# Patient Record
Sex: Male | Born: 1960 | Race: White | Hispanic: No | Smoking: Never smoker
Health system: Southern US, Community
[De-identification: ages and names within clinical notes are randomized; demographics above are authoritative.]

## PROBLEM LIST (undated history)

## (undated) DIAGNOSIS — J189 Pneumonia, unspecified organism: Secondary | ICD-10-CM

## (undated) DIAGNOSIS — Z930 Tracheostomy status: Secondary | ICD-10-CM

## (undated) DIAGNOSIS — E349 Endocrine disorder, unspecified: Secondary | ICD-10-CM

## (undated) DIAGNOSIS — N200 Calculus of kidney: Secondary | ICD-10-CM

## (undated) DIAGNOSIS — I480 Paroxysmal atrial fibrillation: Secondary | ICD-10-CM

## (undated) DIAGNOSIS — N529 Male erectile dysfunction, unspecified: Secondary | ICD-10-CM

## (undated) DIAGNOSIS — D649 Anemia, unspecified: Secondary | ICD-10-CM

## (undated) DIAGNOSIS — M109 Gout, unspecified: Secondary | ICD-10-CM

## (undated) DIAGNOSIS — R591 Generalized enlarged lymph nodes: Secondary | ICD-10-CM

## (undated) DIAGNOSIS — G4733 Obstructive sleep apnea (adult) (pediatric): Secondary | ICD-10-CM

## (undated) HISTORY — DX: Morbid (severe) obesity due to excess calories: E66.01

## (undated) HISTORY — DX: Generalized enlarged lymph nodes: R59.1

## (undated) HISTORY — PX: TONSILLECTOMY: SHX5217

## (undated) HISTORY — DX: Paroxysmal atrial fibrillation: I48.0

## (undated) HISTORY — DX: Pneumonia, unspecified organism: J18.9

## (undated) HISTORY — DX: Tracheostomy status: Z93.0

## (undated) HISTORY — DX: Male erectile dysfunction, unspecified: N52.9

## (undated) HISTORY — DX: Anemia, unspecified: D64.9

## (undated) HISTORY — DX: Obstructive sleep apnea (adult) (pediatric): G47.33

## (undated) HISTORY — DX: Gout, unspecified: M10.9

## (undated) HISTORY — PX: FINGER SURGERY: SHX640

## (undated) HISTORY — DX: Endocrine disorder, unspecified: E34.9

## (undated) HISTORY — PX: CYSTOSCOPY: SUR368

## (undated) HISTORY — PX: CHOLECYSTECTOMY: SHX55

## (undated) HISTORY — DX: Calculus of kidney: N20.0

---
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
## 1999-07-03 ENCOUNTER — Encounter: Admission: RE | Admit: 1999-07-03 | Discharge: 1999-10-01 | Payer: Self-pay | Admitting: *Deleted

## 1999-11-04 ENCOUNTER — Encounter: Payer: Self-pay | Admitting: Emergency Medicine

## 1999-11-04 ENCOUNTER — Emergency Department (HOSPITAL_COMMUNITY): Admission: EM | Admit: 1999-11-04 | Discharge: 1999-11-04 | Payer: Self-pay | Admitting: Emergency Medicine

## 2001-07-17 ENCOUNTER — Inpatient Hospital Stay (HOSPITAL_COMMUNITY): Admission: EM | Admit: 2001-07-17 | Discharge: 2001-07-27 | Payer: Self-pay | Admitting: Urology

## 2001-07-17 ENCOUNTER — Encounter: Payer: Self-pay | Admitting: Emergency Medicine

## 2001-07-17 ENCOUNTER — Encounter: Payer: Self-pay | Admitting: Urology

## 2001-07-18 ENCOUNTER — Encounter: Payer: Self-pay | Admitting: Urology

## 2001-07-21 ENCOUNTER — Encounter: Payer: Self-pay | Admitting: Urology

## 2001-07-22 ENCOUNTER — Encounter: Payer: Self-pay | Admitting: Urology

## 2001-07-23 ENCOUNTER — Encounter: Payer: Self-pay | Admitting: Urology

## 2001-07-24 ENCOUNTER — Encounter: Payer: Self-pay | Admitting: Pulmonary Disease

## 2001-07-25 ENCOUNTER — Encounter: Payer: Self-pay | Admitting: Pulmonary Disease

## 2002-05-31 ENCOUNTER — Emergency Department (HOSPITAL_COMMUNITY): Admission: EM | Admit: 2002-05-31 | Discharge: 2002-05-31 | Payer: Self-pay | Admitting: Emergency Medicine

## 2002-06-14 ENCOUNTER — Ambulatory Visit (HOSPITAL_BASED_OUTPATIENT_CLINIC_OR_DEPARTMENT_OTHER): Admission: RE | Admit: 2002-06-14 | Discharge: 2002-06-14 | Payer: Self-pay | Admitting: *Deleted

## 2003-04-09 HISTORY — PX: GASTRIC BYPASS: SHX52

## 2007-05-29 ENCOUNTER — Ambulatory Visit: Payer: Self-pay | Admitting: Family Medicine

## 2007-05-29 DIAGNOSIS — Z9884 Bariatric surgery status: Secondary | ICD-10-CM | POA: Insufficient documentation

## 2007-05-29 DIAGNOSIS — G4730 Sleep apnea, unspecified: Secondary | ICD-10-CM | POA: Insufficient documentation

## 2007-11-08 ENCOUNTER — Encounter: Payer: Self-pay | Admitting: Family Medicine

## 2007-11-19 ENCOUNTER — Telehealth: Payer: Self-pay | Admitting: Family Medicine

## 2007-11-25 ENCOUNTER — Encounter: Payer: Self-pay | Admitting: Family Medicine

## 2007-11-25 LAB — CONVERTED CEMR LAB
ALT: 16 units/L (ref 0–53)
AST: 15 units/L (ref 0–37)
Albumin: 4.2 g/dL (ref 3.5–5.2)
Alkaline Phosphatase: 90 units/L (ref 39–117)
BUN: 18 mg/dL (ref 6–23)
Basophils Absolute: 0 10*3/uL (ref 0.0–0.1)
Basophils Relative: 0 % (ref 0–1)
CO2: 23 meq/L (ref 19–32)
Calcium: 8.8 mg/dL (ref 8.4–10.5)
Chloride: 106 meq/L (ref 96–112)
Cholesterol: 147 mg/dL (ref 0–200)
Creatinine, Ser: 0.96 mg/dL (ref 0.40–1.50)
Eosinophils Absolute: 0.1 10*3/uL (ref 0.0–0.7)
Eosinophils Relative: 1 % (ref 0–5)
Folate: >20 ng/mL
Glucose, Bld: 121 mg/dL — ABNORMAL HIGH (ref 70–99)
HCT: 38.5 % — ABNORMAL LOW (ref 39.0–52.0)
HDL: 41 mg/dL (ref >39)
Hemoglobin: 11 g/dL — ABNORMAL LOW (ref 13.0–17.0)
LDL Cholesterol: 77 mg/dL (ref 0–99)
Lymphocytes Relative: 21 % (ref 12–46)
Lymphs Abs: 1.7 10*3/uL (ref 0.7–4.0)
MCHC: 28.6 g/dL — ABNORMAL LOW (ref 30.0–36.0)
MCV: 79.2 fL (ref 78.0–100.0)
Monocytes Absolute: 0.6 10*3/uL (ref 0.1–1.0)
Monocytes Relative: 8 % (ref 3–12)
Neutro Abs: 5.5 10*3/uL (ref 1.7–7.7)
Neutrophils Relative %: 70 % (ref 43–77)
Platelets: 214 10*3/uL (ref 150–400)
Potassium: 4.5 meq/L (ref 3.5–5.3)
RBC: 4.86 M/uL (ref 4.22–5.81)
RDW: 17.1 % — ABNORMAL HIGH (ref 11.5–15.5)
Sodium: 142 meq/L (ref 135–145)
TSH: 2.608 microintl units/mL (ref 0.350–4.50)
Total Bilirubin: 0.6 mg/dL (ref 0.3–1.2)
Total CHOL/HDL Ratio: 3.6
Total Protein: 6.8 g/dL (ref 6.0–8.3)
Triglycerides: 147 mg/dL (ref <150)
VLDL: 29 mg/dL (ref 0–40)
Vit D, 1,25-Dihydroxy: 10 — ABNORMAL LOW (ref 30–89)
Vitamin B-12: 317 pg/mL (ref 211–911)
WBC: 7.9 10*3/uL (ref 4.0–10.5)

## 2007-11-27 ENCOUNTER — Ambulatory Visit: Payer: Self-pay | Admitting: Family Medicine

## 2007-11-27 ENCOUNTER — Telehealth (INDEPENDENT_AMBULATORY_CARE_PROVIDER_SITE_OTHER): Payer: Self-pay | Admitting: *Deleted

## 2007-11-27 DIAGNOSIS — F528 Other sexual dysfunction not due to a substance or known physiological condition: Secondary | ICD-10-CM | POA: Insufficient documentation

## 2007-11-27 DIAGNOSIS — D649 Anemia, unspecified: Secondary | ICD-10-CM | POA: Insufficient documentation

## 2007-11-27 DIAGNOSIS — E559 Vitamin D deficiency, unspecified: Secondary | ICD-10-CM | POA: Insufficient documentation

## 2007-11-30 LAB — CONVERTED CEMR LAB
Iron: 43 ug/dL (ref 42–165)
Saturation Ratios: 10 % — ABNORMAL LOW (ref 20–55)
TIBC: 432 ug/dL (ref 215–435)
UIBC: 389 ug/dL

## 2007-12-02 ENCOUNTER — Encounter: Payer: Self-pay | Admitting: Family Medicine

## 2007-12-02 ENCOUNTER — Ambulatory Visit: Payer: Self-pay | Admitting: Cardiology

## 2007-12-04 LAB — CONVERTED CEMR LAB
Chlamydia, Swab/Urine, PCR: NEGATIVE
GC Probe Amp, Urine: NEGATIVE
Sex Hormone Binding: 27 nmol/L (ref 13–71)
Testosterone Free: 41.1 pg/mL — ABNORMAL LOW (ref 47.0–244.0)
Testosterone-% Free: 2.1 % (ref 1.6–2.9)
Testosterone: 193.44 ng/dL — ABNORMAL LOW (ref 350–890)

## 2007-12-09 ENCOUNTER — Encounter: Payer: Self-pay | Admitting: Family Medicine

## 2007-12-10 LAB — CONVERTED CEMR LAB: PSA: 0.76 ng/mL (ref 0.10–4.00)

## 2008-01-05 ENCOUNTER — Ambulatory Visit: Payer: Self-pay | Admitting: Family Medicine

## 2008-01-05 DIAGNOSIS — E291 Testicular hypofunction: Secondary | ICD-10-CM | POA: Insufficient documentation

## 2008-01-18 ENCOUNTER — Ambulatory Visit: Payer: Self-pay | Admitting: Family Medicine

## 2008-02-01 ENCOUNTER — Ambulatory Visit: Payer: Self-pay | Admitting: Family Medicine

## 2008-02-16 ENCOUNTER — Ambulatory Visit: Payer: Self-pay | Admitting: Family Medicine

## 2008-02-29 ENCOUNTER — Ambulatory Visit: Payer: Self-pay | Admitting: Family Medicine

## 2008-02-29 DIAGNOSIS — E118 Type 2 diabetes mellitus with unspecified complications: Secondary | ICD-10-CM | POA: Insufficient documentation

## 2008-03-01 ENCOUNTER — Ambulatory Visit: Payer: Self-pay | Admitting: Family Medicine

## 2008-03-04 LAB — CONVERTED CEMR LAB
Glucose, Bld: 149 mg/dL — ABNORMAL HIGH (ref 70–99)
Sex Hormone Binding: 32 nmol/L (ref 13–71)
Testosterone Free: 31.3 pg/mL — ABNORMAL LOW (ref 47.0–244.0)
Testosterone-% Free: 1.9 % (ref 1.6–2.9)
Testosterone: 163.77 ng/dL — ABNORMAL LOW (ref 350–890)

## 2008-03-06 ENCOUNTER — Encounter: Payer: Self-pay | Admitting: Family Medicine

## 2008-03-14 ENCOUNTER — Ambulatory Visit: Payer: Self-pay | Admitting: Family Medicine

## 2008-03-14 LAB — CONVERTED CEMR LAB
Blood Glucose, Fasting: 148 mg/dL
Hgb A1c MFr Bld: 6.4 %

## 2008-03-25 ENCOUNTER — Ambulatory Visit: Payer: Self-pay | Admitting: Family Medicine

## 2008-04-18 ENCOUNTER — Ambulatory Visit: Payer: Self-pay | Admitting: Family Medicine

## 2008-04-28 ENCOUNTER — Encounter: Payer: Self-pay | Admitting: Family Medicine

## 2008-05-02 ENCOUNTER — Ambulatory Visit: Payer: Self-pay | Admitting: Family Medicine

## 2008-05-03 ENCOUNTER — Telehealth (INDEPENDENT_AMBULATORY_CARE_PROVIDER_SITE_OTHER): Payer: Self-pay | Admitting: *Deleted

## 2008-05-27 ENCOUNTER — Ambulatory Visit: Payer: Self-pay | Admitting: Family Medicine

## 2008-05-27 ENCOUNTER — Telehealth: Payer: Self-pay | Admitting: Family Medicine

## 2008-05-27 DIAGNOSIS — M549 Dorsalgia, unspecified: Secondary | ICD-10-CM | POA: Insufficient documentation

## 2008-06-17 ENCOUNTER — Ambulatory Visit: Payer: Self-pay | Admitting: Family Medicine

## 2008-06-17 ENCOUNTER — Ambulatory Visit: Payer: Self-pay | Admitting: Physical Medicine & Rehabilitation

## 2008-06-20 ENCOUNTER — Ambulatory Visit: Payer: Self-pay | Admitting: Family Medicine

## 2008-06-20 LAB — CONVERTED CEMR LAB
Sex Hormone Binding: 27 nmol/L (ref 13–71)
Testosterone Free: 31.3 pg/mL — ABNORMAL LOW (ref 47.0–244.0)
Testosterone-% Free: 2.1 % (ref 1.6–2.9)
Testosterone: 149.59 ng/dL — ABNORMAL LOW (ref 350–890)

## 2008-07-07 ENCOUNTER — Telehealth: Payer: Self-pay | Admitting: Family Medicine

## 2008-07-21 ENCOUNTER — Telehealth: Payer: Self-pay | Admitting: Family Medicine

## 2008-07-21 ENCOUNTER — Ambulatory Visit: Payer: Self-pay | Admitting: Family Medicine

## 2008-08-30 ENCOUNTER — Ambulatory Visit: Payer: Self-pay | Admitting: Family Medicine

## 2008-09-08 ENCOUNTER — Encounter: Payer: Self-pay | Admitting: Family Medicine

## 2008-09-08 LAB — CONVERTED CEMR LAB
BUN: 17 mg/dL (ref 6–23)
CO2: 22 meq/L (ref 19–32)
Calcium: 9.3 mg/dL (ref 8.4–10.5)
Chloride: 107 meq/L (ref 96–112)
Creatinine, Ser: 1.25 mg/dL (ref 0.40–1.50)
Ferritin: 7 ng/mL — ABNORMAL LOW (ref 22–322)
Glucose, Bld: 106 mg/dL — ABNORMAL HIGH (ref 70–99)
HCT: 36.9 % — ABNORMAL LOW (ref 39.0–52.0)
Hemoglobin: 11.6 g/dL — ABNORMAL LOW (ref 13.0–17.0)
Iron: 41 ug/dL — ABNORMAL LOW (ref 42–165)
MCHC: 31.4 g/dL (ref 30.0–36.0)
MCV: 77.5 fL — ABNORMAL LOW (ref 78.0–100.0)
Magnesium: 1.7 mg/dL (ref 1.5–2.5)
Platelets: 232 10*3/uL (ref 150–400)
Potassium: 4.5 meq/L (ref 3.5–5.3)
RBC: 4.76 M/uL (ref 4.22–5.81)
RDW: 16.9 % — ABNORMAL HIGH (ref 11.5–15.5)
Saturation Ratios: 9 % — ABNORMAL LOW (ref 20–55)
Sodium: 142 meq/L (ref 135–145)
TIBC: 445 ug/dL — ABNORMAL HIGH (ref 215–435)
TSH: 1.842 microintl units/mL (ref 0.350–4.500)
UIBC: 404 ug/dL
Vitamin B-12: 359 pg/mL (ref 211–911)
WBC: 7.7 10*3/uL (ref 4.0–10.5)

## 2008-09-19 DIAGNOSIS — E669 Obesity, unspecified: Secondary | ICD-10-CM | POA: Insufficient documentation

## 2008-09-22 ENCOUNTER — Encounter (INDEPENDENT_AMBULATORY_CARE_PROVIDER_SITE_OTHER): Payer: Self-pay | Admitting: *Deleted

## 2008-10-24 ENCOUNTER — Encounter (INDEPENDENT_AMBULATORY_CARE_PROVIDER_SITE_OTHER): Payer: Self-pay | Admitting: *Deleted

## 2008-10-31 ENCOUNTER — Telehealth: Payer: Self-pay | Admitting: Family Medicine

## 2008-11-21 ENCOUNTER — Ambulatory Visit: Payer: Self-pay | Admitting: Family Medicine

## 2008-11-22 ENCOUNTER — Encounter: Payer: Self-pay | Admitting: Family Medicine

## 2008-11-30 ENCOUNTER — Encounter (INDEPENDENT_AMBULATORY_CARE_PROVIDER_SITE_OTHER): Payer: Self-pay | Admitting: *Deleted

## 2008-12-05 ENCOUNTER — Telehealth: Payer: Self-pay | Admitting: Family Medicine

## 2008-12-08 ENCOUNTER — Telehealth: Payer: Self-pay | Admitting: Family Medicine

## 2009-03-04 ENCOUNTER — Encounter: Admission: RE | Admit: 2009-03-04 | Discharge: 2009-03-04 | Payer: Self-pay | Admitting: Orthopedic Surgery

## 2009-04-03 ENCOUNTER — Telehealth: Payer: Self-pay | Admitting: Family Medicine

## 2009-04-04 ENCOUNTER — Ambulatory Visit: Payer: Self-pay | Admitting: Family Medicine

## 2009-05-31 ENCOUNTER — Ambulatory Visit: Payer: Self-pay | Admitting: Family Medicine

## 2009-06-01 LAB — CONVERTED CEMR LAB
ALT: 35 units/L (ref 0–53)
AST: 29 units/L (ref 0–37)
Albumin: 4.4 g/dL (ref 3.5–5.2)
Alkaline Phosphatase: 88 units/L (ref 39–117)
BUN: 23 mg/dL (ref 6–23)
CO2: 22 meq/L (ref 19–32)
Calcium: 9.1 mg/dL (ref 8.4–10.5)
Chloride: 106 meq/L (ref 96–112)
Cholesterol: 130 mg/dL (ref 0–200)
Creatinine, Ser: 1.25 mg/dL (ref 0.40–1.50)
Glucose, Bld: 181 mg/dL — ABNORMAL HIGH (ref 70–99)
HDL: 35 mg/dL — ABNORMAL LOW (ref >39)
LDL Cholesterol: 63 mg/dL (ref 0–99)
PSA: 0.9 ng/mL (ref 0.10–4.00)
Potassium: 4.3 meq/L (ref 3.5–5.3)
Sodium: 141 meq/L (ref 135–145)
Total Bilirubin: 0.7 mg/dL (ref 0.3–1.2)
Total CHOL/HDL Ratio: 3.7
Total Protein: 7.3 g/dL (ref 6.0–8.3)
Triglycerides: 161 mg/dL — ABNORMAL HIGH (ref <150)
VLDL: 32 mg/dL (ref 0–40)

## 2009-06-08 ENCOUNTER — Encounter: Payer: Self-pay | Admitting: Family Medicine

## 2009-06-21 ENCOUNTER — Ambulatory Visit: Payer: Self-pay | Admitting: Family Medicine

## 2009-06-21 LAB — CONVERTED CEMR LAB
Bilirubin Urine: NEGATIVE
Blood in Urine, dipstick: NEGATIVE
Glucose, Urine, Semiquant: NEGATIVE
Ketones, urine, test strip: NEGATIVE
Nitrite: NEGATIVE
Protein, U semiquant: NEGATIVE
Specific Gravity, Urine: 1.015
Urobilinogen, UA: 0.2
pH: 5.5

## 2010-02-01 ENCOUNTER — Ambulatory Visit: Payer: Self-pay | Admitting: Family Medicine

## 2010-02-01 LAB — CONVERTED CEMR LAB: Hgb A1c MFr Bld: 7.1 %

## 2010-05-02 ENCOUNTER — Telehealth: Payer: Self-pay | Admitting: Family Medicine

## 2010-05-02 ENCOUNTER — Encounter: Payer: Self-pay | Admitting: Family Medicine

## 2010-05-08 ENCOUNTER — Ambulatory Visit
Admission: RE | Admit: 2010-05-08 | Discharge: 2010-05-08 | Payer: Self-pay | Source: Home / Self Care | Attending: Family Medicine | Admitting: Family Medicine

## 2010-05-08 DIAGNOSIS — I4891 Unspecified atrial fibrillation: Secondary | ICD-10-CM | POA: Insufficient documentation

## 2010-05-08 DIAGNOSIS — R599 Enlarged lymph nodes, unspecified: Secondary | ICD-10-CM | POA: Insufficient documentation

## 2010-05-08 LAB — CONVERTED CEMR LAB: INR: 1.1

## 2010-05-08 NOTE — Progress Notes (Signed)
Summary: NO SHOWED FOR PAIN MANAGEMENT APPT  Phone Note From Other Clinic Call back at [phone]   Caller: MISTY Call For: DOCTOR Summary of Call: PATIENT NO SHOWED NEITHER CALLED TO CANCEL APPT TODAY AT [facility]. Initial call taken by: Harlene Salts,  July 07, 2008 3:23 PM  Follow-up for Phone Call        Lets call pt and remind him to reschedule his appt at the pain center.  Follow-up by: Nani Gasser MD,  July 07, 2008 3:34 PM  Additional Follow-up for Phone Call Additional follow up Details #1::        Reminded pt to followup with pain center Additional Follow-up by: Kathlene November,  July 11, 2008 8:52 AM

## 2010-05-08 NOTE — Assessment & Plan Note (Signed)
Summary: Pain Meds, Testosterone   Vital Signs:  Patient profile:   50 year old male Height:      69 inches Weight:      426 pounds BMI:     63.14 Pulse rate:   84 / minute BP sitting:   144 / 87  (left arm) Cuff size:   large  Vitals Entered By: Kathlene November (June 20, 2008 1:33 PM)  History of Present Illness: Discuss pain meds for his low back pain.  Feels the vicodin that has been on for one year with Dr. Rochele Pages is not working well any more.  Went to see Dr. Doroteo Bradford and felt he wasn't helpful.  He wanted to do injections.  Has tried  lidoderm patches in the patch and didnt seem to help.  Denies any euphoria with her pain medication. Would like a months supply.  Say has tried percocet when he broke his foot and from a friend and felt it worked much better to control his pain.  Uses [facility].    Current Medications (verified): 1)  Lexapro 20 Mg Tabs (Escitalopram Oxalate) .... Take 5 By Mouth Daily 2)  Vitamin D 60109 Unit Caps (Ergocalciferol) .... One By Mouth Once A Week Fo R6 Months 3)  Testosterone Cypionate 200 Mg/ml Oil (Testosterone Cypionate) .Marland Kitchen.. 1ml Im 4)  Ambien 10 Mg Tabs (Zolpidem Tartrate) .... 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 Then when ready with insurance then let me know and will change to a compounded pharmacy.  He will let me know when ready.    Problem # 2:  BACK PAIN (ICD-724.5) Called Dr, Sharion Settler office and they didn't prescribe any med for him. Also let him know I will give him some vicodin and refer to new pain managment. I will not take care of his chronic pain. Also let him know I wouldn't prescribe ocycodone.  His updated medication list for this problem includes:    Vicodin 5-500 Mg Tabs (Hydrocodone-acetaminophen) .Marland Kitchen... 1- 2 tabs every 4-6  hours as needed  Orders: Pain Clinic Referral (Pain)  Complete Medication List: 1)  Lexapro 20 Mg Tabs (Escitalopram oxalate) .... Take 5 by mouth daily 2)  Vitamin D 32355 Unit Caps (Ergocalciferol) .... One by mouth once a week fo r6 months 3)  Testosterone Cypionate 200 Mg/ml Oil (Testosterone cypionate) .Marland Kitchen.. 1ml im 4)  Ambien 10 Mg Tabs (Zolpidem tartrate) .... Take one tablet by mouth once a day at bedtime 5)  Voltaren 1 % Gel (Diclofenac sodium) .... Apply to low back area up to three times a day as needed for back pain. 6)  Vicodin 5-500 Mg Tabs (Hydrocodone-acetaminophen) .Marland Kitchen.. 1- 2 tabs every 4-6 hours as needed Prescriptions: VICODIN 5-500 MG TABS (HYDROCODONE-ACETAMINOPHEN) 1- 2 tabs every 4-6 hours as needed  #60 x 0   Entered and Authorized by:   Nani Gasser MD   Signed by:   Nani Gasser MD on 06/20/2008   Method used:   Printed then faxed to ...       [facility]* (retail)       [address] [place], [place]  [address]       Ph: [phone]       Fax: [phone]  RxID:   [id number] VOLTAREN 1 % GEL (DICLOFENAC SODIUM) Apply to low back area up to three times a day as needed for back pain.  #1 tube x 1   Entered and Authorized by:   Nani Gasser MD   Signed by:   Nani Gasser MD on 06/20/2008   Method used:   Electronically to        [facility]* (retail)       [address] [place], [place]  [address]       Ph: [phone]       Fax: [phone]   RxID:   [id number]

## 2010-05-08 NOTE — Letter (Signed)
Summary: Out of Work  [facility]  [address], [address]   [place], [place] [address]   Phone: [phone]  Fax: [phone]    May 02, 2008   Student:  VIVIANO BIR [facility] [employer]    To Whom It May Concern:   For Medical reasons, please allow the student above to park closer to his classes since he has a foot fracture. He is following up with the orthopedist later this week.   If you need additional information, please feel free to contact our office.         Sincerely,    Nani Gasser MD

## 2010-05-08 NOTE — Assessment & Plan Note (Signed)
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)    Route: IM    Site: L deltoid    Exp Date: 08/06/2008    Lot #: [id number]    Mfr: Claudette Laws    Patient tolerated injection without complications    Given by: Kathlene November (January 05, 2008 4:55 PM)  Orders Added: 1)  Testosterone Cypionat 200mg  ing [J1080] 2)  Admin of Therapeutic Inj  intramuscular or subcutaneous Lepidus.Putnam    ]  Medication Administration  Injection # 1:    Medication: Testosterone Cypionat 200mg  ing    Diagnosis: TESTOSTERONE 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

## 2010-05-08 NOTE — Assessment & Plan Note (Signed)
Summary: Test inj  Nurse Visit   Vitals Entered By: Harlene Salts (May 02, 2008 4:30 PM)                 Prior Medications: LEXAPRO 10 MG  TABS (ESCITALOPRAM OXALATE) Take one tablet by mouth once a day VITAMIN D 36644 UNIT CAPS (ERGOCALCIFEROL) one by mouth once a week fo r6 months GLUCOMETER () Also strips and lacents, #100. New Diagnosis. 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

## 2010-05-08 NOTE — Progress Notes (Signed)
Summary: Antibiotics  Phone Note Call from Patient Call back at Home Phone [phone]   Caller: Patient Call For: Nani Gasser MD Summary of Call: Pt was taking the antibiotics and had taken 7 days worth then dropped the rest in the toilet so wasn't able to finish the whole round of antibiotics. Says the place is doing ok but doesn't want it to come back cause he wasn't able to finish the course of treatment. Send to [facility] [place]- only needs 7 more days worth cause was on a 14 day course Initial call taken by: Kathlene November,  December 08, 2008 4:31 PM  Follow-up for Phone Call        OK, rx sent.  Follow-up by: Nani Gasser MD,  December 08, 2008 4:46 PM    New/Updated Medications: SULFAMETHOXAZOLE-TRIMETHOPRIM 800-160 MG/20ML SUSP (SULFAMETHOXAZOLE-TRIMETHOPRIM) Take 1 tablet by mouth two times a day for 7 day Prescriptions: SULFAMETHOXAZOLE-TRIMETHOPRIM 800-160 MG/20ML SUSP (SULFAMETHOXAZOLE-TRIMETHOPRIM) Take 1 tablet by mouth two times a day for 7 day  #14 x 0   Entered and Authorized by:   Nani Gasser MD   Signed by:   Nani Gasser MD on 12/08/2008   Method used:   Electronically to        [facility]* (retail)       [address] [place], [place]  [address]       Ph: [phone]       Fax: [phone]   RxID:   [id number]

## 2010-05-08 NOTE — Assessment & Plan Note (Signed)
Summary: Skin Abscess   Vital Signs:  Patient profile:   50 year old male Height:      69 inches Weight:      422 pounds Pulse rate:   89 / minute BP sitting:   141 / 90  (left arm) Cuff size:   large  Vitals Entered By: Kathlene November (November 21, 2008 4:18 PM) CC: few days ago raised area came up on right arm near anticubital space now red, endurated, swollen, painful, draining   Primary Care Provider:  Linford Arnold, c  CC:  few days ago raised area came up on right arm near anticubital space now red, endurated, swollen, painful, and draining.  History of Present Illness: Having HA adn temple pressure. Uses an antibacterial handwash.  Has acut on the inside of his nose. It gets irritated from the CPAP. Constantly rubbing his nose. Has been using triple antibiotic on it with no relief. Had + mrsa abscess last December. Now feels tired, urn down and his sinuses and templer are hurting. .   Current Medications (verified): 1)  Lexapro 20 Mg Tabs (Escitalopram Oxalate) .... Take 5 By Mouth Daily 2)  Testosterone 10% .Marland Kitchen.. 1ml Apply To The Skin Daily. Recheck Labs in 12 Weeks. 3)  Ambien 10 Mg Tabs (Zolpidem Tartrate) .... Take One Tablet By Mouth Once A Day At Bedtime 4)  Voltaren 1 % Gel (Diclofenac Sodium) .... Apply To Low Back Area Up To Three Times A Day As Needed For Back Pain. 5)  Vicodin 5-500 Mg Tabs (Hydrocodone-Acetaminophen) .Marland Kitchen.. 1- 2 Tabs Every 4-6 Hours As Needed  Allergies (verified): No Known Drug Allergies  Comments:  Nurse/Medical Assistant: The patient's medications and allergies were reviewed with the patient and were updated in the Medication and Allergy Lists. Kathlene November (November 21, 2008 4:20 PM)  Physical Exam  General:  Well-developed,well-nourished,in no acute distress; alert,appropriate and cooperative throughout examination Skin:  20 x 16 cm of erythema.    Impression & Recommendations:  Problem # 1:  ABSCESS, SKIN (ICD-682.9) Discussed treatment. Since  it is actively draining wont lance but keep an eye on it.  If area of redness increasesthen let me know if if starts having a fever.  Will send culture to confirm for MRSA.  His updated medication list for this problem includes:    Sulfamethoxazole-trimethoprim 800-160 Mg/55ml Susp (Sulfamethoxazole-trimethoprim) .Marland Kitchen... Take 1 tablet by mouth two times a day for 14 day  Orders: T-Culture, Wound ([id number])  Complete Medication List: 1)  Lexapro 20 Mg Tabs (Escitalopram oxalate) .... Take 5 by mouth daily 2)  Testosterone 10%  .Marland Kitchen.. 1ml apply to the skin daily. recheck labs in 12 weeks. 3)  Ambien 10 Mg Tabs (Zolpidem tartrate) .... Take one tablet by mouth once a day at bedtime 4)  Voltaren 1 % Gel (Diclofenac sodium) .... Apply to low back area up to three times a day as needed for back pain. 5)  Vicodin 5-500 Mg Tabs (Hydrocodone-acetaminophen) .Marland Kitchen.. 1- 2 tabs every 4-6 hours as needed 6)  Sulfamethoxazole-trimethoprim 800-160 Mg/54ml Susp (Sulfamethoxazole-trimethoprim) .... Take 1 tablet by mouth two times a day for 14 day Prescriptions: SULFAMETHOXAZOLE-TRIMETHOPRIM 800-160 MG/20ML SUSP (SULFAMETHOXAZOLE-TRIMETHOPRIM) Take 1 tablet by mouth two times a day for 14 day  #28 x 0   Entered and Authorized by:   Nani Gasser MD   Signed by:   Nani Gasser MD on 11/21/2008   Method used:   Electronically to        [facility]* (  retail)       [address] [place], [place]  [address]       Ph: [phone]       Fax: [phone]   RxID:   [id number]

## 2010-05-08 NOTE — Assessment & Plan Note (Signed)
Summary: CPE   Vital Signs:  Patient profile:   50 year old male Height:      69 inches Weight:      427 pounds Pulse rate:   81 / minute BP sitting:   139 / 82  (left arm) Cuff size:   large  Vitals Entered By: Kathlene November (May 31, 2009 10:07 AM) CC: CPE   Primary Care Provider:  Linford Arnold, c  CC:  CPE.  History of Present Illness: Started a high protein diet and low carb.  Has lost 5 pounds.  Doing well overall. He is stil interested in surgery to remove his panus becuase of recurrent infections, abscess, tinea and pressure on her bladder and penis that makes urinating difficult.   has appt with cardiology next week so will hold off on EKG as Dr. Jens Som will likely do this.   Current Medications (verified): 1)  Lexapro 20 Mg Tabs (Escitalopram Oxalate) .... Take 5 By Mouth Daily 2)  Testosterone 10% .Marland Kitchen.. 1ml Apply To The Skin Daily. Recheck Labs in 12 Weeks. 3)  Ambien 10 Mg Tabs (Zolpidem Tartrate) .... Take One Tablet By Mouth Once A Day At Bedtime 4)  Voltaren 1 % Gel (Diclofenac Sodium) .... Apply To Low Back Area Up To Three Times A Day As Needed For Back Pain. 5)  Vicodin 5-500 Mg Tabs (Hydrocodone-Acetaminophen) .Marland Kitchen.. 1- 2 Tabs Every 4-6 Hours As Needed 6)  Mupirocin 2 % Oint (Mupirocin) .... Apply To Nares Two Times A Day For 1 Week Then Twice A Week For Maintenance.  Allergies (verified): No Known Drug Allergies  Comments:  Nurse/Medical Assistant: The patient's medications and allergies were reviewed with the patient and were updated in the Medication and Allergy Lists. Kathlene November (May 31, 2009 10:07 AM)  Past History:  Past Medical History:   OSA - CPAP- 17.5 cm water pressure.  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   WEars glasses.   Past Surgical History: Gastric Bypass Sep 15, 2003  Family History: Mother died Lung Ca- Heavy smoker Father died melanoma, cardiac aneurysm Mother, Father, Sister,  Brother with Hi chol, HTN Aunts with stroke.   Social History: ON full disability.  Separated from his wife.   Never Smoked Alcohol use-no Drug use-no Regular exercise-no, walking some. Drinks a pot of coffee a day.    Review of Systems       The patient complains of weight loss.  The patient denies anorexia, fever, weight gain, vision loss, decreased hearing, hoarseness, chest pain, syncope, dyspnea on exertion, peripheral edema, prolonged cough, headaches, hemoptysis, abdominal pain, melena, hematochezia, severe indigestion/heartburn, hematuria, incontinence, genital sores, muscle weakness, suspicious skin lesions, transient blindness, difficulty walking, depression, unusual weight change, abnormal bleeding, and enlarged lymph nodes.    Physical Exam  General:  Well-developed,well-nourished,in no acute distress; alert,appropriate and cooperative throughout examination Head:  Normocephalic and atraumatic without obvious abnormalities. No apparent alopecia or balding. Eyes:  No corneal or conjunctival inflammation noted. EOMI. Perrla.  Ears:  External ear exam shows no significant lesions or deformities.  Otoscopic examination reveals clear canals, tympanic membranes are intact bilaterally without bulging, retraction, inflammation or discharge. Hearing is grossly normal bilaterally. Nose:  External nasal examination shows no deformity or inflammation.  Mouth:  Oral mucosa and oropharynx without lesions or exudates.  Teeth in good repair. Neck:  No deformities, masses, or tenderness noted. Chest Wall:  No deformities, masses, tenderness or gynecomastia noted. Lungs:  Normal respiratory effort, chest  expands symmetrically. Lungs are clear to auscultation, no crackles or wheezes. Heart:  Normal rate and regular rhythm. S1 and S2 normal without gallop, murmur, click, rub or other extra sounds. Abdomen:  Bowel sounds positive,abdomen soft and non-tender without masses, organomegaly or hernias  noted. Rectal:  no external abnormalities.   Prostate:  Unable to feel the entire gland.   Msk:  No deformity or scoliosis noted of thoracic or lumbar spine.   Pulses:  R and L carotid,radial,dorsalis pedis and posterior tibial pulses are full and equal bilaterally Extremities:  No clubbing, cyanosis, edema, or deformity noted with normal full range of motion of all joints.   Neurologic:  No cranial nerve deficits noted. Station and gait are normal.DTRs are symmetrical throughout. Sensory, motor and coordinative functions appear intact. Skin:  Intact without suspicious lesions or rashes Cervical Nodes:  No lymphadenopathy noted Psych:  Cognition and judgment appear intact. Alert and cooperative with normal attention span and concentration. No apparent delusions, illusions, hallucinations   Impression & Recommendations:  Problem # 1:  PREVENTIVE HEALTH CARE (ICD-V70.0) Due for screening labs.  Wil add PSA to the labs since unable to perform a good DRE.  Still difficulty with urination.  Encouraged regular exercise and decrease in caffeine intake which is excessive Discussed scheduling for screening colonoscopy. He does have OSA.  Decline flu vaccines. Otherws are up to dae.  Orders: T-Comprehensive Metabolic Panel [id number]) T-Lipid Profile [id number]) T-PSA ([id number]) Hemoccult Guaiac-1 spec.(in office) ([id number])  Complete Medication List: 1)  Lexapro 20 Mg Tabs (Escitalopram oxalate) .... Take 5 by mouth daily 2)  Testosterone 10%  .Marland Kitchen.. 1ml apply to the skin daily. recheck labs in 12 weeks. 3)  Ambien 10 Mg Tabs (Zolpidem tartrate) .... Take one tablet by mouth once a day at bedtime 4)  Voltaren 1 % Gel (Diclofenac sodium) .... Apply to low back area up to three times a day as needed for back pain. 5)  Vicodin 5-500 Mg Tabs (Hydrocodone-acetaminophen) .Marland Kitchen.. 1- 2 tabs every 4-6 hours as needed 6)  Mupirocin 2 % Oint (Mupirocin) .... Apply to nares two times a day for 1 week then  twice a week for maintenance.  Other Orders: ENT Referral (ENT) Gastroenterology Referral (GI)  Contraindications/Deferment of Procedures/Staging:    Test/Procedure: FLU VAX    Reason for deferment: patient declined   Patient Instructions: 1)  It is important that you exercise reguarly at least 20 minutes 5 times a week. If you develop chest pain, have severe difficulty breathing, or feel very tired, stop exercising immediately and seek medical attention.  2)  Schedule a colonoscopy/ sigmoidoscopy to help detect colon cancer.  3)  Follow up end of March for diabetic follow-up

## 2010-05-08 NOTE — Letter (Signed)
Summary: Appointment - Missed  [facility], Main Office  [address]   [place], [place] [address]   Phone: [phone]  Fax: [phone]     September 22, 2008 MRN: [id number]   Rodney Bright [address] [place], [place]  [address]   Dear Rodney Bright,  Our records indicate you missed your appointment on  June 16,2010  with Dr. Jens Som It is very important that we reach you to reschedule this appointment. We look forward to participating in your health care needs. Please contact us at the number listed above at your earliest convenience to reschedule this appointment.     Sincerely,   Lorne Skeens  [facility] Scheduling Team

## 2010-05-08 NOTE — Letter (Signed)
Summary: Appointment - Reminder 2  [facility], Main Office  [address]   [place], [place] [address]   Phone: [phone]  Fax: [phone]     November 30, 2008 MRN: [id number]   GEROD CALIGIURI [address] [place], [place]  [address]   Dear Mr. BLUNCK,  Our records indicate that it is time to schedule a follow-up appointment.  Dr.Crenshaw recommended that you follow up with [place] in  August 2010. It is very important that we reach you to schedule this appointment. We look forward to participating in your health care needs. Please contact us at the number listed above at your earliest convenience to schedule your appointment.  If you are unable to make an appointment at this time, give [place] a call so we can update our records.     Sincerely,    Lorne Skeens  [facility] Scheduling Team

## 2010-05-08 NOTE — Assessment & Plan Note (Signed)
Summary: UTI, DM   Vital Signs:  Patient profile:   50 year old male Height:      69 inches Weight:      426 pounds Pulse rate:   73 / minute BP sitting:   129 / 77  (left arm) Cuff size:   large  Vitals Entered By: Kathlene November (June 21, 2009 11:06 AM) CC: urinary frequency, burning with urination, left flank pain for 1 week now   Primary Care Provider:  Linford Arnold, c  CC:  urinary frequency, burning with urination, and left flank pain for 1 week now.  History of Present Illness: urinary frequency, burning with urination, left flank pain for 1 week now.  No fever.   N/V/D.    Also wants to review his labs. He was veyr unhappy that we called and recommend restarting the metofromin. He says he didn't pick it up and instead has changed his diet to a high protien diet to try to lose wieght thoughhas only lost one pound.   Current Medications (verified): 1)  Lexapro 20 Mg Tabs (Escitalopram Oxalate) .... Take 5 By Mouth Daily 2)  Testosterone 10% .Marland Kitchen.. 1ml Apply To The Skin Daily. Recheck Labs in 12 Weeks. 3)  Ambien 10 Mg Tabs (Zolpidem Tartrate) .... Take One Tablet By Mouth Once A Day At Bedtime 4)  Voltaren 1 % Gel (Diclofenac Sodium) .... Apply To Low Back Area Up To Three Times A Day As Needed For Back Pain. 5)  Mupirocin 2 % Oint (Mupirocin) .... Apply To Nares Two Times A Day For 1 Week Then Twice A Week For Maintenance. 6)  Metformin Hcl 500 Mg Tabs (Metformin Hcl) .... Take 1 Tablet By Mouth Two Times A Day 7)  Accu-Chek Aviva  Kit (Blood Glucose Monitoring Suppl) .... Use Once Daily To Test Glucose Levels  Dx 250.00 8)  Accu-Chek Aviva  Strp (Glucose Blood) .... Use Once Daily To Test Glucose Levels 9)  Bd Ultra-Fine Lancets  Misc (Lancets) .... Use Once Daily  Allergies (verified): No Known Drug Allergies  Comments:  Nurse/Medical Assistant: The patient's medications and allergies were reviewed with the patient and were updated in the Medication and Allergy Lists. Kathlene November (June 21, 2009 11:07 AM)  Physical Exam  General:  Well-developed,well-nourished,in no acute distress; alert,appropriate and cooperative throughout examination Head:  Normocephalic and atraumatic without obvious abnormalities. No apparent alopecia or balding. Skin:  no rashes.   Psych:  Cognition and judgment appear intact. Alert and cooperative with normal attention span and concentration. No apparent delusions, illusions, hallucinations   Impression & Recommendations:  Problem # 1:  UTI (ICD-599.0)  His updated medication list for this problem includes:    Ciprofloxacin Hcl 500 Mg Tabs (Ciprofloxacin hcl) .Marland Kitchen... Take 1 tablet by mouth two times a day for 10 days  Encouraged to push clear liquids, get enough rest, and take acetaminophen as needed. To be seen in 10 days if no improvement, sooner if worse. If not better by the end of the week then please let me know and will check for STDs, GC and chlam. No blood on urine to indicate a stone, etc and clinical hx not fittin.   Orders: UA Dipstick w/o Micro (automated)  (81003)  Problem # 2:  DIABETES MELLITUS, CONTROLLED (ICD-250.00)  Will check A!C.  He really wants to work on his diet instead of taking the metformin so I will give him this opportunity and see him back in 1 months with his glucometer to  make sure sugars are well controlled.  Set goals for him.   The following medications were removed from the medication list:    Metformin Hcl 500 Mg Tabs (Metformin hcl) .Marland Kitchen... Take 1 tablet by mouth two times a day  Orders: Fingerstick (36416) Hgb A1C (32355DD)  Complete Medication List: 1)  Lexapro 20 Mg Tabs (Escitalopram oxalate) .... Take 5 by mouth daily 2)  Testosterone 10%  .Marland Kitchen.. 1ml apply to the skin daily. recheck labs in 12 weeks. 3)  Ambien 10 Mg Tabs (Zolpidem tartrate) .... Take one tablet by mouth once a day at bedtime 4)  Voltaren 1 % Gel (Diclofenac sodium) .... Apply to low back area up to three times a day  as needed for back pain. 5)  Mupirocin 2 % Oint (Mupirocin) .... Apply to nares two times a day for 1 week then twice a week for maintenance. 6)  Accu-chek Aviva Kit (Blood glucose monitoring suppl) .... Use once daily to test glucose levels  dx 250.00 7)  Accu-chek Aviva Strp (Glucose blood) .... Use once daily to test glucose levels 8)  Bd Ultra-fine Lancets Misc (Lancets) .... Use once daily 9)  Ciprofloxacin Hcl 500 Mg Tabs (Ciprofloxacin hcl) .... Take 1 tablet by mouth two times a day for 10 days  Patient Instructions: 1)  Goals:Fasting sugars less than 130, 2 hour post meal sugars less than 180.  2)  Please schedule a follow-up appointment in 1 month to recheck sugars. please bring in log book or meter for me to review.  Prescriptions: CIPROFLOXACIN HCL 500 MG TABS (CIPROFLOXACIN HCL) Take 1 tablet by mouth two times a day for 10 days  #20 x 0   Entered and Authorized by:   Nani Gasser MD   Signed by:   Nani Gasser MD on 06/21/2009   Method used:   Electronically to        [facility]* (retail)       [address] [place], [place]  [address]       Ph: [phone]       Fax: [phone]   RxID:   [id number]   Laboratory Results   Urine Tests  Date/Time Received: 06/21/2009 Date/Time Reported: 06/21/2009  Routine Urinalysis   Color: yellow Appearance: Clear Glucose: negative   (Normal Range: Negative) Bilirubin: negative   (Normal Range: Negative) Ketone: negative   (Normal Range: Negative) Spec. Gravity: 1.015   (Normal Range: 1.003-1.035) Blood: negative   (Normal Range: Negative) pH: 5.5   (Normal Range: 5.0-8.0) Protein: negative   (Normal Range: Negative) Urobilinogen: 0.2   (Normal Range: 0-1) Nitrite: negative   (Normal Range: Negative) Leukocyte Esterace: moderate   (Normal Range: Negative)

## 2010-05-08 NOTE — Assessment & Plan Note (Signed)
Summary: INJ TEST  Nurse Visit   Vitals Entered By: Kathlene November (February 16, 2008 3:54 PM)                 Prior Medications: LEXAPRO 10 MG  TABS (ESCITALOPRAM OXALATE) Take one tablet by mouth once a day VITAMIN D 04540 UNIT CAPS (ERGOCALCIFEROL) one by mouth once a week fo r6 months Current Allergies: No known allergies     Medication Administration  Injection # 1:    Medication: Testosterone Cypionat 200mg  ing    Diagnosis: TESTOSTERONE DEFICIENCY (ICD-257.2)    Route: IM    Site: R deltoid    Exp Date: 07/08/2010    Lot #: [id number]    Mfr: Claudette Laws    Comments: Testosterone 200mg  IM given    Patient tolerated injection without complications    Given by: Kathlene November (February 16, 2008 3:55 PM)  Orders Added: 1)  Admin of Therapeutic Inj  intramuscular or subcutaneous [96372] 2)  Testosterone Cypionat 200mg  ing Melodee.Spike    ]  Medication Administration  Injection # 1:    Medication: Testosterone Cypionat 200mg  ing    Diagnosis: TESTOSTERONE DEFICIENCY (ICD-257.2)    Route: IM    Site: R deltoid    Exp Date: 07/08/2010    Lot #: [id number]    Mfr: Claudette Laws    Comments: Testosterone 200mg  IM given    Patient tolerated injection without complications    Given by: Kathlene November (February 16, 2008 3:55 PM)  Orders Added: 1)  Admin of Therapeutic Inj  intramuscular or subcutaneous [96372] 2)  Testosterone Cypionat 200mg  ing [J1080]

## 2010-05-08 NOTE — Assessment & Plan Note (Signed)
Summary: INJ  Nurse Visit   Vitals Entered By: Kathlene November (March 01, 2008 9:53 AM)                 Prior Medications: LEXAPRO 10 MG  TABS (ESCITALOPRAM OXALATE) Take one tablet by mouth once a day VITAMIN D 04540 UNIT CAPS (ERGOCALCIFEROL) one by mouth once a week fo r6 months Current Allergies: No known allergies     Medication Administration  Injection # 1:    Medication: Testosterone Cypionat 200mg  ing    Diagnosis: TESTOSTERONE DEFICIENCY (ICD-257.2)    Route: IM    Site: R deltoid    Exp Date: 07/08/2010    Lot #: [id number]    Mfr: Claudette Laws    Patient tolerated injection without complications    Given by: Kathlene November (March 01, 2008 9:55 AM)  Orders Added: 1)  Testosterone Cypionat 200mg  ing [J1080] 2)  Admin of Therapeutic Inj  intramuscular or subcutaneous Lepidus.Putnam    ]  Medication Administration  Injection # 1:    Medication: Testosterone Cypionat 200mg  ing    Diagnosis: TESTOSTERONE DEFICIENCY (ICD-257.2)    Route: IM    Site: R deltoid    Exp Date: 07/08/2010    Lot #: [id number]    Mfr: Claudette Laws    Patient tolerated injection without complications    Given by: Kathlene November (March 01, 2008 9:55 AM)  Orders Added: 1)  Testosterone Cypionat 200mg  ing [J1080] 2)  Admin of Therapeutic Inj  intramuscular or subcutaneous [13086]

## 2010-05-08 NOTE — Letter (Signed)
Summary: Office Notes Dated 11-08-07 thru 04-28-08/[facility]  Office Notes Dated 11-08-07 thru 04-28-08/[facility]   Imported By: Lanelle Bal 06/01/2008 14:35:02  _____________________________________________________________________  External Attachment:    Type:   Image     Comment:   External Document

## 2010-05-08 NOTE — Assessment & Plan Note (Signed)
Summary: TEST INJ  Nurse Visit   Vitals Entered By: Kathlene November (January 18, 2008 4:37 PM)                 Prior Medications: LEXAPRO 10 MG  TABS (ESCITALOPRAM OXALATE) Take one tablet by mouth once a day VITAMIN D 46962 UNIT CAPS (ERGOCALCIFEROL) one by mouth once a week fo r6 months Current Allergies: No known allergies     Medication Administration  Injection # 1:    Medication: Testosterone Cypionat 200mg  ing    Diagnosis: TESTOSTERONE DEFICIENCY (ICD-257.2)    Route: IM    Site: R deltoid    Exp Date: 08/06/2008    Lot #: [id number]    Mfr: Claudette Laws    Patient tolerated injection without complications    Given by: Kathlene November (January 18, 2008 4:38 PM)  Orders Added: 1)  Testosterone Cypionat 200mg  ing [J1080] 2)  Admin of Therapeutic Inj  intramuscular or subcutaneous Lepidus.Putnam    ]  Medication Administration  Injection # 1:    Medication: Testosterone Cypionat 200mg  ing    Diagnosis: TESTOSTERONE DEFICIENCY (ICD-257.2)    Route: IM    Site: R deltoid    Exp Date: 08/06/2008    Lot #: [id number]    Mfr: Claudette Laws    Patient tolerated injection without complications    Given by: Kathlene November (January 18, 2008 4:38 PM)  Orders Added: 1)  Testosterone Cypionat 200mg  ing [J1080] 2)  Admin of Therapeutic Inj  intramuscular or subcutaneous [72536]

## 2010-05-08 NOTE — Assessment & Plan Note (Signed)
Summary: TESTOSTERONE SHOT  Nurse Visit   Vitals Entered By: Kathlene November (March 25, 2008 11:16 AM)                 Prior Medications: LEXAPRO 10 MG  TABS (ESCITALOPRAM OXALATE) Take one tablet by mouth once a day VITAMIN D 16109 UNIT CAPS (ERGOCALCIFEROL) one by mouth once a week fo r6 months GLUCOMETER () Also strips and lacents, #100. New Diagnosis. DOXYCYCLINE HYCLATE 100 MG CAPS (DOXYCYCLINE HYCLATE) Take 1 tablet by mouth two times a day for 10 days Current Allergies: No known allergies     Medication Administration  Injection # 1:    Medication: Testosterone Cypionat 200mg  ing    Diagnosis: TESTOSTERONE DEFICIENCY (ICD-257.2)    Route: IM    Site: LUOQ gluteus    Exp Date: 07/08/2010    Lot #: [id number]    Mfr: watson    Patient tolerated injection without complications    Given by: Kathlene November (March 25, 2008 11:17 AM)  Orders Added: 1)  Testosterone Cypionat 200mg  ing [J1080] 2)  Admin of Therapeutic Inj  intramuscular or subcutaneous Lepidus.Putnam    ]  Medication Administration  Injection # 1:    Medication: Testosterone Cypionat 200mg  ing    Diagnosis: TESTOSTERONE DEFICIENCY (ICD-257.2)    Route: IM    Site: LUOQ gluteus    Exp Date: 07/08/2010    Lot #: [id number]    Mfr: watson    Patient tolerated injection without complications    Given by: Kathlene November (March 25, 2008 11:17 AM)  Orders Added: 1)  Testosterone Cypionat 200mg  ing [J1080] 2)  Admin of Therapeutic Inj  intramuscular or subcutaneous [82956]

## 2010-05-08 NOTE — Letter (Signed)
Summary: Patient No Show/[facility]  Patient No Show/[facility]   Imported By: Lanelle Bal 06/19/2009 13:46:29  _____________________________________________________________________  External Attachment:    Type:   Image     Comment:   External Document

## 2010-05-08 NOTE — Assessment & Plan Note (Signed)
Summary: CPE   Vital Signs:  Patient profile:   50 year old male Height:      69 inches Weight:      422 pounds Pulse rate:   71 / minute BP sitting:   136 / 77  (left arm) Cuff size:   large  Vitals Entered By: Kathlene November (Aug 30, 2008 2:31 PM) CC: CPE Is Patient Diabetic? No Pain Assessment Patient in pain? no        History of Present Illness: Here for CPE. No specific complaints. Did wonder about getting his skin tags removed. No additional PMH. He is now on disability.   Current Problems (verified): 1)  Back Pain  (ICD-724.5) 2)  Abscess, Skin  (ICD-682.9) 3)  Diabetes Mellitus, Controlled  (ICD-250.00) 4)  Testosterone Deficiency  (ICD-257.2) 5)  Anemia  (ICD-285.9) 6)  Vitamin D Deficiency  (ICD-268.9) 7)  Preventive Health Care  (ICD-V70.0) 8)  Penile Pain  (ICD-607.89) 9)  Erectile Dysfunction  (ICD-302.72) 10)  Morbid Obesity  (ICD-278.01) 11)  Elevated Bp Reading Without Dx Hypertension  (ICD-796.2) 12)  Sleep Apnea  (ICD-780.57) 13)  Bariatric Surgery Status  (ICD-V45.86) 14)  Chest Pain Unspecified  (ICD-786.50) 15)  Health Maintenance Exam  (ICD-V70.0)  Current Medications (verified): 1)  Lexapro 20 Mg Tabs (Escitalopram Oxalate) .... Take 5 By Mouth Daily 2)  Vitamin D 16109 Unit Caps (Ergocalciferol) .... One By Mouth Once A Week Fo R6 Months 3)  Testosterone Cypionate 200 Mg/ml Oil (Testosterone Cypionate) .Marland Kitchen.. 1ml Im 4)  Ambien 10 Mg Tabs (Zolpidem Tartrate) .... Take One Tablet By Mouth Once A Day At Bedtime 5)  Voltaren 1 % Gel (Diclofenac Sodium) .... Apply To Low Back Area Up To Three Times A Day As Needed For Back Pain. 6)  Vicodin 5-500 Mg Tabs (Hydrocodone-Acetaminophen) .Marland Kitchen.. 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  (05/29/2007)  Past Medical History:    Reviewed history from 03/14/2008 and no changes required:    CPAP- 17.5 cm pressure.     Gastri Bypass 2005, 600lbs before his bypass    Hx of kidney stones    WEars glasses.   Family History:    Reviewed history from 05/29/2007 and no changes required:       Mother died Lung Ca- Heavy smoker       Father died melanoma       Mother, Father, Sister, Brother with Hi chol, HTN       Aunts with stroke.   Social History:    ON full disability.  Separated from his wife.      Never Smoked    Alcohol use-no    Drug use-no    Regular exercise-no  Physical Exam  General:  Well-developed,well-nourished,in no acute distress; alert,appropriate and cooperative throughout examination Head:  Normocephalic and atraumatic without obvious abnormalities. No apparent alopecia or balding. Eyes:  No corneal or conjunctival inflammation noted. EOMI. Perrla.  Ears:  External ear exam shows no significant lesions or deformities.  Otoscopic examination reveals clear canals, tympanic membranes are intact bilaterally without bulging, retraction, inflammation or discharge. Hearing is grossly normal bilaterally. Nose:  External nasal examination shows no  deformity or inflammation.  Mouth:  Oral mucosa and oropharynx without lesions or exudates.  Teeth in good repair. Neck:  No deformities, masses, or tenderness noted. Chest Wall:  No deformities, masses, tenderness or gynecomastia noted. Breasts:  gynecomastia.   Lungs:  Normal respiratory effort, chest expands symmetrically. Lungs are clear to auscultation, no crackles or wheezes. Heart:  Normal rate and regular rhythm. S1 and S2 normal without gallop, murmur, click, rub or other extra sounds. Abdomen:  Bowel sounds positive,abdomen soft and non-tender without masses, organomegaly or hernias noted. Msk:  No deformity or scoliosis noted of thoracic or lumbar spine.    Pulses:  R and L carotid,radial,dorsalis pedis and posterior tibial pulses are full and equal bilaterally Extremities:  No clubbing, cyanosis, edema, or deformity noted with normal full range of motion of all joints.   Neurologic:  No cranial nerve deficits noted. Station and gait are normal. Plantar reflexes are down-going bilaterally. DTRs are symmetrical throughout. Sensory, motor and coordinative functions appear intact. Skin:  no rashes.   Cervical Nodes:  No lymphadenopathy noted Psych:  Cognition and judgment appear intact. Alert and cooperative with normal attention span and concentration. No apparent delusions, illusions, hallucinations   Impression & Recommendations:  Problem # 1:  PREVENTIVE HEALTH CARE (ICD-V70.0)  Tdap updated today. PSA is up to date. Need to recheck gluicose. Was elevated on last set of labs. Due for colonoscoyp will refer. Also needs additional testing since he is status post bypass. Encourage more regular activity.   Orders: T-CBC No Diff ([id number]) T-Basic Metabolic Panel (95093-26712) T-TSH ([id number]) Gastroenterology Referral (GI)  Problem # 2:  VITAMIN D DEFICIENCY (ICD-268.9) Change to OTC vitamin D 1000 International Units once a day.   Problem # 3:  TESTOSTERONE DEFICIENCY (ICD-257.2) Gve injection today. Will call for cost on compounding.   Orders: Testosterone Cypionat 200mg  ing (J1080) Admin of Therapeutic Inj  intramuscular or subcutaneous (25053)  Complete Medication List: 1)  Lexapro 20 Mg Tabs (Escitalopram oxalate) .... Take 5 by mouth daily 2)  Testosterone Cypionate 200 Mg/ml Oil (Testosterone cypionate) .Marland Kitchen.. 1ml im 3)  Ambien 10 Mg Tabs (Zolpidem tartrate) .... Take one tablet by mouth once a day at bedtime 4)  Voltaren 1 % Gel (Diclofenac sodium) .... Apply to low back area up to three times a day as needed for back pain. 5)  Vicodin 5-500 Mg Tabs (Hydrocodone-acetaminophen) .Marland Kitchen.. 1- 2 tabs every 4-6 hours as  needed  Other Orders: T-Vitamin B12 ([id number]) T-Magnesium (90240-97353) Tdap => 75yrs IM (29924) Admin 1st Vaccine (26834)  Patient Instructions: 1)   OTC vitamin D 1000 International Units once a day.       Immunizations Administered:  Tetanus Vaccine:    Vaccine Type: Tdap    Site: left deltoid    Mfr: GlaxoSmithKline    Dose: 0.5 ml    Route: IM    Given by: Kathlene November    Exp. Date: 06/01/2010    Lot #: [id number]    VIS given: 02/24/07 version given Aug 30, 2008.    Medication Administration  Injection # 1:    Medication: Testosterone Cypionat 200mg  ing    Diagnosis: TESTOSTERONE DEFICIENCY (ICD-257.2)    Route: IM    Site: LUOQ gluteus    Exp Date: 11/07/2010    Lot #: [id number]    Mfr: Pharmacia    Patient tolerated injection without complications    Given by: Kathlene November (Aug 30, 2008 3:05 PM)  Orders Added: 1)  T-CBC No  Diff [[id number]] 2)  T-Basic Metabolic Panel [id number] 3)  T-Vitamin B12 [82607-23330] 4)  T-TSH [09811-91478] 5)  T-Magnesium [29562-13086] 6)  Gastroenterology Referral [GI] 7)  Tdap => 8yrs IM [90715] 8)  Admin 1st Vaccine [90471] 9)  Testosterone Cypionat 200mg  ing [J1080] 10)  Admin of Therapeutic Inj  intramuscular or subcutaneous [96372] 11)  Est. Patient age 50-64 [62]   Appended Document: CPE     Review of Systems  The patient denies anorexia, fever, weight loss, weight gain, vision loss, decreased hearing, hoarseness, chest pain, syncope, dyspnea on exertion, peripheral edema, prolonged cough, headaches, hemoptysis, abdominal pain, melena, hematochezia, severe indigestion/heartburn, hematuria, incontinence, genital sores, muscle weakness, suspicious skin lesions, transient blindness, difficulty walking, depression, unusual weight change, enlarged lymph nodes, breast masses, and testicular masses.     Appended Document: CPE

## 2010-05-08 NOTE — Assessment & Plan Note (Signed)
Summary: Sinusitis, TEST, DM   Vital Signs:  Patient profile:   50 year old male Height:      69 inches Weight:      418 pounds Temp:     98.6 degrees F oral Pulse rate:   76 / minute BP sitting:   169 / 89  (right arm) Cuff size:   large  Vitals Entered By: Avon Gully CMA, Duncan Dull) (February 01, 2010 4:32 PM)  Serial Vital Signs/Assessments:  Time      Position  BP       Pulse  Resp  Temp     By 4:50 PM             141/94   77                    Andrea McCrimmon CMA, (AAMA)  CC: sinus pressure x 1 week   Primary Care Provider:  [place], c  CC:  sinus pressure x 1 week.  History of Present Illness: sinus pressure x 1 week.  Worse on the right.  + fatigued.  Chills/sweats.  Some blood. No nasal congestin. No cough or ST.  Some ear pain on that side.  No fever. Normal meds. Hx of sleep apnea. No cold meds.  Says has slowly felt more fatigued off of his testosteron. Has been out for a couple of months.     Current Medications (verified): 1)  Celexa 40 Mg Tabs (Citalopram Hydrobromide) .... Take One Tablet By Mouth Twice A Day 2)  Testosterone 10% .Marland Kitchen.. 1ml Apply To The Skin Daily. Recheck Labs in 12 Weeks. 3)  Ambien 10 Mg Tabs (Zolpidem Tartrate) .... Take One Tablet By Mouth Once A Day At Bedtime 4)  Voltaren 1 % Gel (Diclofenac Sodium) .... Apply To Low Back Area Up To Three Times A Day As Needed For Back Pain. 5)  Mupirocin 2 % Oint (Mupirocin) .... Apply To Nares Two Times A Day For 1 Week Then Twice A Week For Maintenance. 6)  Accu-Chek Aviva  Kit (Blood Glucose Monitoring Suppl) .... Use Once Daily To Test Glucose Levels  Dx 250.00 7)  Accu-Chek Aviva  Strp (Glucose Blood) .... Use Once Daily To Test Glucose Levels 8)  Bd Ultra-Fine Lancets  Misc (Lancets) .... Use Once Daily  Allergies (verified): No Known Drug Allergies  Comments:  Nurse/Medical Assistant: The patient's medications and allergies were reviewed with the patient and were updated in the Medication  and Allergy Lists. Avon Gully CMA, Duncan Dull) (February 01, 2010 4:34 PM)  Past History:  Past Surgical History: Last updated: 05/31/2009 Gastric Bypass 2005  Social History: Reviewed history from 05/31/2009 and no changes required. ON full disability.  Separated from his wife.   Never Smoked Alcohol use-no Drug use-no Regular exercise-no, walking some. Drinks a pot of coffee a day.    Physical Exam  General:  Well-developed,well-nourished,in no acute distress; alert,appropriate and cooperative throughout examination Head:  Normocephalic and atraumatic without obvious abnormalities. No apparent alopecia or balding. Tender over the right maillary sinus Eyes:  No corneal or conjunctival inflammation noted. EOMI. Perrla.  Ears:  External ear exam shows no significant lesions or deformities.  Otoscopic examination reveals clear canals, tympanic membranes are intact bilaterally without bulging, retraction, inflammation or discharge. Hearing is grossly normal bilaterally. Nose:  External nasal examination shows no deformity or inflammation. Nasal mucosa are pink and moist without lesions or exudates. Mouth:  Oral mucosa and oropharynx without lesions or exudates.  Teeth  in good repair. Neck:  No deformities, masses, or tenderness noted. Lungs:  Normal respiratory effort, chest expands symmetrically. Lungs are clear to auscultation, no crackles or wheezes. Heart:  Normal rate and regular rhythm. S1 and S2 normal without gallop, murmur, click, rub or other extra sounds. Skin:  no rashes.   Psych:  Cognition and judgment appear intact. Alert and cooperative with normal attention span and concentration. No apparent delusions, illusions, hallucinations   Impression & Recommendations:  Problem # 1:  SINUSITIS - ACUTE-NOS (ICD-461.9)  The following medications were removed from the medication list:    Ciprofloxacin Hcl 500 Mg Tabs (Ciprofloxacin hcl) .Marland Kitchen... Take 1 tablet by mouth two times a  day for 10 days His updated medication list for this problem includes:    Amoxicillin 875 Mg Tabs (Amoxicillin) .Marland Kitchen... Take 1 tablet by mouth two times a day for 10 days  Instructed on treatment. Call if symptoms persist or worsen.   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 Will refill and then in one month call and will send slipt to lab for test, PSA, and LFTs.   Problem # 3:  DIABETES MELLITUS, CONTROLLED (ICD-250.00)  He never started his metformin as he didn't want to be labeled a diabetic. Says this prior dx "followed him".   He has lost 8 lbs which is great.  A1C not at goal. He has agreed to start the metformin an df/u in one month.  Orders: Fingerstick (36416) Hemoglobin A1C (83036)  Complete Medication List: 1)  Celexa 40 Mg Tabs (Citalopram hydrobromide) .... Take one tablet by mouth twice a day 2)  Testosterone 10%  .Marland Kitchen.. 1ml apply to the skin daily. recheck labs in 12 weeks. 3)  Ambien 10 Mg Tabs (Zolpidem tartrate) .... Take one tablet by mouth once a day at bedtime 4)  Voltaren 1 % Gel (Diclofenac sodium) .... Apply to low back area up to three times a day as needed for back pain. 5)  Mupirocin 2 % Oint (Mupirocin) .... Apply to nares two times a day for 1 week then twice a week for maintenance. 6)  Accu-chek Aviva Kit (Blood glucose monitoring suppl) .... Use once daily to test glucose levels  dx 250.00 7)  Accu-chek Aviva Strp (Glucose blood) .... Use once daily to test glucose levels 8)  Bd Ultra-fine Lancets Misc (Lancets) .... Use once daily 9)  Amoxicillin 875 Mg Tabs (Amoxicillin) .... Take 1 tablet by mouth two times a day for 10 days Prescriptions: AMOXICILLIN 875 MG TABS (AMOXICILLIN) Take 1 tablet by mouth two times a day for 10 days  #20 x 0   Entered and Authorized by:   Nani Gasser MD   Signed by:   Nani Gasser MD on 02/01/2010   Method used:   Print then Give to Patient   RxID:   [id number] TESTOSTERONE 10%  1ml apply to the skin daily. Recheck labs in 12 weeks.  #30 day sup x 2   Entered and Authorized by:   Nani Gasser MD   Signed by:   Nani Gasser MD on 02/01/2010   Method used:   Printed then faxed to ...       [facility]* (retail)       [address] [place], [place]  [address]       Ph: [phone]       Fax: [phone]   RxID:   [id number]  Orders Added: 1)  Fingerstick [36416] 2)  Hemoglobin A1C [83036] 3)  Est. Patient Level IV [57322]    Laboratory Results   Blood Tests   Date/Time Received: 02/01/10 Date/Time Reported: 02/01/10  HGBA1C: 7.1%   (Normal Range: Non-Diabetic - 3-6%   Control Diabetic - 6-8%)

## 2010-05-08 NOTE — Progress Notes (Signed)
Summary: MRSA again  Phone Note Call from Patient Call back at Home Phone [phone]   Caller: Patient Summary of Call: has ? MRSA area where the last one was and was told to call has appt. tomorrow am. Uses [facility] in [place] Would like to go ahead and get started on antibiotics if possible Initial call taken by: Kathlene November,  April 03, 2009 12:46 PM  Follow-up for Phone Call        No, keep aptt for tomorrow.  Follow-up by: Nani Gasser MD,  April 03, 2009 12:57 PM  Additional Follow-up for Phone Call Additional follow up Details #1::        Pt notified to keep appointment Additional Follow-up by: Kathlene November,  April 03, 2009 12:59 PM

## 2010-05-08 NOTE — Progress Notes (Signed)
Summary: Pain med  Phone Note Call from Patient Call back at Home Phone [phone]   Caller: Patient Call For: Nani Gasser MD Summary of Call: Pt came in office for injections and asked what you wanted to do about his pain meds and if you wanted him to be referred to pain clinic and if you had received records from Dr. Jacinta Shoe office. Informed pt would be back on Monday and would address at that time Initial call taken by: Kathlene November,  May 27, 2008 2:41 PM  Follow-up for Phone Call        I don't have the records. will refer to pain clinic.  Follow-up by: Nani Gasser MD,  May 30, 2008 8:01 AM  Additional Follow-up for Phone Call Additional follow up Details #1::        Faxed referral to pain clinic. They will schedule appt. Additional Follow-up by: Kathlene November,  May 30, 2008 8:24 AM  New Problems: BACK PAIN (ICD-724.5)   New Problems: BACK PAIN (ICD-724.5)

## 2010-05-08 NOTE — Progress Notes (Signed)
Summary: Pain managment  ---- Converted from flag ---- ---- 07/21/2008 4:27 PM, Harlene Salts wrote: patient has insurance straightend out now and would like to proceed with compounded testosterone now. patient also said he would like to have you manage his pain meds per job since workers comp will not pay for him to see pain management. nurse informed him to get OV to discuss.LM ------------------------------    I don't do chronic pain managment. Can see Pain specialist as previously discussed (should have appt with Dr. Oneal Grout.  Also can change to compouned testosterone. Need to recheck levels in 2 weeks and then will change to compounded.  April 15, 20104:44 PM Metheney MD, Santina Evans    I started to inform Pt of the above and before I finished the first sentence Pt said, " I appreciate it" and then hung up on me. Arvilla Market CMA, Michelle July 22, 2008 1:28 PM

## 2010-05-08 NOTE — Progress Notes (Signed)
Summary: LAB ADD ON  ---- Converted from flag ---- ---- 11/27/2007 2:48 PM, Nani Gasser MD wrote: Call [facility]: Have then add an iron, tibc.  Dx anemia. ------------------------------  CALLED [facility] AND ADDED ON LABS.LM Phone Note Outgoing Call   Call placed by: Harlene Salts,  November 27, 2007 4:25 PM Call placed to: [facility]

## 2010-05-08 NOTE — Assessment & Plan Note (Signed)
Summary: INJ  Nurse Visit   Vitals Entered By: Kathlene November (February 02, 2008 11:23 AM)                 Prior Medications: LEXAPRO 10 MG  TABS (ESCITALOPRAM OXALATE) Take one tablet by mouth once a day VITAMIN D 81191 UNIT CAPS (ERGOCALCIFEROL) one by mouth once a week fo r6 months Current Allergies: No known allergies     Medication Administration  Injection # 1:    Medication: Testosterone Cypionat 200mg  ing    Diagnosis: TESTOSTERONE DEFICIENCY (ICD-257.2)    Route: IM    Site: R deltoid    Exp Date: 07/08/2010    Lot #: [id number]    Mfr: Claudette Laws    Patient tolerated injection without complications    Given by: Kathlene November (February 02, 2008 11:24 AM)  Orders Added: 1)  Testosterone Cypionat 200mg  ing [J1080] 2)  Admin of Therapeutic Inj  intramuscular or subcutaneous Lepidus.Putnam    ]  Medication Administration  Injection # 1:    Medication: Testosterone Cypionat 200mg  ing    Diagnosis: TESTOSTERONE DEFICIENCY (ICD-257.2)    Route: IM    Site: R deltoid    Exp Date: 07/08/2010    Lot #: [id number]    Mfr: Claudette Laws    Patient tolerated injection without complications    Given by: Kathlene November (February 02, 2008 11:24 AM)  Orders Added: 1)  Testosterone Cypionat 200mg  ing [J1080] 2)  Admin of Therapeutic Inj  intramuscular or subcutaneous [78469]

## 2010-05-08 NOTE — Assessment & Plan Note (Signed)
Summary: Fu MRSA Abscess, DM   Vital Signs:  Patient Profile:   50 Years Old Male Height:     69 inches Weight:      421.75 pounds Pulse rate:   81 / minute BP sitting:   160 / 99  (right arm) Cuff size:   large  Vitals Entered By: Kathlene November (March 14, 2008 1:43 PM)                  PCP:  Linford Arnold, c  Chief Complaint:  Went to [facility] 03/06/08 for a high feverfound out he had MRSA- wanted to go over some lab work .  History of Present Illness: Went to [facility] 03/06/08 for a high fever. Felt tired.  Also had a abscess,  found out he had MRSA. Had an I&D and give doxycycline.  Still havig a hard time urinating because of excessive skin hanginbg post weight loss surgery.  Constantly getting rashes between the folds.  Says the abscess if about half the size it was . Still very tender nad painful but better. No longer activity draining. Sounds like they did use some packing on the wound but he says he fell out the same day.   Wanted to go over some lab work for elevated glucose.      Current Allergies: No known allergies   Past Medical History:    CPAP- 17.5 cm pressure.     Gastri Bypass 2005, 600lbs before his bypass    Hx of kidney stones    WEars glasses.       Physical Exam  General:     Well-developed,well-nourished,in no acute distress; alert,appropriate and cooperative throughout examination Head:     Normocephalic and atraumatic without obvious abnormalities. No apparent alopecia or balding. Lungs:     Normal respiratory effort, chest expands symmetrically. Lungs are clear to auscultation, no crackles or wheezes. Heart:     Normal rate and regular rhythm. S1 and S2 normal without gallop, murmur, click, rub or other extra sounds. Skin:     Still has a 7-8 cm indurated lesion in the center of his pannus with some granulation tissue in the center. No active drainage.     Impression & Recommendations:  Problem # 1:  ABSCESS, SKIN  (ICD-682.9) Discussed if not continuing to resolve recommend return for I&D. Since still so large will continue doxy for 10 more days.  His updated medication list for this problem includes:    Doxycycline Hyclate 100 Mg Caps (Doxycycline hyclate) .Marland Kitchen... Take 1 tablet by mouth two times a day for 10 days   Problem # 2:  DIABETES MELLITUS, CONTROLLED (ICD-250.00) HAd DM before his bypasss surgery. Discussed that it has returned. CBG was still over 125 today. Rx for glucommeter given today. REcommend check fastings 3 days a week for 3 weeks and then fu. May need to be started back on Metformin. He has an active infection right now so didn't start on med today as it may be contributing to his elevated CBGs.  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 Labs results reviewed. Orders:  Testosterone Cypionat 200mg  ing (J1080) Admin of Therapeutic Inj  intramuscular or subcutaneous (82956)  Orders: Testosterone Cypionat 200mg  ing (J1080) Admin of Therapeutic Inj  intramuscular or subcutaneous (21308)   Complete Medication List: 1)  Lexapro 10 Mg Tabs (Escitalopram oxalate) .... Take one tablet by mouth  once a day 2)  Vitamin D 98119 Unit Caps (Ergocalciferol) .... One by mouth once a week fo r6 months 3)  Glucometer  .... Also strips and lacents, #100. new diagnosis. 4)  Doxycycline Hyclate 100 Mg Caps (Doxycycline hyclate) .... Take 1 tablet by mouth two times a day for 10 days  Other Orders: Capillary Blood Glucose (82948) Fingerstick (36416) Hemoglobin A1C (14782)    Prescriptions: DOXYCYCLINE HYCLATE 100 MG CAPS (DOXYCYCLINE HYCLATE) Take 1 tablet by mouth two times a day for 10 days  #20 x 0   Entered and Authorized by:   Nani Gasser MD   Signed by:   Nani Gasser MD on 03/14/2008   Method used:   Electronically to        [facility]* (retail)       [address]  [place], [place]  [address]       Ph: [phone]       Fax: [phone]   RxID:   [id number] GLUCOMETER Also strips and lacents, #100. New Diagnosis.  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 Patient Level III [30160]

## 2010-05-08 NOTE — Assessment & Plan Note (Signed)
Summary: CPE   Vital Signs:  Patient Profile:   50 Years Old Male Height:     69 inches Weight:      420 pounds BMI:     62.25 Pulse rate:   77 / minute BP sitting:   130 / 78  (left arm) Cuff size:   large  Vitals Entered By: Harlene Salts (November 27, 2007 11:07 AM)                  PCP:  Linford Arnold, c  Chief Complaint:  CPE.  History of Present Illness: Exam isnormal. No acute changes.  Does have some concern about sexual function. Has no difficulty with erection but having difficulty with the frequency of ejaculation. Has been able to ejaculate about 3 x a day until about 6 months ago. Says now maybe once a day. Says this is difficult because he has a new sexual partner. He says he was tested for GC/Chlam in the ED about 3 weeks ago because has also been having some burning in the shaft of the penis, that occurs not necessarily with or after urination.  Does have some urinary fequency but says also drinks alot and says this has not really changed recently.  No new medication. Does take Lexapro but has been on this for awhile.     Current Allergies: No known allergies   Past Medical History:    Reviewed history from 05/29/2007 and no changes required:       CPAP- 17.5 cm pressure.        Gastri Bypass 2005       Hx of kidney stones       WEars glasses.   Past Surgical History:    Reviewed history from 05/29/2007 and no changes required:       Gastric Bypass 2005   Family History:    Reviewed history from 05/29/2007 and no changes required:       Mother died Lung Ca- Heavy smoker       Father died melanoma       Mother, Father, Sister, Brother with Hi chol, HTN       Aunts with stroke.   Social History:    Reviewed history from 05/29/2007 and no changes required:       Production designer, theatre/television/film at [employer].  Separated from his wife.         Never Smoked       Alcohol use-no       Drug use-no       Regular exercise-no    Review of Systems  The patient denies  anorexia, fever, weight loss, weight gain, vision loss, decreased hearing, hoarseness, chest pain, syncope, dyspnea on exertion, peripheral edema, prolonged cough, headaches, hemoptysis, abdominal pain, melena, hematochezia, severe indigestion/heartburn, hematuria, incontinence, genital sores, muscle weakness, suspicious skin lesions, transient blindness, difficulty walking, depression, unusual weight change, abnormal bleeding, enlarged lymph nodes, and breast masses.     Physical Exam  General:     Well-developed,well-nourished,in no acute distress; alert,appropriate and cooperative throughout examination. Morbidlty obese.  Head:     Normocephalic and atraumatic without obvious abnormalities. No apparent alopecia or balding. Eyes:     No corneal or conjunctival inflammation noted. EOMI. Perrla. Ears:     External ear exam shows no significant lesions or deformities.  Otoscopic examination reveals clear canals, tympanic membranes are intact bilaterally without bulging, retraction, inflammation or discharge. Hearing is grossly normal bilaterally. Nose:     External nasal examination  shows no deformity or inflammation. Nasal mucosa are pink and moist without lesions or exudates. Mouth:     Oral mucosa and oropharynx without lesions or exudates.  Teeth in good repair. Neck:     No deformities, masses, or tenderness noted. Chest Wall:     No deformities, masses, tenderness or gynecomastia noted. Lungs:     Normal respiratory effort, chest expands symmetrically. Lungs are clear to auscultation, no crackles or wheezes. Heart:     Normal rate and regular rhythm. S1 and S2 normal without gallop, murmur, click, rub or other extra sounds. Abdomen:     Bowel sounds positive,abdomen soft and non-tender without masses, organomegaly or hernias noted. Msk:     No deformity or scoliosis noted of thoracic or lumbar spine.   Pulses:     R and L carotid,radial,dorsalis pedis and posterior tibial pulses  are full and equal bilaterally Extremities:     No clubbing, cyanosis, edema, or deformity noted with normal full range of motion of all joints.   Neurologic:     No cranial nerve deficits noted. Station and gait are normal. Sensory, motor and coordinative functions appear intact. Skin:     no rashes.   Cervical Nodes:     No lymphadenopathy noted Psych:     Cognition and judgment appear intact. Alert and cooperative with normal attention span and concentration. No apparent delusions, illusions, hallucinations    Impression & Recommendations:  Problem # 1:  PREVENTIVE HEALTH CARE (ICD-V70.0) Exam is normal except for obesity. Pt would like to start an exercise program and has some risk for MI so will set up for stress testing.  Reviewed his lab work. Does have elevated CBG. Recheck in 2-3 months fasting.  Also has vitamin D deficiency. Will start replacment therapy and recheck in 4-6 months.   Problem # 2:  PENILE PAIN (ICD-607.89) Rule out STD. Consider urethritis as the cause. No change in urinary sxs to suggest prostate problems.  Orders: T-Testosterone, Free and Total [phone]) T-Chlamydia & GC Probe, Urine ([phone])   Problem # 3:  ERECTILE DYSFUNCTION (ICD-302.72) Will test testosterone.  IF abnormla can consider testosterone therapy. Will need to check prostate before start any of those medication.  Orders: T-Testosterone, Free and Total [phone])   Problem # 4:  VITAMIN D DEFICIENCY (ICD-268.9) Start vitamin D weekly for 6 months. Then recheck in 4-6 months.   Problem # 5:  ANEMIA (ICD-285.9) No B12 or folat deficiency so will add iron to see if cause. Will need screening colonoscopy next spring at age 71.   Complete Medication List: 1)  Lexapro 10 Mg Tabs (Escitalopram oxalate) .... Take one tablet by mouth once a day 2)  Vitamin D 86578 Unit Caps (Ergocalciferol) .... One by mouth once a week fo r6 months  Other Orders: Cardiology  Referral (Cardiology)    Prescriptions: VITAMIN D 46962 UNIT CAPS (ERGOCALCIFEROL) one by mouth once a week fo r6 months  #4 x 5   Entered and Authorized by:   Nani Gasser MD   Signed by:   Nani Gasser MD on 11/27/2007   Method used:   Electronically to        [facility]* (retail)       [address] [place], [place]  [address]       Ph: [phone]       Fax: [phone]   RxID:   [id number]  ]

## 2010-05-08 NOTE — Assessment & Plan Note (Signed)
Summary: TEST INJ  Nurse Visit   Vitals Entered By: Harlene Salts (July 21, 2008 4:23 PM)   History of Present Illness: testosterone injection.     Complete Medication List: 1)  Lexapro 20 Mg Tabs (Escitalopram oxalate) .... Take 5 by mouth daily 2)  Vitamin D 16109 Unit Caps (Ergocalciferol) .... One by mouth once a week fo r6 months 3)  Testosterone Cypionate 200 Mg/ml Oil (Testosterone cypionate) .Marland Kitchen.. 1ml im 4)  Ambien 10 Mg Tabs (Zolpidem tartrate) .... Take one tablet by mouth once a day at bedtime 5)  Voltaren 1 % Gel (Diclofenac sodium) .... Apply to low back area up to three times a day as needed for back pain. 6)  Vicodin 5-500 Mg Tabs (Hydrocodone-acetaminophen) .Marland Kitchen.. 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.2)    Route: IM    Site: RUOQ gluteus    Exp Date: 10/07/2010    Lot #: [id number]    Mfr: Carroll Kinds    Comments: 1ml given    Patient tolerated injection without complications  Orders Added: 1)  Testosterone Cypionat 200mg  ing [J1080] 2)  Admin of Therapeutic Inj  intramuscular or subcutaneous Lepidus.Putnam    ]  Medication Administration  Injection # 1:    Medication: Testosterone Cypionat 200mg  ing    Diagnosis: TESTOSTERONE DEFICIENCY (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

## 2010-05-08 NOTE — Assessment & Plan Note (Signed)
Summary: INJ  Nurse Visit   Vitals Entered By: Harlene Salts (June 17, 2008 3:44 PM)   History of Present Illness: testosterone inj    Current Medications (verified): 1)  Lexapro 10 Mg  Tabs (Escitalopram Oxalate) .... Take One Tablet By Mouth Once A Day 2)  Vitamin D 16109 Unit Caps (Ergocalciferol) .... One By Mouth Once A Week Fo R6 Months 3)  Glucometer .... Also Strips and Lacents, #100. New Diagnosis. 4)  Doxycycline Hyclate 100 Mg Caps (Doxycycline Hyclate) .... Take 1 Tablet By Mouth Two Times A Day For 10 Days  Allergies: No Known Drug Allergies     Medication Administration  Injection # 1:    Medication: Testosterone Cypionat 200mg  ing    Diagnosis: TESTOSTERONE DEFICIENCY (ICD-257.2)    Route: IM    Site: LUOQ gluteus    Exp Date: 10/07/2010    Lot #: [id number]    Mfr: WATSON    Comments: GIVEN    Patient tolerated injection without complications  Orders Added: 1)  Testosterone Cypionat 200mg  ing [J1080] 2)  Admin of Therapeutic Inj  intramuscular or subcutaneous Lepidus.Putnam    ]

## 2010-05-08 NOTE — Assessment & Plan Note (Signed)
Summary: TESTO INJ  Nurse Visit   Vitals Entered By: Kathlene November (May 27, 2008 2:38 PM)                 Prior Medications: LEXAPRO 10 MG  TABS (ESCITALOPRAM OXALATE) Take one tablet by mouth once a day VITAMIN D 16109 UNIT CAPS (ERGOCALCIFEROL) one by mouth once a week fo r6 months GLUCOMETER () Also strips and lacents, #100. New Diagnosis. DOXYCYCLINE HYCLATE 100 MG CAPS (DOXYCYCLINE HYCLATE) Take 1 tablet by mouth two times a day for 10 days Current Allergies: No known allergies     Medication Administration  Injection # 1:    Medication: Testosterone Cypionat 200mg  ing    Diagnosis: TESTOSTERONE DEFICIENCY (ICD-257.2)    Route: IM    Site: RUOQ gluteus    Exp Date: 07/08/2010    Lot #: [id number]    Mfr: Claudette Laws    Patient tolerated injection without complications    Given by: Kathlene November (May 27, 2008 2:39 PM)  Orders Added: 1)  Testosterone Cypionat 200mg  ing [J1080] 2)  Admin of Therapeutic Inj  intramuscular or subcutaneous Lepidus.Putnam    ]  Medication Administration  Injection # 1:    Medication: Testosterone Cypionat 200mg  ing    Diagnosis: TESTOSTERONE DEFICIENCY (ICD-257.2)    Route: IM    Site: RUOQ gluteus    Exp Date: 07/08/2010    Lot #: [id number]    Mfr: Claudette Laws    Patient tolerated injection without complications    Given by: Kathlene November (May 27, 2008 2:39 PM)  Orders Added: 1)  Testosterone Cypionat 200mg  ing [J1080] 2)  Admin of Therapeutic Inj  intramuscular or subcutaneous [82956]

## 2010-05-08 NOTE — Progress Notes (Signed)
Summary: Needs Labs before OV  Phone Note Call from Patient Call back at Home Phone [phone]   Caller: Patient Call For: Nani Gasser MD Summary of Call: Pt would like to get lab work done before his appoinment for his PE on next Friday Initial call taken by: Kathlene November,  November 19, 2007 4:11 PM  Follow-up for Phone Call        Does he need post bariatric sx labs or is he still followed for this.  Follow-up by: Nani Gasser MD,  November 19, 2007 4:33 PM  Additional Follow-up for Phone Call Additional follow up Details #1::        Pt states has been 4 years since his surgery so what ever you think. KJ LPN Additional Follow-up by: Kathlene November,  November 19, 2007 4:40 PM        Appended Document: Needs Labs before OV

## 2010-05-08 NOTE — Assessment & Plan Note (Signed)
Summary: NOV; CPE   Vital Signs:  Patient Profile:   50 Years Old Male Height:     69 inches Weight:      428 pounds Pulse rate:   87 / minute BP sitting:   146 / 73  (left arm) Cuff size:   large  Vitals Entered By: Kathlene November (May 29, 2007 2:56 PM)                 PCP:  Linford Arnold, c  Chief Complaint:  NP. Sick earlier this week with N&V and diarrhea now just tired . Chest pain started early in the week. Wants to start an exercise program and last time he did they wanted to do an echo befogrehand due to his weight..  History of Present Illness: Chest pain intermittantly.  Last episdoe was yesterday.  Has been happening for months. Wants to start an exercise programbut has come in today to make sure it is OK to exercise.  Interested in taking a stress test.  Had one about 15 years ago that was normal.  Happens late at night.  Lasts 15-20 seconds. Rarely happens at work. On the left of side of chest . Describes as dull pain. Usually sits down and rests.  Occ heart flutters.  These are usually assoc with caffeine.   Had had gastric bypass was 4-5 years ago.  Has been on Lexapro for about a year and thinks this ay have contributed.  A high protein, low carb diet is what has worked for him in the past.  Happy with Lexapro as far as mood is concerned.  Sees a therapist at [facility] in [place].  Very happy with his care there. Sees Clifton Custard. Concerned because he has been slowly gaining his weight back.  He was over 600 lbs before his surgery.  Admits that he over eats.   Also has difficuly with urination sometimes because his pannus hangs over his penis. Has not had any infections but sometime urinates on self and sometimes has difficulty emptying.      Current Allergies: No known allergies   Past Medical History:    CPAP- 17.5 cm pressure.     Gastri Bypass 2005    Hx of kidney stones    WEars glasses.   Past Surgical History:    Gastric Bypass 2005   Family  History:    Mother died Lung Ca- Heavy smoker    Father died melanoma    Mother, Father, Sister, Brother with Hi chol, HTN    Aunts with stroke.   Social History:    Production designer, theatre/television/film at [employer].  Separated from his wife.      Never Smoked    Alcohol use-no    Drug use-no    Regular exercise-no   Risk Factors:  Tobacco use:  never Drug use:  no Caffeine use:  5+ drinks per day Alcohol use:  no Exercise:  no  Family History Risk Factors:    Family History of MI in females < 3 years old:  no    Family History of MI in males < 15 years old:  no   Review of Systems       No fever/sweats/weakness, unexplained weight loss/gain.  No vison changes.  No difficulty hearing/ringing in ears, hay fever/allergies.  + chest pain/discomfort, palpitations.  No Br lump/nipple discharge.  + cough/wheeze.  No blood in BM, nausea/vomiting/diarrhea.  No nighttime urination, leaking urine, unusual vaginal bleeding, discharge (penis or vagina).  +  muscle/joint pain. + rash, change in mole.  + HA.  No  memory loss.  No anxiety, sleep d/o, depression.  No easy bruising/bleeding, unexplained lump    Physical Exam  General:     Well-developed,obese,in no acute distress; alert,appropriate and cooperative throughout examination Head:     Normocephalic and atraumatic without obvious abnormalities. No apparent alopecia or balding. Eyes:     No corneal or conjunctival inflammation noted. EOMI. Perrla. . Ears:     External ear exam shows no significant lesions or deformities.  Otoscopic examination reveals clear canals, tympanic membranes are intact bilaterally without bulging, retraction, inflammation or discharge. Hearing is grossly normal bilaterally. Nose:     External nasal examination shows no deformity or inflammation. Nasal mucosa are pink and moist without lesions or exudates. Mouth:     Oral mucosa and oropharynx without lesions or exudates.  Teeth in good repair. Neck:     No deformities,  masses, or tenderness noted. Lungs:     Normal respiratory effort, chest expands symmetrically. Lungs are clear to auscultation, no crackles or wheezes. Heart:     Normal rate and regular rhythm. S1 and S2 normal without gallop, murmur, click, rub or other extra sounds.  No carotid bruits.  Abdomen:     soft and Obese.  No palpable organomegaly.  Old surgical scar well healed.  Msk:     No deformity or scoliosis noted of thoracic or lumbar spine.   Pulses:     R and L carotid,radial and posterior tibial pulses are full and equal bilaterally Extremities:     No clubbing, cyanosis, edema, or deformity noted with normal full range of motion of all joints.   Neurologic:     No cranial nerve deficits noted. Station and gait are normal. DTRs are symmetrical throughout. Sensory, motor and coordinative functions appear intact. Skin:     no rashes.  Several skin tags underneath his arms.  Cervical Nodes:     No lymphadenopathy noted Psych:     Cognition and judgment appear intact. Alert and cooperative with normal attention span and concentration. No apparent delusions, illusions, hallucinations    Impression & Recommendations:  Problem # 1:  HEALTH MAINTENANCE EXAM (ICD-V70.0) Exam is normal except for obesity.  Can start some light exercise like walking, If has any CP needs to stop immediately. Will set up stress test.     Problem # 2:  BARIATRIC SURGERY STATUS (ICD-V45.86) Never took his vitamin supplements like he was supposed to so will check these.  REcommend start with a MVI.   Orders: T-Folic Acid, Serum ([id number])   Problem # 3:  SLEEP APNEA (ICD-780.57) WEars CPAP- 17.5 cm pressure.   Problem # 4:  ELEVATED BP READING WITHOUT DX HYPERTENSION (ICD-796.2) REcheck in 2-3 weeks to confirm if elevated.   Problem # 5:  CHEST PAIN UNSPECIFIED (ICD-786.50) Atypical. EKG show NSR with rate 78 bpm and one PVC was captures.  Pt has had a fair amt of caffeine today. Will set up  fore stress test.  Fasting labs to risk stratify.  Orders: T-Comprehensive Metabolic Panel [phone]) T-TSH [phone]) T-CBC w/Diff [id number]) T-Lipid Profile [phone]) T-Vitamin B12 [phone]) T-Vitamin D (25-Hydroxy) [phone]) T- * Misc. Laboratory test [phone]) Cardiology Referral (Cardiology) EKG w/ Interpretation (93000)   Problem # 6:  MORBID OBESITY (ICD-278.01) If kidneys and lipids are OK then can restart a high protein diet which he says is the only diet that has worked for him in the past.  Complete Medication List: 1)  Lexapro 10 Mg Tabs (Escitalopram oxalate) .... Take one tablet by mouth once a day   Patient Instructions: 1)  Recheck BP in 2-3 weeks.     ]

## 2010-05-08 NOTE — Progress Notes (Signed)
Summary: question about a Rx  Phone Note Call from Patient   Caller: Patient Summary of Call: Dr.Tomio Kirk     Call Back  [phone]  Patient walked in the office today and he wants to talk to the nurse about a Rx that was gonna be called in at his last appt and he never heard anything. Initial call taken by: Vanessa Swaziland,  October 31, 2008 2:55 PM  Follow-up for Phone Call        Supposed to call in a certain med for the testosterone salve and hasn't heard anything from the man Follow-up by: Kathlene November,  October 31, 2008 3:25 PM  Additional Follow-up for Phone Call Additional follow up Details #1::        Call compounding pharm and see what is a good starting dose for someone on testorone IM inj 200mg  Q 3 weeks.  Additional Follow-up by: Nani Gasser MD,  October 31, 2008 3:28 PM    Additional Follow-up for Phone Call Additional follow up Details #2::    [facility]oke with [facility] and they said 10% testosterone at 1ml daily Follow-up by: Kathlene November,  October 31, 2008 3:45 PM  New/Updated Medications: * TESTOSTERONE 10% 1ml apply to the skin daily. Recheck labs in 12 weeks. Prescriptions: TESTOSTERONE 10% 1ml apply to the skin daily. Recheck labs in 12 weeks.  #30 day sup x 2   Entered and Authorized by:   Nani Gasser MD   Signed by:   Nani Gasser MD on 10/31/2008   Method used:   Printed then faxed to ...       [facility]* (retail)       [address] [place], [place]  [address]       Ph: [phone]       Fax: [phone]   RxID:   [id number]

## 2010-05-08 NOTE — Assessment & Plan Note (Signed)
Summary: INJ TEST  Nurse Visit   Vitals Entered By: Kathlene November (April 18, 2008 4:07 PM)                 Prior Medications: LEXAPRO 10 MG  TABS (ESCITALOPRAM OXALATE) Take one tablet by mouth once a day VITAMIN D 47829 UNIT CAPS (ERGOCALCIFEROL) one by mouth once a week fo r6 months GLUCOMETER () Also strips and lacents, #100. New Diagnosis. DOXYCYCLINE HYCLATE 100 MG CAPS (DOXYCYCLINE HYCLATE) Take 1 tablet by mouth two times a day for 10 days Current Allergies: No known allergies     Medication Administration  Injection # 1:    Medication: Testosterone Cypionat 200mg  ing    Diagnosis: TESTOSTERONE DEFICIENCY (ICD-257.2)    Route: IM    Site: RUOQ gluteus    Exp Date: 07/11/2010    Lot #: [id number]    Mfr: Claudette Laws    Patient tolerated injection without complications    Given by: Kathlene November (April 18, 2008 4:08 PM)  Orders Added: 1)  Testosterone Cypionat 200mg  ing [J1080] 2)  Admin of Therapeutic Inj  intramuscular or subcutaneous Lepidus.Putnam    ]  Medication Administration  Injection # 1:    Medication: Testosterone Cypionat 200mg  ing    Diagnosis: TESTOSTERONE DEFICIENCY (ICD-257.2)    Route: IM    Site: RUOQ gluteus    Exp Date: 07/11/2010    Lot #: [id number]    Mfr: Claudette Laws    Patient tolerated injection without complications    Given by: Kathlene November (April 18, 2008 4:08 PM)  Orders Added: 1)  Testosterone Cypionat 200mg  ing [J1080] 2)  Admin of Therapeutic Inj  intramuscular or subcutaneous [62952]

## 2010-05-08 NOTE — Assessment & Plan Note (Signed)
Summary: FU testosterone deficiency, insulin resistance.    Vital Signs:  Patient Profile:   50 Years Old Male Height:     69 inches Weight:      432 pounds Pulse rate:   84 / minute BP sitting:   145 / 90  (left arm) Cuff size:   large  Vitals Entered By: Kathlene November (February 29, 2008 4:12 PM)                 PCP:  Linford Arnold, c  Chief Complaint:  check up and needs labwork done.  History of Present Illness: Just drank a red bull.  Now BP is elevated.   Needs to have testosterone checked as still receiving injections.  Hopefully can transition him to the gel.  Says has noticed an increased the quantity of his ejaculate. Says he and his girlfriend are tring to get pregnant.     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  Hopefully can transition to the gel.  Keep eye on BP and avoid too much caffeine.  Orders: T-Testosterone, Free and Total ([phone])   Problem # 2:  VITAMIN D DEFICIENCY (ICD-268.9) REcheck in 3 months.   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 Will check in AM since not fasting this afternoon.  Orders: T-Glucose, Blood     ([id number])   Complete Medication List: 1)  Lexapro 10 Mg Tabs (Escitalopram oxalate) .... Take one tablet by mouth once a day 2)  Vitamin D 21308 Unit Caps (Ergocalciferol) .... One by mouth once a week fo r6 months    ]

## 2010-05-08 NOTE — Progress Notes (Signed)
Summary: ORTHOPEDIC APPT SCHEDULED  ---- Converted from flag ---- ---- 05/02/2008 4:46 PM, Ronie Spies wrote: Scheduled w/Dr. Margaretha Sheffield on 05/05/08 @ 2:15pm.  Patient is aware of appointment.  WB  ---- 05/02/2008 4:35 PM, Harlene Salts wrote: PLEASE CALL PATIENT TO SCHEDULE ORTHOPEDIC. SEEN AT [facility]. HE IS SIGNING A RELEASE SO WE CAN RECORDS.LM ------------------------------

## 2010-05-10 ENCOUNTER — Telehealth: Payer: Self-pay | Admitting: Family Medicine

## 2010-05-10 NOTE — Progress Notes (Signed)
Summary: Physician call  Phone Note From Other Clinic   Caller: Dr. Daleen Squibb   [phone] Call For: Dr. Linford Arnold Reason for Call: Medication Check Summary of Call: Per Dr. Vern Claude note, pt is taking Celexa 40 mg 5 tablet daily.  Advised him that per our chart he is only taking two tablets and it does not look like we are prescribing.  Please call Dr. Daleen Squibb to discuss this and a couple of other issues about this pt. Initial call taken by: Francee Piccolo CMA Duncan Dull),  May 02, 2010 4:53 PM  Follow-up for Phone Call        Has Afib and started on his coumadin.   Node seen on Chest CT 05-01-2010.  He will need to be followed here for his coumadin and willll need f/u of CT on chest. Not sure where the CT was done but will get the info from teh pt when he comes in .  Follow-up by: Nani Gasser MD,  May 03, 2010 7:41 AM

## 2010-05-15 ENCOUNTER — Encounter: Payer: Self-pay | Admitting: Family Medicine

## 2010-05-15 ENCOUNTER — Ambulatory Visit (INDEPENDENT_AMBULATORY_CARE_PROVIDER_SITE_OTHER): Payer: Self-pay

## 2010-05-15 DIAGNOSIS — Z7901 Long term (current) use of anticoagulants: Secondary | ICD-10-CM

## 2010-05-15 DIAGNOSIS — I4891 Unspecified atrial fibrillation: Secondary | ICD-10-CM

## 2010-05-15 LAB — CONVERTED CEMR LAB: INR: 1.3

## 2010-05-16 NOTE — Assessment & Plan Note (Signed)
Summary: New onset afib.    Vital Signs:  Patient profile:   50 year old male Height:      69 inches Weight:      410 pounds Pulse rate:   84 / minute BP sitting:   130 / 87  (right arm) Cuff size:   large  Vitals Entered By: Avon Gully CMA, Duncan Dull) (May 08, 2010 1:52 PM) CC: f/u hosp---afib seen in hosp last monday   Primary Care Provider:  Linford Arnold, c  CC:  f/u hosp---afib seen in hosp last monday.  History of Present Illness: Pt was hospitalized. 124 - 1/25 for new onset afib. Echo showed normal LVEF.  A1C was 7.8 there. Also noted with a enlarged mediastinal LN 2x2 cm on CT of chest. This willl need f/u in 3 months with repeat CT of chest. No SP or SOB since he has been home. Wants to know what vitamins he can take and would be safe with his warfarin. He says he is motivated to try to lose weight. .  Reviewed notes from hospital.   Anticoagulation Management History:      The patient is on coumadin and comes in today for a routine follow up visit.  Anticoagulation is being administered due to chronic atrial fibrillation.  Plans are to keep the patient on coumadin for life.  Today's INR is 1.1.    Diabetes Management History:      He says that he is not exercising regularly.     Current Medications (verified): 1)  Celexa 40 Mg Tabs (Citalopram Hydrobromide) .... Take One Tablet By Mouth Twice A Day 2)  Testosterone 10% .Marland Kitchen.. 1ml Apply To The Skin Daily. Recheck Labs in 12 Weeks. 3)  Ambien 10 Mg Tabs (Zolpidem Tartrate) .... Take One Tablet By Mouth Once A Day At Bedtime 4)  Voltaren 1 % Gel (Diclofenac Sodium) .... Apply To Low Back Area Up To Three Times A Day As Needed For Back Pain. 5)  Accu-Chek Aviva  Kit (Blood Glucose Monitoring Suppl) .... Use Once Daily To Test Glucose Levels  Dx 250.00 6)  Accu-Chek Aviva  Strp (Glucose Blood) .... Use Once Daily To Test Glucose Levels 7)  Bd Ultra-Fine Lancets  Misc (Lancets) .... Use Once Daily 8)  Cardizem 120 Mg Tabs  (Diltiazem Hcl) 9)  Warfarin Sodium 5 Mg Tabs (Warfarin Sodium)  Allergies (verified): No Known Drug Allergies  Comments:  Nurse/Medical Assistant: The patient's medications and allergies were reviewed with the patient and were updated in the Medication and Allergy Lists. Avon Gully CMA, Duncan Dull) (May 08, 2010 1:54 PM)  Past History:  Past Medical History: OSA - CPAP- 17.5 cm water pressure.  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   WEars glasses.  a-fib dx 04/2010  Physical Exam  General:  Well-developed,well-nourished,in no acute distress; alert,appropriate and cooperative throughout examination. Morbidly obese.  Head:  Normocephalic and atraumatic without obvious abnormalities. No apparent alopecia or balding. Lungs:  Normal respiratory effort, chest expands symmetrically. Lungs are clear to auscultation, no crackles or wheezes. Heart:  Normal rate and regular rhythm. S1 and S2 normal without gallop, murmur, click, rub or other extra sounds. No carotid bruits.  Pulses:  Radial 2+  Extremities:  No LE edema.  Psych:  Cognition and judgment appear intact. Alert and cooperative with normal attention span and concentration. No apparent delusions, illusions, hallucinations   Impression & Recommendations:  Problem # 1:  ATRIAL FIBRILLATION (ICD-427.31) Assessment New  Rate is controlled today. contiunue current regimen. We will monitor his coumadin. Adjusted as below Will get him a f/u appt with Dr. Jens Som.   His updated medication list for this problem includes:    Cartia Xt 120 Mg Xr24h-cap (Diltiazem hcl coated beads) .Marland Kitchen... Take 1 tablet by mouth once a day    Warfarin Sodium 5 Mg Tabs (Warfarin sodium) .Marland Kitchen... Take 1 tablet by mouth once a day    Coumadin 5 Mg Tabs (Warfarin sodium) ..... Sunday - 5 mg, monday - 5 mg, tuesday - 7.5 mg, wednesday - 5 mg, thursday - 7.5 mg, friday - 5 mg, saturday - 7.5  mg  Orders: Fingerstick ([address]109) Protime INR (60454) Cardiology Referral (Cardiology)  Problem # 2:  DIABETES MELLITUS, CONTROLLED (ICD-250.00) Assessment: Deteriorated Will start metformin and f/u in 1 months to check sugars.  Start checking sugar 3 x a week fasting in the AM.  His updated medication list for this problem includes:    Metformin Hcl 500 Mg Tabs (Metformin hcl) .Marland Kitchen... Take 1 tablet by mouth once a day in the am.  Problem # 3:  LYMPHADENOPATHY (ICD-785.6) Assessment: New Need to repeat chest CT in 3 months.  The following medications were removed from the medication list:    Amoxicillin 875 Mg Tabs (Amoxicillin) .Marland Kitchen... Take 1 tablet by mouth two times a day for 10 days  Complete Medication List: 1)  Celexa 40 Mg Tabs (Citalopram hydrobromide) .... Take one tablet by mouth twice a day 2)  Testosterone 10%  .Marland Kitchen.. 1ml apply to the skin daily. recheck labs in 12 weeks. 3)  Ambien 10 Mg Tabs (Zolpidem tartrate) .... Take one tablet by mouth once a day at bedtime 4)  Voltaren 1 % Gel (Diclofenac sodium) .... Apply to low back area up to three times a day as needed for back pain. 5)  Accu-chek Aviva Kit (Blood glucose monitoring suppl) .... Use once daily to test glucose levels  dx 250.00 6)  Accu-chek Aviva Strp (Glucose blood) .... Use once daily to test glucose levels 7)  Bd Ultra-fine Lancets Misc (Lancets) .... Use once daily 8)  Cartia Xt 120 Mg Xr24h-cap (Diltiazem hcl coated beads) .... Take 1 tablet by mouth once a day 9)  Warfarin Sodium 5 Mg Tabs (Warfarin sodium) .... Take 1 tablet by mouth once a day 10)  B Complex Vitamins Caps (B complex vitamins) .... Take 1 tablet by mouth once a day 11)  Metformin Hcl 500 Mg Tabs (Metformin hcl) .... Take 1 tablet by mouth once a day in the am. 12)  Coumadin 5 Mg Tabs (Warfarin sodium) .... Sunday - 5 mg, monday - 5 mg, tuesday - 7.5 mg, wednesday - 5 mg, thursday - 7.5 mg, friday - 5 mg, saturday - 7.5 mg  Anticoagulation  Management Assessment/Plan:      The target INR is 2.0-3.0.  He is to have a PT/INR in 1 week.  Anticoagulation instructions were given to patient.         Current Anticoagulation Instructions: The patient's dosage of coumadin will be increased.  The new dosage includes: Warfarin sodium 5 mg tabs: Take 1 tablet by mouth once a day Coumadin 5 mg tabs:  Sunday - 5 mg, Monday - 5 mg, Tuesday - 7.5 mg, Wednesday - 5 mg, Thursday - 7.5 mg, Friday - 5 mg, Saturday - 7.5 mg.  Repeat PT/INR in 1 week.    Patient Instructions: 1)  Start checking sugar 3 x a week  fasting in the AM.  2)  Start metformin once a day.   3)  Nurse visit to repeat INR in 1 week.  Prescriptions: METFORMIN HCL 500 MG TABS (METFORMIN HCL) Take 1 tablet by mouth once a day in the AM.  #30 x 1   Entered and Authorized by:   Nani Gasser MD   Signed by:   Nani Gasser MD on 05/08/2010   Method used:   Electronically to        [facility]* (retail)       [address] [place], [place]  [address]       Ph: [phone]       Fax: [phone]   RxID:   [id number]    Orders Added: 1)  Fingerstick [36416] 2)  Protime INR [85610] 3)  Cardiology Referral [Cardiology] 4)  Est. Patient Level IV [36644]         Laboratory Results   Blood Tests   Date/Time Received: 05/08/10 Date/Time Reported: 05/08/10   INR: 1.1   (Normal Range: 0.88-1.12   Therap INR: 2.0-3.5)

## 2010-05-16 NOTE — Progress Notes (Signed)
Summary: supplements  Phone Note Call from Patient   Caller: Patient Call For: Nani Gasser MD Summary of Call: Pt wants to know if he can take Ginko, and iron supplement Initial call taken by: Avon Gully CMA, Duncan Dull),  May 10, 2010 3:22 PM  Follow-up for Phone Call        [facility] for iron supplement. Gingko intereferes with teh coumadin so no on that one.  Follow-up by: Nani Gasser MD,  May 11, 2010 8:41 AM  Additional Follow-up for Phone Call Additional follow up Details #1::        left message on pt;s vm Additional Follow-up by: Avon Gully CMA, Duncan Dull),  May 11, 2010 8:49 AM

## 2010-05-24 NOTE — Letter (Signed)
Summary: [facility]   Imported By: Maryln Gottron 05/17/2010 12:15:45  _____________________________________________________________________  External Attachment:    Type:   Image     Comment:   External Document

## 2010-05-29 DIAGNOSIS — Z87442 Personal history of urinary calculi: Secondary | ICD-10-CM | POA: Insufficient documentation

## 2010-05-30 ENCOUNTER — Ambulatory Visit (INDEPENDENT_AMBULATORY_CARE_PROVIDER_SITE_OTHER): Payer: Self-pay | Admitting: Cardiology

## 2010-05-30 ENCOUNTER — Encounter: Payer: Self-pay | Admitting: Cardiology

## 2010-05-30 DIAGNOSIS — I10 Essential (primary) hypertension: Secondary | ICD-10-CM

## 2010-05-30 DIAGNOSIS — I4891 Unspecified atrial fibrillation: Secondary | ICD-10-CM

## 2010-05-30 NOTE — Letter (Signed)
Summary: [facility] Discharge Summary  [facility] Discharge Summary   Imported By: Kassie Mends 05/23/2010 09:13:08  _____________________________________________________________________  External Attachment:    Type:   Image     Comment:   External Document

## 2010-06-05 NOTE — Assessment & Plan Note (Signed)
Summary: new onset afib   Visit Type:  Initial Consult Primary Provider:  [place], c  CC:  A-fib.  History of Present Illness: 50 year old male who I saw in 2009 for dyspnea. Stress test was recommended but did not happen. Recently presented to hospital in [place] with dyspnea and found to be in atrial fibrillation with a rapid ventricular response. He converted to sinus rhythm. Based on outside records his LV function was normal on echocardiogram. Cardiac markers were negative. He has been treated with Cardizem and Coumadin and now presents for further management. The day the patient had atrial fibrillation his symptoms included dyspnea, palpitations and weakness. Since he was discharged he has some dyspnea on exertion which is a chronic issue but much improved. There is no orthopnea, PND, syncope, bleeding or chest pain. He occasionally feels a brief skip but no sustained palpitations.  Current Medications (verified): 1)  Celexa 40 Mg Tabs (Citalopram Hydrobromide) .... Take One Tablet By Mouth Twice A Day 2)  Testosterone 10% .Marland Kitchen.. 1ml Apply To The Skin Daily. Recheck Labs in 12 Weeks. 3)  Ambien 10 Mg Tabs (Zolpidem Tartrate) .... Take One Tablet By Mouth Once A Day At Bedtime 4)  Voltaren 1 % Gel (Diclofenac Sodium) .... Apply To Low Back Area Up To Three Times A Day As Needed For Back Pain. 5)  Accu-Chek Aviva  Kit (Blood Glucose Monitoring Suppl) .... Use Once Daily To Test Glucose Levels  Dx 250.00 6)  Accu-Chek Aviva  Strp (Glucose Blood) .... Use Once Daily To Test Glucose Levels 7)  Bd Ultra-Fine Lancets  Misc (Lancets) .... Use Once Daily 8)  Cartia Xt 120 Mg Xr24h-Cap (Diltiazem Hcl Coated Beads) .... Take 1 Tablet By Mouth Once A Day 9)  Warfarin Sodium 5 Mg Tabs (Warfarin Sodium) .... Take 1 Tablet By Mouth Once A Day 10)  B Complex Vitamins  Caps (B Complex Vitamins) .... Take 1 Tablet By Mouth Once A Day 11)  Metformin Hcl 500 Mg Tabs (Metformin Hcl) .... Take 1 Tablet By  Mouth Once A Day in The Am. 12)  Vicodin 5-500 Mg Tabs (Hydrocodone-Acetaminophen) .... As Needed 13)  Lorazepam 1 Mg Tabs (Lorazepam) .... As Needed  Allergies (verified): No Known Drug Allergies  Past History:  Past Medical History: ATRIAL FIBRILLATION LYMPHADENOPATHY  DIABETES MELLITUS, CONTROLLED TESTOSTERONE DEFICIENCY  ANEMIA  VITAMIN D DEFICIENCY  ERECTILE DYSFUNCTION  MORBID OBESITY  OSA - CPAP- 17.5 cm water pressure.  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   WEars glasses.   Past Surgical History: Gastric Bypass 2005 cholecystectomy tonsillectomy.  Repair  of ulnar digital nerve and artery right index finger. Cystoscopy, retrograde and double J catheter insertion.  Social History: Reviewed history from 05/31/2009 and no changes required. ON full disability.  Separated from his wife.   Never Smoked Alcohol use-no Drug use-no Regular exercise-no, walking some. Drinks a pot of coffee a day.    Review of Systems       no fevers or chills, productive cough, hemoptysis, dysphasia, odynophagia, melena, hematochezia, dysuria, hematuria, rash, seizure activity, orthopnea, PND, pedal edema, claudication. Remaining systems are negative.   Vital Signs:  Patient profile:   50 year old male Height:      69 inches Weight:      392 pounds BMI:     58.10 Pulse rate:   67 / minute Pulse rhythm:   regular Resp:     18 per minute BP sitting:  140 / 86  (left arm) Cuff size:   large  Vitals Entered By: Vikki Ports (May 30, 2010 2:14 PM)  Physical Exam  General:  Well-developed obese in no acute distress.  Skin is warm and dry.  HEENT is normal.  Neck is supple. No thyromegaly.  Chest is clear to auscultation with normal expansion.  Cardiovascular exam is regular rate and rhythm.  Abdominal exam difficult due to obesity, nontender or distended. No masses palpated. Extremities show trace edema. neuro grossly  intact    EKG  Procedure date:  05/30/2010  Findings:      Sinus rhythm at a rate of 92. Left axis deviation.  Impression & Recommendations:  Problem # 1:  ATRIAL FIBRILLATION (ICD-427.31) Patient has been diagnosed with paroxysmal atrial fibrillation. Echocardiogram showed normal LV function. He remains in sinus rhythm. Continue Cardizem for rate control if his atrial fibrillation recurs. Embolic risk factors of hypertension and diabetes. Continue Coumadin with goal INR 2-3. Check TSH. Schedule stress echocardiogram.  The following medications were removed from the medication list:    Coumadin 5 Mg Tabs (Warfarin sodium) ..... Sunday - 7.5 mg, monday - 5 mg, tuesday - 7.5 mg, wednesday - 5 mg, thursday - 7.5 mg, friday - 7.5 mg, saturday - 7.5 mg    Coumadin 7.5 Mg Tabs (Warfarin sodium) ..... Sunday - 7.5 mg, monday - 5 mg, tuesday - 7.5 mg, wednesday - 5 mg, thursday - 7.5 mg, friday - 7.5 mg, saturday - 7.5 mg His updated medication list for this problem includes:    Warfarin Sodium 5 Mg Tabs (Warfarin sodium) .Marland Kitchen... Take 1 tablet by mouth once a day  Orders: Stress Echo (Stress Echo)  Problem # 2:  COUMADIN THERAPY (ICD-V58.61) Goal INR 2-3. Followed by primary care.  Problem # 3:  DIABETES MELLITUS, CONTROLLED (ICD-250.00)  His updated medication list for this problem includes:    Metformin Hcl 500 Mg Tabs (Metformin hcl) .Marland Kitchen... Take 1 tablet by mouth once a day in the am.  Problem # 4:  MORBID OBESITY (ICD-278.01) Schedule stress echocardiogram and then begin exercise program.  Problem # 5:  SLEEP APNEA (ICD-780.57)  Problem # 6:  ESSENTIAL HYPERTENSION, BENIGN (ICD-401.1) Blood pressure controlled. Continue present medication. His updated medication list for this problem includes:    Cartia Xt 120 Mg Xr24h-cap (Diltiazem hcl coated beads) .Marland Kitchen... Take 1 tablet by mouth once a day  Patient Instructions: 1)  Your physician recommends that you return for lab work in:AT  TIME OF STRESS TEST 2)  Your physician wants you to follow-up in:6 MONTHS   You will receive a reminder letter in the mail two months in advance. If you don't receive a letter, please call our office to schedule the follow-up appointment. 3)  Your physician has requested that you have a stress echocardiogram. For further information please visit [email or web address]  Please follow instruction sheet as given.

## 2010-06-07 ENCOUNTER — Telehealth: Payer: Self-pay | Admitting: Family Medicine

## 2010-06-14 ENCOUNTER — Telehealth (INDEPENDENT_AMBULATORY_CARE_PROVIDER_SITE_OTHER): Payer: Self-pay | Admitting: *Deleted

## 2010-06-15 ENCOUNTER — Other Ambulatory Visit (INDEPENDENT_AMBULATORY_CARE_PROVIDER_SITE_OTHER): Payer: Self-pay

## 2010-06-15 ENCOUNTER — Other Ambulatory Visit: Payer: Self-pay | Admitting: Cardiology

## 2010-06-15 ENCOUNTER — Encounter: Payer: Self-pay | Admitting: Cardiology

## 2010-06-15 ENCOUNTER — Ambulatory Visit (HOSPITAL_COMMUNITY): Payer: Self-pay | Attending: Cardiology

## 2010-06-15 DIAGNOSIS — R0989 Other specified symptoms and signs involving the circulatory and respiratory systems: Secondary | ICD-10-CM

## 2010-06-15 DIAGNOSIS — I10 Essential (primary) hypertension: Secondary | ICD-10-CM | POA: Insufficient documentation

## 2010-06-15 DIAGNOSIS — E119 Type 2 diabetes mellitus without complications: Secondary | ICD-10-CM | POA: Insufficient documentation

## 2010-06-15 DIAGNOSIS — R0609 Other forms of dyspnea: Secondary | ICD-10-CM | POA: Insufficient documentation

## 2010-06-15 DIAGNOSIS — I4891 Unspecified atrial fibrillation: Secondary | ICD-10-CM

## 2010-06-15 LAB — TSH: TSH: 1.18 u[IU]/mL (ref 0.35–5.50)

## 2010-06-19 NOTE — Progress Notes (Signed)
Summary: stress echo appt  Phone Note Outgoing Call   Summary of Call: Patient is aware of stress echo appt, per Allen Kell.

## 2010-06-26 NOTE — Progress Notes (Signed)
Summary: [facility] Refill  Phone Note Refill Request Call back at Home Phone [phone] Message from:  Patient  Refills Requested: Medication #1:  WARFARIN SODIUM 5 MG TABS Take 1 tablet by mouth once a day message left for pt to return call with name of 2nd medication he needs refill on as I was unable to understand on his message.   Method Requested: Electronic Initial call taken by: Francee Piccolo CMA Duncan Dull),  June 07, 2010 4:03 PM  Follow-up for Phone Call        2nd message left of rpt to return call for 2nd med name. Refill sent for warfarin according to instructions in last ROV note.  Francee Piccolo CMA Duncan Dull)  June 08, 2010 2:40 PM   .3rd message left for pt to return call. Francee Piccolo CMA Duncan Dull)  June 14, 2010 9:32 AM   No return call.  Follow up ended. Follow-up by: Francee Piccolo CMA Duncan Dull),  June 18, 2010 11:45 AM    Prescriptions: WARFARIN SODIUM 5 MG TABS (WARFARIN SODIUM) Take 1 tablet by mouth once a day  #30 x 0   Entered by:   Francee Piccolo CMA (AAMA)   Authorized by:   Nani Gasser MD   Signed by:   Francee Piccolo CMA (AAMA) on 06/08/2010   Method used:   Electronically to        [facility]* (retail)       [address] [place], [place]  [address]       Ph: [phone]       Fax: [phone]   RxID:   [id number]

## 2010-07-03 ENCOUNTER — Other Ambulatory Visit: Payer: Self-pay | Admitting: *Deleted

## 2010-07-03 DIAGNOSIS — I10 Essential (primary) hypertension: Secondary | ICD-10-CM

## 2010-07-03 MED ORDER — DILTIAZEM HCL ER COATED BEADS 120 MG PO CP24: 120.0000 mg | ORAL_CAPSULE | Freq: Every day | ORAL | Status: DC

## 2010-07-12 ENCOUNTER — Other Ambulatory Visit: Payer: Self-pay | Admitting: Family Medicine

## 2010-07-19 ENCOUNTER — Telehealth: Payer: Self-pay | Admitting: Family Medicine

## 2010-07-19 NOTE — Telephone Encounter (Signed)
Patient called and left a message on the triage line stating that he needs a Rx. refill and wants a phone call back at [phone]. Thanks, Michaelle Copas

## 2010-07-19 NOTE — Telephone Encounter (Signed)
Pt needs to call his pharmacy. Also he hasn't been in for a coumadin check in a really long time. Is he doing this elsewhere or is he off of coumain.

## 2010-07-20 NOTE — Telephone Encounter (Signed)
Pt can send a pharm request for his vicodin to verify his dose, quantity, etc.

## 2010-07-20 NOTE — Telephone Encounter (Signed)
Pt has not had coumadin checked any where in a while and also wanted pain med refille. Pt states he is going to see pain management in 1 week and needed to be bridged until he goes to see them

## 2010-07-24 ENCOUNTER — Other Ambulatory Visit: Payer: Self-pay | Admitting: Family Medicine

## 2010-07-26 ENCOUNTER — Other Ambulatory Visit: Payer: Self-pay | Admitting: Family Medicine

## 2010-08-01 ENCOUNTER — Ambulatory Visit: Payer: Self-pay | Admitting: Family Medicine

## 2010-08-01 ENCOUNTER — Encounter: Payer: Self-pay | Admitting: Family Medicine

## 2010-08-01 ENCOUNTER — Ambulatory Visit (INDEPENDENT_AMBULATORY_CARE_PROVIDER_SITE_OTHER): Payer: Self-pay | Admitting: Family Medicine

## 2010-08-01 DIAGNOSIS — E119 Type 2 diabetes mellitus without complications: Secondary | ICD-10-CM

## 2010-08-01 DIAGNOSIS — M549 Dorsalgia, unspecified: Secondary | ICD-10-CM

## 2010-08-01 DIAGNOSIS — I4891 Unspecified atrial fibrillation: Secondary | ICD-10-CM

## 2010-08-01 LAB — POCT GLYCOSYLATED HEMOGLOBIN (HGB A1C): Hemoglobin A1C: 7.1

## 2010-08-01 LAB — POCT INR: INR: 1.2

## 2010-08-01 MED ORDER — WARFARIN SODIUM 1 MG PO TABS: 1.0000 mg | ORAL_TABLET | ORAL | Status: DC

## 2010-08-01 MED ORDER — WARFARIN SODIUM 5 MG PO TABS: 5.0000 mg | ORAL_TABLET | Freq: Every day | ORAL | Status: DC

## 2010-08-01 MED ORDER — METFORMIN HCL 500 MG PO TABS: 500.0000 mg | ORAL_TABLET | Freq: Two times a day (BID) | ORAL | Status: DC

## 2010-08-01 MED ORDER — HYDROCODONE-ACETAMINOPHEN 10-325 MG PO TABS: 1.0000 | ORAL_TABLET | Freq: Three times a day (TID) | ORAL | Status: AC | PRN

## 2010-08-01 NOTE — Progress Notes (Signed)
  Subjective:    Patient ID: Rodney Bright, male    DOB: 10/21/1960, 50 y.o.   MRN: [id number]  HPI Says can't work without his vicodin. Has been going to [facility] for his meds, but they want him to see pain management. Says he can't work through an 8 hour shift without it. Right now he can't afford to go to pain management because he is without insurance.   He is also here to get his coumadin checked.  He hasn't had an INR since Feb and never followed up. Then ran out of coumadin. He does feel he has been going in and out of a-fib.   He has also had cold symptoms for approximately 3 days. It had nasal congestion, runny nose and slight cough. He's also had some maxillary sinus pressure and headache. No fever. Some mild postnasal drip. He's also felt fatigued.  Review of Systems     Objective:   Physical Exam  Constitutional: He is oriented to person, place, and time. He appears well-developed and well-nourished.  HENT:  Head: Normocephalic and atraumatic.  Right Ear: External ear normal.  Left Ear: External ear normal.  Nose: Nose normal.  Mouth/Throat: Oropharynx is clear and moist.  Eyes: Conjunctivae are normal. Pupils are equal, round, and reactive to light.  Neck: Neck supple. No thyromegaly present.  Cardiovascular: Normal rate, regular rhythm and normal heart sounds.   Pulmonary/Chest: Effort normal and breath sounds normal.  Lymphadenopathy:    He has no cervical adenopathy.  Neurological: He is alert and oriented to person, place, and time.          Assessment & Plan:  URI-this is likely viral. If he is not significantly better in one week and to call the office and we can consider treatment with an antibiotic for sinusitis.

## 2010-08-01 NOTE — Assessment & Plan Note (Signed)
His diabetes is fairly well controlled. We still need to get his A1c under 7. He's not taking his metformin once a day and asked him to try to increase this to twice a day. If he has any hypoglycemic episode he can let me know.

## 2010-08-01 NOTE — Assessment & Plan Note (Signed)
Chronic back pain. Since he is out of insurance right now I will prescribe his hydrocodone 10/325 mg 3 tabs daily, 90 tabs per month. I let him know that if he is happy with his current regimen and I will fill this for him. I will likely put him on a pain contract at the next visit. If he feels that this is not adequate for his pain changes in any way and he will need to be referred for chronic pain management to a pain specialist. He understands this and is agreement with this arrangement.

## 2010-08-01 NOTE — Assessment & Plan Note (Signed)
Please see the anticoagulants for the change in his Coumadin today. He was subtherapeutic his dose was adjusted. I explained the importance of coming in getting this checked. He cannot just disappear for 2 months and quit taking his Coumadin. He is to follow up in one week for a repeat INR.

## 2010-08-08 ENCOUNTER — Telehealth: Payer: Self-pay | Admitting: Family Medicine

## 2010-08-08 NOTE — Telephone Encounter (Signed)
Call pt: Due for repeat Chest CT to f/u enlarged mediastinal LN seen earlier this year. Lt me know if OK to schedule. I don't think he has insurance right now.

## 2010-08-08 NOTE — Telephone Encounter (Signed)
Left message on pt.'s vm.

## 2010-08-13 ENCOUNTER — Telehealth: Payer: Self-pay | Admitting: Family Medicine

## 2010-08-13 NOTE — Telephone Encounter (Signed)
Pt wants to know if we have rec a letter from Dr. Heron Nay regarding taking him off Coumadin because Dx kidney stone and pt is to undergo  Surgery on 08-31-10.  Placed stint currentlyby emerg  til then.  Please advise. Plan:  Routed to Dr. Marlyne Beards, LPN [facility] Dimes

## 2010-08-13 NOTE — Telephone Encounter (Signed)
No i haven't received anything. Can you call his office and seee if they can fax over the notes. Thank you.

## 2010-08-13 NOTE — Telephone Encounter (Signed)
Pt notified and LMOM that we have not received a dictated office note from his urologist. Plan:  Instructed pt to call the urologist and have them fax the dictated note to our office.  Left our fax number also. Jarvis Newcomer, LPN Domingo Dimes

## 2010-08-14 ENCOUNTER — Ambulatory Visit (INDEPENDENT_AMBULATORY_CARE_PROVIDER_SITE_OTHER): Payer: Self-pay | Admitting: Family Medicine

## 2010-08-14 DIAGNOSIS — I2699 Other pulmonary embolism without acute cor pulmonale: Secondary | ICD-10-CM

## 2010-08-14 DIAGNOSIS — I4891 Unspecified atrial fibrillation: Secondary | ICD-10-CM

## 2010-08-14 LAB — POCT INR: INR: 1.1

## 2010-08-14 NOTE — Progress Notes (Signed)
  Subjective:    Patient ID: Rodney Bright, male    DOB: 1960-10-28, 50 y.o.   MRN: [id number]  HPI He is off coumadin per Urology as he is scheduled for a procedure for his kidneys. He will need to restart this the day after his procedure.    Review of Systems     Objective:   Physical Exam        Assessment & Plan:

## 2010-08-21 NOTE — Consult Note (Signed)
REFERRING PHYSICIAN:  Nani Gasser, MD.   HISTORY OF PRESENT ILLNESS:  A 50 year old male who is involved in  workers's comp injury July 17, 2007.  He states that he has not worked  since that time.  He has seen Dr. Nelda Severe who performed spine  surgery who evaluated him, sent him to Physical Therapy.  He was  diagnosed with spine arthritis and sent for physical therapy.  He was  discharged by Dr. Alveda Reasons after reaching a maximal medical improvement.  The patient thinks that his partial permanent disability rating is 10-  12% range.  He does have an attorney to represent him.   His average pain is in the 7-8/10 range.  He has pain in his low back,  some in his lateral thighs and lateral knee area as well as the neck  area.  He describes the pain is constant, sharp, burning, stabbing,  tingling, aching interfering with activity at 7/10 level.  Sleep is  fair.  Pain is worse with walking, bending, sitting, standing, and  improves with rest, pacing, activities, and medications.  Relief from  meds is good.  He walks without assistance.   PAST MEDICAL HISTORY:  Significant for sleep apnea.  He has morbid  obesity.  He has depression, anxiety, and has been on Lexapro for that.   His functional status is independent.  He needs some assist with certain  household duties.  He can walk 10-15 minutes at a time, climb steps and  drive.   He indicates that he does do some swimming in terms activity.   Alcohol use is 7-10 shots per day.  He states he uses this for pain  control.  He has also gotten prescriptions from [facility]  for oxycodone and hydrocodone.  He denies any illegal drug use.  No  DUIs.   PHYSICAL EXAMINATION:  VITAL SIGNS:  His blood pressure is 138/89, pulse  82,  and weight 400 pounds.  GENERAL:  Morbidly obese male in no acute distress.  EXTREMITIES:  Without edema.  He has 5/5 strength bilateral upper and  lower extremity.  He has good range of motion  upper and lower  extremities.  NEUROLOGIC:  Orientation x3.  Affect is alert and irritable.  Gait is  wide-based, but this due to body habitus.  His coordination is normal in  the upper and lower extremities.  Deep tendon reflexes are normal.  Sensation is normal.  NECK:  Range of motion is normal.  BACK:  Lumbar spine range of motion is about 50% forward flexion and  extension.  At least the flexion is inhibited by body habitus with  abdominal panniculus.   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  I discussed multimodal  treatment approach including further evaluation using medial branch  blocks as well as physical therapy lumbar stabilization program as well  as some non-narcotic medications including anti-inflammatories.   He states that he is not interested in this.  He wants some pain  medicine, preferably oxycodone and states that he can buy on the streets  if he had to.  He has contacts from being in [employer] business.  His alcohol use is excessive and would also interact with narcotic  analgesics.   He will follow up on an as-needed basis.  The patient did not want his  urine drug screen sent off, since he was not planning on following up.  Erick Colace, M.D.  Electronically Signed     AEK/MedQ  D:06/17/2008 11:13:45  T:06/17/2008 23:30:59  Job #:  [id number]   cc:   Nelda Severe, MD  Fax: [phone]   Nani Gasser, M.D.  [phone]  [place], [place] [address]

## 2010-08-21 NOTE — Assessment & Plan Note (Signed)
[facility]                            CARDIOLOGY OFFICE NOTE   NAME:Hibberd, TEJ MURDAUGH                     MRN:          [id number]  DATE:12/02/2007                            DOB:          1961-02-04    Mr. Rodney Bright is a 50 year old gentleman who presents for evaluation of  dyspnea.  The patient has no prior cardiac history.  He does have a long  history of obesity.  He underwent gastric bypass in 2005.  At that time,  he was 600 pounds by his report.  He now is in the 400 range.  He does  note some dyspnea on exertion, but there is no orthopnea, PND, or  increased pedal edema.  He has not had chest pain, palpitations or  syncope.  Because of his dyspnea, we are asked to further evaluate.  Note, he is also contemplating starting an exercise program.  Because of  the above, we were asked to further evaluate.   HIS MEDICATIONS AT PRESENT INCLUDE:  1. Lexapro 100 mg p.o. daily.  2. Ambien at bedtime.  3. Vitamin D.  4. Multivitamin.  5. Iron.  6. Calcium CR 12.5 mg p.o. daily.  He takes Vicodin as needed for arthritis and back pain.   He has no known drug allergies.   SOCIAL HISTORY:  He does not smoke or consume alcohol at present.  Note,  he does have a history of substance abuse as a young person, but had  none since age 63.   FAMILY HISTORY:  Negative for coronary artery disease.  He does state  that his father had an aortic aneurysms.   PAST MEDICAL HISTORY:  There is no diabetes mellitus or hyperlipidemia.  He has had recent elevated blood pressure readings, but has not been  diagnosed with hypertension.  He does have sleep apnea.  He had previous  gastric bypass surgery as described in the HPI.  He has also had  nephrolithiasis.  He has had prior cholecystectomy and tonsillectomy.  He also recently injured his back and is having back pain.  He also has  had surgery on his hand from falling at work.   REVIEW OF SYSTEMS:  He denies any  headaches, fevers, or chills.  There  is no productive cough or hemoptysis.  There is no dysphagia,  odynophagia, melena, or hematochezia.  There is no dysuria or hematuria.  No rashes or seizure activity.  There is orthopnea, PND, or pedal edema.  Remaining systems are negative.   PHYSICAL EXAMINATION:  VITAL SIGNS:  Today, shows a blood pressure of  128/89.  His pulse is 92.  He weighs 400 pounds.  GENERAL:  He is well-developed and morbidly obese.  He is no acute  distress at present.  SKIN:  Warm and dry.  He does not appear to be depressed.  There is no  peripheral clubbing.  BACK:  Normal.  HEENT:  Normal eyelids.  NECK:  Supple with normal upstroke bilaterally, no bruits noted.  There  is no jugular distention and I cannot appreciate thyromegaly.  CHEST:  Clear to auscultation.  There is no expansion.  CARDIOVASCULAR:  Regular rhythm.  Normal S1 and S2.  There are no  murmurs, rubs or gallops noted.  ABDOMEN:  Difficult due to his obesity.  He has had prior gastric bypass  surgery.  I cannot appreciate hepatosplenomegaly or masses, although  again they examined with difficult.  It is not tender.  He does have  positive bowel sounds.  His femoral pulses are difficult to palpate due  to his obesity.  EXTREMITIES:  Show varicosities and trace edema.  I cannot palpate  cords.  He has 2+ posterior tibial pulses bilaterally.  NEUROLOGIC:  Grossly intact.  His electrocardiogram shows a sinus rhythm  with left axis deviation.  There is poor R-wave progression, but there  are no significant ST changes noted.   DIAGNOSES:  1. Dyspnea - This is most likely secondary to his obesity and history      of lupus obstructive sleep apnea.  However, he does have risk      factors and is contemplating beginning exercise program.  We will      therefore schedule a stress echocardiogram prior to him initiating      his program and to evaluate his dyspnea, both his LV function to      exclude  ischemia.  If it is normal, we will not pursue further      cardiac evaluation.  2. Obesity - He will begin an exercise and diet program.  We discussed      the importance of weight loss.\  3. Elevated blood pressure - His diastolic blood pressure is mildly      elevated.  I have asked him to track this and if it runs greater      than 85, then we can consider initiating medication in the future.  4. Anemia - Management per Dr. Linford Arnold.  5. Risk factor modification - He does not smoke.  Notes, his      cholesterol was checked recently and his total was 147 with an HDL      of 41 and an LDL of 77.  6. History of nephrolithiasis.   We will see him back in 1 year or sooner, if necessary.     Madolyn Frieze Jens Som, MD, [facility]  Electronically Signed    BSC/MedQ  DD: 12/02/2007  DT: 12/03/2007  Job #: [id number]   cc:   Nani Gasser, M.D.

## 2010-08-24 NOTE — Discharge Summary (Signed)
[place]  Patient:    Rodney Bright, Rodney Bright Visit Number: [id number] MRN: [id number]          Service Type: SUR Location: 1W 0158 01 Attending Physician:  Londell Moh Dictated by:   Jamison Neighbor, M.D. Admit Date:  07/17/2001 Discharge Date: 07/27/2001                             Discharge Summary  DISCHARGE DIAGNOSES: 1. Ureteral calculus. 2. Hydronephrosis. 3. Pneumonia. 4. Respiratory failure. 5. Urinary tract infection. 6. Morbid obesity. 7. Diabetes mellitus. 8. Chronic obstructive pulmonary disease.  PRINCIPAL PROCEDURE:  Cystoscopy, retrograde urethrogram and ureteral catheterization performed on July 22, 1000.  HISTORY OF PRESENT ILLNESS:  This 50 year old male was initially seen in the [facility].  He was found to have a stone in the left kidney causing hydronephrosis. The patient was transferred to [facility] with plan to undergo definitive therapy including laser lithotripsy, which is not available at the [facility].  When the patient was weighed on the floor, it was clear that he weighed over 500 pounds and would not be able to undergo surgery at [facility] because there is not bed that will accept the patient with a weight of that size.  For that reason, the patient was transferred back to [facility] in order to undergo insertion of the double J stent.  PAST MEDICAL HISTORY:  This is remarkable for morbid obesity, scheduled to have bypass performed at [facility] in June.  He is known to have diabetes mellitus and sleep apnea.  He uses CPAP.  MEDICATIONS:  Avandia, glipizide, glucophage, Zoloft and Xanax.  SOCIAL HISTORY:  The patient does not use tobacco or alcohol.  In his own words, he is remarkably healthy for a man who weighs over 500 pounds.  REVIEW OF SYSTEMS:  This is positive for joint pain and sleep  apnea.  HOSPITAL COURSE:  The patient had low grade temperature and had decreasing saturations.  He also had a productive cough and there was concern that he might have a PE.  There was consideration given to starting heparin but chest x-ray showed an infiltrate thought to be most consistent with pneumonia.  The patient was too large to undergo imaging studies for pulmonary embolus.  The patient was started on Ventolin nebulizers and antibiotics and did have a decrease in his temperature as well as improved saturations.  The patient was monitored in the intensive care unit at [facility] because of his lung issues. The patient was transferred from [facility] to [facility] on July 20, 2001, and then was take to the operating room on July 22, 2001, for placement of the stent and a 6 French x 28 cm double J catheter was passed.  The KUB showed that the stent was in good position.  A KUB was needed because fluoroscopy was difficult. The patients creatinine had slight rise so his gentamicin was held.  The patient was maintained on broad spectrum antibiotics. Pulmonary consultation was obtained for help with the patients respiratory failure.  The patient was treated for pneumonia and by July 27, 2001, was felt to be stable. The patients pain was better.  He was sent home on Augmentin, Tylox, Pyridium Plus and Ambien.  The patient will need to undergo definitive surgery for  the stone.  The patient will need to lose some weight if he wants to undergo laser lithotripsy.  Alternatively, the patient could undergo surgery after his bypass.  One other option that was discussed with the patient was the possibility of having the stone removed at the time of the surgery by a urologist in the [place], area.  The patient will give some consideration to this and decide what he would like to do. Dictated by:   Jamison Neighbor, M.D. Attending  Physician:  Londell Moh DD:  08/21/01 TD:  08/24/01 Job: [id number]

## 2010-08-24 NOTE — Op Note (Signed)
[place]  Patient:    Rodney Bright, Rodney Bright Visit Number: [id number] MRN: [id number]          Service Type: MED Location: MICU 2104 01 Attending Physician:  Londell Moh Dictated by:   Jamison Neighbor, M.D. Proc. Date: 07/22/01 Admit Date:  07/20/2001                             Operative Report  PREOPERATIVE DIAGNOSIS:  Left ureteral calculus.  POSTOPERATIVE DIAGNOSIS:  Left ureteral calculus.  OPERATION PERFORMED:  Cystoscopy, left retrograde and left double-J catheter insertion.  SURGEON:  Jamison Neighbor, M.D.  ANESTHESIA:  General.  COMPLICATIONS:  None.  DRAINS:  6 French by 28 cm double-J catheter.  INDICATIONS FOR PROCEDURE:  The patient is a 50 year old male who first presented to the [place] with pain on the left hand side and what was felt to be a probable kidney stone.  The patient has morbid obesity with a weight of over 500 pounds and it was impossible to perform CT scan.  An ultrasound did show left hydronephrosis and what was thought to be a stone in the ureter.  Because of the large size of the stone and the fact that the laser equipment for in situ laser lithotripsy was only available at [facility], the patient was transferred to [facility] for definitive care and was scheduled for surgery on July 18, 2001.  Unfortunately, the patients weight was over 500 pounds even though he had stated that his actual weight was between 490 and 500 pounds and there was no table on the [employer] capable of taking a patient of that weight.  The patient had additional imaging study including an IVP, which showed what appeared to be a 2 cm stone at about L3; however, he became acutely short of breath and had to be transferred to the intensive care unit.  In the intensive care unit he was given antibiotic therapy and nebulizers and began to improve.  He was transferred to  [facility] with the plan being to place a stent temporarily until the patients overall medical condition has improved. He is scheduled for gastric bypass and it is felt that if his weight will decrease that the stone can be taken care of at a later date.  The patient is aware of the risks and benefits of this procedure.  He does know that if his weight does come down and his overall medical condition stabilizes, he can undergo in situ laser lithotripsy to try to clear the stone.  We will make a decision as to whether to do that before or after his proposed gastric bypass which is scheduled for some time in June.  The patient understands the risks and benefits of the procedure and gave full and informed consent.  DESCRIPTION OF PROCEDURE:  After successful induction of general anesthesia, the patient was placed in dorsal lithotomy position and prepped with Betadine and draped in the usual sterile fashion.  It should be noted that it was difficult to intubate.  In addition, tapes had to be used to pull the abdominal pannus off the top of the penis in order to allow exposure of the genitalia.  The cystoscope was inserted.  The urethra was visualized in its entirety and was found to be normal beyond the veru montanum.  The prostate was not particularly  enlarged.  The bladder was carefully inspected.  Imaging the bladder through the cystoscope was somewhat difficult due to the patients large mass.  However, there was no obvious stone seen with either 12 degree or 70 degree lens.  Both ureteral orifices were unremarkable in their appearance. The right ureteral orifice was cannulated.  A retrograde study was performed. Imaging was difficult because of the patients large size but the C-arm was pushed onto the top of the patients abdomen which was then compressed underneath the C0-arm and a limited image of the kidney could be seen. Hydronephrosis was noted but the stone was not  well visualized.  A guide wire was passed up to the kidney.  When the ureteral catheter was passed to the kidney, a hydronephrotic drip was obtained.  The guide wire was then reinserted and a double-J catheter was passed over the kidney and up to the pelvis.  It did appear to coil.  A 6 French x 28 cm device was utilized due to the patients large size.  The bladder was drained.  The patient tolerated the procedure well.  Because of anesthesia concerns as well as his pre-existing pulmonary condition, he will go to the intensive care unit where a critical care management and pulmonary care consultation will be obtained. Dictated by:   Jamison Neighbor, M.D. Attending Physician:  Londell Moh DD:  07/22/01 TD:  07/22/01 Job: [id number]

## 2010-08-24 NOTE — H&P (Signed)
[facility]  Patient:    Rodney Bright, Rodney Bright Visit Number: [id number] MRN: [id number]          Service Type: EMS Location: [place] Attending Physician:  Devoria Albe Dictated by:   Jamison Neighbor, M.D. Admit Date:  07/17/2001                           History and Physical  ADMISSION DIAGNOSES: 1. Left renal calculus with hydronephrosis. 2. Morbid obesity. 3. Sleep apnea. 4. Diabetes.  HISTORY OF PRESENT ILLNESS:  This 50 year old male presented to the [place] Emergency Room on July 17, 2001, with pain in the left flank.  Because of his large size, a CT scan could not be performed, but an ultrasound showed what appeared to be hydronephrosis secondary to probable left ureteral calculus.  The patient was transferred to [facility] by Barron Alvine, M.D., because it was thought that ureteroscopy and in situ laser lithotripsy might be required and that equipment is not available at [facility].  The patient was transferred and admitted.  He was started on IV pain medicine with a morphine pump, as well as Toradol.  Arrangements were made for him to undergo cystoscopy, stent placement, and ureteroscopy on July 18, 2001. Unfortunately, the patients official weight turned out to be 513 pounds and the bed limit on the table in the operating room is 500 pounds.  Discussion ensued between me and Caleb Popp, the R.N. in charge of the operating room, and it became clear that we could not bring in a larger bed in time for this patient to be operated upon.  The only bed in the [facility] system that would be available to him is at [facility], which could take a patient up to 650 pounds.  Unfortunately, that cannot be moved to the [facility] campus and at the [facility] campus the holmium laser is not available.  I had a discussion with the  patient this morning concerning the possibility of transferring him to [facility] and possibly moving the laser if necessary with placement of the stent and ureteroscopy as indicated. The patient indicates this morning that he actually has very little pain and would prefer to see if the stone has passed and hopefully allow the stone to pass on its own.  Obviously this would be ideal in this patient.  He does understand that if his weight can come down under 500 pounds that we could perform this here at [facility], which would be ideal.  He does note that in the past whenever he stops eating he loses weight very rapidly and indeed needs to drop his weight down to approximately 450 pounds by June because he is scheduled to undergo gastric bypass in [place], [place], and his doctor is unwilling to operate unless he drops his weight to 450 pounds.  PAST MEDICAL HISTORY:  Remarkable for diabetes.  He has only recently developed this procedure.  He has been on medication for less than two years.  MEDICATIONS:  He is currently on Avandia 8 mg daily, glipizide 5 mg b.i.d., and Glucophage 500 mg two tablets b.i.d.  He is also taking Vioxx for some problems with pain in the legs and Zoloft 100 mg daily.  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  He has had no other surgery until his proposed gastric bypass for June of this year.  REVIEW OF SYSTEMS:  Aside from the diabetes and sleep apnea, as well as some joint pain due to his weight, he is in surprising good health for a man of his size.  SOCIAL HISTORY:  Unremarkable.  The patient does not use tobacco or alcohol. He is married and works regularly.  REVIEW OF SYSTEMS:  The patient has developed some problems with diminished bladder outflow and he feels that he may have some degree of prostate problems.  In the past, he has had one episode of prostatitis, but this sounds to be  more consistent with symptoms BPH and bladder outlet obstruction.  He also notes that his wife has had a difficult time achieving a pregnancy and they are interested in fertility work.  PHYSICAL EXAMINATION:  He is a well-developed, well-nourished, but very overweight male with a formal weight that came in at 513 pounds here at [facility].  When he was weighed at [facility], it was thought to be 496 pounds.  HEENT:  Normocephalic and atraumatic.  The patient does wear corrective lenses.  As noted above, he does have to use a CPAP machine for sleep apnea.  NECK:  Supple.  No adenopathy or thyromegaly.  LUNGS:  Clear.  HEART:  Regular rate and rhythm.  No murmurs, thrills, gallops, rubs, or heaves.  ABDOMEN:  Soft.  He does have some pain on the left-hand side, although that has gotten much better since starting on the PCA pump.  He is now requiring very little pain medication.  EXTREMITIES:  No clubbing, cyanosis, or edema.  RECTAL:  Examination was not performed.  I will check an IVP today to determine if the patient is still obstructed.  If so, he will need to be transferred to [facility] for stent placement unless his weight drops down to under 500 cc, in which case it can be done here.  The patient will be started on Flomax today because of his bladder outlet symptoms that may require further evaluation.  He will require infertility evaluation on an outpatient basis. Dictated by:   Jamison Neighbor, M.D. Attending Physician:  Devoria Albe DD:  07/18/01 TD:  07/18/01 Job: [id number]

## 2010-08-24 NOTE — Op Note (Signed)
NAME:  Rodney Bright, PLESSINGER NO.:  [id number]   MEDICAL RECORD NO.:  [id number]                   PATIENT TYPE:  AMB   LOCATION:  DSC                                  FACILITY:  [facility]   PHYSICIAN:  Lowell Bouton, M.D.      DATE OF BIRTH:  January 17, 1961   DATE OF PROCEDURE:  DATE OF DISCHARGE:                                 OPERATIVE REPORT   PREOPERATIVE DIAGNOSIS:  Lacerated ulnar digital nerve, right index finger.   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   PROCEDURE:  Repair  of ulnar digital nerve and artery right index finger.   SURGEON:  Lowell Bouton, M.D.   ANESTHESIA:  General.   FINDINGS:  The patient had a transverse laceration over the proximal digital  crease of the right index finger. The ulnar digital nerve and artery were  completely transected.   DESCRIPTION OF PROCEDURE:  Under general anesthesia with the tourniquet on  the right arm, the right arm was prepped and draped in the usual sterile  fashion. After exsanguinating the limb, the tourniquet was inflated to 250  mmHg.   The previous laceration was spread open and extended in a zig-zag fashion  proximally and distally. Blunt dissection was carried down through the  subcutaneous tissues and bleeding points were coagulated. Blunt dissection  was carried down to the ulnar neurovascular bundle and the ends of the nerve  and artery were dissected free.   The microscope was brought in and the artery was trimmed back with a  straight scissors showing a good lumen on both sides. Dora Sims solution was used  to dilate the ends of the vessel. A dilator forceps was also used.   The artery was then repaired using a 9-0 nylon interrupted suture  circumferentially. The digital nerve was then identified and was trimmed  back to good fascicles. The nerve repair was then performed  using epineural  9-0 nylon suture. The wound was then copiously irrigated.   The tourniquet was released with good flow through the vessel. One small  leaking area was repaired with a 9-0 nylon suture. This allowed good control  of the bleeding. The wound was irrigated copiously with saline.   The microscope was taken off the field. The wound was closed with 4-0 nylon  sutures. Sterile dressings were applied followed by an Alumafoam splint to  keep the patient from extending his finger fully.   He tolerated the procedure well. He went to the recovery room awake, stable  and in good condition.                                               Lowell Bouton, M.D.    EMM/MEDQ  D:  06/14/2002  T:  06/14/2002  Job:  [id number]   cc:   Jonny Ruiz L. Rendall III, M.D.  [address] [place]  [place] [address]  Fax: [phone]

## 2010-08-24 NOTE — Op Note (Signed)
[place]  Patient:    Rodney Bright, Rodney Bright Visit Number: [id number] MRN: [id number]          Service Type: SUR Location: 1W 0158 01 Attending Physician:  Londell Moh Dictated by:   Jamison Neighbor, M.D. Proc. Date: 08/21/01 Admit Date:  07/17/2001 Discharge Date: 07/27/2001                             Operative Report  PREOPERATIVE DIAGNOSIS:  Hydronephrosis secondary to left ureteral calculous.  POSTOPERATIVE DIAGNOSIS:  Hydronephrosis secondary to left ureteral calculous.  PROCEDURE:  Cystoscopy, retrograde and double J catheter insertion.  SURGEON:  Jamison Neighbor, M.D.  ANESTHESIA:  General.  COMPLICATIONS:  None.  DRAINS:  6 French x 28 cm catheter.  INDICATIONS:  This 50 year old male had to be transferred from [place] to [facility] for insertion of a stent because his weight is over 500 pounds and no table for a patient that size is available at [employer].  The patient is to now undergo stent placement.  Definitive therapy for the stent will not be performed due to the patients overall health and due to the fact that the laser is not available at [facility].  Informed consent was obtained.  DESCRIPTION OF PROCEDURE:  After the successful induction of general anesthesia, the patient was placed in the dorsal lithotomy position and prepped with Betadine and draped in the usual sterile fashion.  Tape and straps were needed to pull the patients abdominal pannus off the top of his genitalia in order to prep that area and expose it for the cystoscopy. Cystoscopy was performed and the meatus was visualized and found to be normal beyond the verumontanum.  The patients prostate was not enlarged.  The bladder was carefully inspected.  It was free of any tumor or stones.  Both ureteral orifices were normal to configuration and location.  Retrograde performed on the left hand side showing a stone in the ureter.  This was  far too large to be extracted in any way.  A stent was passed and allowed to coil normally in the pelvis and in the bladder.  The patients bladder was drained. He tolerated the procedure and was taken to the recovery room in good condition. Dictated by:   Jamison Neighbor, M.D. Attending Physician:  Londell Moh DD:  08/21/01 TD:  08/24/01 Job: [id number]

## 2010-09-06 ENCOUNTER — Encounter: Payer: Self-pay | Admitting: Family Medicine

## 2010-09-07 ENCOUNTER — Ambulatory Visit: Payer: Self-pay | Admitting: Family Medicine

## 2010-09-07 ENCOUNTER — Ambulatory Visit (INDEPENDENT_AMBULATORY_CARE_PROVIDER_SITE_OTHER): Payer: Self-pay | Admitting: Family Medicine

## 2010-09-07 ENCOUNTER — Telehealth: Payer: Self-pay | Admitting: *Deleted

## 2010-09-07 ENCOUNTER — Encounter: Payer: Self-pay | Admitting: Family Medicine

## 2010-09-07 DIAGNOSIS — N200 Calculus of kidney: Secondary | ICD-10-CM

## 2010-09-07 DIAGNOSIS — I4891 Unspecified atrial fibrillation: Secondary | ICD-10-CM

## 2010-09-07 DIAGNOSIS — I10 Essential (primary) hypertension: Secondary | ICD-10-CM

## 2010-09-07 LAB — PROTIME-INR
INR: 1.16 (ref <1.50)
Prothrombin Time: 14.7 seconds (ref 11.6–15.2)

## 2010-09-07 MED ORDER — LISINOPRIL 10 MG PO TABS: 10.0000 mg | ORAL_TABLET | Freq: Every day | ORAL | Status: DC

## 2010-09-07 NOTE — Assessment & Plan Note (Addendum)
At this point I really think this may be triggered by his significant caffeine intake. We discussed the importance of cutting out his caffeine and if still having episdoes of going in and out of afib then really needs to see cardiology back.  He has restarted his coumadin about 10 days ago post ureteral stent. We attempted POC inr but kept getting an error message so sent him to the lab for a blood draw.

## 2010-09-07 NOTE — Telephone Encounter (Signed)
Called pt and notified him about his coumadin instructions

## 2010-09-07 NOTE — Assessment & Plan Note (Signed)
Not well controlled today. Will start an ACEi and follow up in one month for BP.

## 2010-09-07 NOTE — Progress Notes (Signed)
  Subjective:    Patient ID: Rodney Bright, male    DOB: 1960/09/09, 50 y.o.   MRN: [id number]  HPI Having episodes of afib at work. Says if he sits down and rests it will get better. He will get lightheaded and fatigued with it. Once it stops he feel normal.  He drinks large amounts of caffeine on a daily basis. He says he is taking his meds regularly. Just restarted coumadin 10 days ago after have ureteral stent place on the left. He had been off for several weeks after multiple urological procedures.  He also needs INR checked today.    Review of Systems     Objective:   Physical Exam  Constitutional: He is oriented to person, place, and time. He appears well-developed and well-nourished.       Morbidly obese.   HENT:  Head: Normocephalic and atraumatic.  Cardiovascular: Normal rate, regular rhythm and normal heart sounds.        He is not in afib today.    Pulmonary/Chest: Effort normal and breath sounds normal.  Neurological: He is alert and oriented to person, place, and time.  Skin: Skin is warm and dry.  Psychiatric: He has a normal mood and affect.          Assessment & Plan:

## 2010-09-11 ENCOUNTER — Telehealth: Payer: Self-pay | Admitting: *Deleted

## 2010-09-11 NOTE — Telephone Encounter (Signed)
Pt called and wanted to know if he supposed to take lisinopril instead of cardizem. Pt is confused about what to take for his BP

## 2010-09-11 NOTE — Telephone Encounter (Signed)
He needs to take both. ?

## 2010-09-12 ENCOUNTER — Telehealth: Payer: Self-pay | Admitting: *Deleted

## 2010-09-12 DIAGNOSIS — I10 Essential (primary) hypertension: Secondary | ICD-10-CM

## 2010-09-12 MED ORDER — DILTIAZEM HCL ER COATED BEADS 120 MG PO CP24: 120.0000 mg | ORAL_CAPSULE | Freq: Every day | ORAL | Status: DC

## 2010-09-12 NOTE — Telephone Encounter (Signed)
Pt requested cardizem be sent to pharm. DONE

## 2010-09-13 NOTE — Telephone Encounter (Signed)
Pt aware of the above  

## 2010-09-27 ENCOUNTER — Telehealth: Payer: Self-pay | Admitting: Family Medicine

## 2010-09-27 NOTE — Telephone Encounter (Signed)
Pt called and wants his lab results.  Last lab draw 09-07-10 and the telephone message says pt was given his lab results.   Plan:  [facility] for pt that call returned and to call the triage nurse. Jarvis Newcomer, LPN Domingo Dimes

## 2010-10-15 ENCOUNTER — Other Ambulatory Visit: Payer: Self-pay | Admitting: Family Medicine

## 2010-10-15 MED ORDER — HYDROCODONE-ACETAMINOPHEN 5-500 MG PO TABS: 1.0000 | ORAL_TABLET | ORAL | Status: DC | PRN

## 2010-10-16 ENCOUNTER — Other Ambulatory Visit: Payer: Self-pay | Admitting: Family Medicine

## 2010-10-16 NOTE — Telephone Encounter (Signed)
RF requested for hydrocodone apap 10-325 mg.  # 30/0 refills authorized today.  Will fax to the pharm once provider signs the prescription.  Faxed 10-16-10. Jarvis Newcomer, LPN Dom[place] Dimes

## 2010-10-22 ENCOUNTER — Other Ambulatory Visit: Payer: Self-pay | Admitting: Family Medicine

## 2010-10-24 ENCOUNTER — Telehealth: Payer: Self-pay | Admitting: Family Medicine

## 2010-10-24 NOTE — Telephone Encounter (Signed)
Yes OK for the 10/325, #90 per month. He really need to come by and sign a narcotic contract and then he can pick up rx tomorrow. I enteredd rx. Just print.

## 2010-10-24 NOTE — Telephone Encounter (Signed)
Pt called and says his hydrocodone script was sent in wrong as 5/500mg  and for #30, and he has been getting 10/325 mg and more qty.  Can you please advise as I do not see this dose in the new or old system. Rodney Newcomer, LPN [place] Dimes

## 2010-10-25 ENCOUNTER — Ambulatory Visit: Payer: Self-pay | Admitting: Family Medicine

## 2010-10-25 ENCOUNTER — Telehealth: Payer: Self-pay | Admitting: Family Medicine

## 2010-10-25 MED ORDER — HYDROCODONE-ACETAMINOPHEN 10-325 MG PO TABS: 1.0000 | ORAL_TABLET | Freq: Three times a day (TID) | ORAL | Status: DC | PRN

## 2010-10-25 NOTE — Telephone Encounter (Signed)
Closed

## 2010-10-25 NOTE — Telephone Encounter (Signed)
Pt informed that he Rx for 10/325 mg for hydrocodone was approved, and #90 was given.  Pt will need to sign a narcotic contract before the med can be given to the patient.  Script printed for Dr. Cathey Endow to sign. Pending pt coming in to sign contract and get script. Plan:  [facility] for the pt with this information. Jarvis Newcomer, LPN Dom[place] Dimes

## 2010-10-29 ENCOUNTER — Other Ambulatory Visit: Payer: Self-pay | Admitting: Family Medicine

## 2010-11-23 ENCOUNTER — Other Ambulatory Visit: Payer: Self-pay | Admitting: Family Medicine

## 2010-11-28 ENCOUNTER — Other Ambulatory Visit: Payer: Self-pay | Admitting: Family Medicine

## 2010-11-28 MED ORDER — HYDROCODONE-ACETAMINOPHEN 10-325 MG PO TABS: 1.0000 | ORAL_TABLET | Freq: Three times a day (TID) | ORAL | Status: DC | PRN

## 2010-11-28 NOTE — Telephone Encounter (Signed)
Pt stopped by the office and said he was due for his norco and he was told to sign a pain contract every month when he picked up his script.  Actually researched and pt signed pain contract last month and does not have to sign every month only needs to call every 30 days to get refill of his norco medication as long as office visits up to date.  Pt informed and a new script printed out and had Dr. Linford Arnold sign. Jarvis Newcomer, LPN [place] Dimes

## 2010-12-17 ENCOUNTER — Other Ambulatory Visit: Payer: Self-pay | Admitting: Family Medicine

## 2010-12-31 ENCOUNTER — Other Ambulatory Visit: Payer: Self-pay | Admitting: Family Medicine

## 2010-12-31 MED ORDER — HYDROCODONE-ACETAMINOPHEN 10-325 MG PO TABS: 1.0000 | ORAL_TABLET | Freq: Three times a day (TID) | ORAL | Status: DC | PRN

## 2010-12-31 NOTE — Telephone Encounter (Signed)
Pt called back and spoke with [facility] front office and sched an appt for BP.  Since pt sched an appt a 30 day supply of his hydrocodone will be sent to his pharmacy. Jarvis Newcomer, LPN [facility]

## 2010-12-31 NOTE — Telephone Encounter (Signed)
Pt called for refill of his hydrocodone.  However, even though pt due to have the pain med pt was in last 09-07-2010 and was told to follow up in 1 mth for his BP since not well controlled.  Pt did not satisfy that appt. PLan:  LMOM for the pt instructing him to call and discuss appt with the triage nurse and sched then will send pain med for 30 day supply at that point. Rodney Newcomer, LPN [facility]

## 2011-01-03 ENCOUNTER — Ambulatory Visit: Payer: Self-pay | Admitting: Family Medicine

## 2011-01-03 DIAGNOSIS — Z0289 Encounter for other administrative examinations: Secondary | ICD-10-CM

## 2011-01-31 ENCOUNTER — Ambulatory Visit: Payer: Self-pay | Admitting: Family Medicine

## 2011-01-31 ENCOUNTER — Encounter: Payer: Self-pay | Admitting: Family Medicine

## 2011-01-31 ENCOUNTER — Ambulatory Visit (INDEPENDENT_AMBULATORY_CARE_PROVIDER_SITE_OTHER): Payer: Self-pay | Admitting: Family Medicine

## 2011-01-31 DIAGNOSIS — I4891 Unspecified atrial fibrillation: Secondary | ICD-10-CM

## 2011-01-31 DIAGNOSIS — E119 Type 2 diabetes mellitus without complications: Secondary | ICD-10-CM

## 2011-01-31 DIAGNOSIS — E291 Testicular hypofunction: Secondary | ICD-10-CM

## 2011-01-31 LAB — POCT GLYCOSYLATED HEMOGLOBIN (HGB A1C): Hemoglobin A1C: 7.1

## 2011-01-31 LAB — POCT UA - MICROALBUMIN: Creatinine, POC: 300 mg/dL

## 2011-01-31 LAB — POCT INR: INR: 1.1

## 2011-01-31 MED ORDER — TESTOSTERONE 10 MG/ACT (2%) TD GEL: 1.0000 mL | Freq: Every day | TRANSDERMAL | Status: DC

## 2011-01-31 MED ORDER — HYDROCODONE-ACETAMINOPHEN 10-325 MG PO TABS: 1.0000 | ORAL_TABLET | Freq: Three times a day (TID) | ORAL | Status: DC | PRN

## 2011-01-31 MED ORDER — LISINOPRIL 10 MG PO TABS: 10.0000 mg | ORAL_TABLET | Freq: Every day | ORAL | Status: DC

## 2011-01-31 NOTE — Patient Instructions (Signed)
Remember to get your eye exam.

## 2011-01-31 NOTE — Progress Notes (Addendum)
  Subjective:    Patient ID: Rodney Bright, male    DOB: Oct 13, 1960, 50 y.o.   MRN: [id number]  Diabetes He presents for his follow-up diabetic visit. He has type 2 diabetes mellitus. His disease course has been stable. There are no hypoglycemic associated symptoms. Pertinent negatives for diabetes include no chest pain, no foot paresthesias, no polydipsia, no polyphagia and no polyuria. Symptoms are stable. He is compliant with treatment all of the time (Thought only taking metformin once a day). His weight is increasing steadily. He is following a generally unhealthy diet. When asked about meal planning, he reported none. He has not had a previous visit with a dietician. He rarely participates in exercise. An ACE inhibitor/angiotensin II receptor blocker is not being taken. He does not see a podiatrist.Eye exam is not current.   Atrial fibrillation-he says he has been taking his Coumadin but has not followed up and them as 3 months for recheck. He denies any easy bruising or bleeding in the bowels, stool, or gums.  Needs refill on his hydocodone. He is due to and he is on a pain contract.  Needs refill on his testosterone.    Review of Systems  Cardiovascular: Negative for chest pain.  Genitourinary: Negative for polyuria.  Hematological: Negative for polydipsia and polyphagia.       Objective:   Physical Exam  Constitutional: He is oriented to person, place, and time. He appears well-developed and well-nourished.  HENT:  Head: Normocephalic and atraumatic.  Cardiovascular: Normal rate, regular rhythm and normal heart sounds.   Pulmonary/Chest: Effort normal and breath sounds normal.  Neurological: He is alert and oriented to person, place, and time.  Skin: Skin is warm and dry.  Psychiatric: He has a normal mood and affect. His behavior is normal.          Assessment & Plan:  DM- A1C 7.1 today. Almost at goal. Turns out he is not taking his metformin BID so I asked him to  start doing so. I know he has knee problems but I really want him to get some exercise as he is gainig weight.  F/U in 3 mo. Urine micro is negative. I did remind her to get an eye exam. It has been more than a year since his last eye checkup.  HTN- almost at goal today. I want him to start the lisinopril. Recheck BP in 3 mo.   Chronic Back Pain - Due for refill on meds. He is on a pain contract.   Atrial fibrillation-please see Coumadin flow sheet to adjust his level. He was subtherapeutic. Recheck in one week.  Hypogonadism-he is due for refill on his testosterone is also well overdue for lab work. We did check a PSA, liver function, testosterone level. If he is completely off of this medication I asked him to get back on the medication for about 2 weeks before he goes for his lab work. I will not refill it unless he goes for his labs. And it is difficult for him because he is currently without insurance.  I encouraged him to check into applying for financial assistance through [facility] health for his visits to make it easy for him to be compliant with his care.

## 2011-02-25 ENCOUNTER — Other Ambulatory Visit: Payer: Self-pay | Admitting: Family Medicine

## 2011-03-02 ENCOUNTER — Other Ambulatory Visit: Payer: Self-pay | Admitting: Family Medicine

## 2011-03-09 ENCOUNTER — Other Ambulatory Visit: Payer: Self-pay | Admitting: Family Medicine

## 2011-03-14 ENCOUNTER — Other Ambulatory Visit: Payer: Self-pay | Admitting: Family Medicine

## 2011-03-15 ENCOUNTER — Telehealth: Payer: Self-pay | Admitting: *Deleted

## 2011-03-15 NOTE — Telephone Encounter (Signed)
Pharmacist at [facility] called and states that pt is using multiple pharmacies- transferring Norco Rx's and that we filled he got Norco from Dr[facility] Corinna [facility]ra[facility] on November 20th and filled the Norco he got from you on November 21 at different pharmacy. Instructed pharmacists to not fill the rx sent today and I would notify MD

## 2011-03-15 NOTE — Telephone Encounter (Signed)
Ok, no more narcotics  Or scheduled drugs from our office Will put into chart.

## 2011-03-25 ENCOUNTER — Other Ambulatory Visit: Payer: Self-pay | Admitting: *Deleted

## 2011-04-04 ENCOUNTER — Other Ambulatory Visit: Payer: Self-pay | Admitting: *Deleted

## 2011-04-04 MED ORDER — DILTIAZEM HCL ER BEADS 120 MG PO CP24: 120.0000 mg | ORAL_CAPSULE | Freq: Every day | ORAL | Status: DC

## 2011-04-04 MED ORDER — WARFARIN SODIUM 5 MG PO TABS: 5.0000 mg | ORAL_TABLET | Freq: Every day | ORAL | Status: DC

## 2011-04-04 MED ORDER — WARFARIN SODIUM 1 MG PO TABS: 1.0000 mg | ORAL_TABLET | ORAL | Status: DC

## 2011-04-04 NOTE — Telephone Encounter (Signed)
Pt called and states his diltaziem and warfarin should be sent [facility] [place].rx called in

## 2011-04-08 ENCOUNTER — Ambulatory Visit: Payer: Self-pay | Admitting: Family Medicine

## 2011-04-16 ENCOUNTER — Ambulatory Visit: Payer: Self-pay | Admitting: Family Medicine

## 2011-06-11 ENCOUNTER — Ambulatory Visit: Payer: Self-pay | Admitting: Family Medicine

## 2011-06-11 ENCOUNTER — Ambulatory Visit (INDEPENDENT_AMBULATORY_CARE_PROVIDER_SITE_OTHER): Payer: Self-pay | Admitting: Family Medicine

## 2011-06-11 ENCOUNTER — Encounter: Payer: Self-pay | Admitting: Family Medicine

## 2011-06-11 VITALS — BP 145/89 | HR 90 | Temp 98.1°F | Ht 69.0 in | Wt >= 6400 oz

## 2011-06-11 DIAGNOSIS — I4891 Unspecified atrial fibrillation: Secondary | ICD-10-CM

## 2011-06-11 DIAGNOSIS — M549 Dorsalgia, unspecified: Secondary | ICD-10-CM

## 2011-06-11 DIAGNOSIS — J329 Chronic sinusitis, unspecified: Secondary | ICD-10-CM

## 2011-06-11 DIAGNOSIS — G8929 Other chronic pain: Secondary | ICD-10-CM

## 2011-06-11 LAB — POCT INR: INR: 1.1

## 2011-06-11 NOTE — Progress Notes (Signed)
  Subjective:    Patient ID: Rodney Bright, male    DOB: [birth year], 51 y.o.   MRN: [id number]  HPI Sinus congestion and felt bad starting 7 days ago. Says had fever and chills for 48 hours. Says nose has been bloody.  Having a lot of nose bleeds.  Last coumadin check was along time ago.  Missed 3 days of coumadin and restarted 2 days ago.  No cough med. Mils ST initially but now better Says does feel better today. No GI sxs  Wants to discuss his narcotic pain meds  A-fib- Coumdin check.   Review of Systems     Objective:   Physical Exam  Constitutional: He is oriented to person, place, and time. He appears well-developed and well-nourished.       He is morbidly obese.  HENT:  Head: Normocephalic and atraumatic.  Right Ear: External ear normal.  Left Ear: External ear normal.  Nose: Nose normal.  Mouth/Throat: Oropharynx is clear and moist.       TMs and canals are clear.   Eyes: Conjunctivae and EOM are normal. Pupils are equal, round, and reactive to light.  Neck: Neck supple. No thyromegaly present.  Cardiovascular: Normal rate and normal heart sounds.   Pulmonary/Chest: Effort normal and breath sounds normal.  Lymphadenopathy:    He has no cervical adenopathy.  Neurological: He is alert and oriented to person, place, and time.  Skin: Skin is warm and dry.  Psychiatric: He has a normal mood and affect.          Assessment & Plan:  Sinusitis - likely viral. He is much better.  Call if suddenly gets worse. Symptomatic care.  I also recommended nasal saline moisturizing gel to help the nosebleeds. I don't think it is his Coumadin since he is actually subtherapeutic today.   Chronic back and knee pain - He was recently released by Dr. Corinna Capra. Discussed that we originally quit rx his narcotics because he was getting it filled here and at his ortho office.  I will check the resitry. He says only one rx overlapped.  Had to get ortho to riwt his meds bc was a workers comp case.      A-fib - See coumadin flowsheet.  INR not at goal but missed 3 doses.  Recheck in one week.   He needs to make an appointment in the next couple weeks for blood pressure and diabetes followup.He has applied for financial assittance throught [employer].

## 2011-06-12 ENCOUNTER — Other Ambulatory Visit: Payer: Self-pay | Admitting: *Deleted

## 2011-06-12 MED ORDER — LISINOPRIL 10 MG PO TABS: 10.0000 mg | ORAL_TABLET | Freq: Every day | ORAL | Status: DC

## 2011-06-12 MED ORDER — METFORMIN HCL 500 MG PO TABS: 500.0000 mg | ORAL_TABLET | Freq: Two times a day (BID) | ORAL | Status: DC

## 2011-06-12 MED ORDER — DILTIAZEM HCL ER BEADS 120 MG PO CP24: 120.0000 mg | ORAL_CAPSULE | Freq: Every day | ORAL | Status: DC

## 2011-06-12 MED ORDER — CITALOPRAM HYDROBROMIDE 40 MG PO TABS: 40.0000 mg | ORAL_TABLET | Freq: Every day | ORAL | Status: DC

## 2011-06-18 ENCOUNTER — Encounter: Payer: Self-pay | Admitting: Family Medicine

## 2011-06-18 ENCOUNTER — Ambulatory Visit (INDEPENDENT_AMBULATORY_CARE_PROVIDER_SITE_OTHER): Payer: Self-pay | Admitting: Family Medicine

## 2011-06-18 ENCOUNTER — Ambulatory Visit: Payer: Self-pay | Admitting: Family Medicine

## 2011-06-18 DIAGNOSIS — I82409 Acute embolism and thrombosis of unspecified deep veins of unspecified lower extremity: Secondary | ICD-10-CM

## 2011-06-18 DIAGNOSIS — I4891 Unspecified atrial fibrillation: Secondary | ICD-10-CM

## 2011-06-18 DIAGNOSIS — E119 Type 2 diabetes mellitus without complications: Secondary | ICD-10-CM

## 2011-06-18 DIAGNOSIS — I10 Essential (primary) hypertension: Secondary | ICD-10-CM

## 2011-06-18 LAB — POCT INR: INR: 1.4

## 2011-06-18 LAB — POCT GLYCOSYLATED HEMOGLOBIN (HGB A1C): Hemoglobin A1C: 7.6

## 2011-06-18 MED ORDER — METFORMIN HCL 1000 MG PO TABS: 1000.0000 mg | ORAL_TABLET | Freq: Two times a day (BID) | ORAL | Status: DC

## 2011-06-18 MED ORDER — LISINOPRIL 10 MG PO TABS: 10.0000 mg | ORAL_TABLET | Freq: Every day | ORAL | Status: DC

## 2011-06-18 NOTE — Progress Notes (Signed)
  Subjective:    Patient ID: Rodney Bright, male    DOB: 09/14/1960, 51 y.o.   MRN: [id number]  HPI Diabetes - Here for A1C.  Says he plans on starting to work out.    HTN - Says he is not on the lisinopril. Took it for 2 days and felt strange so stopped it.  Says e is willing to try it again but wanted to make sure he needed it.   He is looking for job now that his workers comp is up.    Review of Systems     Objective:   Physical Exam  Constitutional: He is oriented to person, place, and time. He appears well-developed and well-nourished.  HENT:  Head: Normocephalic and atraumatic.  Cardiovascular: Normal rate, regular rhythm and normal heart sounds.   Pulmonary/Chest: Effort normal and breath sounds normal.  Neurological: He is alert and oriented to person, place, and time.  Skin: Skin is warm and dry.  Psychiatric: He has a normal mood and affect. His behavior is normal.          Assessment & Plan:  DM- Not well controlled. Will inc his metformin to 1000 mg bid.  F/u in 3 months.  He is well overdue for lab work but he does not have insurance right now. He is applying for systems are calm. I'm hoping that he will get this between now when I see him back and we can order a CMP and a fasting lipid panel. He also plans on starting to workout at the gym. We discussed symptoms and signs to monitor for his history of A. fib. He will no longer be able to follow his heart rate since he is on calcium channel blocker that will affect his maximum heart rate.  HTN- Not well controlled.  Restart the ACE. Discussed why it is important.  F/U in 3 month.   Chronic Back Pain - Discussed I need to get notes from the ortho that has released him from the workers comp injury and also need to look him up in the registry to find out what he has been taking.

## 2011-06-18 NOTE — Patient Instructions (Signed)
If get [employer] coverage then call me for labs.   See coumadin flowsheet.

## 2011-07-10 ENCOUNTER — Other Ambulatory Visit: Payer: Self-pay | Admitting: *Deleted

## 2011-07-10 NOTE — Telephone Encounter (Signed)
Yes, ok to fill,just verify dose and quantity with the pharmacy and he needs to sign a pain contract.

## 2011-07-10 NOTE — Telephone Encounter (Signed)
Pt is asking for a refill on his hydrocodone. States you told him to call you to see if you received the information you needed. Please advise if I can refill.

## 2011-07-11 ENCOUNTER — Other Ambulatory Visit: Payer: Self-pay | Admitting: *Deleted

## 2011-07-11 ENCOUNTER — Telehealth: Payer: Self-pay | Admitting: *Deleted

## 2011-07-11 MED ORDER — HYDROCODONE-ACETAMINOPHEN 10-325 MG PO TABS: 1.0000 | ORAL_TABLET | Freq: Three times a day (TID) | ORAL | Status: AC | PRN

## 2011-07-11 NOTE — Telephone Encounter (Signed)
Pain Contract Printed

## 2011-07-11 NOTE — Telephone Encounter (Signed)
Pt came in to sign pain contract. Listed his pharmacy as [facility] [address] [place], [place]. Copy of signed contract given to patient.

## 2011-07-11 NOTE — Telephone Encounter (Signed)
Pt has 5 pharmacies listed. Called all pharmacies and the last couple of times were [facility] in [place] he got 90 tabs on 05/18/11 and the last time was with [facility] [place] for 90 tabs on 06/10/11 prescribed by Viviann Spare Pill.

## 2011-07-11 NOTE — Telephone Encounter (Signed)
Ok to fill for 90 tabs. He will have to use only one pharmacy, whatever he designates on his pain contract. He can't take it to multiple places.

## 2011-07-11 NOTE — Telephone Encounter (Signed)
Pt states that he will be here today or tomorrow to sign pain contract.

## 2011-07-30 ENCOUNTER — Telehealth: Payer: Self-pay | Admitting: *Deleted

## 2011-07-30 NOTE — Telephone Encounter (Signed)
Pt is requesting a rx for his Ambien to be filled. Please advise.

## 2011-07-30 NOTE — Telephone Encounter (Signed)
WE have never filled fo him so denied.  We needs records from prior doc who was rx.

## 2011-07-31 NOTE — Telephone Encounter (Signed)
Pt notiied need records. KJ LPN

## 2011-08-02 ENCOUNTER — Telehealth: Payer: Self-pay | Admitting: *Deleted

## 2011-08-02 MED ORDER — ZOLPIDEM TARTRATE 10 MG PO TABS: 10.0000 mg | ORAL_TABLET | Freq: Every evening | ORAL | Status: DC | PRN

## 2011-08-02 MED ORDER — LORAZEPAM 1 MG PO TABS: 1.0000 mg | ORAL_TABLET | Freq: Three times a day (TID) | ORAL | Status: DC

## 2011-08-02 NOTE — Telephone Encounter (Signed)
Received a call from Enedina Finner at [facility] that pt was also taking Lorazepam 1 mg TID. In addition to the Ambien and Celexa.

## 2011-08-07 ENCOUNTER — Other Ambulatory Visit: Payer: Self-pay | Admitting: Family Medicine

## 2011-08-08 ENCOUNTER — Telehealth: Payer: Self-pay | Admitting: *Deleted

## 2011-08-09 ENCOUNTER — Other Ambulatory Visit: Payer: Self-pay | Admitting: *Deleted

## 2011-08-09 MED ORDER — WARFARIN SODIUM 5 MG PO TABS: 5.0000 mg | ORAL_TABLET | Freq: Every day | ORAL | Status: DC

## 2011-08-09 MED ORDER — DILTIAZEM HCL ER BEADS 120 MG PO CP24: 120.0000 mg | ORAL_CAPSULE | Freq: Every day | ORAL | Status: DC

## 2011-09-03 ENCOUNTER — Other Ambulatory Visit: Payer: Self-pay | Admitting: *Deleted

## 2011-09-03 ENCOUNTER — Other Ambulatory Visit: Payer: Self-pay | Admitting: Family Medicine

## 2011-09-03 MED ORDER — ZOLPIDEM TARTRATE 10 MG PO TABS: 10.0000 mg | ORAL_TABLET | Freq: Every evening | ORAL | Status: DC | PRN

## 2011-09-04 ENCOUNTER — Other Ambulatory Visit: Payer: Self-pay | Admitting: Family Medicine

## 2011-09-06 ENCOUNTER — Telehealth: Payer: Self-pay | Admitting: *Deleted

## 2011-09-06 MED ORDER — HYDROCODONE-ACETAMINOPHEN 10-325 MG PO TABS: 1.0000 | ORAL_TABLET | Freq: Three times a day (TID) | ORAL | Status: DC | PRN

## 2011-09-06 NOTE — Telephone Encounter (Signed)
Pt is requesting refill on Norco. Please advise if I can fill.

## 2011-09-06 NOTE — Telephone Encounter (Signed)
Ok to fill. I printed new rx. Will fax today.

## 2011-09-13 ENCOUNTER — Other Ambulatory Visit: Payer: Self-pay | Admitting: *Deleted

## 2011-09-13 MED ORDER — CITALOPRAM HYDROBROMIDE 40 MG PO TABS: 40.0000 mg | ORAL_TABLET | Freq: Every day | ORAL | Status: DC

## 2011-09-30 ENCOUNTER — Other Ambulatory Visit: Payer: Self-pay | Admitting: Family Medicine

## 2011-10-04 ENCOUNTER — Other Ambulatory Visit: Payer: Self-pay | Admitting: *Deleted

## 2011-10-04 MED ORDER — HYDROCODONE-ACETAMINOPHEN 10-325 MG PO TABS
1.0000 | ORAL_TABLET | Freq: Three times a day (TID) | ORAL | Status: DC | PRN
Start: 2011-10-04 — End: 2011-11-01

## 2011-10-04 MED ORDER — ZOLPIDEM TARTRATE 10 MG PO TABS: 10.0000 mg | ORAL_TABLET | Freq: Every evening | ORAL | Status: DC | PRN

## 2011-10-14 ENCOUNTER — Other Ambulatory Visit: Payer: Self-pay | Admitting: Family Medicine

## 2011-10-25 ENCOUNTER — Ambulatory Visit: Payer: Self-pay | Admitting: Family Medicine

## 2011-10-25 ENCOUNTER — Encounter: Payer: Self-pay | Admitting: Family Medicine

## 2011-10-25 ENCOUNTER — Ambulatory Visit (INDEPENDENT_AMBULATORY_CARE_PROVIDER_SITE_OTHER): Payer: Self-pay | Admitting: Family Medicine

## 2011-10-25 VITALS — BP 127/82 | HR 75 | Ht 69.0 in | Wt >= 6400 oz

## 2011-10-25 DIAGNOSIS — M549 Dorsalgia, unspecified: Secondary | ICD-10-CM

## 2011-10-25 DIAGNOSIS — F341 Dysthymic disorder: Secondary | ICD-10-CM

## 2011-10-25 DIAGNOSIS — E119 Type 2 diabetes mellitus without complications: Secondary | ICD-10-CM

## 2011-10-25 DIAGNOSIS — M109 Gout, unspecified: Secondary | ICD-10-CM

## 2011-10-25 DIAGNOSIS — I4891 Unspecified atrial fibrillation: Secondary | ICD-10-CM

## 2011-10-25 DIAGNOSIS — F418 Other specified anxiety disorders: Secondary | ICD-10-CM

## 2011-10-25 DIAGNOSIS — G8929 Other chronic pain: Secondary | ICD-10-CM

## 2011-10-25 DIAGNOSIS — E669 Obesity, unspecified: Secondary | ICD-10-CM

## 2011-10-25 LAB — POCT INR: INR: 1.3

## 2011-10-25 LAB — POCT GLYCOSYLATED HEMOGLOBIN (HGB A1C): Hemoglobin A1C: 6.6

## 2011-10-25 MED ORDER — ALLOPURINOL 100 MG PO TABS: 100.0000 mg | ORAL_TABLET | Freq: Every day | ORAL | Status: DC

## 2011-10-25 MED ORDER — ZOLPIDEM TARTRATE 10 MG PO TABS: 10.0000 mg | ORAL_TABLET | Freq: Every evening | ORAL | Status: DC | PRN

## 2011-10-25 MED ORDER — COLCHICINE 0.6 MG PO TABS: 0.6000 mg | ORAL_TABLET | Freq: Every day | ORAL | Status: DC

## 2011-10-25 MED ORDER — CITALOPRAM HYDROBROMIDE 40 MG PO TABS: 40.0000 mg | ORAL_TABLET | Freq: Every day | ORAL | Status: DC

## 2011-10-25 MED ORDER — DICLOFENAC SODIUM 1 % TD GEL: 1.0000 "application " | Freq: Four times a day (QID) | TRANSDERMAL | Status: DC

## 2011-10-25 MED ORDER — WARFARIN SODIUM 5 MG PO TABS: 5.0000 mg | ORAL_TABLET | Freq: Every day | ORAL | Status: DC

## 2011-10-25 NOTE — Patient Instructions (Addendum)
Great job on the [employer] loss Try  Increasing your metformin to twice a day.If don't tolerate it then go back to once a day.

## 2011-10-25 NOTE — Progress Notes (Signed)
Subjective:    Patient ID: Rodney Bright, male    DOB: [birth year], 51 y.o.   MRN: [id number]  HPI DM- Taking his metformin once a week.  No low events.    Chronic Pain - Has been taking an extra hydrocodone the last week because of inc pain in his back.    Gout -Flaring right now in his gout.  At some cherries to help. Would like to restart his allopurinol and cholchine. Has been on it  Obesity - Has had gastric bypass.  Says the gluocphage has really helped.  Has lost 17 lbs. Says no regular exercise.    Depression and anxiety-he would like to go back to 5 tabs of the citalopram. This is what his psychiatrist had him on. He reports that they have him on higher doses because he felt like he was not absorbing it normally because of his history of gastric bypass. He felt much better controlled as far as his irritability and anger. He has noticed that his anger is triggered more quickly and it takes him longer to calm down. He denies feeling depressed currently. He used to be on Lexapro 20 mg, 3 tabs a day for a total of 60 mg and felt like it worked better than the citalopram. He switched at one point in time because of cost.  Review of Systems     Objective:   Physical Exam  Constitutional: He is oriented to person, place, and time. He appears well-developed and well-nourished.  HENT:  Head: Normocephalic and atraumatic.  Cardiovascular: Normal rate, regular rhythm and normal heart sounds.   Pulmonary/Chest: Effort normal and breath sounds normal.  Neurological: He is alert and oriented to person, place, and time.  Skin: Skin is warm and dry.  Psychiatric: He has a normal mood and affect. His behavior is normal.          Assessment & Plan:  DM- well controlled. Pain is really well controlled. Increase metformin to BID. A1C is 6.6.  F/U in 3-4 months.  Due for foot exam at followup.   Lab Results  Component Value Date   HGBA1C 6.6 10/25/2011    Obesity - Has lost 17 lbs which  is fantastic.  I did encourage him to try to get more exercise. I think the metformin has really made a big impact in helping to suppress his appetite. Going to twice a day may help even more.  Gout- Will restart her allopurinol and colchincine.  I would like to check a uric acid but right now he does not have insurance.  Chronic pain - Can call for refill next week. Hopefully controlling his gout will help.  I'm not going to increase his regimen at this time.  Depression - Will continue citalopram but he wants to go back up to 5 tabs which he was previously taking. I explained to him that I would not go up on the dose of his medication. I do not feel it is appropriate to go above the FDA approved indication based on my particular training. Certainly if he wants to go back and see his psychiatrist who is a specialist in that area he does feel comfortable with higher doses than that is fine. Patient is he cannot afford to do that right now. I did discuss possibly switching back to Lexapro which does now have a generic available but it would only be 20 mg at the max. I did warn him of prolonged QT which can lead  [place] put him in high risk for death.  Atrial fibrillation-he is on Coumadin and says he has been taking it regularly. Unfortunately he did not followup when he was supposed to have a subtherapeutic last time. I reminded him again the importance of coming in to have his Coumadin monitored so that we can get him therapeutic between INR of 2-3. I did strongly encourage him and asked him to please followup in one week so that we can get this right. Also explained to him again that once we do get this medication regulated we'll still have to be monitored and followed as INR does go up and down depending on what he eats and weight loss et Karie Soda.

## 2011-10-28 ENCOUNTER — Telehealth: Payer: Self-pay | Admitting: *Deleted

## 2011-10-28 NOTE — Telephone Encounter (Signed)
Med given to him on Friday for gout- took it Saturday and face flushed and swollen and couldn't breathe- went to Ed- given benedryl and started feeling better. Pt given steroids by the ED and hasn't taken them because he is already on the testosterone- is this ok to take together?  Pt is taking the gout med he was on

## 2011-10-28 NOTE — Telephone Encounter (Signed)
Pt aware and it was colchicine

## 2011-10-28 NOTE — Telephone Encounter (Signed)
OK to fill the prednisone.  Was it the colchicine he reacted too? If so lets add to allergy list.

## 2011-11-01 ENCOUNTER — Encounter: Payer: Self-pay | Admitting: Family Medicine

## 2011-11-01 ENCOUNTER — Ambulatory Visit (INDEPENDENT_AMBULATORY_CARE_PROVIDER_SITE_OTHER): Payer: Self-pay | Admitting: Family Medicine

## 2011-11-01 ENCOUNTER — Ambulatory Visit: Payer: Self-pay

## 2011-11-01 ENCOUNTER — Ambulatory Visit: Payer: Self-pay | Admitting: Family Medicine

## 2011-11-01 ENCOUNTER — Other Ambulatory Visit: Payer: Self-pay | Admitting: *Deleted

## 2011-11-01 VITALS — BP 121/76 | HR 78 | Resp 18 | Ht 68.25 in | Wt >= 6400 oz

## 2011-11-01 DIAGNOSIS — I4891 Unspecified atrial fibrillation: Secondary | ICD-10-CM

## 2011-11-01 DIAGNOSIS — F39 Unspecified mood [affective] disorder: Secondary | ICD-10-CM

## 2011-11-01 DIAGNOSIS — E669 Obesity, unspecified: Secondary | ICD-10-CM

## 2011-11-01 LAB — POCT INR: INR: 1.8

## 2011-11-01 MED ORDER — INDOMETHACIN 50 MG PO CAPS: 50.0000 mg | ORAL_CAPSULE | Freq: Three times a day (TID) | ORAL | Status: AC

## 2011-11-01 MED ORDER — ESCITALOPRAM OXALATE 20 MG PO TABS: 20.0000 mg | ORAL_TABLET | Freq: Every day | ORAL | Status: DC

## 2011-11-01 MED ORDER — HYDROCODONE-ACETAMINOPHEN 10-325 MG PO TABS: 1.0000 | ORAL_TABLET | Freq: Three times a day (TID) | ORAL | Status: DC | PRN

## 2011-11-01 NOTE — Patient Instructions (Addendum)
We will call with the referral information.

## 2011-11-01 NOTE — Progress Notes (Signed)
  Subjective:    Patient ID: Rodney Bright, male    DOB: 11-29-1960, 51 y.o.   MRN: [id number]  HPI Gout - Took colchine and face started swelling. He also had shortness of breath. Had to go to ED.  he is feeling much better now. He says he used to take Indocin and then wants and if he can start that. He tolerated that well in the past. He is never had an allergic reaction before.  Obesity-he has continued to work on diet. He's not actively exercising yet.  Mood - He check with [facility] and the lexapro is reasonable. Will switch to lexapro.  The he would like to send a prescription to [facility] for now. Though he let me know he will eventually be switching to [facility].  Review of Systems     Objective:   Physical Exam  Constitutional: He is oriented to person, place, and time. He appears well-developed and well-nourished.  HENT:  Head: Normocephalic and atraumatic.  Cardiovascular: Normal rate, regular rhythm and normal heart sounds.   Pulmonary/Chest: Effort normal and breath sounds normal.  Neurological: He is alert and oriented to person, place, and time.  Skin: Skin is warm and dry.  Psychiatric: He has a normal mood and affect. His behavior is normal.          Assessment & Plan:  Anticoag - please see if regulation flowsheet for adjustments. Followup in one week.  Morbid Obesity - Has lost 14 lbs since I last saw him.  This is fantastic. Keep up the good work. Blood pressure looks fantastic today.  Mood - Will i changed him to Lexapro 20mg .  new prescriptions sent. He was on much higher doses with a psychiatrist. We discussed the option of trying to get him in with psychiatry here in our building. Admittedly he does have a history of bariatric surgery which could certainly affect the absorption of medications that he may actually need higher doses but I feel that this is out of my normal practice and encouraged him to see a psychiatrist. He is also interested in counseling so we  will get him referred for this as well.

## 2011-11-08 ENCOUNTER — Encounter (HOSPITAL_COMMUNITY): Payer: Self-pay | Admitting: Psychiatry

## 2011-11-08 ENCOUNTER — Ambulatory Visit (INDEPENDENT_AMBULATORY_CARE_PROVIDER_SITE_OTHER): Payer: Self-pay | Admitting: Psychiatry

## 2011-11-08 ENCOUNTER — Ambulatory Visit: Payer: Self-pay

## 2011-11-08 VITALS — BP 130/89 | HR 71 | Ht 68.0 in | Wt >= 6400 oz

## 2011-11-08 DIAGNOSIS — F332 Major depressive disorder, recurrent severe without psychotic features: Secondary | ICD-10-CM

## 2011-11-08 DIAGNOSIS — F329 Major depressive disorder, single episode, unspecified: Secondary | ICD-10-CM

## 2011-11-08 DIAGNOSIS — F1910 Other psychoactive substance abuse, uncomplicated: Secondary | ICD-10-CM
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
## 2011-11-10 DIAGNOSIS — F329 Major depressive disorder, single episode, unspecified: Secondary | ICD-10-CM | POA: Insufficient documentation

## 2011-11-13 ENCOUNTER — Encounter: Payer: Self-pay | Admitting: *Deleted

## 2011-11-19 ENCOUNTER — Ambulatory Visit (INDEPENDENT_AMBULATORY_CARE_PROVIDER_SITE_OTHER): Payer: Self-pay | Admitting: Behavioral Health

## 2011-11-19 DIAGNOSIS — F331 Major depressive disorder, recurrent, moderate: Secondary | ICD-10-CM

## 2011-11-20 ENCOUNTER — Encounter (HOSPITAL_COMMUNITY): Payer: Self-pay | Admitting: Behavioral Health
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
## 2011-11-22 ENCOUNTER — Encounter (HOSPITAL_COMMUNITY): Payer: Self-pay | Admitting: Psychiatry

## 2011-11-22 ENCOUNTER — Ambulatory Visit (INDEPENDENT_AMBULATORY_CARE_PROVIDER_SITE_OTHER): Payer: Self-pay | Admitting: Psychiatry

## 2011-11-22 VITALS — BP 134/80 | HR 71 | Ht 68.25 in | Wt >= 6400 oz

## 2011-11-22 DIAGNOSIS — F1911 Other psychoactive substance abuse, in remission: Secondary | ICD-10-CM

## 2011-11-22 DIAGNOSIS — F329 Major depressive disorder, single episode, unspecified: Secondary | ICD-10-CM

## 2011-11-22 DIAGNOSIS — F332 Major depressive disorder, recurrent severe without psychotic features: Secondary | ICD-10-CM

## 2011-11-22 MED ORDER — ESCITALOPRAM OXALATE 20 MG PO TABS: ORAL_TABLET | ORAL | Status: DC
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
## 2011-11-23 LAB — COMPLETE METABOLIC PANEL WITH GFR
ALT: 28 U/L (ref 0–53)
AST: 25 U/L (ref 0–37)
Albumin: 4.4 g/dL (ref 3.5–5.2)
Alkaline Phosphatase: 85 U/L (ref 39–117)
BUN: 21 mg/dL (ref 6–23)
CO2: 23 mEq/L (ref 19–32)
Calcium: 9.3 mg/dL (ref 8.4–10.5)
Chloride: 105 mEq/L (ref 96–112)
Creat: 1.23 mg/dL (ref 0.50–1.35)
GFR, Est African American: 77 mL/min
GFR, Est Non African American: 67 mL/min
Glucose, Bld: 114 mg/dL — ABNORMAL HIGH (ref 70–99)
Potassium: 4.6 mEq/L (ref 3.5–5.3)
Sodium: 139 mEq/L (ref 135–145)
Total Bilirubin: 0.5 mg/dL (ref 0.3–1.2)
Total Protein: 6.8 g/dL (ref 6.0–8.3)

## 2011-11-23 LAB — LIPID PANEL
Cholesterol: 147 mg/dL (ref 0–200)
HDL: 33 mg/dL — ABNORMAL LOW (ref >39)
LDL Cholesterol: 66 mg/dL (ref 0–99)
Total CHOL/HDL Ratio: 4.5 Ratio
Triglycerides: 241 mg/dL — ABNORMAL HIGH (ref <150)
VLDL: 48 mg/dL — ABNORMAL HIGH (ref 0–40)

## 2011-12-02 ENCOUNTER — Other Ambulatory Visit: Payer: Self-pay | Admitting: *Deleted

## 2011-12-02 NOTE — Telephone Encounter (Signed)
Pt called requesting refill of hydrocodone 

## 2011-12-03 MED ORDER — HYDROCODONE-ACETAMINOPHEN 10-325 MG PO TABS: 1.0000 | ORAL_TABLET | Freq: Three times a day (TID) | ORAL | Status: DC | PRN

## 2011-12-04 ENCOUNTER — Ambulatory Visit (INDEPENDENT_AMBULATORY_CARE_PROVIDER_SITE_OTHER): Payer: Self-pay | Admitting: Behavioral Health

## 2011-12-04 ENCOUNTER — Encounter (HOSPITAL_COMMUNITY): Payer: Self-pay | Admitting: Behavioral Health

## 2011-12-04 DIAGNOSIS — F331 Major depressive disorder, recurrent, moderate: Secondary | ICD-10-CM
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
## 2011-12-11 ENCOUNTER — Ambulatory Visit (INDEPENDENT_AMBULATORY_CARE_PROVIDER_SITE_OTHER): Payer: Self-pay | Admitting: Family Medicine

## 2011-12-11 ENCOUNTER — Ambulatory Visit (INDEPENDENT_AMBULATORY_CARE_PROVIDER_SITE_OTHER): Payer: Self-pay | Admitting: Psychiatry

## 2011-12-11 ENCOUNTER — Encounter (HOSPITAL_COMMUNITY): Payer: Self-pay | Admitting: Psychiatry

## 2011-12-11 VITALS — BP 136/81 | HR 76

## 2011-12-11 VITALS — BP 129/80 | HR 86 | Ht 68.25 in | Wt 399.0 lb

## 2011-12-11 DIAGNOSIS — F332 Major depressive disorder, recurrent severe without psychotic features: Secondary | ICD-10-CM

## 2011-12-11 DIAGNOSIS — F329 Major depressive disorder, single episode, unspecified: Secondary | ICD-10-CM

## 2011-12-11 DIAGNOSIS — I4891 Unspecified atrial fibrillation: Secondary | ICD-10-CM

## 2011-12-11 LAB — POCT INR: INR: 1.5

## 2011-12-11 MED ORDER — ESCITALOPRAM OXALATE 20 MG PO TABS: ORAL_TABLET | ORAL | Status: DC

## 2011-12-11 NOTE — Progress Notes (Signed)
Pt informed
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
## 2011-12-11 NOTE — Progress Notes (Signed)
  Subjective:    Patient ID: Rodney Bright, male    DOB: Aug 03, 1960, 51 y.o.   MRN: [id number]  HPI  Pt denies any excessive bruising or bleeding. 5 mins spent with pt.Pt states he is taking 7.5mg  every day   Review of Systems     Objective:   Physical Exam        Assessment & Plan:

## 2011-12-13 ENCOUNTER — Ambulatory Visit (INDEPENDENT_AMBULATORY_CARE_PROVIDER_SITE_OTHER): Payer: Self-pay | Admitting: Behavioral Health

## 2011-12-13 ENCOUNTER — Encounter (HOSPITAL_COMMUNITY): Payer: Self-pay | Admitting: Behavioral Health

## 2011-12-13 DIAGNOSIS — F339 Major depressive disorder, recurrent, unspecified: Secondary | ICD-10-CM
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
## 2011-12-18 ENCOUNTER — Other Ambulatory Visit: Payer: Self-pay | Admitting: Family Medicine

## 2011-12-20 ENCOUNTER — Telehealth (HOSPITAL_COMMUNITY): Payer: Self-pay

## 2011-12-20 ENCOUNTER — Telehealth: Payer: Self-pay | Admitting: *Deleted

## 2011-12-20 MED ORDER — ZOLPIDEM TARTRATE 5 MG PO TABS: 5.0000 mg | ORAL_TABLET | Freq: Every evening | ORAL | Status: DC | PRN

## 2011-12-20 NOTE — Telephone Encounter (Signed)
Can send rx for ambien to [facility] in [place] please.

## 2011-12-20 NOTE — Telephone Encounter (Signed)
Pt called and wanted a refill for ambien. Per Dr. Nyoka Cowden pt will need to be weaned off of ambien so dr. Linford Arnold will send in [place] 5 and pt notified

## 2011-12-20 NOTE — Telephone Encounter (Signed)
rx sent for 5mg  as he is being weaned bc of his sleep apnea.

## 2011-12-20 NOTE — Telephone Encounter (Signed)
Called patient. The patient called requesting Ambien. The patient has admitted to taking 15 mg at a time. I recommended not taking more than 10 mg at bedtime. Not taking this medication with food as that could affect it's absorption.  As noted previously the patient was advised that I would not use this medication for a long period of time. The patient reports that he still has trouble sleeping.  PLAN: Recommend referral to a sleep specialist. Having spoken to PCP about the same. The patient has not be reevaluated in 3 years.

## 2011-12-26 ENCOUNTER — Other Ambulatory Visit: Payer: Self-pay | Admitting: Family Medicine

## 2012-01-01 ENCOUNTER — Other Ambulatory Visit: Payer: Self-pay | Admitting: Family Medicine

## 2012-01-03 ENCOUNTER — Ambulatory Visit (HOSPITAL_COMMUNITY): Payer: Self-pay | Admitting: Behavioral Health

## 2012-01-13 ENCOUNTER — Encounter (HOSPITAL_COMMUNITY): Payer: Self-pay | Admitting: Behavioral Health

## 2012-01-13 ENCOUNTER — Ambulatory Visit (INDEPENDENT_AMBULATORY_CARE_PROVIDER_SITE_OTHER): Payer: Self-pay | Admitting: Behavioral Health

## 2012-01-13 DIAGNOSIS — F331 Major depressive disorder, recurrent, moderate: Secondary | ICD-10-CM
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
## 2012-01-17 ENCOUNTER — Ambulatory Visit (HOSPITAL_COMMUNITY): Payer: Self-pay | Admitting: Behavioral Health

## 2012-01-27 ENCOUNTER — Ambulatory Visit (HOSPITAL_COMMUNITY): Payer: Self-pay | Admitting: Behavioral Health

## 2012-01-30 ENCOUNTER — Other Ambulatory Visit: Payer: Self-pay | Admitting: Family Medicine

## 2012-01-30 ENCOUNTER — Other Ambulatory Visit: Payer: Self-pay | Admitting: Physician Assistant

## 2012-01-31 NOTE — Telephone Encounter (Signed)
This patient sees psych as well.

## 2012-02-03 ENCOUNTER — Ambulatory Visit (INDEPENDENT_AMBULATORY_CARE_PROVIDER_SITE_OTHER): Payer: Self-pay | Admitting: Behavioral Health

## 2012-02-03 ENCOUNTER — Encounter (HOSPITAL_COMMUNITY): Payer: Self-pay | Admitting: Behavioral Health

## 2012-02-03 DIAGNOSIS — F411 Generalized anxiety disorder: Secondary | ICD-10-CM
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
## 2012-02-05 ENCOUNTER — Other Ambulatory Visit: Payer: Self-pay | Admitting: Family Medicine

## 2012-02-06 NOTE — Telephone Encounter (Signed)
Decline.  Filling it too early.

## 2012-02-07 ENCOUNTER — Other Ambulatory Visit: Payer: Self-pay | Admitting: *Deleted

## 2012-02-07 NOTE — Telephone Encounter (Signed)
The earliest I will fill it is 02/26/12.  I gave him this quantitiy to save on costs if he remembers.

## 2012-02-07 NOTE — Telephone Encounter (Signed)
Pt calls asking why lorazepam was denied. I explained to Pt that #90 given in Sept was for 90 day supply. Pt states that directions are for him to take every 8 hours as needed and sometimes he needs them more than other times. He states 90 tabs can last 2-3 months but occasionally he will go thru all 90 w/in 1-2 months. Pt is out and needs refill. Please advise.

## 2012-02-10 ENCOUNTER — Ambulatory Visit (INDEPENDENT_AMBULATORY_CARE_PROVIDER_SITE_OTHER): Payer: Self-pay | Admitting: Psychiatry

## 2012-02-10 ENCOUNTER — Encounter (HOSPITAL_COMMUNITY): Payer: Self-pay | Admitting: Psychiatry

## 2012-02-10 VITALS — BP 139/79 | HR 75 | Ht 68.25 in | Wt >= 6400 oz

## 2012-02-10 DIAGNOSIS — F332 Major depressive disorder, recurrent severe without psychotic features: Secondary | ICD-10-CM

## 2012-02-10 DIAGNOSIS — F329 Major depressive disorder, single episode, unspecified: Secondary | ICD-10-CM
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
## 2012-02-10 NOTE — Telephone Encounter (Signed)
LM of MD instructions. KG LPN

## 2012-02-17 ENCOUNTER — Encounter (HOSPITAL_COMMUNITY): Payer: Self-pay | Admitting: Behavioral Health

## 2012-02-17 ENCOUNTER — Ambulatory Visit (INDEPENDENT_AMBULATORY_CARE_PROVIDER_SITE_OTHER): Payer: Self-pay | Admitting: Behavioral Health

## 2012-02-17 DIAGNOSIS — F331 Major depressive disorder, recurrent, moderate: Secondary | ICD-10-CM

## 2012-02-17 NOTE — Progress Notes (Signed)
   THERAPIST PROGRESS NOTE  Session Time: 11:00  Participation Level: Active  Behavioral Response: CasualAlertDepressed  Type of Therapy: Individual Therapy  Treatment Goals addressed: Coping  Interventions: CBT  Summary: Rodney Bright is a 51 y.o. male who presents with depression.   Suicidal/Homicidal: Nowithout intent/plan  Therapist Response: The client indicated that for the most part things have been going smoothly. He did indicate that he had been thinking about what a healthy relationship looks like. He indicated that most of his based on the fact that he has not had healthy relationships with women including his mother, sisters, and wife. He indicated that when it comes down to his trust, honesty and she anyone else. He indicates that he is who he is and is confused when he is who he is and the women in his life appear to criticize him for that. He did admit to some thoughts in saying that he could have done something differently in his marriage but felt that when he was honest with his wife she at times didn't know what to do with that. He indicates that he values honesty and fidelity. He indicates that neither he nor his wife are unfaithful during her marriage. He indicates that he is starting to do relationships differently saying that after his marriage he was looking for physical gratification but he is looking more for emotional gratification and appreciated the opportunity to think through what a healthy relationship looks like to him. I encouraged him to continue all of his thought processes we will discuss that in the next session. Indicates that his sleep is still a good as he would like but has been somewhat better. He indicates no trauma in terms of work. He indicates that he would like to  be involved in healthy relationship but recognizes that he may not have a significant amount of time to do that now or he begins school again in 2014. 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  Plan: Return again in 2 weeks.  Diagnosis: Axis I: 161,09    Axis II: Deferred    French Ana, [facility] 02/17/2012

## 2012-02-21 ENCOUNTER — Other Ambulatory Visit: Payer: Self-pay | Admitting: Family Medicine

## 2012-02-27 ENCOUNTER — Other Ambulatory Visit: Payer: Self-pay | Admitting: Family Medicine

## 2012-02-28 NOTE — Telephone Encounter (Signed)
Is this ok for him

## 2012-03-04 ENCOUNTER — Telehealth: Payer: Self-pay | Admitting: *Deleted

## 2012-03-04 ENCOUNTER — Other Ambulatory Visit: Payer: Self-pay | Admitting: Family Medicine

## 2012-03-04 ENCOUNTER — Other Ambulatory Visit: Payer: Self-pay | Admitting: *Deleted

## 2012-03-04 NOTE — Telephone Encounter (Signed)
Cannot refill until due and we don't do 90 days supply on this.

## 2012-03-04 NOTE — Telephone Encounter (Signed)
Is this ok to fill? 

## 2012-03-04 NOTE — Telephone Encounter (Signed)
Patient requesting a refill on Ativan but was given a 90 day supply on 12/26/11  I told him it was to early for this refill but he insisted on me talking with you about refill now

## 2012-03-09 ENCOUNTER — Ambulatory Visit (HOSPITAL_COMMUNITY): Payer: Self-pay | Admitting: Behavioral Health

## 2012-03-09 ENCOUNTER — Other Ambulatory Visit: Payer: Self-pay | Admitting: *Deleted

## 2012-03-10 ENCOUNTER — Other Ambulatory Visit: Payer: Self-pay | Admitting: *Deleted

## 2012-03-12 ENCOUNTER — Ambulatory Visit (HOSPITAL_COMMUNITY): Payer: Self-pay | Admitting: Psychiatry

## 2012-03-16 ENCOUNTER — Ambulatory Visit (HOSPITAL_COMMUNITY): Payer: Self-pay | Admitting: Psychiatry

## 2012-03-26 ENCOUNTER — Other Ambulatory Visit: Payer: Self-pay | Admitting: *Deleted

## 2012-03-26 NOTE — Telephone Encounter (Signed)
OK to fill

## 2012-03-26 NOTE — Telephone Encounter (Signed)
Pt calls and request a refill on his hydrocodone. Changing pharmacies- send to [facility] in [place]

## 2012-03-27 ENCOUNTER — Other Ambulatory Visit: Payer: Self-pay | Admitting: *Deleted

## 2012-03-27 MED ORDER — HYDROCODONE-ACETAMINOPHEN 10-325 MG PO TABS: 1.0000 | ORAL_TABLET | Freq: Three times a day (TID) | ORAL | Status: DC | PRN

## 2012-03-27 MED ORDER — WARFARIN SODIUM 5 MG PO TABS: 5.0000 mg | ORAL_TABLET | Freq: Every day | ORAL | Status: DC

## 2012-04-02 ENCOUNTER — Other Ambulatory Visit (HOSPITAL_COMMUNITY): Payer: Self-pay

## 2012-04-02 DIAGNOSIS — F329 Major depressive disorder, single episode, unspecified: Secondary | ICD-10-CM

## 2012-04-02 MED ORDER — ESCITALOPRAM OXALATE 20 MG PO TABS: ORAL_TABLET | ORAL | Status: DC

## 2012-04-02 NOTE — Telephone Encounter (Signed)
Pharmacy called requesting refill of escitalopram.   PLAN: Refilled Escitalopram 20 mg BID.

## 2012-04-02 NOTE — Telephone Encounter (Signed)
Called pharmacy. The patient picked up his prescription at [facility].   PLAN: Will cancel lexapro prescription sent to [facility].

## 2012-04-03 ENCOUNTER — Encounter (HOSPITAL_COMMUNITY): Payer: Self-pay | Admitting: Psychiatry

## 2012-04-03 ENCOUNTER — Ambulatory Visit (INDEPENDENT_AMBULATORY_CARE_PROVIDER_SITE_OTHER): Payer: Self-pay | Admitting: Psychiatry

## 2012-04-03 VITALS — BP 129/84 | HR 72 | Ht 68.25 in | Wt >= 6400 oz

## 2012-04-03 DIAGNOSIS — F332 Major depressive disorder, recurrent severe without psychotic features: Secondary | ICD-10-CM

## 2012-04-03 DIAGNOSIS — F1921 Other psychoactive substance dependence, in remission: Secondary | ICD-10-CM

## 2012-04-03 DIAGNOSIS — F329 Major depressive disorder, single episode, unspecified: Secondary | ICD-10-CM

## 2012-04-03 MED ORDER — ESCITALOPRAM OXALATE 20 MG PO TABS: 20.0000 mg | ORAL_TABLET | Freq: Every day | ORAL | Status: DC
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
## 2012-04-07 ENCOUNTER — Telehealth (HOSPITAL_COMMUNITY): Payer: Self-pay

## 2012-04-07 NOTE — Telephone Encounter (Signed)
Called patient on the home phone. Will not give patient a call a work phone given sensitive nature of conversations and that this is a Information systems manager general number.

## 2012-04-09 ENCOUNTER — Telehealth (HOSPITAL_COMMUNITY): Payer: Self-pay | Admitting: Psychiatry

## 2012-04-09 NOTE — Telephone Encounter (Signed)
Patient is currently taking 20 mg of escitalopram. He would like to be on a higher dose. He states he would be able to afford this medication through [facility].  PLAN: Advised patient to schedule an appointment with  Dr. Linford Arnold, to have and ECG done prior to increasing the dosage of escitalopram.

## 2012-04-09 NOTE — Telephone Encounter (Signed)
Called patient. Left message to call the clinic back.

## 2012-04-14 ENCOUNTER — Encounter (HOSPITAL_COMMUNITY): Payer: Self-pay | Admitting: Behavioral Health

## 2012-04-14 ENCOUNTER — Ambulatory Visit (INDEPENDENT_AMBULATORY_CARE_PROVIDER_SITE_OTHER): Payer: Self-pay | Admitting: Family Medicine

## 2012-04-14 ENCOUNTER — Ambulatory Visit: Payer: Self-pay | Admitting: Family Medicine

## 2012-04-14 ENCOUNTER — Ambulatory Visit (INDEPENDENT_AMBULATORY_CARE_PROVIDER_SITE_OTHER): Payer: Self-pay | Admitting: Behavioral Health

## 2012-04-14 ENCOUNTER — Encounter: Payer: Self-pay | Admitting: Family Medicine

## 2012-04-14 VITALS — BP 131/73 | HR 74 | Resp 18 | Wt >= 6400 oz

## 2012-04-14 DIAGNOSIS — F331 Major depressive disorder, recurrent, moderate: Secondary | ICD-10-CM

## 2012-04-14 DIAGNOSIS — I4891 Unspecified atrial fibrillation: Secondary | ICD-10-CM

## 2012-04-14 DIAGNOSIS — E119 Type 2 diabetes mellitus without complications: Secondary | ICD-10-CM

## 2012-04-14 DIAGNOSIS — F329 Major depressive disorder, single episode, unspecified: Secondary | ICD-10-CM

## 2012-04-14 DIAGNOSIS — R040 Epistaxis: Secondary | ICD-10-CM

## 2012-04-14 DIAGNOSIS — I10 Essential (primary) hypertension: Secondary | ICD-10-CM

## 2012-04-14 LAB — POCT UA - MICROALBUMIN
Albumin/Creatinine Ratio, Urine, POC: <30
Creatinine, POC: 100 mg/dL
Microalbumin Ur, POC: 10 mg/dL

## 2012-04-14 LAB — POCT GLYCOSYLATED HEMOGLOBIN (HGB A1C): Hemoglobin A1C: 6.4

## 2012-04-14 LAB — POCT INR: INR: 2

## 2012-04-14 NOTE — Progress Notes (Signed)
Subjective:    Patient ID: Rodney Bright, male    DOB: 03-Nov-1960, 52 y.o.   MRN: [id number]  HPI DM- not checking sugars bc of cost of medication.  No regulr exercise. Just working.  As far as he knows no hypoglycemic events. No cuts or sores that are not healing well.  A-fib- No easy bruising or bleeding.  No recent ABX. He has noticed that he gets dizzy if he tries to exercise his heart rate increases. He is taking his diltiazem regularly. He is interested in trying a newer medication instead of Coumadin. A right now he does not have drug coverage.  HTN- Pt denies chest pain, SOB, dizziness, or heart palpitations.  Taking meds as directed w/o problems.  Denies medication side effects.    Having alot of blood and scab in his nose. Has been going on for over a year.  Thought may be related to his apnea.  He would like to see an ENT.    Review of Systems BP 131/73  Pulse 74  Resp 18  Wt 402 lb (182.346 kg)  SpO2 98%    Allergies  Allergen Reactions  . Colchicine Shortness Of Breath and Swelling    Past Medical History  Diagnosis Date  . Atrial fibrillation   . Lymphadenopathy   . Testosterone deficiency   . Anemia   . Vitamin D deficiency   . ED (erectile dysfunction)   . Morbid obesity   . OSA (obstructive sleep apnea)     CPAP-17.5 cm water pressure  . Kidney stones     stent, lithotripsy  . Lymphadenopathy   . Diabetes mellitus     controlled  . Testosterone deficiency   . Excess or deficiency of vitamin D   . ED (erectile dysfunction)   . Gout     Past Surgical History  Procedure Date  . Gastric bypass 2005    600 lbs prior to surgery  . Cholecystectomy   . Tonsillectomy   . Finger surgery     ulnar digital nerve and artery right index finger  . Cystoscopy     retrograde and double J catheter insertion.    History   Social History  . Marital Status: Married    Spouse Name: N/A    Number of Children: N/A  . Years of Education: N/A    Occupational History  . Not on file.   Social History Main Topics  . Smoking status: Never Smoker   . Smokeless tobacco: Not on file  . Alcohol Use: No  . Drug Use: No  . Sexually Active: Not Currently     Comment: on full disability, separated, no regular exercise,walks some, drinks pot of coffee a day.   Other Topics Concern  . Not on file   Social History Narrative   Drinks one pot of coffee daily.Regular exercise-no, walks some    Family History  Problem Relation Age of Onset  . Cancer Mother     lung, heavy smoker  . Hypertension Mother   . Hyperlipidemia Mother   . Cancer Father     melanoma  . Aneurysm Father     cardiac  . Hyperlipidemia Father   . Hypertension Father   . Hyperlipidemia Sister   . Hypertension Sister   . Hyperlipidemia Brother   . Hypertension Brother   . Stroke Other     Outpatient Encounter Prescriptions as of 04/14/2012  Medication Sig Dispense Refill  . allopurinol (ZYLOPRIM) 100 MG tablet Take  1 tablet (100 mg total) by mouth daily.  30 tablet  11  . AMBULATORY NON FORMULARY MEDICATION Cyclobenzaprine 2% gabapentin 3% Baclofen 2%  Diclofenac 4% Qty# 120 ml Sig: apply small amount topically to effected area 2-3 times a day as directed 2 refills      . b complex vitamins tablet Take 1 tablet by mouth daily.        . Blood Glucose Monitoring Suppl (BLOOD GLUCOSE METER) kit by Other route. Use as instructed and test BS daily.       Marland Kitchen diltiazem (TIAZAC) 120 MG 24 hr capsule Take 1 capsule (120 mg total) by mouth daily.  90 capsule  3  . escitalopram (LEXAPRO) 20 MG tablet Take 1 tablet (20 mg total) by mouth daily. Generic please.  30 tablet  1  . HYDROcodone-acetaminophen (NORCO) 10-325 MG per tablet Take 1 tablet by mouth every 8 (eight) hours as needed for pain.  90 tablet  0  . lisinopril (PRINIVIL,ZESTRIL) 10 MG tablet Take 1 tablet (10 mg total) by mouth daily.  90 tablet  3  . LORazepam (ATIVAN) 1 MG tablet TAKE ONE TABLET BY MOUTH  EVERY 8 HOURS  90 tablet  0  . metFORMIN (GLUCOPHAGE) 1000 MG tablet TAKE ONE TABLET BY MOUTH TWICE DAILY WITH  A  MEAL  180 tablet  1  . Testosterone 10 MG/ACT (2%) GEL Place 1 mL onto the skin daily. Testosterone 10%.  Apply 1 ml to skin daily.  Recheck labs in 12 weeks.  60 g  3  . warfarin (COUMADIN) 5 MG tablet Take 1-2 tablets (5-10 mg total) by mouth daily.  60 tablet  3  . zolpidem (AMBIEN) 5 MG tablet TAKE ONE TABLET BY MOUTH AT BEDTIME AS NEEDED FOR  SLEEP  30 tablet  0          Objective:   Physical Exam  Constitutional: He is oriented to person, place, and time. He appears well-developed and well-nourished.  HENT:  Head: Normocephalic and atraumatic.       Large scab in the right nostril along the septum.  Cardiovascular: Normal rate, regular rhythm and normal heart sounds.   Pulmonary/Chest: Effort normal and breath sounds normal.  Neurological: He is alert and oriented to person, place, and time.  Skin: Skin is warm and dry.  Psychiatric: He has a normal mood and affect. His behavior is normal.          Assessment & Plan:  DM- Well controlled. Continue current regimen.  Encouraged more regular exercise.  Says get lightheaded when his HR goes up. F/U in 3 months.  Foot exam today.   A-fib - INR is 2.0. See anticoag flowsheet. He is interested in non coumadin options but doesn't have drug coverage right now. Says get lightheaded when his HR goes up. Encouraged him to discuss with Dr. Jens Som before intense exercise.   HTN- Well controlled. F/u in 6 months.   Maj. depressive disorder-his psychiatrist asked me to do an EKG because of his current medication regimen to monitor for QT prolongation. His EKG shows a rate of 68 beats per minute, normal sinus rhythm, slight axis deviation. Inverted T wave in lead 3. Poor progression of the lateral leads. No QT prolongation.  Chronic irritation of the nasal mucosa-recommend using Neosporin applied at bedtime daily. Also  recommend running a warm mist or cool mist humidifier in his bedroom overnight while sleeping to help humidified air. He can also apply a moisturizing nasal  saline gel during the day. I will also refer him to ENT for further evaluation and treatment.  Morbid obesity-continue to work on regular exercise and diet and weight loss.

## 2012-04-14 NOTE — Progress Notes (Signed)
   THERAPIST PROGRESS NOTE  Session Time: 2:00  Participation Level: Active  Behavioral Response: CasualAlertDepressed  Type of Therapy: Individual Therapy  Treatment Goals addressed: Coping  Interventions: CBT  Summary: Rodney Bright is a 52 y.o. male who presents with depression.   Suicidal/Homicidal: Nowithout intent/plan  Therapist Response: It had been one month since I saw the client do to a rescheduling issue on his part. He indicated that for the most part everything was okay. He starts school again on January 27 where he will take 12 hours of classes at [employer]. He indicates that he has the major stresses at work. He did indicate that the holidays were difficult as they typically are for him. He indicated that both of his parents died in May 04, 2022. He indicated that he attempted to get together with his ex-wife just for a brief period of time. He said he is not sure why is he does not see her as often but instead of her saying no that considerable feelings she simply made excuses for not being able to get together with him. He indicated that initially made him angry but that he started to look at it as it may be assigned that they need to go ahead and proceed with closing the door on the marriage. He indicates that he still cannot take that step because he is not financially able. He thinks that his wife feels he will get upset with that he would be hard to get along with the divorce. Indicates that it is just the opposite and that he would not fight his wife they'll if he were to stay for the divorce as he has told her before. He indicates that his depression is at a mild to moderate level. He indicates some anxiety in returning to school as he is taking accounting for one of his classes that he has not had any accounting in 20 years. He does feel he can handle it and says that if he takes 12 hours or so but the spring summer and fall he could graduate in the fall which he has  been waiting 20 years to do. We reviewed his coping skills for anxiety. He does contract for safety he has no thoughts of hurting himself or anyone else. Plan: Return again in 3 weeks.  Diagnosis: Axis I: 296.32    Axis II: Deferred    French Ana, [facility] 04/14/2012

## 2012-04-16 ENCOUNTER — Encounter: Payer: Self-pay | Admitting: *Deleted

## 2012-04-23 ENCOUNTER — Telehealth: Payer: Self-pay | Admitting: *Deleted

## 2012-04-23 NOTE — Telephone Encounter (Signed)
That is fine. We can split it up.

## 2012-04-23 NOTE — Telephone Encounter (Signed)
Pt calls and will get insurance on 05/09/2012. Wants to know if he can get refill on the hydrocodone which is due on the 20th but ask for it to be just enough quantity to last him the last 10 days of the month. That way at beginning of month  He can get another refill and insurance cover it

## 2012-04-24 ENCOUNTER — Other Ambulatory Visit: Payer: Self-pay | Admitting: *Deleted

## 2012-04-24 MED ORDER — ZOLPIDEM TARTRATE 5 MG PO TABS: 5.0000 mg | ORAL_TABLET | Freq: Every evening | ORAL | Status: DC | PRN

## 2012-04-24 MED ORDER — HYDROCODONE-ACETAMINOPHEN 10-325 MG PO TABS: 1.0000 | ORAL_TABLET | Freq: Three times a day (TID) | ORAL | Status: DC | PRN

## 2012-04-28 ENCOUNTER — Ambulatory Visit (HOSPITAL_COMMUNITY): Payer: Self-pay | Admitting: Behavioral Health

## 2012-05-01 ENCOUNTER — Ambulatory Visit (HOSPITAL_COMMUNITY): Payer: Self-pay | Admitting: Psychiatry

## 2012-05-08 ENCOUNTER — Other Ambulatory Visit: Payer: Self-pay | Admitting: *Deleted

## 2012-05-08 MED ORDER — HYDROCODONE-ACETAMINOPHEN 10-325 MG PO TABS: 1.0000 | ORAL_TABLET | Freq: Three times a day (TID) | ORAL | Status: DC | PRN

## 2012-05-11 ENCOUNTER — Ambulatory Visit: Payer: Self-pay | Admitting: Cardiology

## 2012-05-14 ENCOUNTER — Ambulatory Visit (HOSPITAL_COMMUNITY): Payer: Self-pay | Admitting: Behavioral Health

## 2012-05-15 ENCOUNTER — Encounter (HOSPITAL_COMMUNITY): Payer: Self-pay | Admitting: Psychiatry

## 2012-05-15 ENCOUNTER — Ambulatory Visit (INDEPENDENT_AMBULATORY_CARE_PROVIDER_SITE_OTHER): Payer: BC Managed Care – PPO | Admitting: Psychiatry

## 2012-05-15 VITALS — BP 129/74 | HR 68 | Ht 68.25 in | Wt >= 6400 oz

## 2012-05-15 DIAGNOSIS — F332 Major depressive disorder, recurrent severe without psychotic features: Secondary | ICD-10-CM

## 2012-05-15 DIAGNOSIS — F329 Major depressive disorder, single episode, unspecified: Secondary | ICD-10-CM

## 2012-05-15 DIAGNOSIS — F1921 Other psychoactive substance dependence, in remission: Secondary | ICD-10-CM

## 2012-05-15 MED ORDER — ESCITALOPRAM OXALATE 20 MG PO TABS: 20.0000 mg | ORAL_TABLET | Freq: Two times a day (BID) | ORAL | Status: DC
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
## 2012-05-20 ENCOUNTER — Encounter: Payer: Self-pay | Admitting: Cardiology

## 2012-05-20 NOTE — Progress Notes (Signed)
HPI: Pleasant male for fu of atrial fibrillation. Previously presented to hospital in [place] with dyspnea and found to be in atrial fibrillation with a rapid ventricular response. He converted to sinus rhythm. Based on outside records his LV function was normal on echocardiogram. Cardiac markers were negative. He has been treated with Cardizem and Coumadin. Stress echocardiogram in March of 2012 was normal. TSH also normal. Patient last seen in February of 2012. Since that time,   Current Outpatient Prescriptions  Medication Sig Dispense Refill  . allopurinol (ZYLOPRIM) 100 MG tablet Take 1 tablet (100 mg total) by mouth daily.  30 tablet  11  . AMBULATORY NON FORMULARY MEDICATION Cyclobenzaprine 2% gabapentin 3% Baclofen 2%  Diclofenac 4% Qty# 120 ml Sig: apply small amount topically to effected area 2-3 times a day as directed 2 refills      . b complex vitamins tablet Take 1 tablet by mouth daily.        . Blood Glucose Monitoring Suppl (BLOOD GLUCOSE METER) kit by Other route. Use as instructed and test BS daily.       Marland Kitchen diltiazem (TIAZAC) 120 MG 24 hr capsule Take 1 capsule (120 mg total) by mouth daily.  90 capsule  3  . escitalopram (LEXAPRO) 20 MG tablet Take 1 tablet (20 mg total) by mouth 2 (two) times daily. Generic please.  60 tablet  1  . HYDROcodone-acetaminophen (NORCO) 10-325 MG per tablet Take 1 tablet by mouth every 8 (eight) hours as needed for pain.  90 tablet  0  . lisinopril (PRINIVIL,ZESTRIL) 10 MG tablet Take 1 tablet (10 mg total) by mouth daily.  90 tablet  3  . LORazepam (ATIVAN) 1 MG tablet TAKE ONE TABLET BY MOUTH EVERY 8 HOURS  90 tablet  0  . metFORMIN (GLUCOPHAGE) 1000 MG tablet TAKE ONE TABLET BY MOUTH TWICE DAILY WITH  A  MEAL  180 tablet  1  . Testosterone 10 MG/ACT (2%) GEL Place 1 mL onto the skin daily. Testosterone 10%.  Apply 1 ml to skin daily.  Recheck labs in 12 weeks.  60 g  3  . warfarin (COUMADIN) 5 MG tablet Take 1-2 tablets (5-10 mg total)  by mouth daily.  60 tablet  3  . zolpidem (AMBIEN) 5 MG tablet Take 1 tablet (5 mg total) by mouth at bedtime as needed for sleep.  15 tablet  0   No current facility-administered medications for this visit.     Past Medical History  Diagnosis Date  . Atrial fibrillation   . Lymphadenopathy   . Testosterone deficiency   . Anemia   . Vitamin D deficiency   . ED (erectile dysfunction)   . Morbid obesity   . OSA (obstructive sleep apnea)     CPAP-17.5 cm water pressure  . Kidney stones     stent, lithotripsy  . Lymphadenopathy   . Diabetes mellitus     controlled  . Testosterone deficiency   . Excess or deficiency of vitamin D   . ED (erectile dysfunction)   . Gout     Past Surgical History  Procedure Laterality Date  . Gastric bypass  2005    600 lbs prior to surgery  . Cholecystectomy    . Tonsillectomy    . Finger surgery      ulnar digital nerve and artery right index finger  . Cystoscopy      retrograde and double J catheter insertion.    History   Social History  .  Marital Status: Married    Spouse Name: N/A    Number of Children: N/A  . Years of Education: N/A   Occupational History  . Not on file.   Social History Main Topics  . Smoking status: Never Smoker   . Smokeless tobacco: Not on file  . Alcohol Use: No  . Drug Use: No  . Sexually Active: Yes -- Male partner(s)    Birth Control/ Protection: None     Comment: on full disability, separated, no regular exercise,walks some, drinks pot of coffee a day.   Other Topics Concern  . Not on file   Social History Narrative   Drinks one pot of coffee daily.   Regular exercise-no, walks some    ROS: no fevers or chills, productive cough, hemoptysis, dysphasia, odynophagia, melena, hematochezia, dysuria, hematuria, rash, seizure activity, orthopnea, PND, pedal edema, claudication. Remaining systems are negative.  Physical Exam: Well-developed well-nourished in no acute distress.  Skin is warm  and dry.  HEENT is normal.  Neck is supple.  Chest is clear to auscultation with normal expansion.  Cardiovascular exam is regular rate and rhythm.  Abdominal exam nontender or distended. No masses palpated. Extremities show no edema. neuro grossly intact  ECG 04/14/2012-sinus rhythm with nonspecific ST changes.     This encounter was created in error - please disregard.

## 2012-05-21 ENCOUNTER — Ambulatory Visit (HOSPITAL_COMMUNITY): Payer: Self-pay | Admitting: Behavioral Health

## 2012-05-28 ENCOUNTER — Other Ambulatory Visit: Payer: Self-pay | Admitting: *Deleted

## 2012-05-28 MED ORDER — LORAZEPAM 1 MG PO TABS: ORAL_TABLET | ORAL | Status: DC

## 2012-06-08 ENCOUNTER — Other Ambulatory Visit: Payer: Self-pay | Admitting: *Deleted

## 2012-06-08 MED ORDER — HYDROCODONE-ACETAMINOPHEN 10-325 MG PO TABS: 1.0000 | ORAL_TABLET | Freq: Three times a day (TID) | ORAL | Status: DC | PRN

## 2012-06-09 ENCOUNTER — Other Ambulatory Visit: Payer: Self-pay | Admitting: *Deleted

## 2012-06-09 MED ORDER — ZOLPIDEM TARTRATE 5 MG PO TABS: 5.0000 mg | ORAL_TABLET | Freq: Every evening | ORAL | Status: DC | PRN

## 2012-06-10 ENCOUNTER — Telehealth (HOSPITAL_COMMUNITY): Payer: Self-pay

## 2012-06-10 ENCOUNTER — Telehealth: Payer: Self-pay | Admitting: Family Medicine

## 2012-06-10 ENCOUNTER — Ambulatory Visit (INDEPENDENT_AMBULATORY_CARE_PROVIDER_SITE_OTHER): Payer: BC Managed Care – PPO | Admitting: Cardiology

## 2012-06-10 ENCOUNTER — Encounter: Payer: Self-pay | Admitting: Cardiology

## 2012-06-10 VITALS — BP 126/80 | HR 78 | Ht 68.0 in | Wt 397.0 lb

## 2012-06-10 DIAGNOSIS — I10 Essential (primary) hypertension: Secondary | ICD-10-CM

## 2012-06-10 DIAGNOSIS — F329 Major depressive disorder, single episode, unspecified: Secondary | ICD-10-CM

## 2012-06-10 DIAGNOSIS — I4891 Unspecified atrial fibrillation: Secondary | ICD-10-CM

## 2012-06-10 MED ORDER — ESCITALOPRAM OXALATE 20 MG PO TABS: 20.0000 mg | ORAL_TABLET | Freq: Every day | ORAL | Status: DC

## 2012-06-10 MED ORDER — RIVAROXABAN 15 MG PO TABS: 15.0000 mg | ORAL_TABLET | Freq: Every day | ORAL | Status: DC

## 2012-06-10 NOTE — Patient Instructions (Addendum)
Your physician recommends that you schedule a follow-up appointment in: 3 MONTHS WITH DR Jens Som [place]  Your physician has requested that you have an echocardiogram. Echocardiography is a painless test that uses sound waves to create images of your heart. It provides your doctor with information about the size and shape of your heart and how well your heart's chambers and valves are working. This procedure takes approximately one hour. There are no restrictions for this procedure.   Your physician has recommended that you wear an event monitor. Event monitors are medical devices that record the heart's electrical activity. Doctors most often [place] these monitors to diagnose arrhythmias. Arrhythmias are problems with the speed or rhythm of the heartbeat. The monitor is a small, portable device. You can wear one while you do your normal daily activities. This is usually used to diagnose what is causing palpitations/syncope (passing out).   Your physician recommends that you return for lab work in: TODAY AND 4 WEEKS   STOP WARFARIN  AFTER BEING OFF WARFARIN FOR 5 DAYS START XARELTO 15 MG ONCE DAILY WITH FOOD

## 2012-06-10 NOTE — Progress Notes (Signed)
HPI: Pleasant male for fu of atrial fibrillation. Previously presented to [facility] with atrial fibrillation. Based on outside records his LV function was normal on echocardiogram. Stress echocardiogram in March of 2012 showed no ischemia. TSH normal. I last saw him in March of 2012. Since then, he denies dyspnea on exertion, orthopnea, PND, pedal edema, syncope or chest pain. However in the past 2 months he is having increasing frequency of atrial fibrillation. He feels palpitations associated with dizziness and dyspnea. This can last all day. He states he has had 18-19 bouts in the past several months.   Current Outpatient Prescriptions  Medication Sig Dispense Refill  . allopurinol (ZYLOPRIM) 100 MG tablet Take 1 tablet (100 mg total) by mouth daily.  30 tablet  11  . AMBULATORY NON FORMULARY MEDICATION Cyclobenzaprine 2% gabapentin 3% Baclofen 2%  Diclofenac 4% Qty# 120 ml Sig: apply small amount topically to effected area 2-3 times a day as directed 2 refills      . b complex vitamins tablet Take 1 tablet by mouth daily.        . Blood Glucose Monitoring Suppl (BLOOD GLUCOSE METER) kit by Other route. Use as instructed and test BS daily.       Marland Kitchen diltiazem (TIAZAC) 120 MG 24 hr capsule Take 1 capsule (120 mg total) by mouth daily.  90 capsule  3  . escitalopram (LEXAPRO) 20 MG tablet Take 1 tablet (20 mg total) by mouth 2 (two) times daily. Generic please.  60 tablet  1  . HYDROcodone-acetaminophen (NORCO) 10-325 MG per tablet Take 1 tablet by mouth every 8 (eight) hours as needed for pain.  90 tablet  0  . lisinopril (PRINIVIL,ZESTRIL) 10 MG tablet Take 1 tablet (10 mg total) by mouth daily.  90 tablet  3  . LORazepam (ATIVAN) 1 MG tablet TAKE ONE TABLET BY MOUTH EVERY 8 HOURS  90 tablet  0  . metFORMIN (GLUCOPHAGE) 1000 MG tablet       . NON FORMULARY Joint Juice 1 daily      . warfarin (COUMADIN) 5 MG tablet Take 1-2 tablets (5-10 mg total) by mouth daily.  60 tablet  3  .  zolpidem (AMBIEN) 5 MG tablet Take 1 tablet (5 mg total) by mouth at bedtime as needed for sleep.  30 tablet  0   No current facility-administered medications for this visit.     Past Medical History  Diagnosis Date  . Atrial fibrillation   . Lymphadenopathy   . Testosterone deficiency   . Anemia   . Vitamin D deficiency   . ED (erectile dysfunction)   . Morbid obesity   . OSA (obstructive sleep apnea)     CPAP-17.5 cm water pressure  . Kidney stones     stent, lithotripsy  . Lymphadenopathy   . Diabetes mellitus     controlled  . Testosterone deficiency   . Excess or deficiency of vitamin D   . ED (erectile dysfunction)   . Gout     Past Surgical History  Procedure Laterality Date  . Gastric bypass  2005    600 lbs prior to surgery  . Cholecystectomy    . Tonsillectomy    . Finger surgery      ulnar digital nerve and artery right index finger  . Cystoscopy      retrograde and double J catheter insertion.    History   Social History  . Marital Status: Married    Spouse Name: N/A  Number of Children: N/A  . Years of Education: N/A   Occupational History  . Not on file.   Social History Main Topics  . Smoking status: Never Smoker   . Smokeless tobacco: Not on file  . Alcohol Use: No  . Drug Use: No  . Sexually Active: Yes -- Male partner(s)    Birth Control/ Protection: None     Comment: on full disability, separated, no regular exercise,walks some, drinks pot of coffee a day.   Other Topics Concern  . Not on file   Social History Narrative   Drinks one pot of coffee daily.   Regular exercise-no, walks some    ROS: no fevers or chills, productive cough, hemoptysis, dysphasia, odynophagia, melena, hematochezia, dysuria, hematuria, rash, seizure activity, orthopnea, PND, pedal edema, claudication. Remaining systems are negative.  Physical Exam: Well-developed obese in no acute distress.  Skin is warm and dry.  HEENT is normal.  Neck is supple.   Chest is clear to auscultation with normal expansion.  Cardiovascular exam is regular rate and rhythm.  Abdominal exam nontender or distended. No masses palpated. Extremities show no edema. neuro grossly intact  ECG 04/14/12-sinus, low voltage, PRWP

## 2012-06-10 NOTE — Telephone Encounter (Signed)
Called patient, he has seen his cardiologist. There is still a question of arrhythmia with the patient. He has not filled his last prescription on lexapro.  PLAN: Escribed escitalopram at 20 mg, confirmed with pharmacy that it had been received. Will not go up on this medication.  He is going to reschedule his next appointment.

## 2012-06-10 NOTE — Telephone Encounter (Signed)
Patient saw Dr. Jens Som today and he took him off the coumadin and is going to start him on Xarelto in the next 5 days.  He also wanted to let you know that he has seen Dr. Cathey Endow.  He wanted you to know these two things.  thanks

## 2012-06-10 NOTE — Assessment & Plan Note (Signed)
Patient is having increasing episodes of what he feels his atrial fibrillation. Plan repeat echocardiogram. Check TSH. Place [facility]. If indeed he is having recurrent episodes of atrial fibrillation he will require an antiarrhythmic. We will most likely add flecainide and he would need an exercise treadmill one week after initiating to exclude exercise-induced arrhythmia. He is also interested in one of the new anticoagulants. Renal function normal previously. Discontinue Coumadin. In 5 days begin xeralto 20 mg daily; check CBC and BMET in 4 weeks.

## 2012-06-10 NOTE — Assessment & Plan Note (Signed)
Continue present medications. 

## 2012-06-10 NOTE — Assessment & Plan Note (Signed)
We discussed weight loss. 

## 2012-06-11 ENCOUNTER — Ambulatory Visit: Payer: Self-pay

## 2012-06-11 LAB — BASIC METABOLIC PANEL WITH GFR
BUN: 24 mg/dL — ABNORMAL HIGH (ref 6–23)
CO2: 22 mEq/L (ref 19–32)
Calcium: 9.5 mg/dL (ref 8.4–10.5)
Chloride: 109 mEq/L (ref 96–112)
Creat: 1.28 mg/dL (ref 0.50–1.35)
GFR, Est African American: 73 mL/min
GFR, Est Non African American: 63 mL/min
Glucose, Bld: 168 mg/dL — ABNORMAL HIGH (ref 70–99)
Potassium: 4.4 mEq/L (ref 3.5–5.3)
Sodium: 140 mEq/L (ref 135–145)

## 2012-06-11 LAB — CBC
HCT: 42.4 % (ref 39.0–52.0)
Hemoglobin: 14.1 g/dL (ref 13.0–17.0)
MCH: 28.3 pg (ref 26.0–34.0)
MCHC: 33.3 g/dL (ref 30.0–36.0)
MCV: 85 fL (ref 78.0–100.0)
Platelets: 232 10*3/uL (ref 150–400)
RBC: 4.99 MIL/uL (ref 4.22–5.81)
RDW: 15.2 % (ref 11.5–15.5)
WBC: 10.5 10*3/uL (ref 4.0–10.5)

## 2012-06-11 LAB — TSH: TSH: 1.794 u[IU]/mL (ref 0.350–4.500)

## 2012-06-11 NOTE — Progress Notes (Signed)
Enrolled patient for a 30 day event monitor

## 2012-06-12 ENCOUNTER — Ambulatory Visit (HOSPITAL_COMMUNITY): Payer: Self-pay | Admitting: Psychiatry

## 2012-06-15 ENCOUNTER — Telehealth: Payer: Self-pay | Admitting: Cardiology

## 2012-06-15 NOTE — Telephone Encounter (Signed)
New Problem:    Patient called in returning a call about his recent lab work and that he needs new discount cards for his medications because the ones he had received were out of date.  Please call back.

## 2012-06-15 NOTE — Telephone Encounter (Signed)
Spoke with pt, he is aware of lab results. He is aware the xarelto card will work if he calls and activates it prior to taking to the pharm, the company will give him another code.

## 2012-06-16 ENCOUNTER — Encounter (HOSPITAL_COMMUNITY): Payer: Self-pay | Admitting: Behavioral Health

## 2012-06-16 ENCOUNTER — Ambulatory Visit (INDEPENDENT_AMBULATORY_CARE_PROVIDER_SITE_OTHER): Payer: BC Managed Care – PPO | Admitting: Behavioral Health

## 2012-06-16 DIAGNOSIS — F411 Generalized anxiety disorder: Secondary | ICD-10-CM

## 2012-06-16 NOTE — Progress Notes (Signed)
THERAPIST PROGRESS NOTE  Session Time: 11:00  Participation Level: Active  Behavioral Response: CasualAlertDepressed  Type of Therapy: Individual Therapy  Treatment Goals addressed: Coping  Interventions: CBT  Summary: Rodney Bright is a 52 y.o. male who presents with anxiety and depression.   Suicidal/Homicidal: Nowithout intent/plan  Therapist Response: I have not seen decline in 5 or 6 weeks do to him having to reschedule an appointment. He indicated that he has become increasingly anxious and depressed. He states that he is not suicidal but at times he feels" useless." He stated that he did start back to school but soon after starting the semester started having AFib heart palpitations. He indicates that he is waiting results from the blood work to see what may have been causing them. He indicates that they ruled out a heart attack he has had this condition before and it caused him to miss several days of school saying he could just not feel well enough to leave the apartment. Indicated that between health issues and weather issues he has missed a significant amount of the semester already and feels overwhelmed. He did state that he is speaking to the dean tomorrow to see if he can get an extended amount of time to the semester to be able to complete 2 of his classes. He indicates that one professor has been very understanding in getting him more time and he feels he can stay at a good pace and manner. He indicates that he allow that to be a building block for increasing depression and negative self-esteem saying that he has begun to question what it is he is going to do with his life. He is currently 6 classes away from his undergraduate degree and wants to attend [employer] and get a counseling degree. He also indicated that his health issues abating think more about his weight issues. He did meet with Dr. Cathey Endow. She indicated that she could not perform another gastric bypass  surgery but that she confirmed is someone that would. She did introduce a new medication as to the clients and different nutritional aspects which she is willing to try first. He said that after his first gastric bypass surgery he works from 600 pounds eventually down to 300 pounds. He is currently in about 400 pounds but knows that he can function at around 250 pounds that he has a second surgery. He indicates that his weight contributes negatively to his self-esteem because he knows that he is putting himself at a significant health risk although his heart a strong. He said he also knows that people look at him and say things about his weight. Talked about breaking his leg down into manageable cyclic choosing a high chart as an example. We talked about his life being comprised of emotional, spiritual, physical, financial, vocational, social pieces of that high and being introspective to different parts of how and where he is and that piece of time in his life. He is certainly having difficulty educationally and socially. We talked about writing down the school to the weekend time [employer] month at a time to the point where he will be through with the semester short amount of time and only have 3 more classes for his undergraduate degree. We talked about not looking too much at the big picture potentially saying that if he can have a counseling degree about 3 years he can practice in that field for 20 or more years at his age. The client balance fact  intellectually he knows that he needs to change his way of thinking but emotionally he has not connected to the fact that he believes that he can do that. We talked about coping skills for anxiety depression as well as practice CBT thought replacement to reduce anxiety and increase positive thinking. The client does say that he is not suicidal but does feel useless but did commit to practicing to start changing his thought process.   Plan:  Return again in 3  weeks.  Diagnosis: Axis I: 296.32/300.02    Axis II: Deferred    French Ana, [facility] 06/16/2012

## 2012-06-22 ENCOUNTER — Ambulatory Visit (HOSPITAL_COMMUNITY): Payer: BC Managed Care – PPO | Attending: Cardiology | Admitting: Radiology

## 2012-06-22 DIAGNOSIS — I059 Rheumatic mitral valve disease, unspecified: Secondary | ICD-10-CM | POA: Insufficient documentation

## 2012-06-22 DIAGNOSIS — I10 Essential (primary) hypertension: Secondary | ICD-10-CM | POA: Insufficient documentation

## 2012-06-22 DIAGNOSIS — I4891 Unspecified atrial fibrillation: Secondary | ICD-10-CM | POA: Insufficient documentation

## 2012-06-22 DIAGNOSIS — E119 Type 2 diabetes mellitus without complications: Secondary | ICD-10-CM | POA: Insufficient documentation

## 2012-06-22 NOTE — Progress Notes (Signed)
Echocardiogram performed.  

## 2012-06-25 ENCOUNTER — Other Ambulatory Visit: Payer: Self-pay | Admitting: Family Medicine

## 2012-06-26 ENCOUNTER — Other Ambulatory Visit: Payer: Self-pay

## 2012-06-26 ENCOUNTER — Telehealth: Payer: Self-pay

## 2012-06-26 DIAGNOSIS — I4891 Unspecified atrial fibrillation: Secondary | ICD-10-CM

## 2012-06-26 NOTE — Telephone Encounter (Signed)
Rodney Bright states he is unsure how to set up and put on his cardionet monitor.  He is requesting some help with this.  He also states that it requires a land-line which he does not have.  He is requesting a phone call regarding these matters.

## 2012-06-29 ENCOUNTER — Telehealth: Payer: Self-pay

## 2012-06-29 NOTE — Telephone Encounter (Signed)
Message copied by Zola Button on Mon Jun 29, 2012  4:56 PM ------      Message from: Lewayne Bunting      Created: Mon Jun 29, 2012  3:17 PM      Regarding: RE: lexiscan       No other good options      Olga Millers            ----- Message -----         From: Zola Button, LPN         Sent: 06/29/2012   2:42 PM           To: Deliah Goody, RN, Lewayne Bunting, MD      Subject: Annell Greening: lexiscan                                                         ----- Message -----         From: Connye Burkitt         Sent: 06/29/2012  12:12 PM           To: Zola Button, LPN      Subject: FW: lexiscan                                               Patient doesn't want to go to [place] for stress test. Patient is over the weight limit for our office as well as [facility]. He wants to know if Dr. Jens Som could recommend another test.             ----- Message -----         From: Connye Burkitt         Sent: 06/26/2012  12:31 PM           To: Merita Norton [facility]ate      Subject: FW: lexiscan                                                         ----- Message -----         From: Zola Button, LPN         Sent: 06/26/2012  10:25 AM           To: Donne Hazel [facility]      Subject: lexiscan                                                 Pt needs a lexiscan myoview scheduled.  He is aware that someone will be calling him to scheduled this.   Thanks.  Debby             ------

## 2012-06-29 NOTE — Telephone Encounter (Signed)
Pt was notified of this by the [facility] dept.

## 2012-06-30 ENCOUNTER — Other Ambulatory Visit: Payer: Self-pay | Admitting: Family Medicine

## 2012-06-30 NOTE — Telephone Encounter (Signed)
Patient to have have labs checked before additional refills beyond today.

## 2012-07-01 ENCOUNTER — Institutional Professional Consult (permissible substitution): Payer: Self-pay | Admitting: Cardiology

## 2012-07-07 ENCOUNTER — Telehealth: Payer: Self-pay

## 2012-07-07 NOTE — Telephone Encounter (Signed)
See chart

## 2012-07-09 ENCOUNTER — Other Ambulatory Visit: Payer: Self-pay | Admitting: *Deleted

## 2012-07-09 MED ORDER — HYDROCODONE-ACETAMINOPHEN 10-325 MG PO TABS: 1.0000 | ORAL_TABLET | Freq: Three times a day (TID) | ORAL | Status: DC | PRN

## 2012-07-10 ENCOUNTER — Ambulatory Visit (INDEPENDENT_AMBULATORY_CARE_PROVIDER_SITE_OTHER): Payer: BC Managed Care – PPO | Admitting: Behavioral Health

## 2012-07-10 ENCOUNTER — Encounter (HOSPITAL_COMMUNITY): Payer: Self-pay | Admitting: Behavioral Health

## 2012-07-10 DIAGNOSIS — F331 Major depressive disorder, recurrent, moderate: Secondary | ICD-10-CM

## 2012-07-10 NOTE — Progress Notes (Signed)
   THERAPIST PROGRESS NOTE  Session Time: 11:00  Participation Level: Active  Behavioral Response: CasualAlertDepressed  Type of Therapy: Individual Therapy  Treatment Goals addressed: Coping  Interventions: CBT  Summary: CHIN WACHTER is a 52 y.o. male who presents with depression.   Suicidal/Homicidal: Nowithout intent/plan  Therapist Response: The client indicated that he and his wife had made fair divorce final since her last session. He said in previous sessions that he was post that [place] with the best thing but that he could not afford an attorney. His wife pay for the attorney and he wouldn't sign the papers. He indicated that the divorce did not affect him negatively finding out that his ex-wife was now living with his sister who used to be one of his best friends unsettled him. He indicated that had been an issue that they separated but he felt that he ended at least on his wife's part. He stated that was a conversation after their separation about client not being comfortable with his sister feeling like she favorite his ex-wife is that of him when he had such a strong relationship and he said finding out that his ex-wife is now living with his sister in his sister's family house made him very angry. We talked at length about his anger been directed at his sister and not his ex-wife. We talked about why he was holding onto this. We talked about the history of how his family he got even instead of her days we talked about forgiveness and what that means. We also talked about the damage that holding onto his anger is going to him. The client close to session by saying he is questioning whether he knows how to love him anymore or not. As time was ending I ask him to hold onto the thought than to be thinking about public means to him so we could process that in the next session.  Plan: Return again in 4 weeks.  Diagnosis: Axis I: 296.32    Axis II: Deferred    French Ana,  [facility] 07/10/2012

## 2012-07-15 ENCOUNTER — Other Ambulatory Visit: Payer: Self-pay | Admitting: Physician Assistant

## 2012-07-15 ENCOUNTER — Telehealth: Payer: Self-pay | Admitting: Family Medicine

## 2012-07-15 MED ORDER — ZOLPIDEM TARTRATE 5 MG PO TABS: 5.0000 mg | ORAL_TABLET | Freq: Every evening | ORAL | Status: DC | PRN

## 2012-07-15 NOTE — Telephone Encounter (Signed)
He needs an appointment. The labwork that is required would not be drawn by the cardiologist. If he wants to go back on the medication then he is going to have to be seen to discuss this with the provider and have the labwork done per the provider when he comes in for the appointment.

## 2012-07-15 NOTE — Telephone Encounter (Signed)
Patient walked in advised that his prescription was denied. Per [place] states pt needs an appointment w/ Dr. And lab work before med can be refilled. Patient has appt 07/17/12 w/ Lesly Rubenstein but states he just recently had lab work thru Dr. Jens Som on March 5th 2014 and req to know if that lab work will be sufficient enough. Patient states he uses the compound lab in [place]. Pt states since he had lab work already and in case he doesn't have to have an appointment will a nurse call him. Thanks

## 2012-07-17 ENCOUNTER — Ambulatory Visit (INDEPENDENT_AMBULATORY_CARE_PROVIDER_SITE_OTHER): Payer: BC Managed Care – PPO | Admitting: Physician Assistant

## 2012-07-17 ENCOUNTER — Telehealth: Payer: Self-pay | Admitting: Cardiology

## 2012-07-17 ENCOUNTER — Encounter: Payer: Self-pay | Admitting: Physician Assistant

## 2012-07-17 VITALS — BP 125/77 | HR 67 | Ht 68.6 in | Wt 399.0 lb

## 2012-07-17 DIAGNOSIS — Z79899 Other long term (current) drug therapy: Secondary | ICD-10-CM

## 2012-07-17 DIAGNOSIS — E119 Type 2 diabetes mellitus without complications: Secondary | ICD-10-CM

## 2012-07-17 DIAGNOSIS — E291 Testicular hypofunction: Secondary | ICD-10-CM

## 2012-07-17 LAB — POCT UA - MICROALBUMIN
Albumin/Creatinine Ratio, Urine, POC: <30
Creatinine, POC: 200 mg/dL
Microalbumin Ur, POC: 30 mg/dL

## 2012-07-17 LAB — POCT GLYCOSYLATED HEMOGLOBIN (HGB A1C): Hemoglobin A1C: 6.3

## 2012-07-17 MED ORDER — TESTOSTERONE 10 MG/ACT (2%) TD GEL: 10.0000 mg | Freq: Every day | TRANSDERMAL | Status: DC

## 2012-07-17 NOTE — Telephone Encounter (Signed)
New Problem:    Patient called in wanting to know the results form his latest test.  Please call back.

## 2012-07-17 NOTE — Progress Notes (Signed)
  Subjective:    Patient ID: Rodney Bright, male    DOB: [birth year], 52 y.o.   MRN: [id number]  HPI Patient presents to the clinic to follow up on DM and to restart testosterone. He recently got insurance and now can afford regular follow ups and testosterone.   DM- Well controlled with diet and metformin. He had gastric bypass in the past and was able to go off all meds;however, he has gained weight back and working with Dr. Cathey Endow to lose the weight back. She did put him on belviq. He does feel like it helps. Denies any episodes of hypoglycemia.   He was previously on testosterone. This made hime feel better and reported that it did help his sex drive. Needs refill.    Review of Systems     Objective:   Physical Exam  Constitutional: He is oriented to person, place, and time. He appears well-developed and well-nourished.  Morbidly obese.  HENT:  Head: Normocephalic and atraumatic.  Neck: Normal range of motion. Neck supple.  Cardiovascular: Normal rate, regular rhythm and normal heart sounds.   Pulmonary/Chest: Effort normal and breath sounds normal. He has no wheezes.  Neurological: He is alert and oriented to person, place, and time.  Skin: Skin is warm and dry.  Psychiatric: He has a normal mood and affect. His behavior is normal.          Assessment & Plan:  DM- a1c 6.3 UA mirco done. Weight management followed by Dr. Cathey Endow. Doing well. Continue on metformin not ready to take him off medication at this point. Follow up in 3 months. Discussed importance of eye exam.  Male hypogonadism- Refilled previous dose of testosterone. Gave lab slip to have checked along with liver enzymes. Follow up in 3 months.

## 2012-07-17 NOTE — Telephone Encounter (Signed)
Left message for pt to call.

## 2012-07-22 NOTE — Telephone Encounter (Signed)
Mr. Burright  Appointment has been rescheduled for 07/27/12 at 10 am. Patient needs to arrive Heart Station located in the [employer] , 15 minutes prior to appointment time. Patient is aware.

## 2012-07-22 NOTE — Telephone Encounter (Signed)
Left message for pt to call, nuclear test from [facility] here for dr Jens Som review. Conclusions read as follows, no findings consistent with inducible ischemia. Normal left ventricular wall motion with estimated EF% >65.

## 2012-07-22 NOTE — Telephone Encounter (Signed)
Spoke with pt, aware of the stress test results. Pt was asking about the monitor. He was supposed to get the monitor while at [facility] having the stress test. Will call to get that appt rescheduled.

## 2012-07-22 NOTE — Telephone Encounter (Signed)
Follow up  ° ° ° °Returning call back to nurse  °

## 2012-07-31 ENCOUNTER — Other Ambulatory Visit: Payer: Self-pay | Admitting: *Deleted

## 2012-07-31 MED ORDER — LORAZEPAM 1 MG PO TABS: ORAL_TABLET | ORAL | Status: DC

## 2012-08-05 ENCOUNTER — Telehealth: Payer: Self-pay | Admitting: *Deleted

## 2012-08-05 NOTE — Telephone Encounter (Signed)
Discussed with dr Jens Som, will not have pt do monitor at this time. Will discuss flecainide at follow up appt. Pt voiced understanding

## 2012-08-05 NOTE — Telephone Encounter (Signed)
Spoke with pt, he has never gone to [facility] to get his monitor. He reports no further episodes of atrial fib since starting xarelto, that he is aware of. Will discuss pt issues with dr Jens Som

## 2012-08-07 ENCOUNTER — Other Ambulatory Visit: Payer: Self-pay | Admitting: Family Medicine

## 2012-08-07 ENCOUNTER — Other Ambulatory Visit: Payer: Self-pay | Admitting: *Deleted

## 2012-08-07 MED ORDER — HYDROCODONE-ACETAMINOPHEN 10-325 MG PO TABS: 1.0000 | ORAL_TABLET | Freq: Three times a day (TID) | ORAL | Status: DC | PRN

## 2012-08-07 NOTE — Telephone Encounter (Signed)
Hydrocodone refilled.  

## 2012-08-10 ENCOUNTER — Telehealth: Payer: Self-pay | Admitting: *Deleted

## 2012-08-10 NOTE — Telephone Encounter (Signed)
[facility]ed [facility] and LMOVM for pt's [employer].Rodney Bright

## 2012-08-10 NOTE — Telephone Encounter (Signed)
Pt called stating that Rx is not at pharmacy. I told him that I had to call this in and lvm for the refill. Pt stated that there was a rude woman at the pharmacy and she told him that it wasn't there. I called his pharmacy back and spoke with Aurther Loft and he informed me that they did have the rx and the pt has been calling since Friday I told him that his Rx was sent on Friday and lovm this morning he stated that they did get the vm and will take care of it.

## 2012-08-10 NOTE — Telephone Encounter (Signed)
Pt called and informed.Rodney Bright  

## 2012-08-14 ENCOUNTER — Encounter: Payer: Self-pay | Admitting: Cardiology

## 2012-08-14 ENCOUNTER — Ambulatory Visit (HOSPITAL_COMMUNITY): Payer: Self-pay | Admitting: Behavioral Health

## 2012-08-17 ENCOUNTER — Telehealth (HOSPITAL_COMMUNITY): Payer: Self-pay | Admitting: Psychiatry

## 2012-08-17 DIAGNOSIS — F329 Major depressive disorder, single episode, unspecified: Secondary | ICD-10-CM

## 2012-08-17 MED ORDER — ESCITALOPRAM OXALATE 20 MG PO TABS: 20.0000 mg | ORAL_TABLET | Freq: Every day | ORAL | Status: DC

## 2012-08-17 NOTE — Telephone Encounter (Signed)
Called patient to inform him a refill request for Lexapro was sent in, which was requested on Saturday 08/15/2012 at 1:17 PM .  I have informed the patient in the past that the outpatient clinic is not open on weekends an that he should call the clinic Monday through Thursday or Friday before the afternoon, to make sure he does not run out of medications. According to the fax request, the patient last filled Lexapro on 07/08/2012, which brings up concerns about compliance as he should have run out on 08/07/2012.

## 2012-09-01 ENCOUNTER — Ambulatory Visit (HOSPITAL_COMMUNITY): Payer: Self-pay | Admitting: Behavioral Health

## 2012-09-09 ENCOUNTER — Telehealth: Payer: Self-pay | Admitting: Family Medicine

## 2012-09-09 ENCOUNTER — Ambulatory Visit (INDEPENDENT_AMBULATORY_CARE_PROVIDER_SITE_OTHER): Payer: Self-pay | Admitting: Cardiology

## 2012-09-09 ENCOUNTER — Encounter: Payer: Self-pay | Admitting: Cardiology

## 2012-09-09 VITALS — BP 128/80 | HR 70 | Ht 70.0 in | Wt 396.0 lb

## 2012-09-09 DIAGNOSIS — I4891 Unspecified atrial fibrillation: Secondary | ICD-10-CM

## 2012-09-09 DIAGNOSIS — I10 Essential (primary) hypertension: Secondary | ICD-10-CM

## 2012-09-09 LAB — CBC
HCT: 38.1 % — ABNORMAL LOW (ref 39.0–52.0)
Hemoglobin: 12.6 g/dL — ABNORMAL LOW (ref 13.0–17.0)
MCH: 27.3 pg (ref 26.0–34.0)
MCHC: 33.1 g/dL (ref 30.0–36.0)
MCV: 82.6 fL (ref 78.0–100.0)
Platelets: 197 10*3/uL (ref 150–400)
RBC: 4.61 MIL/uL (ref 4.22–5.81)
RDW: 14.9 % (ref 11.5–15.5)
WBC: 8.8 10*3/uL (ref 4.0–10.5)

## 2012-09-09 LAB — BASIC METABOLIC PANEL WITH GFR
BUN: 24 mg/dL — ABNORMAL HIGH (ref 6–23)
CO2: 20 mEq/L (ref 19–32)
Calcium: 9.1 mg/dL (ref 8.4–10.5)
Chloride: 108 mEq/L (ref 96–112)
Creat: 1.65 mg/dL — ABNORMAL HIGH (ref 0.50–1.35)
GFR, Est African American: 54 mL/min — ABNORMAL LOW
GFR, Est Non African American: 46 mL/min — ABNORMAL LOW
Glucose, Bld: 147 mg/dL — ABNORMAL HIGH (ref 70–99)
Potassium: 4.3 mEq/L (ref 3.5–5.3)
Sodium: 138 mEq/L (ref 135–145)

## 2012-09-09 MED ORDER — HYDROCODONE-ACETAMINOPHEN 10-325 MG PO TABS: 1.0000 | ORAL_TABLET | Freq: Three times a day (TID) | ORAL | Status: DC | PRN

## 2012-09-09 NOTE — Patient Instructions (Addendum)
Your physician recommends that you schedule a follow-up appointment in: 3 MONTHS WITH DR Jens Som  Your physician has recommended that you wear an event monitor. Event monitors are medical devices that record the heart's electrical activity. Doctors most often [place] these monitors to diagnose arrhythmias. Arrhythmias are problems with the speed or rhythm of the heartbeat. The monitor is a small, portable device. You can wear one while you do your normal daily activities. This is usually used to diagnose what is causing palpitations/syncope (passing out).   Your physician recommends that you HAVE LAB WORK TODAY

## 2012-09-09 NOTE — Telephone Encounter (Signed)
Med has been [facility] [phone] spoke to [place] and gave rx.Rodney Bright [place]

## 2012-09-09 NOTE — Assessment & Plan Note (Signed)
The patient continues to have episodes of palpitations that are relatively frequent and he feels is similar to atrial fibrillation episode previously. His LV function and TSH are normal. I will place a CardioNet. If he indeed is having bouts of atrial fibrillation I will add flecainide 100 mg by mouth twice a day to help with rhythm control. He would need an exercise treadmill one week later to exclude exercise-induced ventricular tachycardia. Continue Cardizem. Continue xeralto. Check hemoglobin and renal function.

## 2012-09-09 NOTE — Telephone Encounter (Signed)
Patient walked-in and request a medicine refill one day in advance and ask if refill can please  be sent to [facility] pharmacy in [place]. Thanks

## 2012-09-09 NOTE — Progress Notes (Signed)
HPI: Pleasant male for fu of atrial fibrillation. Previously presented to [facility] with atrial fibrillation. Based on outside records LV function was normal. Stress echocardiogram in March of 2012 showed no ischemia. I last saw him in March of 2014 and he was complaining of increasing palpitations. We repeated his echocardiogram in March of 2014. This showed an ejection fraction of 40-45%, mild left atrial enlargement and grade 2 diastolic dysfunction. Study was technically difficult. Nuclear study in April of 2014 at [facility] showed an ejection fraction greater than 65% and normal perfusion. We also ordered a CardioNet with the thought that if he was having increasing bouts of atrial fibrillation we would add flecainide. However he did not have his monitor. Note TSH was normal. We initiated xeralto at last office visit. Since then, he has had 4 episodes of palpitations associated with dyspnea and dizziness. He also has fatigue. He otherwise does not have dyspnea on exertion, orthopnea, syncope or exertional chest pain.  Current Outpatient Prescriptions  Medication Sig Dispense Refill  . allopurinol (ZYLOPRIM) 100 MG tablet Take 1 tablet (100 mg total) by mouth daily.  30 tablet  11  . AMBULATORY NON FORMULARY MEDICATION Cyclobenzaprine 2% gabapentin 3% Baclofen 2%  Diclofenac 4% Qty# 120 ml Sig: apply small amount topically to effected area 2-3 times a day as directed 2 refills      . Blood Glucose Monitoring Suppl (BLOOD GLUCOSE METER) kit by Other route. Use as instructed and test BS daily.       Marland Kitchen diltiazem (CARDIZEM CD) 120 MG 24 hr capsule TAKE 1 CAPSULE EVERY DAY  90 capsule  1  . escitalopram (LEXAPRO) 20 MG tablet Take 1 tablet (20 mg total) by mouth daily. Generic please.  30 tablet  1  . GLUCOSAMINE-CHONDROITIN PO Take 1 tablet by mouth daily.      Marland Kitchen HYDROcodone-acetaminophen (NORCO) 10-325 MG per tablet Take 1 tablet by mouth every 8 (eight) hours as needed for pain.  90  tablet  0  . lisinopril (PRINIVIL,ZESTRIL) 10 MG tablet TAKE 1 TABLET EVERY DAY  30 tablet  0  . LORazepam (ATIVAN) 1 MG tablet TAKE ONE TABLET BY MOUTH EVERY 8 HOURS  90 tablet  0  . Lorcaserin HCl (BELVIQ) 10 MG TABS Take 10 mg by mouth 2 (two) times daily.      . metFORMIN (GLUCOPHAGE) 1000 MG tablet Take 1,000 mg by mouth daily.       . NON FORMULARY Joint Juice 1 daily      . Rivaroxaban (XARELTO) 15 MG TABS tablet Take 1 tablet (15 mg total) by mouth daily.  30 tablet  12  . Testosterone 10 MG/ACT (2%) GEL Place 10 mg onto the skin daily. Apply 1mL daily on to skin. Recheck in 12 weeks testosterone.  60 g  3  . zolpidem (AMBIEN) 5 MG tablet Take 1 tablet (5 mg total) by mouth at bedtime as needed for sleep.  30 tablet  0   No current facility-administered medications for this visit.     Past Medical History  Diagnosis Date  . Atrial fibrillation   . Lymphadenopathy   . Testosterone deficiency   . Anemia   . Vitamin D deficiency   . ED (erectile dysfunction)   . Morbid obesity   . OSA (obstructive sleep apnea)     CPAP-17.5 cm water pressure  . Kidney stones     stent, lithotripsy  . Lymphadenopathy   . Diabetes mellitus  controlled  . Testosterone deficiency   . Excess or deficiency of vitamin D   . ED (erectile dysfunction)   . Gout     Past Surgical History  Procedure Laterality Date  . Gastric bypass  2005    600 lbs prior to surgery  . Cholecystectomy    . Tonsillectomy    . Finger surgery      ulnar digital nerve and artery right index finger  . Cystoscopy      retrograde and double J catheter insertion.    History   Social History  . Marital Status: Married    Spouse Name: N/A    Number of Children: N/A  . Years of Education: N/A   Occupational History  . Not on file.   Social History Main Topics  . Smoking status: Never Smoker   . Smokeless tobacco: Not on file  . Alcohol Use: No  . Drug Use: No  . Sexually Active: Yes -- Male  partner(s)    Birth Control/ Protection: None     Comment: on full disability, separated, no regular exercise,walks some, drinks pot of coffee a day.   Other Topics Concern  . Not on file   Social History Narrative   Drinks one pot of coffee daily.   Regular exercise-no, walks some    ROS: no fevers or chills, productive cough, hemoptysis, dysphasia, odynophagia, melena, hematochezia, dysuria, hematuria, rash, seizure activity, orthopnea, PND, pedal edema, claudication. Remaining systems are negative.  Physical Exam: Well-developed obese in no acute distress.  Skin is warm and dry.  HEENT is normal.  Neck is supple.  Chest is clear to auscultation with normal expansion.  Cardiovascular exam is regular rate and rhythm.  Abdominal exam nontender or distended. No masses palpated. Extremities show no edema. neuro grossly intact

## 2012-09-09 NOTE — Assessment & Plan Note (Signed)
Blood pressure controlled. Continue present medications. 

## 2012-09-15 ENCOUNTER — Encounter: Payer: Self-pay | Admitting: Cardiology

## 2012-09-15 ENCOUNTER — Ambulatory Visit (INDEPENDENT_AMBULATORY_CARE_PROVIDER_SITE_OTHER): Payer: BC Managed Care – PPO | Admitting: Behavioral Health

## 2012-09-15 DIAGNOSIS — F331 Major depressive disorder, recurrent, moderate: Secondary | ICD-10-CM

## 2012-09-16 ENCOUNTER — Encounter (HOSPITAL_COMMUNITY): Payer: Self-pay | Admitting: Behavioral Health

## 2012-09-16 ENCOUNTER — Telehealth: Payer: Self-pay | Admitting: Cardiology

## 2012-09-16 NOTE — Progress Notes (Signed)
   THERAPIST PROGRESS NOTE  Session Time: 10:00  Participation Level: Active  Behavioral Response: CasualAlertDepressed  Type of Therapy: Individual Therapy  Treatment Goals addressed: Coping  Interventions: CBT  Summary: Rodney Bright is a 52 y.o. male who presents with depression.   Suicidal/Homicidal: Nowithout intent/plan  Therapist Response: I have not seen decline in one month. He indicated that he felt his depression level and worsened somewhat based on things currently taking place in his life. He did ask to speak to Dr. Hilton Cork about an increase in his Lexapro saying they had spoken about it in his previous appointment with Dr. Demetrius Charity. I told him that we would leave a message with the office staff or Dr. Demetrius Charity. to call him in regard to his medication. He did see Dr. Demetrius Charity. after the meeting and spoke briefly with him about his medication.  The client indicated that he has been somewhat depressed saying he feels he does not have a clear purpose in his life. He stated that not being able to complete the semester at [employer] college at frustrated him somewhat. He is considering doing online classes at [employer] which she felt would be easier. 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 We talked about compartmentalizing his life in particular in going back to school so he does not feel overwhelmed. He cited as an example the fact that he feels he can complete his undergraduate degree in less than 2 years if he only takes 2 classes per semester. He talked about taking 2 classes at a time and not looking beyond that. We talked about the fact that he could continue his education process after his undergraduate degree if he chose to but that he would feel some sense of completion with just the undergraduate degree. He talked about how he could use that degree in the field  that he would like to work in and that he would have the opportunity to use that degree in that skill for many years to come. He initially objected saying that he was 52 years old. I pointed out that he could be through with school by 54 and still have many years left to work. We used that situation as well as some conflict with the family member to talk about healthy emotional expression and coping skills for depression and anxiety. The client does contract for safety saying he has no thoughts of hurting himself or anyone else. He did say that medical issues to play into his depression at times but that he had seen a cardiologist and everything looked good.  Plan: Return again in 4 weeks.  Diagnosis: Axis I: 296.32    Axis II: Deferred    French Ana, [facility] 09/16/2012

## 2012-09-16 NOTE — Telephone Encounter (Signed)
New Problem  Pt states he is wearing a monitor and he had an episode last night. He said that he got a call from [facility] requesting pt to come in but he did not go. He wants to make sure that we got the transmission as well.

## 2012-09-16 NOTE — Telephone Encounter (Signed)
Routed to [employer]

## 2012-09-16 NOTE — Telephone Encounter (Signed)
Routing to [facility] [employer].

## 2012-09-17 ENCOUNTER — Telehealth: Payer: Self-pay | Admitting: *Deleted

## 2012-09-17 ENCOUNTER — Telehealth (HOSPITAL_COMMUNITY): Payer: Self-pay | Admitting: Psychiatry

## 2012-09-17 DIAGNOSIS — F329 Major depressive disorder, single episode, unspecified: Secondary | ICD-10-CM

## 2012-09-17 MED ORDER — ESCITALOPRAM OXALATE 10 MG PO TABS: 10.0000 mg | ORAL_TABLET | Freq: Three times a day (TID) | ORAL | Status: DC

## 2012-09-17 NOTE — Telephone Encounter (Signed)
Will have a trial increase of escitalopram to 10 mg TID.

## 2012-09-18 ENCOUNTER — Telehealth: Payer: Self-pay

## 2012-09-18 NOTE — Telephone Encounter (Signed)
Contacted Monitor company, [employer] at [phone], they have a record of the patient being called on 09-16-12 for a cardiac event, A. Fib at 160 bpm, for 10 min. They added Dr. Jens Som as a provider to his records so he could get his report. I asked them to fax [place] what they have currently.Received fax from [employer], showing Rapid A. Fib and runs of VTach at 300bpm. I shared this report with the DOD Dr. Sw[place] this date. Per Dr. Swaziland this patient needs to go to the ER. I called this patient and stressed the urgency of being seen immediately at the hospital and how lethal this could potentially be. The patient stated he understood and that he was not going until Saturday or Sunday because he has to work. I repeated that these arrhythmias could cause death without warning. I encouraged him to go to the hospital now. This patient stated he only wore the monitor when he felt "something coming on". I encouraged the patient to put the monitor on now and wear it at all times, and that he needed to be seen asap. He said he appreciated our concern.

## 2012-09-18 NOTE — Telephone Encounter (Signed)
Debby Freiberg RN has received monitor recordings and discussed with Dr Swaziland. She is calling patient and recommending going to ED

## 2012-09-21 ENCOUNTER — Emergency Department (INDEPENDENT_AMBULATORY_CARE_PROVIDER_SITE_OTHER)
Admission: EM | Admit: 2012-09-21 | Discharge: 2012-09-21 | Disposition: A | Discharge status: Home / Self Care | Payer: BC Managed Care – PPO | Source: Home / Self Care

## 2012-09-21 DIAGNOSIS — I4891 Unspecified atrial fibrillation: Secondary | ICD-10-CM

## 2012-09-21 NOTE — Telephone Encounter (Signed)
Please pull strips Rodney Bright  

## 2012-09-21 NOTE — Telephone Encounter (Signed)
msg left/ I will forward to Dr and nurse to follow up with.

## 2012-09-21 NOTE — ED Notes (Signed)
Patient here for EKG only.

## 2012-09-21 NOTE — Telephone Encounter (Signed)
New Problem  Pt state he had an EKG done at the [facility] site. He said he was advised they we would see them online. He would like to know what the doctor thinks about the results.

## 2012-09-25 NOTE — Telephone Encounter (Signed)
Spoke with pt, he is aware of the need for hospitalization because of his rhythm. He decline to be admitted to the hosp until Sunday. Per dr Jens Som the pt will be scheduled to see dr allred Monday 09-28-12 at 12:15p. He voiced understanding to get to the hosp should he have problems over the weekend. Pt also voiced the understanding of sudden death if he has this rhythm again.

## 2012-09-25 NOTE — Telephone Encounter (Signed)
Dr Jens Som reviewed monitor strips with dr Graciela Husbands, it appears like the rhythm on the monitor is V-tach. Per dr Jens Som the pt needs to be admitted to the hosp. If the pt refuses to go to the hosp he will need to be scheduled to see dr Fayrene Fearing allred asap. Left message for pt to call

## 2012-09-26 ENCOUNTER — Other Ambulatory Visit: Payer: Self-pay | Admitting: Family Medicine

## 2012-09-28 ENCOUNTER — Encounter: Payer: Self-pay | Admitting: Internal Medicine

## 2012-09-28 ENCOUNTER — Ambulatory Visit (INDEPENDENT_AMBULATORY_CARE_PROVIDER_SITE_OTHER): Payer: BC Managed Care – PPO | Admitting: Internal Medicine

## 2012-09-28 VITALS — BP 110/70 | HR 72 | Ht 68.0 in | Wt >= 6400 oz

## 2012-09-28 DIAGNOSIS — G4730 Sleep apnea, unspecified: Secondary | ICD-10-CM

## 2012-09-28 DIAGNOSIS — I4891 Unspecified atrial fibrillation: Secondary | ICD-10-CM

## 2012-09-28 MED ORDER — FLECAINIDE ACETATE 100 MG PO TABS: 100.0000 mg | ORAL_TABLET | Freq: Two times a day (BID) | ORAL | Status: DC

## 2012-09-28 MED ORDER — DILTIAZEM HCL ER COATED BEADS 240 MG PO CP24: 240.0000 mg | ORAL_CAPSULE | Freq: Every day | ORAL | Status: DC

## 2012-09-28 MED ORDER — LISINOPRIL 10 MG PO TABS: 10.0000 mg | ORAL_TABLET | Freq: Every day | ORAL | Status: DC

## 2012-09-28 NOTE — Progress Notes (Signed)
Primary Care Physician: Nani Gasser, MD Referring Physician:  Dr Daleen Snook is a 52 y.o. male with a h/o morbid obesity, DM, and paroxysmal atrial fibrillation with an associated wide complex tachycardia who presents today for EP consultation.  He reports having tachypalpitations for 3-4 years.  He reports that episodes would occur with moderate activity.  He reports associated lightheadedness and shortness of breath.  He has not had syncope.  Episodes would typically last several hours.  He reports that he would lay down and the episode would resolve.  He was managed with diltiazem and coumadin. Prior to addition of diltiazem, he reports having syncope (4 years ago).   He reports that episodes improved with diltiazem.  He reports that episodes occur presently every 6 weeks or so.  He continues to feel that exertion is a trigger for him.  He also reports having episodes with emotional stress.     Today, he denies symptoms of chest pain, orthopnea, PND, syncope, or neurologic sequela.  + edema chronically.   The patient is tolerating medications without difficulties and is otherwise without complaint today.   Past Medical History  Diagnosis Date  . Paroxysmal atrial fibrillation   . Lymphadenopathy   . Testosterone deficiency   . Anemia   . Vitamin D deficiency   . ED (erectile dysfunction)   . Morbid obesity   . OSA (obstructive sleep apnea)     CPAP-17.5 cm water pressure  . Kidney stones     stent, lithotripsy  . Diabetes mellitus     controlled  . Gout   . S/P emergency tracheotomy for assistance in breathing     at age 16  . Pneumonia    Past Surgical History  Procedure Laterality Date  . Gastric bypass  2005    600 lbs prior to surgery  . Cholecystectomy    . Tonsillectomy    . Finger surgery      ulnar digital nerve and artery right index finger  . Cystoscopy      retrograde and double J catheter insertion.    Current Outpatient Prescriptions    Medication Sig Dispense Refill  . allopurinol (ZYLOPRIM) 100 MG tablet Take 1 tablet (100 mg total) by mouth daily.  30 tablet  11  . AMBULATORY NON FORMULARY MEDICATION Cyclobenzaprine 2% gabapentin 3% Baclofen 2%  Diclofenac 4% Qty# 120 ml Sig: apply small amount topically to effected area 2-3 times a day as directed 2 refills      . Blood Glucose Monitoring Suppl (BLOOD GLUCOSE METER) kit by Other route. Use as instructed and test BS daily.       Marland Kitchen diltiazem (CARDIZEM CD) 120 MG 24 hr capsule TAKE 1 CAPSULE EVERY DAY  90 capsule  1  . escitalopram (LEXAPRO) 10 MG tablet Take 1 tablet (10 mg total) by mouth 3 (three) times daily. Generic please.  90 tablet  0  . GLUCOSAMINE-CHONDROITIN PO Take 1 tablet by mouth daily.      Marland Kitchen HYDROcodone-acetaminophen (NORCO) 10-325 MG per tablet Take 1 tablet by mouth every 8 (eight) hours as needed for pain.  90 tablet  0  . lisinopril (PRINIVIL,ZESTRIL) 10 MG tablet TAKE 1 TABLET EVERY DAY  90 tablet  0  . LORazepam (ATIVAN) 1 MG tablet TAKE ONE TABLET BY MOUTH EVERY 8 HOURS  90 tablet  0  . Lorcaserin HCl (BELVIQ) 10 MG TABS Take 10 mg by mouth 2 (two) times daily.      Marland Kitchen  metFORMIN (GLUCOPHAGE) 1000 MG tablet Take 1,000 mg by mouth daily.       . NON FORMULARY Joint Juice 1 daily      . Rivaroxaban (XARELTO) 15 MG TABS tablet Take 1 tablet (15 mg total) by mouth daily.  30 tablet  12  . Testosterone 10 MG/ACT (2%) GEL Place 10 mg onto the skin daily. Apply 1mL daily on to skin. Recheck in 12 weeks testosterone.  60 g  3  . zolpidem (AMBIEN) 5 MG tablet Take 1 tablet (5 mg total) by mouth at bedtime as needed for sleep.  30 tablet  0   No current facility-administered medications for this visit.    Allergies  Allergen Reactions  . Colchicine Shortness Of Breath and Swelling    History   Social History  . Marital Status: Married    Spouse Name: N/A    Number of Children: N/A  . Years of Education: N/A   Occupational History  . Not on file.    Social History Main Topics  . Smoking status: Never Smoker   . Smokeless tobacco: Not on file  . Alcohol Use: No  . Drug Use: Yes     Comment: prior cocaine abuse (heavy) in his 35s.  denies subsequent use  . Sexually Active: Yes -- Male partner(s)    Birth Control/ Protection: None     Comment: on full disability, separated, no regular exercise,walks some, drinks pot of coffee a day.   Other Topics Concern  . Not on file   Social History Narrative   Drinks one pot of coffee daily.   Regular exercise-no, walks some      Lives in [place] with roommate.   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    Family History  Problem Relation Age of Onset  . Cancer Mother     lung, heavy smoker  . Hypertension Mother   . Hyperlipidemia Mother   . Cancer Father     melanoma  . Aneurysm Father     cardiac  . Hyperlipidemia Father   . Hypertension Father   . Hyperlipidemia Sister   . Hypertension Sister   . Hyperlipidemia Brother   . Hypertension Brother   . Stroke Other     ROS- All systems are reviewed and negative except as per the HPI above  Physical Exam: Filed Vitals:   09/28/12 1225  BP: 110/70  Pulse: 72  Height: 5\' 8"  (1.727 m)  Weight: 402 lb 6.4 oz (182.527 kg)  SpO2: 97%    GEN- The patient is morbidly obese appearing, alert and oriented x 3 today.   Head- normocephalic, atraumatic Eyes-  Sclera clear, conjunctiva pink Ears- hearing intact Oropharynx- clear Neck- supple  Lungs- Clear to ausculation bilaterally, normal work of breathing Heart- Regular rate and rhythm, no murmurs, rubs or gallops, PMI not laterally displaced GI- soft, NT, ND, + BS Extremities- no clubbing, cyanosis, or edema MS- no significant deformity or atrophy Skin- no rash or lesion Psych- euthymic mood, full affect Neuro- strength and sensation are intact  EKGs/ event monitor and Dr Waunita Schooner notes are reviewed today  Assessment and Plan:  1.  Afib/ atrial flutter The patient has a h/o atrial fibrillation, likely worsened by his obesity and sleep apnea.  I have reviewed his event monitor which reveals elevated ventricular rates.  The wide complex tachycardia on the monitor is not VT but is clearly 1:1 conducting atrial flutter.  I would therefore  recommend better rate control with diltiazem increased to 240mg  daily.  In addition, I will start flecainide 100mg  BID  for treatment of his atrial arrhythmias. He will return for GXT to evaluate for exercise induced arrhythmias on flecainide.  2. OSA Compliance with CPAP advised  3. Obesity Weight loss is advised

## 2012-09-28 NOTE — Patient Instructions (Addendum)
Your physician recommends that you schedule a follow-up appointment in: 2 months with Dr Johney Frame   Your physician has requested that you have an exercise tolerance test. For further information please visit [email or web address] Please also follow instruction sheet, as given.---7-10 days with a PA  Your physician has recommended you make the following change in your medication:  1) Increase Cardizem to 240mg  daily 2) Start Flecainide 100mg  twice daily

## 2012-09-29 ENCOUNTER — Other Ambulatory Visit: Payer: Self-pay | Admitting: *Deleted

## 2012-09-29 ENCOUNTER — Ambulatory Visit (HOSPITAL_COMMUNITY): Payer: Self-pay | Admitting: Psychiatry

## 2012-09-29 MED ORDER — ZOLPIDEM TARTRATE 5 MG PO TABS: 5.0000 mg | ORAL_TABLET | Freq: Every evening | ORAL | Status: DC | PRN

## 2012-10-08 ENCOUNTER — Ambulatory Visit (INDEPENDENT_AMBULATORY_CARE_PROVIDER_SITE_OTHER): Payer: BC Managed Care – PPO | Admitting: Behavioral Health

## 2012-10-08 ENCOUNTER — Encounter (HOSPITAL_COMMUNITY): Payer: Self-pay | Admitting: Behavioral Health

## 2012-10-08 DIAGNOSIS — F331 Major depressive disorder, recurrent, moderate: Secondary | ICD-10-CM

## 2012-10-08 NOTE — Progress Notes (Signed)
   THERAPIST PROGRESS NOTE  Session Time: 3:00  Participation Level: Active  Behavioral Response: CasualAlertDepressed  Type of Therapy: Individual Therapy  Treatment Goals addressed: Coping  Interventions: CBT  Summary: Rodney Bright is a 52 y.o. male who presents with depression.   Suicidal/Homicidal: Nowithout intent/plan  Therapist Response: The client did report that the increase in his Lexapro had made some difference in reduction of depression. He has had some medical conditions since the last session saying that he has a fibrillation and had a couple of incidences in which his heart rate up to 400 beats per minute. His cardiologist has been wearing a monitor her meds changed some of his medication with the hopes of reducing the amount of times this happens in the amount of heart beats per minute. He indicates that the medication is not successful in resolving it he may have to have surgery. He indicates that he at this point does not have any significant anxiety related to that.  We also talked about the client feeling some sense of productivity with his wife. He stated that he has revisited the idea of going to truck driving school. He indicated that he wanted to do that when he was younger but his wife is not in favor. I encouraged him to least check into what the requirements would be in look into some of the local driving schools. The client did appear to brighten some when talking about going back to truck driving school. He did not rule out the possibility of finishing his degree thinking he may be able to do both. He reports otherwise he feels pretty good and reports no other significant life events. He does contract for safety saying he has no thoughts of hurting himself or anyone else. Plan: Return again in 4 weeks.  Diagnosis: Axis I: 296.32    Axis II: Deferred    [place] Ana, [facility] 10/08/2012

## 2012-10-13 ENCOUNTER — Other Ambulatory Visit: Payer: Self-pay | Admitting: *Deleted

## 2012-10-13 MED ORDER — LORAZEPAM 1 MG PO TABS: ORAL_TABLET | ORAL | Status: DC

## 2012-10-15 ENCOUNTER — Other Ambulatory Visit: Payer: Self-pay

## 2012-10-15 ENCOUNTER — Encounter: Payer: Self-pay | Admitting: Physician Assistant

## 2012-10-22 ENCOUNTER — Other Ambulatory Visit (HOSPITAL_COMMUNITY): Payer: Self-pay | Admitting: Psychiatry

## 2012-10-22 DIAGNOSIS — F329 Major depressive disorder, single episode, unspecified: Secondary | ICD-10-CM

## 2012-10-26 ENCOUNTER — Telehealth (HOSPITAL_COMMUNITY): Payer: Self-pay

## 2012-10-26 ENCOUNTER — Ambulatory Visit (INDEPENDENT_AMBULATORY_CARE_PROVIDER_SITE_OTHER): Payer: BC Managed Care – PPO | Admitting: Behavioral Health

## 2012-10-26 ENCOUNTER — Encounter (HOSPITAL_COMMUNITY): Payer: Self-pay | Admitting: Behavioral Health

## 2012-10-26 DIAGNOSIS — F331 Major depressive disorder, recurrent, moderate: Secondary | ICD-10-CM

## 2012-10-26 NOTE — Progress Notes (Signed)
   THERAPIST PROGRESS NOTE  Session Time: 11:00  Participation Level: Active  Behavioral Response: CasualAlertDepressed  Type of Therapy: Individual Therapy  Treatment Goals addressed: Coping  Interventions: CBT  Summary: Rodney Bright is a 52 y.o. male who presents with depression.   Suicidal/Homicidal: Nowithout intent/plan  Therapist Response: The client reported good news saying that the medication prescribed by his cardiologist had eliminated the heart palpitations and increased heart rate. He also indicated that the increase in Lexapro to 30 mg have reduced his irritability although he and Dr. Demetrius Charity. had discussed going to 40 mg. The client feels that would be beneficial. I reminded him that Dr. Demetrius Charity. was out for 2 weeks but that Dr. Christell Constant would be back on July 28 and would be covering for Dr. Demetrius Charity. He agreed to wait till them.  The balance of the session we talked about family dynamics both historically in terms of how they affected the clients current relationships with his siblings. We talked about the difficulty with forgiveness but the importance of forgiveness in terms of letting go whether or not a healthy relationship follows that forgiveness. The client is having difficulty forgiving a sister and a brother. We talked about how that is a part of him becoming healthier and increasing his at wellness. He does still report mild to moderate depression depending on circumstances. He recognizes that not being where he wants to be in life vocationally plays into that. He continues to look into other options either educationally or vocationally. He does contract for safety saying he has no thoughts of hurting himself or anyone else.  Plan: Return again in 3 weeks.  Diagnosis: Axis I: 296.32    Axis II: Deferred    Rodney Bright, [facility] 10/26/2012

## 2012-10-27 NOTE — Telephone Encounter (Signed)
This was ordered by Dr. Demetrius Charity on 10/22/12

## 2012-10-31 ENCOUNTER — Other Ambulatory Visit: Payer: Self-pay | Admitting: Family Medicine

## 2012-11-02 ENCOUNTER — Encounter: Payer: Self-pay | Admitting: Internal Medicine

## 2012-11-02 ENCOUNTER — Other Ambulatory Visit: Payer: Self-pay | Admitting: Nurse Practitioner

## 2012-11-02 ENCOUNTER — Ambulatory Visit (INDEPENDENT_AMBULATORY_CARE_PROVIDER_SITE_OTHER): Payer: BC Managed Care – PPO | Admitting: Physician Assistant

## 2012-11-02 DIAGNOSIS — I4891 Unspecified atrial fibrillation: Secondary | ICD-10-CM

## 2012-11-02 DIAGNOSIS — Z79899 Other long term (current) drug therapy: Secondary | ICD-10-CM

## 2012-11-02 DIAGNOSIS — R0602 Shortness of breath: Secondary | ICD-10-CM

## 2012-11-02 NOTE — Progress Notes (Signed)
Exercise Treadmill Test  Pre-Exercise Testing Evaluation Rhythm: normal sinus  Rate: 68     Test  Exercise Tolerance Test Ordering MD: Hillis Range, MD  Interpreting MD: Brynda Rim  Unique Test No: 1  Treadmill:  1  Indication for ETT: SOB, Medication Management(Flecainide), A-Fib  Contraindication to ETT: No   Stress Modality: exercise - treadmill  Cardiac Imaging Performed: non   Protocol: standard Bruce - maximal  Max BP:  138/45  Max MPHR (bpm):  166 85% MPR (bpm):  141  MPHR obtained (bpm):  131 % MPHR obtained:  79  Reached 85% MPHR (min:sec):  n/a Total Exercise Time (min-sec):  2:26  Workload in METS:  4.6 Borg Scale: 14  Reason ETT Terminated:  technical difficulties monitoring (EKG or BP)    ST Segment Analysis At Rest: normal ST segments - no evidence of significant ST depression With Exercise: rate related LBBB developed at HR 125  Other Information Arrhythmia:  No Angina during ETT:  absent (0) Quality of ETT:  non-diagnostic  ETT Interpretation:  No exercise induced ventricular arrhythmias  Comments: Fair exercise tolerance. No chest pain. Normal BP response to exercise. No exercise induced ventricular arrhythmias.  Recommendations: Patient developed rate related LBBB. I stopped the ETT after this developed as it was hard to know if this was just a LBBB or VT at the time.   I reviewed ECGs with Dr. Olga Millers who also knows this patient. After review of the ECGs, this is felt to be a rate related LBBB. ETT is adequate to r/o exercise induced ventricular arrhythmias. He may continue Flecainide. He is also worried about his BP.  BP likely lower from increase in Diltiazem.  He admits he is tired.   I have advised him to decrease Lisinopril to 1/2 tab daily. Signed,  Tereso Newcomer, PA-C   11/02/2012 3:40 PM

## 2012-11-05 ENCOUNTER — Other Ambulatory Visit: Payer: Self-pay | Admitting: *Deleted

## 2012-11-05 MED ORDER — HYDROCODONE-ACETAMINOPHEN 10-325 MG PO TABS: 1.0000 | ORAL_TABLET | Freq: Three times a day (TID) | ORAL | Status: DC | PRN

## 2012-11-09 ENCOUNTER — Ambulatory Visit (HOSPITAL_COMMUNITY): Payer: Self-pay | Admitting: Behavioral Health

## 2012-11-17 ENCOUNTER — Ambulatory Visit (INDEPENDENT_AMBULATORY_CARE_PROVIDER_SITE_OTHER): Payer: BC Managed Care – PPO | Admitting: Psychiatry

## 2012-11-17 ENCOUNTER — Encounter (HOSPITAL_COMMUNITY): Payer: Self-pay | Admitting: Psychiatry

## 2012-11-17 VITALS — BP 111/77 | HR 68 | Ht 68.0 in | Wt 394.0 lb

## 2012-11-17 DIAGNOSIS — F329 Major depressive disorder, single episode, unspecified: Secondary | ICD-10-CM

## 2012-11-17 DIAGNOSIS — F1921 Other psychoactive substance dependence, in remission: Secondary | ICD-10-CM

## 2012-11-17 DIAGNOSIS — F332 Major depressive disorder, recurrent severe without psychotic features: Secondary | ICD-10-CM

## 2012-11-17 MED ORDER — ESCITALOPRAM OXALATE 10 MG PO TABS: ORAL_TABLET | ORAL | Status: DC
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
## 2012-11-18 ENCOUNTER — Ambulatory Visit: Payer: Self-pay | Admitting: Internal Medicine

## 2012-11-25 ENCOUNTER — Encounter (HOSPITAL_COMMUNITY): Payer: Self-pay | Admitting: Behavioral Health

## 2012-11-25 ENCOUNTER — Ambulatory Visit (INDEPENDENT_AMBULATORY_CARE_PROVIDER_SITE_OTHER): Payer: BC Managed Care – PPO | Admitting: Behavioral Health

## 2012-11-25 DIAGNOSIS — F331 Major depressive disorder, recurrent, moderate: Secondary | ICD-10-CM
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
## 2012-12-03 ENCOUNTER — Other Ambulatory Visit: Payer: Self-pay | Admitting: Family Medicine

## 2012-12-03 ENCOUNTER — Other Ambulatory Visit: Payer: Self-pay | Admitting: *Deleted

## 2012-12-03 MED ORDER — ZOLPIDEM TARTRATE 5 MG PO TABS: 5.0000 mg | ORAL_TABLET | Freq: Every evening | ORAL | Status: DC | PRN

## 2012-12-03 MED ORDER — HYDROCODONE-ACETAMINOPHEN 10-325 MG PO TABS: 1.0000 | ORAL_TABLET | Freq: Three times a day (TID) | ORAL | Status: DC | PRN

## 2012-12-09 ENCOUNTER — Ambulatory Visit (INDEPENDENT_AMBULATORY_CARE_PROVIDER_SITE_OTHER): Payer: BC Managed Care – PPO | Admitting: Behavioral Health

## 2012-12-09 DIAGNOSIS — F331 Major depressive disorder, recurrent, moderate: Secondary | ICD-10-CM

## 2012-12-10 ENCOUNTER — Encounter (HOSPITAL_COMMUNITY): Payer: Self-pay | Admitting: Behavioral Health

## 2012-12-10 NOTE — Progress Notes (Signed)
   THERAPIST PROGRESS NOTE  Session Time: 2:00  Participation Level: Active  Behavioral Response: CasualAlertDepressed  Type of Therapy: Individual Therapy  Treatment Goals addressed: Coping  Interventions: CBT  Summary: Rodney Bright is a 52 y.o. male who presents with depression.   Suicidal/Homicidal: Nowithout intent/plan  Therapist Response: The client presents today indicating that physically he is feeling pretty good. He indicated that lately his thought process has been trying to understand a balance between being in a" comfortable" place in life versus is he challenging himself. He reports that he is in a job that he is okay with, in an apartment and her roommate situation which he is okay with and is able to pay the bills and make some money. He reported that some of the left feels very good but feels level he realizes that he is not being stimulated intellectually or emotionally. He is dating someone but indicates that although he enjoys her company he knows that probably is not long term.  We spent the majority of the session processing other ways that he could stimulate himself intellectually or emotionally including whether through vocation, hobby, relationship. We talked about disappointments in life and how he could use those to make changes to his lifestyle and also find ways to bring joy to his life. The client does contract for safety having no thoughts of hurting himself or anyone else. He reports that he is medication compliant and that his depression and anxiety are manageable  Plan: Return again in 4 weeks.  Diagnosis: Axis I: 296.32    Axis II: Deferred    Seraj Dunnam M, [facility] 12/10/2012

## 2012-12-16 ENCOUNTER — Ambulatory Visit (INDEPENDENT_AMBULATORY_CARE_PROVIDER_SITE_OTHER): Payer: Self-pay | Admitting: Cardiology

## 2012-12-16 ENCOUNTER — Encounter: Payer: Self-pay | Admitting: Cardiology

## 2012-12-16 ENCOUNTER — Telehealth: Payer: Self-pay | Admitting: *Deleted

## 2012-12-16 ENCOUNTER — Telehealth: Payer: Self-pay | Admitting: Family Medicine

## 2012-12-16 VITALS — BP 124/82 | HR 67 | Wt 371.0 lb

## 2012-12-16 DIAGNOSIS — I10 Essential (primary) hypertension: Secondary | ICD-10-CM

## 2012-12-16 DIAGNOSIS — I4891 Unspecified atrial fibrillation: Secondary | ICD-10-CM

## 2012-12-16 MED ORDER — LORAZEPAM 1 MG PO TABS: ORAL_TABLET | ORAL | Status: DC

## 2012-12-16 NOTE — Assessment & Plan Note (Signed)
Patient counseled on weight loss. 

## 2012-12-16 NOTE — Assessment & Plan Note (Signed)
Blood pressure controlled.continue present medications. 

## 2012-12-16 NOTE — Telephone Encounter (Signed)
Dr. Linford Arnold patient walked-in scheduled and appointment for 12/21/12 at 11:15am and request to know if he can have lab work done before his appointment and he seen Dr. Jens Som and was told to have you add BMET CBC w/ Diff to his lab order as well. And if a nurse can call him to and advise him he can possibly come this week for labs before Monday

## 2012-12-16 NOTE — Patient Instructions (Addendum)
Your physician recommends that you have lab drawn on the day of your physical with Dr. Eppie Gibson:  BMET, CBC w/diff  Your physician wants you to follow-up in: 6 months with Dr. Jens Som. You will receive a reminder letter in the mail two months in advance. If you don't receive a letter, please call our office to schedule the follow-up appointment.

## 2012-12-16 NOTE — Assessment & Plan Note (Signed)
Patient remains in sinus rhythm. Continue flecainide, Cardizem and xeralto. Check hemoglobin and renal function.

## 2012-12-16 NOTE — Progress Notes (Signed)
HPI: Pleasant male for fu of atrial fibrillation. Previously presented to [facility] with atrial fibrillation. Based on outside records LV function was normal. Stress echocardiogram in March of 2012 showed no ischemia. Echocardiogram in March of 2014 showed an ejection fraction of 40-45%, mild left atrial enlargement and grade 2 diastolic dysfunction. Study was technically difficult. Nuclear study in April of 2014 at [facility] showed an ejection fraction greater than 65% and normal perfusion. Patient had an outpatient monitor for recurrent palpitations which revealed wide-complex tachycardia. The patient seen by Dr. Johney Frame and this was felt to be atrial flutter with one-to-one conduction. He was started on flecainide. followup exercise treadmill showed no induced ventricular arrhythmias but there was a related left bundle branch block. Since he was last seen, the patient denies any dyspnea on exertion, orthopnea, PND, pedal edema, palpitations, syncope or chest pain.   Current Outpatient Prescriptions  Medication Sig Dispense Refill  . allopurinol (ZYLOPRIM) 100 MG tablet TAKE 1 TABLET EVERY DAY  30 tablet  6  . AMBULATORY NON FORMULARY MEDICATION Cyclobenzaprine 2% gabapentin 3% Baclofen 2%  Diclofenac 4% Qty# 120 ml Sig: apply small amount topically to effected area 2-3 times a day as directed 2 refills      . Blood Glucose Monitoring Suppl (BLOOD GLUCOSE METER) kit by Other route. Use as instructed and test BS daily.       Marland Kitchen diltiazem (CARDIZEM CD) 240 MG 24 hr capsule Take 1 capsule (240 mg total) by mouth daily.  90 capsule  3  . escitalopram (LEXAPRO) 10 MG tablet Take 20 mg QAM, 10 mg at 4 PM, 10 mg at 12 AM.  120 tablet  1  . flecainide (TAMBOCOR) 100 MG tablet Take 1 tablet (100 mg total) by mouth 2 (two) times daily.  180 tablet  3  . GLUCOSAMINE-CHONDROITIN PO Take 1 tablet by mouth daily.      Marland Kitchen HYDROcodone-acetaminophen (NORCO) 10-325 MG per tablet Take 1 tablet by mouth  every 8 (eight) hours as needed for pain.  90 tablet  0  . lisinopril (PRINIVIL,ZESTRIL) 10 MG tablet 1/2 tab po qd      . LORazepam (ATIVAN) 1 MG tablet TAKE ONE TABLET BY MOUTH EVERY 8 HOURS  90 tablet  0  . metFORMIN (GLUCOPHAGE) 1000 MG tablet Take 1,000 mg by mouth as needed.       . NON FORMULARY Joint Juice 1 daily      . Rivaroxaban (XARELTO) 15 MG TABS tablet Take 1 tablet (15 mg total) by mouth daily.  30 tablet  12  . Testosterone 10 MG/ACT (2%) GEL Place 10 mg onto the skin daily. Apply 1mL daily on to skin. Recheck in 12 weeks testosterone.  60 g  3  . zolpidem (AMBIEN) 5 MG tablet Take 1 tablet (5 mg total) by mouth at bedtime as needed for sleep.  30 tablet  0   No current facility-administered medications for this visit.     Past Medical History  Diagnosis Date  . Paroxysmal atrial fibrillation   . Lymphadenopathy   . Testosterone deficiency   . Anemia   . Vitamin D deficiency   . ED (erectile dysfunction)   . Morbid obesity   . OSA (obstructive sleep apnea)     CPAP-17.5 cm water pressure  . Kidney stones     stent, lithotripsy  . Diabetes mellitus     controlled  . Gout   . S/P emergency tracheotomy for assistance in breathing  at age 1  . Pneumonia     Past Surgical History  Procedure Laterality Date  . Gastric bypass  2005    600 lbs prior to surgery  . Cholecystectomy    . Tonsillectomy    . Finger surgery      ulnar digital nerve and artery right index finger  . Cystoscopy      retrograde and double J catheter insertion.    History   Social History  . Marital Status: Married    Spouse Name: N/A    Number of Children: N/A  . Years of Education: N/A   Occupational History  . Not on file.   Social History Main Topics  . Smoking status: Never Smoker   . Smokeless tobacco: Not on file  . Alcohol Use: No  . Drug Use: No     Comment: prior cocaine abuse (heavy) in his 62s.  denies subsequent use  . Sexual Activity: Yes    Partners:  Female    Copy: None     Comment: on full disability, separated, no regular exercise,walks some, drinks pot of coffee a day.   Other Topics Concern  . Not on file   Social History Narrative   Drinks one pot of coffee daily.   Regular exercise-no, walks some      Lives in [place] with roommate.   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    ROS: no fevers or chills, productive cough, hemoptysis, dysphasia, odynophagia, melena, hematochezia, dysuria, hematuria, rash, seizure activity, orthopnea, PND, pedal edema, claudication. Remaining systems are negative.  Physical Exam: Well-developed obese in no acute distress.  Skin is warm and dry.  HEENT is normal.  Neck is supple.  Chest is clear to auscultation with normal expansion.  Cardiovascular exam is regular rate and rhythm.  Abdominal exam nontender or distended. No masses palpated. Extremities show no edema. neuro grossly intact  ECG sinus rhythm at a rate of 67. Left axis deviation. No ST changes.

## 2012-12-16 NOTE — Telephone Encounter (Signed)
Spoke with terry .Loralee Pacas Ashkum

## 2012-12-17 NOTE — Telephone Encounter (Signed)
Okay to order CBC, CMP, and lipid panel.

## 2012-12-17 NOTE — Telephone Encounter (Signed)
LMOM that labs have been faxed to [facility] and can go at anytime to have done before appt and to make sure he is fasting because checking cholesterol. Barry Dienes, LPN

## 2012-12-21 ENCOUNTER — Ambulatory Visit (INDEPENDENT_AMBULATORY_CARE_PROVIDER_SITE_OTHER): Payer: BC Managed Care – PPO | Admitting: Family Medicine

## 2012-12-21 ENCOUNTER — Encounter (HOSPITAL_COMMUNITY): Payer: Self-pay | Admitting: Behavioral Health

## 2012-12-21 ENCOUNTER — Encounter: Payer: Self-pay | Admitting: Family Medicine

## 2012-12-21 ENCOUNTER — Ambulatory Visit (HOSPITAL_COMMUNITY): Payer: Self-pay | Admitting: Psychiatry

## 2012-12-21 VITALS — BP 112/76 | HR 73 | Wt 393.0 lb

## 2012-12-21 DIAGNOSIS — G4733 Obstructive sleep apnea (adult) (pediatric): Secondary | ICD-10-CM

## 2012-12-21 DIAGNOSIS — G609 Hereditary and idiopathic neuropathy, unspecified: Secondary | ICD-10-CM | POA: Insufficient documentation

## 2012-12-21 DIAGNOSIS — H9211 Otorrhea, right ear: Secondary | ICD-10-CM

## 2012-12-21 DIAGNOSIS — Z0000 Encounter for general adult medical examination without abnormal findings: Secondary | ICD-10-CM

## 2012-12-21 DIAGNOSIS — E291 Testicular hypofunction: Secondary | ICD-10-CM

## 2012-12-21 DIAGNOSIS — H9210 Otorrhea, unspecified ear: Secondary | ICD-10-CM

## 2012-12-21 DIAGNOSIS — J340 Abscess, furuncle and carbuncle of nose: Secondary | ICD-10-CM

## 2012-12-21 DIAGNOSIS — E119 Type 2 diabetes mellitus without complications: Secondary | ICD-10-CM

## 2012-12-21 DIAGNOSIS — Z23 Encounter for immunization: Secondary | ICD-10-CM

## 2012-12-21 DIAGNOSIS — R040 Epistaxis: Secondary | ICD-10-CM

## 2012-12-21 DIAGNOSIS — Z1211 Encounter for screening for malignant neoplasm of colon: Secondary | ICD-10-CM

## 2012-12-21 DIAGNOSIS — J3489 Other specified disorders of nose and nasal sinuses: Secondary | ICD-10-CM

## 2012-12-21 LAB — POCT GLYCOSYLATED HEMOGLOBIN (HGB A1C): Hemoglobin A1C: 6.1

## 2012-12-21 MED ORDER — MUPIROCIN 2 % EX OINT: TOPICAL_OINTMENT | Freq: Every day | CUTANEOUS | Status: DC

## 2012-12-21 MED ORDER — METFORMIN HCL 500 MG PO TABS: 500.0000 mg | ORAL_TABLET | ORAL | Status: DC | PRN

## 2012-12-21 NOTE — Patient Instructions (Signed)
Keep up a regular exercise program and make sure you are eating a healthy diet Try to eat 4 servings of dairy a day, or if you are lactose intolerant take a calcium with vitamin D daily.  Your vaccines are up to date.   

## 2012-12-21 NOTE — Progress Notes (Signed)
Subjective:    Patient ID: Rodney Bright, male    DOB: 09-06-1960, 52 y.o.   MRN: [id number]  HPI  Here for CPE today. He follows with cardiology for his atrial fibrillation.  Now on flecanide.    He also requests a referral to ear nose and throat.  Says the ear smells like a festering wound.  Says smell comes and goes. Also having frequent nosebleeds.  Constantly getting crusting inside the nose. No fever. No upper respiratory symptoms.  He is well overdue for diabetic checkup.no hypglycemic events.  No wounds that are not healing well. He says is taking his metformin once a day.  Sleep apnea-he says his current machine is about 27 or 54 years old. It has been years since he got new equipment. He says he has never had a humidifier on his machine.  Hypogonadism-he would like to try switching to AndroGel , now he has insurance, instead of the compounded testosterone. We would likely have to start him at 3 or 4 pumps daily to be equivalent to what he is currently using.   Review of Systems Comprehensive ROS is neg except for HPI notes.   There were no vitals taken for this visit.    Allergies  Allergen Reactions  . Colchicine Shortness Of Breath and Swelling    Past Medical History  Diagnosis Date  . Paroxysmal atrial fibrillation   . Lymphadenopathy   . Testosterone deficiency   . Anemia   . Vitamin D deficiency   . ED (erectile dysfunction)   . Morbid obesity   . OSA (obstructive sleep apnea)     CPAP-17.5 cm water pressure  . Kidney stones     stent, lithotripsy  . Diabetes mellitus     controlled  . Gout   . S/P emergency tracheotomy for assistance in breathing     at age 74  . Pneumonia     Past Surgical History  Procedure Laterality Date  . Gastric bypass  2005    600 lbs prior to surgery  . Cholecystectomy    . Tonsillectomy    . Finger surgery      ulnar digital nerve and artery right index finger  . Cystoscopy      retrograde and double J catheter  insertion.    History   Social History  . Marital Status: Married    Spouse Name: N/A    Number of Children: N/A  . Years of Education: N/A   Occupational History  . Not on file.   Social History Main Topics  . Smoking status: Never Smoker   . Smokeless tobacco: Not on file  . Alcohol Use: No  . Drug Use: No     Comment: prior cocaine abuse (heavy) in his 74s.  denies subsequent use  . Sexual Activity: Yes    Partners: Female    Copy: None     Comment: on full disability, separated, no regular exercise,walks some, drinks pot of coffee a day.   Other Topics Concern  . Not on file   Social History Narrative   Drinks one pot of coffee daily.   Regular exercise-no, walks some      Lives in [place] with roommate.   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    Family History  Problem Relation Age of Onset  . Cancer Mother     lung, heavy smoker  . Hypertension Mother   . Hyperlipidemia Mother   .  Cancer Father     melanoma  . Aneurysm Father     cardiac  . Hyperlipidemia Father   . Hypertension Father   . Hyperlipidemia Sister   . Hypertension Sister   . Hyperlipidemia Brother   . Hypertension Brother   . Stroke Other     Outpatient Encounter Prescriptions as of 12/21/2012  Medication Sig Dispense Refill  . allopurinol (ZYLOPRIM) 100 MG tablet TAKE 1 TABLET EVERY DAY  30 tablet  6  . AMBULATORY NON FORMULARY MEDICATION Cyclobenzaprine 2% gabapentin 3% Baclofen 2%  Diclofenac 4% Qty# 120 ml Sig: apply small amount topically to effected area 2-3 times a day as directed 2 refills      . Blood Glucose Monitoring Suppl (BLOOD GLUCOSE METER) kit by Other route. Use as instructed and test BS daily.       Marland Kitchen diltiazem (CARDIZEM CD) 240 MG 24 hr capsule Take 1 capsule (240 mg total) by mouth daily.  90 capsule  3  . escitalopram (LEXAPRO) 10 MG tablet Take 20 mg QAM, 10 mg at 4 PM, 10 mg at 12 AM.  120 tablet  1  .  flecainide (TAMBOCOR) 100 MG tablet Take 1 tablet (100 mg total) by mouth 2 (two) times daily.  180 tablet  3  . GLUCOSAMINE-CHONDROITIN PO Take 1 tablet by mouth daily.      Marland Kitchen HYDROcodone-acetaminophen (NORCO) 10-325 MG per tablet Take 1 tablet by mouth every 8 (eight) hours as needed for pain.  90 tablet  0  . lisinopril (PRINIVIL,ZESTRIL) 10 MG tablet 1/2 tab po qd      . LORazepam (ATIVAN) 1 MG tablet TAKE ONE TABLET BY MOUTH EVERY 8 HOURS  90 tablet  0  . metFORMIN (GLUCOPHAGE) 1000 MG tablet Take 1,000 mg by mouth as needed.       . NON FORMULARY Joint Juice 1 daily      . Rivaroxaban (XARELTO) 15 MG TABS tablet Take 1 tablet (15 mg total) by mouth daily.  30 tablet  12  . Testosterone 10 MG/ACT (2%) GEL Place 10 mg onto the skin daily. Apply 1mL daily on to skin. Recheck in 12 weeks testosterone.  60 g  3  . zolpidem (AMBIEN) 5 MG tablet Take 1 tablet (5 mg total) by mouth at bedtime as needed for sleep.  30 tablet  0   No facility-administered encounter medications on file as of 12/21/2012.          Objective:   Physical Exam  Constitutional: He is oriented to person, place, and time. He appears well-developed and well-nourished.  Morbidly obese  HENT:  Head: Normocephalic and atraumatic.  Right Ear: External ear normal.  Left Ear: External ear normal.  Nose: Nose normal.  Mouth/Throat: Oropharynx is clear and moist.  Right nares wh dried clot and some fresh blood.   Eyes: Conjunctivae and EOM are normal. Pupils are equal, round, and reactive to light.  Neck: Normal range of motion. Neck supple. No thyromegaly present.  Cardiovascular: Normal rate, regular rhythm, normal heart sounds and intact distal pulses.   Pulmonary/Chest: Effort normal and breath sounds normal.  Abdominal: Soft. Bowel sounds are normal. He exhibits no distension and no mass. There is no tenderness. There is no rebound and no guarding.  Musculoskeletal: Normal range of motion. He exhibits no edema.   Lymphadenopathy:    He has no cervical adenopathy.  Neurological: He is alert and oriented to person, place, and time. He has normal reflexes.  Skin:  Skin is warm and dry.  Psychiatric: He has a normal mood and affect. His behavior is normal. Judgment and thought content normal.          Assessment & Plan:  CPE- Keep up a regular exercise program and make sure you are eating a healthy diet Try to eat 4 servings of dairy a day, or if you are lactose intolerant take a calcium with vitamin D daily.  Your vaccines are up to date.   DM- Well controlled.  Okay to decrease metformin to 500 mg daily. Continue to work on diet exercise and weight loss.. F/U in 4 months.   Lab Results  Component Value Date   HGBA1C 6.1 12/21/2012    Morbid obesity-he did start to see Dr. Seymour Bars at the bariatric clinic hear in [place]. He took the medication sporadically and then tried to call for refills and he can take it consistently for 90 days and she was out on maternity leave. He says had difficulty getting in touch with someone at the office there. He was hoping that we could help with this.  He was given Belviq    Colon cancer screening-we discussed different options for colon cancer screening. He is okay with moving forward with a colonoscopy. He lives in [place] now. Will refer to [facility].  Hypogonadism-we will recheck his testosterone levels and PSA today. If everything looks adequate then we'll switch him to AndroGel. He says he is still feeling a little tired and symptomatic and would like an increase on his dose if possible. I discussed with him that this is also based on his serum levels and we can make adjustments want to see his blood work.  Sleep apnea-will put in a new referral to eye 3-4 CPAP supplies and will add a humidifier. This may help with the nosebleeds or nasal ulcers that he's been experiencing.  Nasal ulcer-he does have evidence of nasal ulcer on the right side  today. We'll treat with Bactroban ointment. Also placed ENT referral. I did not see any abnormalities on his ear exam.

## 2012-12-23 ENCOUNTER — Other Ambulatory Visit: Payer: Self-pay | Admitting: Family Medicine

## 2012-12-23 ENCOUNTER — Ambulatory Visit (INDEPENDENT_AMBULATORY_CARE_PROVIDER_SITE_OTHER): Payer: BC Managed Care – PPO | Admitting: Psychiatry

## 2012-12-23 ENCOUNTER — Encounter (HOSPITAL_COMMUNITY): Payer: Self-pay | Admitting: Psychiatry

## 2012-12-23 VITALS — BP 113/70 | HR 72 | Ht 68.0 in | Wt 393.0 lb

## 2012-12-23 DIAGNOSIS — F1921 Other psychoactive substance dependence, in remission: Secondary | ICD-10-CM

## 2012-12-23 DIAGNOSIS — F332 Major depressive disorder, recurrent severe without psychotic features: Secondary | ICD-10-CM

## 2012-12-23 DIAGNOSIS — F329 Major depressive disorder, single episode, unspecified: Secondary | ICD-10-CM

## 2012-12-23 LAB — CBC WITH DIFFERENTIAL/PLATELET
Basophils Absolute: 0 10*3/uL (ref 0.0–0.1)
Basophils Relative: 1 % (ref 0–1)
Eosinophils Absolute: 0.1 10*3/uL (ref 0.0–0.7)
Eosinophils Relative: 1 % (ref 0–5)
HCT: 36.7 % — ABNORMAL LOW (ref 39.0–52.0)
Hemoglobin: 12 g/dL — ABNORMAL LOW (ref 13.0–17.0)
Lymphocytes Relative: 25 % (ref 12–46)
Lymphs Abs: 1.6 10*3/uL (ref 0.7–4.0)
MCH: 26 pg (ref 26.0–34.0)
MCHC: 32.7 g/dL (ref 30.0–36.0)
MCV: 79.6 fL (ref 78.0–100.0)
Monocytes Absolute: 0.7 10*3/uL (ref 0.1–1.0)
Monocytes Relative: 11 % (ref 3–12)
Neutro Abs: 3.9 10*3/uL (ref 1.7–7.7)
Neutrophils Relative %: 62 % (ref 43–77)
Platelets: 195 10*3/uL (ref 150–400)
RBC: 4.61 MIL/uL (ref 4.22–5.81)
RDW: 15.3 % (ref 11.5–15.5)
WBC: 6.3 10*3/uL (ref 4.0–10.5)

## 2012-12-23 LAB — COMPREHENSIVE METABOLIC PANEL
ALT: 19 U/L (ref 0–53)
AST: 17 U/L (ref 0–37)
Albumin: 3.9 g/dL (ref 3.5–5.2)
Alkaline Phosphatase: 74 U/L (ref 39–117)
BUN: 20 mg/dL (ref 6–23)
CO2: 24 mEq/L (ref 19–32)
Calcium: 8.7 mg/dL (ref 8.4–10.5)
Chloride: 105 mEq/L (ref 96–112)
Creat: 1.28 mg/dL (ref 0.50–1.35)
Glucose, Bld: 116 mg/dL — ABNORMAL HIGH (ref 70–99)
Potassium: 4.4 mEq/L (ref 3.5–5.3)
Sodium: 136 mEq/L (ref 135–145)
Total Bilirubin: 0.4 mg/dL (ref 0.3–1.2)
Total Protein: 6.4 g/dL (ref 6.0–8.3)

## 2012-12-23 LAB — LIPID PANEL
Cholesterol: 134 mg/dL (ref 0–200)
HDL: 35 mg/dL — ABNORMAL LOW (ref >39)
LDL Cholesterol: 69 mg/dL (ref 0–99)
Total CHOL/HDL Ratio: 3.8 Ratio
Triglycerides: 150 mg/dL — ABNORMAL HIGH (ref <150)
VLDL: 30 mg/dL (ref 0–40)

## 2012-12-23 LAB — PSA: PSA: 0.89 ng/mL (ref <=4.00)

## 2012-12-23 MED ORDER — ESCITALOPRAM OXALATE 10 MG PO TABS: ORAL_TABLET | ORAL | Status: DC
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
## 2012-12-24 ENCOUNTER — Other Ambulatory Visit: Payer: Self-pay | Admitting: Family Medicine

## 2012-12-24 LAB — TESTOSTERONE, FREE, TOTAL, SHBG
Sex Hormone Binding: 32 nmol/L (ref 13–71)
Testosterone, Free: 18.1 pg/mL — ABNORMAL LOW (ref 47.0–244.0)
Testosterone-% Free: 1.9 % (ref 1.6–2.9)
Testosterone: 97 ng/dL — ABNORMAL LOW (ref 300–890)

## 2012-12-24 LAB — FERRITIN: Ferritin: 7 ng/mL — ABNORMAL LOW (ref 22–322)

## 2012-12-24 MED ORDER — TESTOSTERONE 20.25 MG/1.25GM (1.62%) TD GEL: 4.0000 | Freq: Every morning | TRANSDERMAL | Status: DC

## 2012-12-25 ENCOUNTER — Other Ambulatory Visit: Payer: Self-pay | Admitting: *Deleted

## 2012-12-25 ENCOUNTER — Encounter (HOSPITAL_COMMUNITY): Payer: Self-pay | Admitting: Psychiatry

## 2012-12-28 ENCOUNTER — Ambulatory Visit (INDEPENDENT_AMBULATORY_CARE_PROVIDER_SITE_OTHER): Payer: BC Managed Care – PPO | Admitting: Behavioral Health

## 2012-12-28 DIAGNOSIS — F331 Major depressive disorder, recurrent, moderate: Secondary | ICD-10-CM

## 2012-12-29 ENCOUNTER — Encounter (HOSPITAL_COMMUNITY): Payer: Self-pay | Admitting: Behavioral Health

## 2012-12-29 NOTE — Progress Notes (Signed)
   THERAPIST PROGRESS NOTE  Session Time: 2:00  Participation Level: Active  Behavioral Response: CasualAlertDepressed  Type of Therapy: Individual Therapy  Treatment Goals addressed: Coping  Interventions: CBT  Summary: Rodney Bright is a 52 y.o. male who presents with depression.   Suicidal/Homicidal: Nowithout intent/plan  Therapist Response: The client indicates that he is in a bland place. As in the previous session he reports that he is neither content nor discontented with his life. He reports that he knows that there are things that he would like to be doing that he is not doing such as returning to school. He continues to feel that completing his degree whether he uses it would bring some closure to his life. We did spend a significant amount of the time looking at ways that the client to do things differently to bring some pleasure to his life. As we processed it more it turns out that the client loves cooking not just for himself loves the fact that people take pleasure in the food that he prepares. He is currently making salsa and giving some of that away and finds great benefit from that. I suggested that he find ways to use his  culinary skills to not only to reach out to other socially but to look at the benefits he came from doing good for others and using his talents.. He does contract for safety having no thoughts of hurting himself or anyone else.  Plan: Return again in 4 weeks.  Diagnosis: Axis I: 296.32    Axis II: Deferred    French Ana, [facility] 12/29/2012

## 2013-01-08 ENCOUNTER — Telehealth: Payer: Self-pay | Admitting: Cardiology

## 2013-01-08 NOTE — Telephone Encounter (Signed)
New Problem  Pt is having surgery in 2 weeks for a colonoscopy needs to sop Xarelto.  Please advise.

## 2013-01-08 NOTE — Telephone Encounter (Signed)
Spoke with pt, per dr Jens Som the pt is okay to stop the xarelto for 2 days prior to the procedure.

## 2013-01-11 ENCOUNTER — Ambulatory Visit (INDEPENDENT_AMBULATORY_CARE_PROVIDER_SITE_OTHER): Payer: BC Managed Care – PPO | Admitting: Behavioral Health

## 2013-01-11 DIAGNOSIS — F331 Major depressive disorder, recurrent, moderate: Secondary | ICD-10-CM

## 2013-01-12 ENCOUNTER — Encounter (HOSPITAL_COMMUNITY): Payer: Self-pay | Admitting: Behavioral Health

## 2013-01-12 NOTE — Progress Notes (Signed)
   THERAPIST PROGRESS NOTE  Session Time: 2:00  Participation Level: Active  Behavioral Response: CasualAlertDepressed  Type of Therapy: Individual Therapy  Treatment Goals addressed: Coping  Interventions: CBT  Summary: ALPHONZO DEVERA is a 52 y.o. male who presents with depression.   Suicidal/Homicidal: Nowithout intent/plan  Therapist Response: The client reports that he feels his medications are as good as they have been a long time. He reports that since he is feeling better physically his depression is better. He did present today was some issues related to family dynamics with his siblings. We talked at length about aspects of family dynamics that he can and cannot control. We talked about letting go of those things he cannot control as they only have a negative effect on him. As we had in previous sessions talked about the importance of forgiveness and what that means to the client. The client does contract for safety having no thoughts of hurting himself or anyone else.  Plan: Return again in 4 weeks.  Diagnosis: Axis I: 296.23    Axis II: Deferred    French Ana, [facility] 01/12/2013

## 2013-01-25 ENCOUNTER — Other Ambulatory Visit: Payer: Self-pay | Admitting: Family Medicine

## 2013-01-25 ENCOUNTER — Ambulatory Visit (HOSPITAL_COMMUNITY): Payer: Self-pay | Admitting: Behavioral Health

## 2013-01-28 ENCOUNTER — Telehealth: Payer: Self-pay | Admitting: *Deleted

## 2013-01-28 MED ORDER — PREDNISONE 20 MG PO TABS: ORAL_TABLET | ORAL | Status: DC

## 2013-01-28 NOTE — Telephone Encounter (Signed)
Pt notified of rx. 

## 2013-01-28 NOTE — Telephone Encounter (Signed)
i will send over course of prednisone

## 2013-01-28 NOTE — Telephone Encounter (Signed)
Pt calls stating that he is having a gout breakthrough & wants to know if there is something you can send him for the breakthrough pain. He is using [facility] on [address].

## 2013-01-29 ENCOUNTER — Telehealth (HOSPITAL_COMMUNITY): Payer: Self-pay | Admitting: *Deleted

## 2013-01-29 MED ORDER — ESCITALOPRAM OXALATE 10 MG PO TABS: ORAL_TABLET | ORAL | Status: DC

## 2013-01-29 NOTE — Telephone Encounter (Signed)
Pt sts that he is almost out of his lexapro and will not have enough to make it through the weekend

## 2013-01-29 NOTE — Addendum Note (Signed)
Addended by: Elaina Pattee on: 01/29/2013 09:34 AM   Modules accepted: Orders

## 2013-02-01 ENCOUNTER — Ambulatory Visit (HOSPITAL_COMMUNITY): Payer: Self-pay | Admitting: Behavioral Health

## 2013-02-02 ENCOUNTER — Ambulatory Visit (HOSPITAL_COMMUNITY): Payer: Self-pay | Admitting: Behavioral Health

## 2013-02-15 ENCOUNTER — Ambulatory Visit (INDEPENDENT_AMBULATORY_CARE_PROVIDER_SITE_OTHER): Payer: BC Managed Care – PPO | Admitting: Behavioral Health

## 2013-02-15 ENCOUNTER — Telehealth (HOSPITAL_COMMUNITY): Payer: Self-pay

## 2013-02-15 DIAGNOSIS — F331 Major depressive disorder, recurrent, moderate: Secondary | ICD-10-CM

## 2013-02-15 NOTE — Telephone Encounter (Signed)
PT SAW Rodney Bright TODAY FOR THERAPY AND ASKED THAT YOU CALL HIM. NO REASON

## 2013-02-15 NOTE — Telephone Encounter (Signed)
Called patient.  He asked about increasing Lorazepam. He reports anxiety due to his social isolation from his family which he feels more of during the holidays and due to the fact that he is not in a relationship.  Will not prescribe this medication.

## 2013-02-16 ENCOUNTER — Encounter (HOSPITAL_COMMUNITY): Payer: Self-pay | Admitting: Behavioral Health

## 2013-02-16 NOTE — Progress Notes (Signed)
   THERAPIST PROGRESS NOTE  Session Time: 2:00  Participation Level: Active  Behavioral Response: CasualAlertDepressed  Type of Therapy: Individual Therapy  Treatment Goals addressed: Coping  Interventions: CBT  Summary: Rodney Bright is a 52 y.o. male who presents with depression.   Suicidal/Homicidal: Nowithout intent/plan  Therapist Response: The client presented with depression today indicating that he had spent significant amount of time talking about his relationship with his sister and his ex-wife. He continues to struggle with the fact that his ex-wife is living with his sister. He stated that while he and his wife are separated his sister came to him and asked if she could continue her friendship with his wife. He indicated that initially he said he thought it would be okay but the more he thought about it the more he felt like his sister was betraying him. He did tell his wife that choosing to continue her friendship with his ex-wife meant that she was choosing not to have a relationship with him. He stated that she chose to remain friends with his ex-wife and he has not spoken to her in 3 years.  We spent time allowing him to process his emotions connected to those relationships. As we had in previous sessions we continued to talk about what forgiveness means and reconciliation means.  The client was having some heart palpitations so he did appear to be more tired. He indicated these palpitations or not unusual but have become less frequent with the new medication. He indicates that he is fine as long as he is sitting down  so he promised to go home and sit down for the rest of the afternoon since he did not have to work. He does contract for safety saying he has no thoughts of hurting himself or anyone else.  Plan: Return again in 4 weeks.  Diagnosis: Axis I: 296.32    Axis II: Deferred    French Ana, [facility] 02/16/2013

## 2013-02-19 ENCOUNTER — Other Ambulatory Visit: Payer: Self-pay | Admitting: *Deleted

## 2013-02-19 ENCOUNTER — Telehealth (HOSPITAL_COMMUNITY): Payer: Self-pay

## 2013-02-19 MED ORDER — LORAZEPAM 1 MG PO TABS: ORAL_TABLET | ORAL | Status: DC

## 2013-02-19 NOTE — Telephone Encounter (Signed)
I do not prescribe this medication for this patient.  Will forward message to his PCP.

## 2013-02-19 NOTE — Telephone Encounter (Signed)
Will forward request for lorazepam PCP.

## 2013-02-19 NOTE — Telephone Encounter (Signed)
Patient requests refill of lorazepam. I do not prescribe this medication. Will send request to PCP's office.

## 2013-02-19 NOTE — Telephone Encounter (Signed)
Pt is due for med. Called in rx to pharmacy on file

## 2013-02-22 ENCOUNTER — Other Ambulatory Visit: Payer: Self-pay | Admitting: Family Medicine

## 2013-02-22 NOTE — Telephone Encounter (Signed)
Did we send this.  I don't see on his med list?

## 2013-02-22 NOTE — Telephone Encounter (Signed)
Yes I did call this med in. It looked like the last time he had it filled was 12/16/2012

## 2013-02-25 ENCOUNTER — Other Ambulatory Visit: Payer: Self-pay | Admitting: Family Medicine

## 2013-03-01 ENCOUNTER — Encounter (INDEPENDENT_AMBULATORY_CARE_PROVIDER_SITE_OTHER): Payer: Self-pay

## 2013-03-01 ENCOUNTER — Encounter (HOSPITAL_COMMUNITY): Payer: Self-pay | Admitting: Behavioral Health

## 2013-03-01 ENCOUNTER — Ambulatory Visit (INDEPENDENT_AMBULATORY_CARE_PROVIDER_SITE_OTHER): Payer: BC Managed Care – PPO | Admitting: Behavioral Health

## 2013-03-01 DIAGNOSIS — F331 Major depressive disorder, recurrent, moderate: Secondary | ICD-10-CM

## 2013-03-01 NOTE — Progress Notes (Signed)
   THERAPIST PROGRESS NOTE  Session Time: 11:00  Participation Level: Active  Behavioral Response: CasualAlertDepressed/anxious  Type of Therapy: Individual Therapy  Treatment Goals addressed: Coping  Interventions: CBT  Summary: Rodney Bright is a 52 y.o. male who presents with anxiety and depression.   Suicidal/Homicidal: Nowithout intent/plan  Therapist Response: The client presents with moderate anxiety and depression. He indicated in the previous session that he feels he is triggered by seasonal changes in his depression saying that the holidays with his family history and the lack of natural light contribute to his depression. He does contract for safety saying he has no thoughts of hurting himself or anyone else. We talked at length about why therapy and coping skills for his depression and anxiety.  He indicated that he had not given as much thought of the past 2 weeks to the discussion we had in the previous session about his relationship with his sister brother. He did say that at this point time he has decided not to attempt a reconciliation with him. We talked about if he chose not to reconcile what it means to be able to let go of bad feelings toward them in order to benefit his mental and emotional health. The client continues to struggle with that but recognizes the need for letting go even if he does not reconcile with family members.  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 He indicates that he is only had preliminary conversations with the owner but is hopeful that this may be a possibility. He would create better pay as well as a more manageable schedule which he feels will help with his anxiety and depression.  Plan: Return again in 3 weeks.  Diagnosis: Axis I: 296.32    Axis II: Deferred    French Ana, [facility] 03/01/2013

## 2013-03-10 ENCOUNTER — Telehealth: Payer: Self-pay

## 2013-03-10 MED ORDER — ZOLPIDEM TARTRATE 5 MG PO TABS: 5.0000 mg | ORAL_TABLET | Freq: Every evening | ORAL | Status: DC | PRN

## 2013-03-12 NOTE — Telephone Encounter (Signed)
Refill sent.

## 2013-03-15 ENCOUNTER — Ambulatory Visit (HOSPITAL_COMMUNITY): Payer: Self-pay | Admitting: Behavioral Health

## 2013-03-24 ENCOUNTER — Encounter (HOSPITAL_COMMUNITY): Payer: Self-pay | Admitting: Psychiatry

## 2013-03-24 ENCOUNTER — Ambulatory Visit (INDEPENDENT_AMBULATORY_CARE_PROVIDER_SITE_OTHER): Payer: BC Managed Care – PPO | Admitting: Psychiatry

## 2013-03-24 ENCOUNTER — Telehealth: Payer: Self-pay | Admitting: *Deleted

## 2013-03-24 VITALS — BP 130/96 | HR 81 | Ht 68.0 in | Wt >= 6400 oz

## 2013-03-24 DIAGNOSIS — F1921 Other psychoactive substance dependence, in remission: Secondary | ICD-10-CM

## 2013-03-24 DIAGNOSIS — F332 Major depressive disorder, recurrent severe without psychotic features: Secondary | ICD-10-CM

## 2013-03-24 DIAGNOSIS — F329 Major depressive disorder, single episode, unspecified: Secondary | ICD-10-CM

## 2013-03-24 MED ORDER — ESCITALOPRAM OXALATE 10 MG PO TABS: 10.0000 mg | ORAL_TABLET | ORAL | Status: DC

## 2013-03-24 MED ORDER — ESCITALOPRAM OXALATE 10 MG PO TABS: 10.0000 mg | ORAL_TABLET | Freq: Four times a day (QID) | ORAL | Status: DC

## 2013-03-24 NOTE — Telephone Encounter (Signed)
Spoke with pt, he had left a message st the [place] office for me to call him. He wanted to let us know he is having minor episodes of atrial fib. They last from 15 min to one hour. He took and extra flecainide last night. He does feel different in the chest area when in atrial fib. The epiosdes are shorter than they were before. He wanted to know if he needed to make any changes in his meds. Will forward for dr Jens Som review
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
## 2013-03-24 NOTE — Telephone Encounter (Signed)
No change for now Rodney Bright

## 2013-03-25 NOTE — Telephone Encounter (Signed)
Spoke with pt, Aware of dr crenshaw's recommendations.  °

## 2013-03-29 ENCOUNTER — Ambulatory Visit (HOSPITAL_COMMUNITY): Payer: Self-pay | Admitting: Behavioral Health

## 2013-03-29 ENCOUNTER — Other Ambulatory Visit: Payer: Self-pay | Admitting: *Deleted

## 2013-03-29 MED ORDER — TESTOSTERONE 20.25 MG/ACT (1.62%) TD GEL: TRANSDERMAL | Status: DC

## 2013-04-02 ENCOUNTER — Other Ambulatory Visit: Payer: Self-pay | Admitting: Family Medicine

## 2013-04-23 ENCOUNTER — Other Ambulatory Visit: Payer: Self-pay | Admitting: Family Medicine

## 2013-04-29 ENCOUNTER — Other Ambulatory Visit: Payer: Self-pay | Admitting: Physician Assistant

## 2013-05-04 ENCOUNTER — Encounter: Payer: Self-pay | Admitting: Family Medicine

## 2013-05-04 ENCOUNTER — Ambulatory Visit (INDEPENDENT_AMBULATORY_CARE_PROVIDER_SITE_OTHER): Payer: BC Managed Care – PPO | Admitting: Family Medicine

## 2013-05-04 VITALS — BP 124/73 | HR 77 | Temp 97.6°F | Wt >= 6400 oz

## 2013-05-04 DIAGNOSIS — R040 Epistaxis: Secondary | ICD-10-CM

## 2013-05-04 DIAGNOSIS — B9689 Other specified bacterial agents as the cause of diseases classified elsewhere: Secondary | ICD-10-CM

## 2013-05-04 DIAGNOSIS — J329 Chronic sinusitis, unspecified: Secondary | ICD-10-CM

## 2013-05-04 DIAGNOSIS — A499 Bacterial infection, unspecified: Secondary | ICD-10-CM

## 2013-05-04 MED ORDER — ANTIPYRINE-BENZOCAINE 5.4-1.4 % OT SOLN: OTIC | Status: AC

## 2013-05-04 MED ORDER — DOXYCYCLINE HYCLATE 100 MG PO TABS: ORAL_TABLET | ORAL | Status: AC

## 2013-05-04 NOTE — Progress Notes (Signed)
CC: Rodney Bright is a 53 y.o. male is here for Otalgia   Subjective: HPI:  Patient complains of left ear pain along with left facial pain localized beneath the left eye that has been worsening over the past 3 weeks. Present on a daily basis. He's been using some expired auralgan which slightly helps your discomfort however Nothing else particularly makes better or worse afrin has been being used which does not seem to be doing much.  Has had mild nasal congestion.  Denies fevers, chills, motor or sensory disturbances, hearing loss, dizziness, dysphasia nor cough or shortness of breath  Reports that over the past 3-6 months he has had intermittent nosebleeds from the right nose. States that the frequency of his seems to be worse when using heated air in his nightly CPAP machine.  Other than afirin he denies any other interventions, this does seem to help during times of nosebleeds. Bleeding stopped after second set of the above or pinching his nose. Denies bleeding or bruising abnormalities elsewhere on the body   Review Of Systems Outlined In HPI  Past Medical History  Diagnosis Date  . Paroxysmal atrial fibrillation   . Lymphadenopathy   . Testosterone deficiency   . Anemia   . Vitamin D deficiency   . ED (erectile dysfunction)   . Morbid obesity   . OSA (obstructive sleep apnea)     CPAP-17.5 cm water pressure  . Kidney stones     stent, lithotripsy  . Diabetes mellitus     controlled  . Gout   . S/P emergency tracheotomy for assistance in breathing     at age 31  . Pneumonia      Family History  Problem Relation Age of Onset  . Cancer Mother     lung, heavy smoker  . Hypertension Mother   . Hyperlipidemia Mother   . Cancer Father     melanoma  . Aneurysm Father     cardiac  . Hyperlipidemia Father   . Hypertension Father   . Hyperlipidemia Sister   . Hypertension Sister   . Hyperlipidemia Brother   . Hypertension Brother   . Stroke Other      History   Substance Use Topics  . Smoking status: Never Smoker   . Smokeless tobacco: Not on file  . Alcohol Use: No     Objective: Filed Vitals:   05/04/13 1601  BP: 124/73  Pulse: 77  Temp: 97.6 F (36.4 C)    General: Alert and Oriented, No Acute Distress HEENT: Pupils equal, round, reactive to light. Conjunctivae clear.  External ears unremarkable, canals clear with intact TMs with appropriate landmarks.  Right Middle ear appears open without effusion left middle ear has mild serous effusion. Pink inferior turbinates.  Moist mucous membranes, pharynx without inflammation nor lesions however mild cobblestoning and postnasal drip.  Neck supple without palpable lymphadenopathy nor abnormal masses. Lungs: Clear to auscultation bilaterally, no wheezing/ronchi/rales.  Comfortable work of breathing. Good air movement. Mental Status: No depression, anxiety, nor agitation. Skin: Warm and dry.  Assessment & Plan: Gavynn was seen today for otalgia.  Diagnoses and associated orders for this visit:  Epistaxis - Ambulatory referral to ENT  Bacterial sinusitis - doxycycline (VIBRA-TABS) 100 MG tablet; One by mouth twice a day for ten days.  Other Orders - antipyrine-benzocaine (AURALGAN) otic solution; Two to four drops in affected ear(s) every 1-2 hours only as needed for pain.    Epistaxis: Referring to ENT for fiberoptic visualization  of nasal passage determine if cauterization as needed Nitroglycerin sinusitis: Start doxycycline consider Alka-Seltzer cold and sinus and nasal saline washes as needed  Return if symptoms worsen or fail to improve.

## 2013-05-05 ENCOUNTER — Other Ambulatory Visit: Payer: Self-pay | Admitting: Family Medicine

## 2013-05-13 ENCOUNTER — Telehealth: Payer: Self-pay | Admitting: Cardiology

## 2013-05-13 NOTE — Telephone Encounter (Signed)
New message    [facility]  He want to talk to the nurse and give her medication changes, etc

## 2013-05-13 NOTE — Telephone Encounter (Signed)
Spoke with pt, he [facility] His flecainide was stopped and he was started on amiodarone. He wonders what the next step is. Discussed with dr Jens Somcrenshaw, pt will need an appt to be seen. Left message for pt to call to schedule.

## 2013-05-14 NOTE — Telephone Encounter (Signed)
Follow up scheduled

## 2013-05-17 ENCOUNTER — Encounter: Payer: Self-pay | Admitting: Family Medicine

## 2013-05-21 ENCOUNTER — Telehealth: Payer: Self-pay | Admitting: *Deleted

## 2013-05-21 MED ORDER — AMIODARONE HCL 400 MG PO TABS: 400.0000 mg | ORAL_TABLET | Freq: Two times a day (BID) | ORAL | Status: DC

## 2013-05-21 NOTE — Telephone Encounter (Signed)
Pt called and informed that he was in the hospital and was given Amiodarone 400 BID and taken off of Flecainide 100. He reports that the rx was written for him to go every week to have get rx he would like to get 30 day until he is seen by Dr. Linford ArnoldMetheney.[name], Joeanna Howdyshell [place]

## 2013-05-24 ENCOUNTER — Encounter: Payer: Self-pay | Admitting: Family Medicine

## 2013-06-01 ENCOUNTER — Other Ambulatory Visit: Payer: Self-pay | Admitting: Family Medicine

## 2013-06-02 ENCOUNTER — Ambulatory Visit (INDEPENDENT_AMBULATORY_CARE_PROVIDER_SITE_OTHER): Payer: BC Managed Care – PPO | Admitting: Cardiology

## 2013-06-02 ENCOUNTER — Encounter: Payer: Self-pay | Admitting: Cardiology

## 2013-06-02 VITALS — BP 118/80 | HR 77 | Ht 69.0 in | Wt 376.0 lb

## 2013-06-02 DIAGNOSIS — I4891 Unspecified atrial fibrillation: Secondary | ICD-10-CM

## 2013-06-02 DIAGNOSIS — I10 Essential (primary) hypertension: Secondary | ICD-10-CM

## 2013-06-02 MED ORDER — AMIODARONE HCL 400 MG PO TABS: 200.0000 mg | ORAL_TABLET | Freq: Every day | ORAL | Status: DC

## 2013-06-02 NOTE — Assessment & Plan Note (Signed)
Need weight loss.

## 2013-06-02 NOTE — Assessment & Plan Note (Signed)
Patient is in sinus rhythm today. [facility] and amiodarone initiated. Decrease amiodarone to 200 mg daily and continue xeralto (embolic risk factors DM and HTN). He would like to be considered for ablation. I am not convinced he would be a good candidate due to size. He would like to discuss this with Dr. Johney FrameAllred and see if he is a candidate and if he needs to lose weight how much with that be. We will arrange an appointment. [facility]btain records [facility] concerning recent hospitalization.

## 2013-06-02 NOTE — Assessment & Plan Note (Signed)
Blood pressure controlled. Continue present medications. 

## 2013-06-02 NOTE — Patient Instructions (Signed)
Your physician recommends that you schedule a follow-up appointment in: 3 MONTHS WITH DR Jens SomRENSHAW  Your physician recommends that you schedule a follow-up appointment in: DR ALLRED  DECREASE AMIODARONE TO 200 MG ONCE DAILY

## 2013-06-02 NOTE — Progress Notes (Signed)
HPI: FU atrial fibrillation. Previously presented to [facility] with atrial fibrillation. Based on outside records LV function was normal. Stress echocardiogram in March of 2012 showed no ischemia. Echocardiogram in March of 2014 showed an ejection fraction of 40-45%, mild left atrial enlargement and grade 2 diastolic dysfunction. Study was technically difficult. Nuclear study in April of 2014 at [facility] showed an ejection fraction greater than 65% and normal perfusion. Patient had an outpatient monitor for recurrent palpitations which revealed wide-complex tachycardia. The patient seen by Dr. Rayann Heman and this was felt to be atrial flutter with one-to-one conduction. He was started on flecainide. followup exercise treadmill showed no induced ventricular arrhythmias but there was a related left bundle branch block. Patient recently hospitalized at [facility] and flecanide DCed and amiodarone initiated. No records available. Prior to that admission he was having increasing frequency of atrial fibrillation on flecainide. He did have chest pain on the day she had a prolonged episode. He has not had atrial fibrillation since discharge. He has some dyspnea on exertion but no orthopnea or PND. No exertional chest pain.   Current Outpatient Prescriptions  Medication Sig Dispense Refill  . allopurinol (ZYLOPRIM) 100 MG tablet TAKE 1 TABLET EVERY DAY  30 tablet  6  . AMBULATORY NON FORMULARY MEDICATION Cyclobenzaprine 2% gabapentin 3% Baclofen 2%  Diclofenac 4% Qty# 120 ml Sig: apply small amount topically to effected area 2-3 times a day as directed 2 refills      . amiodarone (PACERONE) 400 MG tablet Take 1 tablet (400 mg total) by mouth 2 (two) times daily.  60 tablet  0  . ANDROGEL PUMP 20.25 MG/ACT (1.62%) GEL PLACE 4 PUMPS ON SKIN EVERY MORNING  150 g  0  . Blood Glucose Monitoring Suppl (BLOOD GLUCOSE METER) kit by Other route. Use as instructed and test BS daily.       Marland Kitchen diltiazem  (CARDIZEM CD) 240 MG 24 hr capsule Take 1 capsule (240 mg total) by mouth daily.  90 capsule  3  . escitalopram (LEXAPRO) 10 MG tablet Take 1 tablet (10 mg total) by mouth as directed. Take 20 mg QAM, 10 mg at 4 PM, 10 mg at 12 AM.  120 tablet  2  . GLUCOSAMINE-CHONDROITIN PO Take 1 tablet by mouth daily.      Marland Kitchen HYDROcodone-acetaminophen (NORCO) 10-325 MG per tablet Take 1 tablet by mouth every 8 (eight) hours as needed for pain.  90 tablet  0  . lisinopril (PRINIVIL,ZESTRIL) 10 MG tablet 1/2 tab po qd      . LORazepam (ATIVAN) 1 MG tablet TAKE 1 TABLET BY MOUTH EVERY 8 HOURS  90 tablet  0  . metFORMIN (GLUCOPHAGE) 500 MG tablet TAKE 1 TABLET (500 MG TOTAL) BY MOUTH AS NEEDED.  30 tablet  2  . mupirocin ointment (BACTROBAN) 2 % Apply topically daily.  22 g  0  . NON FORMULARY Joint Juice 1 daily      . Rivaroxaban (XARELTO) 15 MG TABS tablet Take 1 tablet (15 mg total) by mouth daily.  30 tablet  12  . zolpidem (AMBIEN) 5 MG tablet TAKE 1 TABLET BY MOUTH AT BEDTIME AS NEEDED FOR SLEEP  30 tablet  0   No current facility-administered medications for this visit.     Past Medical History  Diagnosis Date  . Paroxysmal atrial fibrillation   . Lymphadenopathy   . Testosterone deficiency   . Anemia   . Vitamin D deficiency   .  ED (erectile dysfunction)   . Morbid obesity   . OSA (obstructive sleep apnea)     CPAP-17.5 cm water pressure  . Kidney stones     stent, lithotripsy  . Diabetes mellitus     controlled  . Gout   . S/P emergency tracheotomy for assistance in breathing     at age 36  . Pneumonia     Past Surgical History  Procedure Laterality Date  . Gastric bypass  2005    600 lbs prior to surgery  . Cholecystectomy    . Tonsillectomy    . Finger surgery      ulnar digital nerve and artery right index finger  . Cystoscopy      retrograde and double J catheter insertion.    History   Social History  . Marital Status: Married    Spouse Name: N/A    Number of  Children: N/A  . Years of Education: N/A   Occupational History  . Not on file.   Social History Main Topics  . Smoking status: Never Smoker   . Smokeless tobacco: Not on file  . Alcohol Use: No  . Drug Use: No     Comment: prior cocaine abuse (heavy) in his 57s.  denies subsequent use  . Sexual Activity: Yes    Partners: Female    Museum/gallery curator: None     Comment: on full disability, separated, no regular exercise,walks some, drinks pot of coffee a day.   Other Topics Concern  . Not on file   Social History Narrative   Drinks one pot of coffee daily.   Regular exercise-no, walks some      Lives in [place] with roommate.   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    ROS: no fevers or chills, productive cough, hemoptysis, dysphasia, odynophagia, melena, hematochezia, dysuria, hematuria, rash, seizure activity, orthopnea, PND, pedal edema, claudication. Remaining systems are negative.  Physical Exam: Well-developed morbidly obese in no acute distress.  Skin is warm and dry.  HEENT is normal.  Neck is supple.  Chest is clear to auscultation with normal expansion.  Cardiovascular exam is regular rate and rhythm.  Abdominal exam nontender or distended. No masses palpated. Extremities show no edema. neuro grossly intact  ECG sinus rhythm at a rate of 77. Left anterior fascicular block, cannot rule out prior septal infarct, low voltage

## 2013-06-07 ENCOUNTER — Other Ambulatory Visit: Payer: Self-pay | Admitting: Family Medicine

## 2013-06-14 ENCOUNTER — Other Ambulatory Visit: Payer: Self-pay | Admitting: Family Medicine

## 2013-06-14 ENCOUNTER — Encounter: Payer: Self-pay | Admitting: Family Medicine

## 2013-06-22 ENCOUNTER — Other Ambulatory Visit: Payer: Self-pay | Admitting: *Deleted

## 2013-06-22 ENCOUNTER — Ambulatory Visit (INDEPENDENT_AMBULATORY_CARE_PROVIDER_SITE_OTHER): Payer: BC Managed Care – PPO | Admitting: Psychiatry

## 2013-06-22 ENCOUNTER — Encounter (HOSPITAL_COMMUNITY): Payer: Self-pay | Admitting: Psychiatry

## 2013-06-22 ENCOUNTER — Encounter (INDEPENDENT_AMBULATORY_CARE_PROVIDER_SITE_OTHER): Payer: Self-pay

## 2013-06-22 VITALS — BP 129/71 | HR 61 | Wt >= 6400 oz

## 2013-06-22 DIAGNOSIS — Z0000 Encounter for general adult medical examination without abnormal findings: Secondary | ICD-10-CM

## 2013-06-22 DIAGNOSIS — F332 Major depressive disorder, recurrent severe without psychotic features: Secondary | ICD-10-CM

## 2013-06-22 DIAGNOSIS — F329 Major depressive disorder, single episode, unspecified: Secondary | ICD-10-CM

## 2013-06-22 DIAGNOSIS — F1921 Other psychoactive substance dependence, in remission: Secondary | ICD-10-CM

## 2013-06-22 LAB — LIPID PANEL
Cholesterol: 153 mg/dL (ref 0–200)
HDL: 37 mg/dL — ABNORMAL LOW (ref >39)
LDL Cholesterol: 82 mg/dL (ref 0–99)
Total CHOL/HDL Ratio: 4.1 Ratio
Triglycerides: 168 mg/dL — ABNORMAL HIGH (ref <150)
VLDL: 34 mg/dL (ref 0–40)

## 2013-06-22 LAB — COMPLETE METABOLIC PANEL WITH GFR
ALT: 20 U/L (ref 0–53)
AST: 17 U/L (ref 0–37)
Albumin: 4.2 g/dL (ref 3.5–5.2)
Alkaline Phosphatase: 85 U/L (ref 39–117)
BUN: 16 mg/dL (ref 6–23)
CO2: 27 mEq/L (ref 19–32)
Calcium: 9.1 mg/dL (ref 8.4–10.5)
Chloride: 107 mEq/L (ref 96–112)
Creat: 1.25 mg/dL (ref 0.50–1.35)
GFR, Est African American: 75 mL/min
GFR, Est Non African American: 65 mL/min
Glucose, Bld: 124 mg/dL — ABNORMAL HIGH (ref 70–99)
Potassium: 4.6 mEq/L (ref 3.5–5.3)
Sodium: 140 mEq/L (ref 135–145)
Total Bilirubin: 0.5 mg/dL (ref 0.2–1.2)
Total Protein: 6.6 g/dL (ref 6.0–8.3)

## 2013-06-22 MED ORDER — ESCITALOPRAM OXALATE 10 MG PO TABS: 10.0000 mg | ORAL_TABLET | ORAL | Status: DC
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
## 2013-06-28 ENCOUNTER — Encounter: Payer: Self-pay | Admitting: Family Medicine

## 2013-06-28 ENCOUNTER — Ambulatory Visit (INDEPENDENT_AMBULATORY_CARE_PROVIDER_SITE_OTHER): Payer: BC Managed Care – PPO | Admitting: Family Medicine

## 2013-06-28 VITALS — BP 112/72 | HR 60 | Temp 98.3°F | Resp 18 | Wt >= 6400 oz

## 2013-06-28 DIAGNOSIS — Z23 Encounter for immunization: Secondary | ICD-10-CM

## 2013-06-28 DIAGNOSIS — E291 Testicular hypofunction: Secondary | ICD-10-CM

## 2013-06-28 DIAGNOSIS — I10 Essential (primary) hypertension: Secondary | ICD-10-CM

## 2013-06-28 DIAGNOSIS — Z1211 Encounter for screening for malignant neoplasm of colon: Secondary | ICD-10-CM

## 2013-06-28 DIAGNOSIS — Z0000 Encounter for general adult medical examination without abnormal findings: Secondary | ICD-10-CM

## 2013-06-28 DIAGNOSIS — I4891 Unspecified atrial fibrillation: Secondary | ICD-10-CM

## 2013-06-28 DIAGNOSIS — E669 Obesity, unspecified: Secondary | ICD-10-CM

## 2013-06-28 DIAGNOSIS — E119 Type 2 diabetes mellitus without complications: Secondary | ICD-10-CM

## 2013-06-28 LAB — POCT GLYCOSYLATED HEMOGLOBIN (HGB A1C): Hemoglobin A1C: 6.9

## 2013-06-28 MED ORDER — PRAVASTATIN SODIUM 40 MG PO TABS: 40.0000 mg | ORAL_TABLET | Freq: Every day | ORAL | Status: DC

## 2013-06-28 NOTE — Progress Notes (Signed)
Subjective:    Patient ID: Rodney Bright, male    DOB: [birth year], 53 y.o.   MRN: [id number]  HPI Here for CPE today.  No specific concerns today.  Atrial fibrillation-he's been having symptoms with his A. fib. They have recommended that he see cardiology for possible cardioversion. He does not seem to be maximally controlled on his medication regimen. He is Re: on amiodarone, Cardizem, Xarelto. His appointment is in a few weeks. He's been unable to exercise because of this. Unfortunately that has resulted in about a 30 pound weight gain in the last 6 months.  Iron deficiency anemia-he has been taking his iron for the last 4-5 months consistently. He's noticed some recent constipation. He is also following Dr. Loyal Gambler, at the bariatric clinic here and [facility], he recently restarted Belviq.  Diabetes - no hypoglycemic events. No wounds or sores that are not healing well. No increased thirst or urination. Checking glucose at home. Taking medications as prescribed without any side effects.  Hypogonadism-most likely secondary to his obesity. He is doing well on the AndroGel and is currently using 4 pumps. He was supposed to followup in October for repeat blood work but did not.   Review of Systems Comprehensive review of systems is negative. He did have some chest pain before starting the amiodarone for his atrial fibrillation but that has completely resolved on the amiodarone. Denies any shortness of breath. Not currently exercising.  BP 112/72  Pulse 60  Temp(Src) 98.3 F (36.8 C) (Oral)  Resp 18  Wt 400 lb 8 oz (181.666 kg)    Allergies  Allergen Reactions  . Colchicine Shortness Of Breath and Swelling    Past Medical History  Diagnosis Date  . Paroxysmal atrial fibrillation   . Lymphadenopathy   . Testosterone deficiency   . Anemia   . Vitamin D deficiency   . ED (erectile dysfunction)   . Morbid obesity   . OSA (obstructive sleep apnea)     CPAP-17.5 cm water  pressure  . Kidney stones     stent, lithotripsy  . Diabetes mellitus     controlled  . Gout   . S/P emergency tracheotomy for assistance in breathing     at age 33  . Pneumonia     Past Surgical History  Procedure Laterality Date  . Gastric bypass  2005    600 lbs prior to surgery  . Cholecystectomy    . Tonsillectomy    . Finger surgery      ulnar digital nerve and artery right index finger  . Cystoscopy      retrograde and double J catheter insertion.    History   Social History  . Marital Status: Married    Spouse Name: N/A    Number of Children: N/A  . Years of Education: N/A   Occupational History  . Not on file.   Social History Main Topics  . Smoking status: Never Smoker   . Smokeless tobacco: Not on file  . Alcohol Use: No  . Drug Use: No     Comment: prior cocaine abuse (heavy) in his 33s.  denies subsequent use  . Sexual Activity: Yes    Partners: Female    Museum/gallery curator: None     Comment: on full disability, separated, no regular exercise,walks some, drinks pot of coffee a day.   Other Topics Concern  . Not on file   Social History Narrative   Drinks one pot of coffee  daily.   Regular exercise-no, walks some      Lives in [place] with roommate.   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    Family History  Problem Relation Age of Onset  . Cancer Mother     lung, heavy smoker  . Hypertension Mother   . Hyperlipidemia Mother   . Cancer Father     melanoma  . Aneurysm Father     cardiac  . Hyperlipidemia Father   . Hypertension Father   . Hyperlipidemia Sister   . Hypertension Sister   . Hyperlipidemia Brother   . Hypertension Brother   . Stroke Other     Outpatient Encounter Prescriptions as of 06/28/2013  Medication Sig  . allopurinol (ZYLOPRIM) 100 MG tablet TAKE 1 TABLET BY MOUTH EVERY DAY  . AMBULATORY NON FORMULARY MEDICATION Cyclobenzaprine 2% gabapentin 3% Baclofen 2%  Diclofenac  4% Qty# 120 ml Sig: apply small amount topically to effected area 2-3 times a day as directed 2 refills  . amiodarone (PACERONE) 400 MG tablet Take 0.5 tablets (200 mg total) by mouth daily.  . ANDROGEL PUMP 20.25 MG/ACT (1.62%) GEL PLACE 4 PUMPS ON SKIN EVERY MORNING  . Blood Glucose Monitoring Suppl (BLOOD GLUCOSE METER) kit by Other route. Use as instructed and test BS daily.   Marland Kitchen diltiazem (CARDIZEM CD) 240 MG 24 hr capsule Take 1 capsule (240 mg total) by mouth daily.  Marland Kitchen diltiazem (DILACOR XR) 120 MG 24 hr capsule Take 120 mg by mouth.  . escitalopram (LEXAPRO) 10 MG tablet Take 1 tablet (10 mg total) by mouth as directed. Take 20 mg QAM, 10 mg at 4 PM, 10 mg at 12 AM.  . ferrous fumarate-iron polysaccharide complex (TANDEM) 162-115.2 MG CAPS Take 1 capsule by mouth.  Marland Kitchen GLUCOSAMINE-CHONDROITIN PO Take 1 tablet by mouth daily.  Marland Kitchen HYDROcodone-acetaminophen (NORCO) 10-325 MG per tablet Take 1 tablet by mouth every 8 (eight) hours as needed for pain.  Javier Docker Oil 300 MG CAPS Take 1 capsule by mouth.  Marland Kitchen lisinopril (PRINIVIL,ZESTRIL) 10 MG tablet 1/2 tab po qd  . LORazepam (ATIVAN) 1 MG tablet TAKE 1 TABLET BY MOUTH EVERY 8 HOURS  . Lorcaserin HCl (BELVIQ) 10 MG TABS Take 10 mg by mouth.  . metFORMIN (GLUCOPHAGE) 1000 MG tablet Take 1,000 mg by mouth.  . Multiple Vitamin (THERA) TABS Take 1 tablet by mouth.  . mupirocin ointment (BACTROBAN) 2 % Apply topically daily.  . NON FORMULARY Joint Juice 1 daily  . Rivaroxaban (XARELTO) 15 MG TABS tablet Take 1 tablet (15 mg total) by mouth daily.  . Vitamin D, Ergocalciferol, (DRISDOL) 50000 UNITS CAPS capsule Take 50,000 Units by mouth.  . zolpidem (AMBIEN) 5 MG tablet TAKE 1 TABLET BY MOUTH AT BEDTIME AS NEEDED FOR SLEEP  . pravastatin (PRAVACHOL) 40 MG tablet Take 1 tablet (40 mg total) by mouth at bedtime.  . [DISCONTINUED] metFORMIN (GLUCOPHAGE) 500 MG tablet TAKE 1 TABLET (500 MG TOTAL) BY MOUTH AS NEEDED.          Objective:   Physical  Exam  Constitutional: He is oriented to person, place, and time. He appears well-developed and well-nourished.  HENT:  Head: Normocephalic and atraumatic.  Right Ear: External ear normal.  Left Ear: External ear normal.  Nose: Nose normal.  Mouth/Throat: Oropharynx is clear and moist.  Eyes: Conjunctivae and EOM are normal. Pupils are equal, round, and reactive to light.  Neck: Normal range of motion. Neck supple.  No thyromegaly present.  Cardiovascular: Normal rate, regular rhythm, normal heart sounds and intact distal pulses.   No carotid or abominal bruits.   Pulmonary/Chest: Effort normal and breath sounds normal.  Abdominal: Soft. Bowel sounds are normal. He exhibits no distension and no mass. There is no tenderness. There is no rebound and no guarding.  Musculoskeletal: Normal range of motion.  Lymphadenopathy:    He has no cervical adenopathy.  Neurological: He is alert and oriented to person, place, and time. He has normal reflexes.  Skin: Skin is warm and dry.  Psychiatric: He has a normal mood and affect. His behavior is normal. Judgment and thought content normal.          Assessment & Plan:  CPE-  Keep up a regular exercise program and make sure you are eating a healthy diet Try to eat 4 servings of dairy a day, or if you are lactose intolerant take a calcium with vitamin D daily.  Your vaccines are up to date.  Reminded him he is due for his yearly eye exam. Also due for screening colonoscopy. He is okay with [place] place a referral. He prefers to be seen in [place] so will refer to [place]. Pneumonia vaccine given today. Prevnar 13 received. Will be due for pneumococcal 23 next year.  Morbid Obesity-continue work on diet and weight loss.  Diabetes-A1c looks great today. We also discussed the need for statin. Patient is okay with starting medication. He is also on an ACE inhibitor. Plans to schedule an eye exam this summer. He is okay with starting a statin. We'll  start pravastatin 40 mg.  Iron deficiency anemia-encouraged him to talk with Dr. Loyal Gambler about rechecking his iron levels. Last ferritin was 7, approximately 6 months ago. It sounds like she will do some blood work on him when he sees her tomorrow.   Constipation-most likely secondary to amiodarone. This is his newest medication and he started around the time the constipation started.   Hypogonadism-we adjusted his AndroGel to 4 pounds back in September. He was supposed to followup in one month for repeat blood work and did not. Thus we'll repeat testosterone and PSA.

## 2013-06-28 NOTE — Addendum Note (Signed)
Addended by: Deno EtienneBARKLEY, Haylea Schlichting L on: 06/28/2013 02:23 PM   Modules accepted: Orders

## 2013-06-28 NOTE — Patient Instructions (Addendum)
See if Dr. Cathey EndowBowen can recheck your iron levels.  Last ferritin was 7.   Remember to make an appointment with Dr. Marland McalpineGarbers office.   Keep up a regular exercise program and make sure you are eating a healthy diet Try to eat 4 servings of dairy a day, or if you are lactose intolerant take a calcium with vitamin D daily.  Your vaccines are up to date.

## 2013-06-30 ENCOUNTER — Other Ambulatory Visit: Payer: Self-pay | Admitting: Physician Assistant

## 2013-06-30 ENCOUNTER — Other Ambulatory Visit: Payer: Self-pay | Admitting: Family Medicine

## 2013-07-05 ENCOUNTER — Ambulatory Visit (INDEPENDENT_AMBULATORY_CARE_PROVIDER_SITE_OTHER): Payer: BC Managed Care – PPO | Admitting: Internal Medicine

## 2013-07-05 ENCOUNTER — Encounter: Payer: Self-pay | Admitting: Internal Medicine

## 2013-07-05 VITALS — BP 114/70 | HR 64 | Ht 69.0 in | Wt >= 6400 oz

## 2013-07-05 DIAGNOSIS — I4891 Unspecified atrial fibrillation: Secondary | ICD-10-CM

## 2013-07-05 DIAGNOSIS — G4730 Sleep apnea, unspecified: Secondary | ICD-10-CM

## 2013-07-05 MED ORDER — AMIODARONE HCL 400 MG PO TABS: 200.0000 mg | ORAL_TABLET | Freq: Every day | ORAL | Status: DC

## 2013-07-05 MED ORDER — RIVAROXABAN 20 MG PO TABS: 20.0000 mg | ORAL_TABLET | Freq: Every day | ORAL | Status: DC

## 2013-07-05 NOTE — Patient Instructions (Signed)
Your physician recommends that you schedule a follow-up appointment as needed with Dr Johney FrameAllred   Your physician has recommended you make the following change in your medication:  1) Increase Xarelto to 20mg  daily 2) Take Amiodarone 200mg  daily   Your physician recommends that you return for lab work in:  Have an Amiodarone level drawn 08/16/13

## 2013-07-06 ENCOUNTER — Other Ambulatory Visit: Payer: Self-pay | Admitting: *Deleted

## 2013-07-06 DIAGNOSIS — I4891 Unspecified atrial fibrillation: Secondary | ICD-10-CM

## 2013-07-06 DIAGNOSIS — Z79899 Other long term (current) drug therapy: Secondary | ICD-10-CM

## 2013-07-07 NOTE — Progress Notes (Signed)
Primary Care Physician: Nani GasserMETHENEY,CATHERINE, MD Referring Physician:  Dr Daleen Snookrenshaw   Rodney Bright is a 53 y.o. male with a h/o morbid obesity, DM, and paroxysmal atrial fibrillation who presents today for EP follow-up.  Since last being seen in our office, the patient has done well.  He failed medical therapy with flecainide and ended [facility]. Flecainide was switched to amiodarone at that time.  Episodic afib appears to be well controlled on amiodarone at this time. Today, he denies symptoms of chest pain, orthopnea, PND, syncope, or neurologic sequela.  + edema chronically.   The patient is tolerating medications without difficulties and is otherwise without complaint today.   Past Medical History  Diagnosis Date  . Paroxysmal atrial fibrillation   . Lymphadenopathy   . Testosterone deficiency   . Anemia   . Vitamin D deficiency   . ED (erectile dysfunction)   . Morbid obesity   . OSA (obstructive sleep apnea)     CPAP-17.5 cm water pressure  . Kidney stones     stent, lithotripsy  . Diabetes mellitus     controlled  . Gout   . S/P emergency tracheotomy for assistance in breathing     at age 89  . Pneumonia    Past Surgical History  Procedure Laterality Date  . Gastric bypass  2005    600 lbs prior to surgery  . Cholecystectomy    . Tonsillectomy    . Finger surgery      ulnar digital nerve and artery right index finger  . Cystoscopy      retrograde and double J catheter insertion.    Current Outpatient Prescriptions  Medication Sig Dispense Refill  . allopurinol (ZYLOPRIM) 100 MG tablet TAKE 1 TABLET BY MOUTH EVERY DAY  30 tablet  4  . AMBULATORY NON FORMULARY MEDICATION Cyclobenzaprine 2% gabapentin 3% Baclofen 2%  Diclofenac 4% Qty# 120 ml Sig: apply small amount topically to effected area 2-3 times a day as directed 2 refills      . amiodarone (PACERONE) 400 MG tablet Take 0.5 tablets (200 mg total) by mouth daily.   90 tablet  3  . ANDROGEL PUMP 20.25 MG/ACT (1.62%) GEL PLACE 4 PUMPS ON SKIN EVERY MORNING  150 g  0  . diltiazem (CARDIZEM CD) 240 MG 24 hr capsule Take 1 capsule (240 mg total) by mouth daily.  90 capsule  3  . escitalopram (LEXAPRO) 10 MG tablet Take 1 tablet (10 mg total) by mouth as directed. Take 20 mg QAM, 10 mg at 4 PM, 10 mg at 12 AM.  120 tablet  2  . ferrous fumarate-iron polysaccharide complex (TANDEM) 162-115.2 MG CAPS Take 1 capsule by mouth.      Marland Kitchen. GLUCOSAMINE-CHONDROITIN PO Take 1 tablet by mouth daily.      Marland Kitchen. HYDROcodone-acetaminophen (NORCO) 10-325 MG per tablet Take 1 tablet by mouth every 8 (eight) hours as needed for pain.  90 tablet  0  . Krill Oil 300 MG CAPS Take 1 capsule by mouth.      Marland Kitchen. lisinopril (PRINIVIL,ZESTRIL) 10 MG tablet 1/2 tab po qd      . LORazepam (ATIVAN) 1 MG tablet TAKE 1 TABLET BY MOUTH EVERY 8 HOURS  90 tablet  0  . Lorcaserin HCl (BELVIQ) 10 MG TABS Take 10 mg by mouth 2 (two) times daily.       . metFORMIN (GLUCOPHAGE) 1000 MG tablet Take 500 mg by mouth.       .Marland Kitchen  Multiple Vitamin (THERA) TABS Take 1 tablet by mouth.      . mupirocin ointment (BACTROBAN) 2 % Apply topically as needed.      . NON FORMULARY Joint Juice 1 daily      . Rivaroxaban (XARELTO) 20 MG TABS tablet Take 1 tablet (20 mg total) by mouth daily.  30 tablet  11  . Vitamin D, Ergocalciferol, (DRISDOL) 50000 UNITS CAPS capsule Take 50,000 Units by mouth daily.       Marland Kitchen zolpidem (AMBIEN) 5 MG tablet TAKE 1 TABLET BY MOUTH AT BEDTIME AS NEEDED SLEEP  30 tablet  0  . Oxycodone HCl 10 MG TABS Take 1 tablet by mouth daily.      . pravastatin (PRAVACHOL) 40 MG tablet Take 40 mg by mouth at bedtime. Patient has not started this yet (06/08/13)       No current facility-administered medications for this visit.    Allergies  Allergen Reactions  . Colchicine Shortness Of Breath and Swelling    History   Social History  . Marital Status: Married    Spouse Name: N/A    Number of  Children: N/A  . Years of Education: N/A   Occupational History  . Not on file.   Social History Main Topics  . Smoking status: Never Smoker   . Smokeless tobacco: Not on file  . Alcohol Use: No  . Drug Use: No     Comment: prior cocaine abuse (heavy) in his 67s.  denies subsequent use  . Sexual Activity: Yes    Partners: Female    Copy: None     Comment: on full disability, separated, no regular exercise,walks some, drinks pot of coffee a day.   Other Topics Concern  . Not on file   Social History Narrative   Drinks one pot of coffee daily.   Regular exercise-no, walks some      Lives in [place] with roommate.   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    Family History  Problem Relation Age of Onset  . Cancer Mother     lung, heavy smoker  . Hypertension Mother   . Hyperlipidemia Mother   . Cancer Father     melanoma  . Aneurysm Father     cardiac  . Hyperlipidemia Father   . Hypertension Father   . Hyperlipidemia Sister   . Hypertension Sister   . Hyperlipidemia Brother   . Hypertension Brother   . Stroke Other     ROS- All systems are reviewed and negative except as per the HPI above  Physical Exam: Filed Vitals:   07/05/13 0834  BP: 114/70  Pulse: 64  Height: 5\' 9"  (1.753 m)  Weight: 400 lb 12.8 oz (181.802 kg)    GEN- The patient is morbidly obese appearing, alert and oriented x 3 today.   Head- normocephalic, atraumatic Eyes-  Sclera clear, conjunctiva pink Ears- hearing intact Oropharynx- clear Neck- supple  Lungs- Clear to ausculation bilaterally, normal work of breathing Heart- Regular rate and rhythm, no murmurs, rubs or gallops, PMI not laterally displaced GI- soft, NT, ND, + BS Extremities- no clubbing, cyanosis, or edema Neuro- strength and sensation are intact  EKG today reveals sinus rhythm 64 bpm, PR 190, QRS 102, Qtc 420  Assessment and Plan:  1. Afib/ atrial flutter The  patient has a h/o atrial fibrillation, likely worsened by his obesity and sleep apnea.   He presents today to discuss  ablation, however given his morbid obesity, he is not a candidate for ablation at this time.   We will therefore decrease amiodarone to 200mg  daily and check an amiodarone level.  Hopefully eventually we can decrease amiodarone to 100mg  daily. Increase xarelto to 20mg  daily.  2. OSA Compliance with CPAP advised  3. Obesity Weight loss is advised  Follow-up with Dr Jens Som as scheduled I will see as needed going forward

## 2013-07-15 ENCOUNTER — Other Ambulatory Visit: Payer: Self-pay | Admitting: *Deleted

## 2013-07-15 MED ORDER — METFORMIN HCL 500 MG PO TABS: 500.0000 mg | ORAL_TABLET | ORAL | Status: DC | PRN

## 2013-07-16 ENCOUNTER — Other Ambulatory Visit: Payer: Self-pay | Admitting: Family Medicine

## 2013-07-19 ENCOUNTER — Other Ambulatory Visit: Payer: Self-pay

## 2013-07-30 ENCOUNTER — Telehealth (HOSPITAL_COMMUNITY): Payer: Self-pay

## 2013-07-30 ENCOUNTER — Other Ambulatory Visit: Payer: Self-pay | Admitting: Family Medicine

## 2013-07-30 NOTE — Telephone Encounter (Signed)
Error

## 2013-08-02 ENCOUNTER — Other Ambulatory Visit: Payer: Self-pay | Admitting: *Deleted

## 2013-08-02 MED ORDER — LISINOPRIL 10 MG PO TABS: ORAL_TABLET | ORAL | Status: DC

## 2013-08-16 ENCOUNTER — Other Ambulatory Visit: Payer: BC Managed Care – PPO

## 2013-08-16 DIAGNOSIS — I4891 Unspecified atrial fibrillation: Secondary | ICD-10-CM

## 2013-08-17 ENCOUNTER — Other Ambulatory Visit: Payer: Self-pay | Admitting: Family Medicine

## 2013-08-18 ENCOUNTER — Ambulatory Visit: Payer: Self-pay | Admitting: Cardiology

## 2013-08-20 LAB — AMIODARONE LEVEL
Amiodarone Lvl: 0.8 ug/mL — ABNORMAL LOW (ref 1.5–2.5)
Desethylamiodarone: 0.6 ug/mL — ABNORMAL LOW (ref 1.5–2.5)

## 2013-08-30 ENCOUNTER — Other Ambulatory Visit: Payer: Self-pay | Admitting: Family Medicine

## 2013-09-22 ENCOUNTER — Other Ambulatory Visit: Payer: Self-pay | Admitting: Family Medicine

## 2013-10-01 ENCOUNTER — Other Ambulatory Visit: Payer: Self-pay | Admitting: *Deleted

## 2013-10-01 MED ORDER — LORAZEPAM 1 MG PO TABS: ORAL_TABLET | ORAL | Status: DC

## 2013-10-14 ENCOUNTER — Ambulatory Visit (INDEPENDENT_AMBULATORY_CARE_PROVIDER_SITE_OTHER): Payer: BC Managed Care – PPO | Admitting: Family Medicine

## 2013-10-14 ENCOUNTER — Encounter: Payer: Self-pay | Admitting: Family Medicine

## 2013-10-14 VITALS — BP 138/78 | HR 69 | Temp 98.0°F | Ht 69.0 in | Wt 390.0 lb

## 2013-10-14 DIAGNOSIS — E291 Testicular hypofunction: Secondary | ICD-10-CM

## 2013-10-14 DIAGNOSIS — E119 Type 2 diabetes mellitus without complications: Secondary | ICD-10-CM

## 2013-10-14 DIAGNOSIS — I10 Essential (primary) hypertension: Secondary | ICD-10-CM

## 2013-10-14 DIAGNOSIS — I4891 Unspecified atrial fibrillation: Secondary | ICD-10-CM

## 2013-10-14 DIAGNOSIS — I480 Paroxysmal atrial fibrillation: Secondary | ICD-10-CM

## 2013-10-14 DIAGNOSIS — J340 Abscess, furuncle and carbuncle of nose: Secondary | ICD-10-CM

## 2013-10-14 DIAGNOSIS — J3489 Other specified disorders of nose and nasal sinuses: Secondary | ICD-10-CM

## 2013-10-14 LAB — POCT UA - MICROALBUMIN
Creatinine, POC: 200 mg/dL
Microalbumin Ur, POC: 150 mg/L

## 2013-10-14 LAB — POCT GLYCOSYLATED HEMOGLOBIN (HGB A1C): Hemoglobin A1C: 6.8

## 2013-10-14 MED ORDER — MUPIROCIN 2 % EX OINT: 1.0000 "application " | TOPICAL_OINTMENT | Freq: Two times a day (BID) | CUTANEOUS | Status: DC

## 2013-10-14 MED ORDER — HYDROCODONE-ACETAMINOPHEN 10-325 MG PO TABS: 1.0000 | ORAL_TABLET | Freq: Three times a day (TID) | ORAL | Status: DC | PRN

## 2013-10-14 MED ORDER — FUSION PLUS PO CAPS: 2.0000 | ORAL_CAPSULE | Freq: Every day | ORAL | Status: DC

## 2013-10-14 MED ORDER — LISINOPRIL 5 MG PO TABS: 5.0000 mg | ORAL_TABLET | Freq: Every day | ORAL | Status: DC

## 2013-10-14 NOTE — Progress Notes (Signed)
   Subjective:    Patient ID: Rodney Bright, male    DOB: 04/08/1960, 53 y.o.   MRN: [id number]  HPI Diabetes - no hypoglycemic events. No wounds or sores that are not healing well. No increased thirst or urination. Not hecking glucose at home. Taking medications as prescribed without any side effects. Has had some vision changes.    Hypertension- Pt denies chest pain, SOB, dizziness, or heart palpitations.  Taking meds as directed w/o problems.  Denies medication side effects.    Afib- doing well on his amdiodarone.  Says since starting this med has had very few episodes.    Right sided nose bleed. This is been going on for him is 3 weeks. It is infected blood last night into his CPAP mask. It is occasionally sore and tender but not constantly. No fever chills or sweats. 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   Hypogonadism-he's using his AndroGel consistently everyday in the morning. He likes it is not having any side effects. Review of Systems     Objective:   Physical Exam  Constitutional: He is oriented to person, place, and time. He appears well-developed and well-nourished.  HENT:  Head: Normocephalic and atraumatic.  Right medial nasl ulcerated granulation tissue. Some bleeding   Cardiovascular: Normal rate, regular rhythm and normal heart sounds.   Pulmonary/Chest: Effort normal and breath sounds normal.  Neurological: He is alert and oriented to person, place, and time.  Skin: Skin is warm and dry.  Psychiatric: He has a normal mood and affect. His behavior is normal.          Assessment & Plan:  Diabetes- A1C is 6.8 today.  Well controlled.  Reminded him to do an eye exam.  He did start his statin  Hypertension-well-controlled. Continue current regimen.  .  Afib - stable on current regimen. Follow with Dr. Olga MillersBrian Bright.  Hypogonadism-still using his AndroGel daily inconsistently. The side effects or problems. No changes in mood. He is due for PSA and  testosterone level. He was postictal couple months ago but did not. We will repeat his lab slip today.  Nasal ulcer with granulation tissue. Avoid excessive blowing. Stop nasal steroid spray for couple days on that side of the nostril. Apply Bactroban ointment twice a day to the affected area. And if not improving over the next week or so then please let me know and we'll refer to ENT for further evaluation and possible biopsy of the lesion.

## 2013-10-28 ENCOUNTER — Other Ambulatory Visit: Payer: Self-pay | Admitting: *Deleted

## 2013-10-28 MED ORDER — DILTIAZEM HCL ER COATED BEADS 240 MG PO CP24: 240.0000 mg | ORAL_CAPSULE | Freq: Every day | ORAL | Status: DC

## 2013-10-28 NOTE — Telephone Encounter (Signed)
Pt called for refill on his diltiazem

## 2013-10-29 ENCOUNTER — Other Ambulatory Visit: Payer: Self-pay | Admitting: *Deleted

## 2013-10-29 MED ORDER — TESTOSTERONE 20.25 MG/ACT (1.62%) TD GEL: TRANSDERMAL | Status: DC

## 2013-11-10 ENCOUNTER — Other Ambulatory Visit: Payer: Self-pay | Admitting: *Deleted

## 2013-11-10 MED ORDER — LORAZEPAM 1 MG PO TABS: ORAL_TABLET | ORAL | Status: DC

## 2013-11-16 ENCOUNTER — Other Ambulatory Visit: Payer: Self-pay | Admitting: *Deleted

## 2013-11-16 MED ORDER — METFORMIN HCL 500 MG PO TABS: 500.0000 mg | ORAL_TABLET | ORAL | Status: DC | PRN

## 2013-11-17 ENCOUNTER — Other Ambulatory Visit: Payer: Self-pay | Admitting: Family Medicine

## 2013-11-17 DIAGNOSIS — F331 Major depressive disorder, recurrent, moderate: Secondary | ICD-10-CM

## 2013-11-17 MED ORDER — ESCITALOPRAM OXALATE 10 MG PO TABS: 10.0000 mg | ORAL_TABLET | ORAL | Status: DC

## 2013-12-02 ENCOUNTER — Encounter (HOSPITAL_COMMUNITY): Payer: Self-pay | Admitting: Psychiatry

## 2013-12-02 ENCOUNTER — Ambulatory Visit (INDEPENDENT_AMBULATORY_CARE_PROVIDER_SITE_OTHER): Payer: BC Managed Care – PPO | Admitting: Psychiatry

## 2013-12-02 VITALS — BP 120/89 | HR 76 | Ht 69.0 in | Wt 390.0 lb

## 2013-12-02 DIAGNOSIS — F1910 Other psychoactive substance abuse, uncomplicated: Secondary | ICD-10-CM

## 2013-12-02 DIAGNOSIS — F411 Generalized anxiety disorder: Secondary | ICD-10-CM

## 2013-12-02 DIAGNOSIS — F332 Major depressive disorder, recurrent severe without psychotic features: Secondary | ICD-10-CM

## 2013-12-02 DIAGNOSIS — F321 Major depressive disorder, single episode, moderate: Secondary | ICD-10-CM
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
## 2013-12-06 ENCOUNTER — Telehealth: Payer: Self-pay | Admitting: Internal Medicine

## 2013-12-06 ENCOUNTER — Other Ambulatory Visit: Payer: Self-pay | Admitting: *Deleted

## 2013-12-06 MED ORDER — TESTOSTERONE 20.25 MG/ACT (1.62%) TD GEL: TRANSDERMAL | Status: DC

## 2013-12-06 MED ORDER — ALLOPURINOL 100 MG PO TABS: ORAL_TABLET | ORAL | Status: DC

## 2013-12-06 NOTE — Telephone Encounter (Addendum)
New message     Pt was seen in the hospital a few days ago because of bleeding.  The hosp doctor determined it was from the xeralto and had pt to stop it.  Should he stay off xeralto?

## 2013-12-06 NOTE — Telephone Encounter (Signed)
Discussed with Dr Johney Frame, will need a follow up with Dr Jens Som to discuss

## 2013-12-07 NOTE — Telephone Encounter (Signed)
Left message for pt to call.

## 2013-12-08 ENCOUNTER — Ambulatory Visit: Payer: Self-pay | Admitting: Family Medicine

## 2013-12-08 NOTE — Telephone Encounter (Signed)
F/u   Pt returning call from [facility]

## 2013-12-08 NOTE — Telephone Encounter (Signed)
Schedule fuov Fayez Sturgell  

## 2013-12-09 NOTE — Telephone Encounter (Signed)
Left message for pt to call to schedule follow up appt with dr Jens Som in [place] next available.

## 2013-12-10 ENCOUNTER — Encounter: Payer: Self-pay | Admitting: Cardiology

## 2013-12-10 NOTE — Telephone Encounter (Signed)
This encounter was created in error - please disregard.

## 2013-12-10 NOTE — Telephone Encounter (Signed)
Returrning your call.

## 2013-12-16 ENCOUNTER — Other Ambulatory Visit: Payer: Self-pay | Admitting: *Deleted

## 2013-12-16 DIAGNOSIS — F331 Major depressive disorder, recurrent, moderate: Secondary | ICD-10-CM

## 2013-12-16 MED ORDER — ESCITALOPRAM OXALATE 10 MG PO TABS: 10.0000 mg | ORAL_TABLET | ORAL | Status: DC

## 2013-12-23 ENCOUNTER — Encounter: Payer: Self-pay | Admitting: Family Medicine

## 2013-12-23 ENCOUNTER — Ambulatory Visit (INDEPENDENT_AMBULATORY_CARE_PROVIDER_SITE_OTHER): Payer: BC Managed Care – PPO | Admitting: Family Medicine

## 2013-12-23 ENCOUNTER — Telehealth (HOSPITAL_COMMUNITY): Payer: Self-pay

## 2013-12-23 VITALS — BP 108/59 | HR 68 | Temp 98.2°F | Ht 69.0 in | Wt 393.0 lb

## 2013-12-23 DIAGNOSIS — K5733 Diverticulitis of large intestine without perforation or abscess with bleeding: Secondary | ICD-10-CM

## 2013-12-23 DIAGNOSIS — I10 Essential (primary) hypertension: Secondary | ICD-10-CM | POA: Diagnosis not present

## 2013-12-23 DIAGNOSIS — D62 Acute posthemorrhagic anemia: Secondary | ICD-10-CM

## 2013-12-23 DIAGNOSIS — K5793 Diverticulitis of intestine, part unspecified, without perforation or abscess with bleeding: Secondary | ICD-10-CM

## 2013-12-23 MED ORDER — LORAZEPAM 1 MG PO TABS: ORAL_TABLET | ORAL | Status: DC

## 2013-12-23 NOTE — Progress Notes (Signed)
Subjective:    Patient ID: Rodney Bright, male    DOB: Aug 08, 1960, 53 y.o.   MRN: [id number]  HPI Here for hospital followup for GI bleed. He was admitted to [facility]. He was admitted on August 20 and discharged on August 25. He was on Xarelto for atrial fibrillation with a prior history of gastric bypass with Roux-en-Y. He presented emergency department with near syncope and 7 days of bright blood per time. He was hypotensive in the emergency department. His hemoglobin was 6.9. Compared to previous hemoglobin of 12.7 in February. Patient was diagnosed with acute blood loss anemia and hemorrhagic shock. He did have new onset left bundle branch block the cardiac enzymes were negative x3. He was told he could resume anticoagulation within one week for his atrial fibrillation. He received 8 units of packed red blood cells.   he says he is eating well and hydrating well.    Review of Systems  BP 108/59  Pulse 68  Temp(Src) 98.2 F (36.8 C)  Ht  (1.753 m)  Wt 393 lb (178.264 kg)  BMI 58.01 kg/m2  SpO2 96%    Allergies  Allergen Reactions  . Colchicine Shortness Of Breath, Swelling and Itching    Past Medical History  Diagnosis Date  . Paroxysmal atrial fibrillation   . Lymphadenopathy   . Testosterone deficiency   . Anemia   . Vitamin D deficiency   . ED (erectile dysfunction)   . Morbid obesity   . OSA (obstructive sleep apnea)     CPAP-17.5 cm water pressure  . Kidney stones     stent, lithotripsy  . Diabetes mellitus     controlled  . Gout   . S/P emergency tracheotomy for assistance in breathing     at age 23  . Pneumonia     Past Surgical History  Procedure Laterality Date  . Gastric bypass  2005    600 lbs prior to surgery  . Cholecystectomy    . Tonsillectomy    . Finger surgery      ulnar digital nerve and artery right index finger  . Cystoscopy      retrograde and double J catheter insertion.    History   Social History  . Marital  Status: Married    Spouse Name: N/A    Number of Children: N/A  . Years of Education: N/A   Occupational History  . Not on file.   Social History Main Topics  . Smoking status: Never Smoker   . Smokeless tobacco: Not on file  . Alcohol Use: No  . Drug Use: No     Comment: prior cocaine abuse (heavy) in his 32s.  denies subsequent use  . Sexual Activity: Yes    Partners: Female    Copy: None     Comment: on full disability, separated, no regular exercise,walks some, drinks pot of coffee a day.   Other Topics Concern  . Not on file   Social History Narrative   Drinks one pot of coffee daily.   Regular exercise-no, walks some      Lives in [place] with roommate.   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    Family History  Problem Relation Age of Onset  . Cancer Mother     lung, heavy smoker  . Hypertension Mother   . Hyperlipidemia Mother   . Cancer Father     melanoma  . Aneurysm  Father     cardiac  . Hyperlipidemia Father   . Hypertension Father   . Hyperlipidemia Sister   . Hypertension Sister   . Hyperlipidemia Brother   . Hypertension Brother   . Stroke Other     Outpatient Encounter Prescriptions as of 12/23/2013  Medication Sig  . allopurinol (ZYLOPRIM) 100 MG tablet TAKE 1 TABLET BY MOUTH EVERY DAY  . AMBULATORY NON FORMULARY MEDICATION Cyclobenzaprine 2% gabapentin 3% Baclofen 2%  Diclofenac 4% Qty# 120 ml Sig: apply small amount topically to effected area 2-3 times a day as directed 2 refills  . AMBULATORY NON FORMULARY MEDICATION Medication Name: CPAP  . amiodarone (PACERONE) 400 MG tablet Take 0.5 tablets (200 mg total) by mouth daily.  . AMRIX 30 MG 24 hr capsule   . BELSOMRA 10 MG TABS   . diltiazem (CARDIZEM CD) 240 MG 24 hr capsule Take 1 capsule (240 mg total) by mouth daily.  Marland Kitchen escitalopram (LEXAPRO) 10 MG tablet Take 1 tablet (10 mg total) by mouth as directed. Take 20 mg QAM, 10 mg at 4 PM,  10 mg at 12 AM.  . ferrous fumarate-iron polysaccharide complex (TANDEM) 162-115.2 MG CAPS Take 1 capsule by mouth.  Marland Kitchen GLUCOSAMINE-CHONDROITIN PO Take 1 tablet by mouth daily.  Marland Kitchen HYDROcodone-acetaminophen (NORCO) 10-325 MG per tablet Take 1 tablet by mouth every 8 (eight) hours as needed.  . Iron-FA-B Cmp-C-Biot-Probiotic (FUSION PLUS) CAPS Take 2 capsules by mouth daily.  Boris Lown Oil 300 MG CAPS Take 1 capsule by mouth.  Marland Kitchen lisinopril (PRINIVIL,ZESTRIL) 5 MG tablet Take 1 tablet (5 mg total) by mouth daily. Take a half ( ) tablet daily.  Marland Kitchen LORazepam (ATIVAN) 1 MG tablet TAKE 1 TABLET BY MOUTH EVERY 8 HOURS  . metFORMIN (GLUCOPHAGE) 500 MG tablet Take 1 tablet (500 mg total) by mouth as needed.  . Multiple Vitamin (THERA) TABS Take 1 tablet by mouth.  . Oxycodone HCl 10 MG TABS Take 1 tablet by mouth daily.  . pantoprazole (PROTONIX) 40 MG tablet   . pravastatin (PRAVACHOL) 40 MG tablet Take 40 mg by mouth at bedtime. Patient has not started this yet (06/08/13)  . Testosterone (ANDROGEL PUMP) 20.25 MG/ACT (1.62%) GEL PLACE 4 PUMPS ON SKIN EVERY MORNING  . tiZANidine (ZANAFLEX) 4 MG tablet   . Vitamin D, Ergocalciferol, (DRISDOL) 50000 UNITS CAPS capsule Take 50,000 Units by mouth daily.   . [DISCONTINUED] mupirocin ointment (BACTROBAN) 2 % Apply topically as needed.  . [DISCONTINUED] mupirocin ointment (BACTROBAN) 2 % Place 1 application into the nose 2 (two) times daily.  . [DISCONTINUED] NON FORMULARY Joint Juice 1 daily  . [DISCONTINUED] Rivaroxaban (XARELTO) 20 MG TABS tablet Take 1 tablet (20 mg total) by mouth daily.          Objective:   Physical Exam  Constitutional: He is oriented to person, place, and time. He appears well-developed and well-nourished.  HENT:  Head: Normocephalic and atraumatic.  Right Ear: External ear normal.  Left Ear: External ear normal.  Eyes: Conjunctivae are normal.  Cardiovascular: Normal rate, regular rhythm and normal heart sounds.    Pulmonary/Chest: Effort normal and breath sounds normal.  Neurological: He is alert and oriented to person, place, and time.  Skin: Skin is warm and dry.  Psychiatric: He has a normal mood and affect. His behavior is normal.          Assessment & Plan:  GI bleed-secondary to Xarelto. Due for repeat CBC. He has not had a repeat CBC  yet. His next appointment with cardiology is not for another 4 weeks. We will call to get him an appointment sooner. So that he can discuss options for other blood thinners for his atrial fibrillation.  Atrial fibrillation-currently not on a blood thinner. Can discuss further with cardiology. We were able to get him an appointment on Monday.  He says he received a flu vaccine while hospitalized.

## 2013-12-23 NOTE — Telephone Encounter (Signed)
Called [facility] for prescription refill for Ativan 1 mg,  90 tablets to take by mouth every 8 hours and 0 refills. Per Dr. Gilmore Laroche. Pt was last seen in office on 8/27 and next appt is 10/29.

## 2013-12-23 NOTE — Telephone Encounter (Signed)
PT needs a refill on Lexapro and Ativan, pharmacy on file.

## 2013-12-23 NOTE — Assessment & Plan Note (Signed)
Well controlled today. Continue current regimen. 

## 2013-12-24 LAB — CBC
HCT: 34 % — ABNORMAL LOW (ref 39.0–52.0)
Hemoglobin: 10.4 g/dL — ABNORMAL LOW (ref 13.0–17.0)
MCH: 27.7 pg (ref 26.0–34.0)
MCHC: 30.6 g/dL (ref 30.0–36.0)
MCV: 90.7 fL (ref 78.0–100.0)
Platelets: 230 10*3/uL (ref 150–400)
RBC: 3.75 MIL/uL — ABNORMAL LOW (ref 4.22–5.81)
RDW: 15.3 % (ref 11.5–15.5)
WBC: 6.3 10*3/uL (ref 4.0–10.5)

## 2013-12-24 LAB — COMPLETE METABOLIC PANEL WITH GFR
ALT: 17 U/L (ref 0–53)
AST: 18 U/L (ref 0–37)
Albumin: 3.9 g/dL (ref 3.5–5.2)
Alkaline Phosphatase: 70 U/L (ref 39–117)
BUN: 13 mg/dL (ref 6–23)
CO2: 27 mEq/L (ref 19–32)
Calcium: 8.9 mg/dL (ref 8.4–10.5)
Chloride: 108 mEq/L (ref 96–112)
Creat: 1.3 mg/dL (ref 0.50–1.35)
GFR, Est African American: 71 mL/min
GFR, Est Non African American: 61 mL/min
Glucose, Bld: 162 mg/dL — ABNORMAL HIGH (ref 70–99)
Potassium: 4.5 mEq/L (ref 3.5–5.3)
Sodium: 142 mEq/L (ref 135–145)
Total Bilirubin: 0.4 mg/dL (ref 0.2–1.2)
Total Protein: 6.3 g/dL (ref 6.0–8.3)

## 2013-12-24 LAB — LIPID PANEL
Cholesterol: 125 mg/dL (ref 0–200)
HDL: 41 mg/dL (ref >39)
LDL Cholesterol: 59 mg/dL (ref 0–99)
Total CHOL/HDL Ratio: 3 Ratio
Triglycerides: 126 mg/dL (ref <150)
VLDL: 25 mg/dL (ref 0–40)

## 2013-12-27 ENCOUNTER — Ambulatory Visit (INDEPENDENT_AMBULATORY_CARE_PROVIDER_SITE_OTHER): Payer: BC Managed Care – PPO | Admitting: Cardiology

## 2013-12-27 ENCOUNTER — Encounter: Payer: Self-pay | Admitting: Cardiology

## 2013-12-27 VITALS — BP 118/78 | HR 52 | Ht 69.0 in | Wt 389.0 lb

## 2013-12-27 DIAGNOSIS — I4891 Unspecified atrial fibrillation: Secondary | ICD-10-CM

## 2013-12-27 LAB — TSH: TSH: 1.778 u[IU]/mL (ref 0.350–4.500)

## 2013-12-27 MED ORDER — AMIODARONE HCL 100 MG PO TABS: 100.0000 mg | ORAL_TABLET | Freq: Two times a day (BID) | ORAL | Status: DC

## 2013-12-27 MED ORDER — ASPIRIN EC 81 MG PO TBEC: 81.0000 mg | DELAYED_RELEASE_TABLET | Freq: Every day | ORAL | Status: DC

## 2013-12-27 MED ORDER — AMIODARONE HCL 200 MG PO TABS: 200.0000 mg | ORAL_TABLET | Freq: Every day | ORAL | Status: DC

## 2013-12-27 NOTE — Assessment & Plan Note (Signed)
Patient remains in sinus rhythm. Continue Cardizem and amiodarone. He has been taking 400 mg of amiodarone daily as this has suppressed his atrial fibrillation better. I would like to avoid this dose long term because of potential side effects. Decreased to 200 mg daily and follow for recurrent atrial fibrillation. Long discussion today concerning anticoagulation. He had a life-threatening bleed while on xarelto. His CHADS score is 2. We discussed the risks and benefits of anticoagulation. He would prefer to take aspirin to avoid the risk of recurrent GI bleed. He will begin 81 mg daily. Check TSH. Recent liver functions normal. Check chest x-ray.

## 2013-12-27 NOTE — Assessment & Plan Note (Signed)
Blood pressure controlled. Continue present medications. 

## 2013-12-27 NOTE — Assessment & Plan Note (Signed)
Discussed weight loss. He is considering evaluation in [place] for redo gastric bypass surgery.

## 2013-12-27 NOTE — Progress Notes (Signed)
HPI: FU atrial fibrillation. Previously presented to [facility] with atrial fibrillation. Based on outside records LV function was normal. Stress echocardiogram in March of 2012 showed no ischemia. Echocardiogram in March of 2014 showed an ejection fraction of 40-45%, mild left atrial enlargement and grade 2 diastolic dysfunction. Study was technically difficult. Nuclear study in April of 2014 at [facility] showed an ejection fraction greater than 65% and normal perfusion. Patient had an outpatient monitor for recurrent palpitations which revealed wide-complex tachycardia. The patient was seen by Dr. Johney Frame and this was felt to be atrial flutter with one-to-one conduction. He was started on flecainide. Followup exercise treadmill showed no induced ventricular arrhythmias but there was a rate related left bundle branch block. Flecanide changed to amiodarone previously at [facility]. Patient felt not to be a candidate for ablation due to size. Hospitalized at [facility] 8/15 with GI bleed and anticoagulation DCed. Patient did have a colonoscopy which revealed pandiverticulosis. He also was transfused 8 units of packed red blood cells. Discharge summary states anticoagulation can be resumed in one week if hemoglobin stable. Since then, He denies dyspnea, chest pain, palpitations or syncope.   Current Outpatient Prescriptions  Medication Sig Dispense Refill  . allopurinol (ZYLOPRIM) 100 MG tablet TAKE 1 TABLET BY MOUTH EVERY DAY  30 tablet  3  . AMBULATORY NON FORMULARY MEDICATION Cyclobenzaprine 2% gabapentin 3% Baclofen 2%  Diclofenac 4% Qty# 120 ml Sig: apply small amount topically to effected area 2-3 times a day as directed 2 refills      . AMBULATORY NON FORMULARY MEDICATION Medication Name: CPAP      . amiodarone (PACERONE) 400 MG tablet Take 0.5 tablets (200 mg total) by mouth daily.  90 tablet  3  . BELSOMRA 10 MG TABS       . diltiazem (CARDIZEM CD) 240 MG 24 hr capsule Take 1  capsule (240 mg total) by mouth daily.  90 capsule  3  . escitalopram (LEXAPRO) 10 MG tablet Take 1 tablet (10 mg total) by mouth as directed. Take 20 mg QAM, 10 mg at 4 PM, 10 mg at 12 AM.  120 tablet  1  . ferrous fumarate-iron polysaccharide complex (TANDEM) 162-115.2 MG CAPS Take 1 capsule by mouth.      Marland Kitchen GLUCOSAMINE-CHONDROITIN PO Take 1 tablet by mouth daily.      Marland Kitchen HYDROcodone-acetaminophen (NORCO) 10-325 MG per tablet Take 1 tablet by mouth every 8 (eight) hours as needed.  90 tablet  0  . Iron-FA-B Cmp-C-Biot-Probiotic (FUSION PLUS) CAPS Take 2 capsules by mouth daily.  60 capsule  6  . lisinopril (PRINIVIL,ZESTRIL) 5 MG tablet Take 1 tablet (5 mg total) by mouth daily. Take a half ( ) tablet daily.  90 tablet  1  . LORazepam (ATIVAN) 1 MG tablet TAKE 1 TABLET BY MOUTH EVERY 8 HOURS  90 tablet  0  . metFORMIN (GLUCOPHAGE) 500 MG tablet Take 1 tablet (500 mg total) by mouth as needed.  30 tablet  3  . Multiple Vitamin (THERA) TABS Take 1 tablet by mouth.      . Oxycodone HCl 10 MG TABS Take 1 tablet by mouth daily.      . pantoprazole (PROTONIX) 40 MG tablet       . pravastatin (PRAVACHOL) 40 MG tablet Take 40 mg by mouth at bedtime. Patient has not started this yet (06/08/13)      . Testosterone (ANDROGEL PUMP) 20.25 MG/ACT (1.62%) GEL PLACE 4 PUMPS ON SKIN  EVERY MORNING  150 g  0  . tiZANidine (ZANAFLEX) 4 MG tablet       . Vitamin D, Ergocalciferol, (DRISDOL) 50000 UNITS CAPS capsule Take 50,000 Units by mouth daily.       . AMRIX 30 MG 24 hr capsule       . Krill Oil 300 MG CAPS Take 1 capsule by mouth.       No current facility-administered medications for this visit.     Past Medical History  Diagnosis Date  . Paroxysmal atrial fibrillation   . Lymphadenopathy   . Testosterone deficiency   . Anemia   . Vitamin D deficiency   . ED (erectile dysfunction)   . Morbid obesity   . OSA (obstructive sleep apnea)     CPAP-17.5 cm water pressure  . Kidney stones     stent,  lithotripsy  . Diabetes mellitus     controlled  . Gout   . S/P emergency tracheotomy for assistance in breathing     at age 77  . Pneumonia     Past Surgical History  Procedure Laterality Date  . Gastric bypass  2005    600 lbs prior to surgery  . Cholecystectomy    . Tonsillectomy    . Finger surgery      ulnar digital nerve and artery right index finger  . Cystoscopy      retrograde and double J catheter insertion.    History   Social History  . Marital Status: Married    Spouse Name: N/A    Number of Children: N/A  . Years of Education: N/A   Occupational History  . Not on file.   Social History Main Topics  . Smoking status: Never Smoker   . Smokeless tobacco: Not on file  . Alcohol Use: No  . Drug Use: No     Comment: prior cocaine abuse (heavy) in his 39s.  denies subsequent use  . Sexual Activity: Yes    Partners: Female    Copy: None     Comment: on full disability, separated, no regular exercise,walks some, drinks pot of coffee a day.   Other Topics Concern  . Not on file   Social History Narrative   Drinks one pot of coffee daily.   Regular exercise-no, walks some      Lives in [place] with roommate.   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    ROS: no fevers or chills, productive cough, hemoptysis, dysphasia, odynophagia, melena, hematochezia, dysuria, hematuria, rash, seizure activity, orthopnea, PND, pedal edema, claudication. Remaining systems are negative.  Physical Exam: Well-developed morbidly obese in no acute distress.  Skin is warm and dry.  HEENT is normal.  Neck is supple.  Chest is clear to auscultation with normal expansion.  Cardiovascular exam is regular rate and rhythm.  Abdominal exam nontender or distended. No masses palpated. Extremities show no edema. neuro grossly intact  ECG Sinus bradycardia, rate 52. No significant ST changes.

## 2013-12-27 NOTE — Patient Instructions (Signed)
Your physician wants you to follow-up in: 6 MONTHS WITH DR Jens Som You will receive a reminder letter in the mail two months in advance. If you don't receive a letter, please call our office to schedule the follow-up appointment.   A chest x-ray takes a picture of the organs and structures inside the chest, including the heart, lungs, and blood vessels. This test can show several things, including, whether the heart is enlarges; whether fluid is building up in the lungs; and whether pacemaker / defibrillator leads are still in place. [facility]  Your physician recommends that you HAVE LAB WORK TODAY  START ASPIRIN 81 MG ONCE DAILY  DECREASE AMIODARONE TO 200 MG ONCE DAILY

## 2013-12-28 ENCOUNTER — Other Ambulatory Visit: Payer: Self-pay | Admitting: *Deleted

## 2013-12-28 DIAGNOSIS — D62 Acute posthemorrhagic anemia: Secondary | ICD-10-CM

## 2014-01-04 ENCOUNTER — Other Ambulatory Visit: Payer: Self-pay

## 2014-01-04 MED ORDER — TESTOSTERONE 20.25 MG/ACT (1.62%) TD GEL: TRANSDERMAL | Status: DC

## 2014-01-10 ENCOUNTER — Telehealth: Payer: Self-pay | Admitting: Cardiology

## 2014-01-10 DIAGNOSIS — I4891 Unspecified atrial fibrillation: Secondary | ICD-10-CM

## 2014-01-10 MED ORDER — AMIODARONE HCL 100 MG PO TABS: 100.0000 mg | ORAL_TABLET | Freq: Two times a day (BID) | ORAL | Status: DC

## 2014-01-10 NOTE — Telephone Encounter (Signed)
Pt says he still have not received his Amiodarone 100 mg. Would you please call this today to [facility]-[phone].,he is completely out of his medicine.

## 2014-01-10 NOTE — Telephone Encounter (Signed)
Spoke with pt, aware refill sent to the pharm.

## 2014-01-10 NOTE — Telephone Encounter (Signed)
Pt calling again,says he needs his medicine today,does not have any. and  The pharmacy close at 5:00.

## 2014-01-12 ENCOUNTER — Other Ambulatory Visit: Payer: Self-pay | Admitting: *Deleted

## 2014-01-12 MED ORDER — TESTOSTERONE 20.25 MG/ACT (1.62%) TD GEL: TRANSDERMAL | Status: DC

## 2014-01-13 ENCOUNTER — Ambulatory Visit: Payer: Self-pay | Admitting: Family Medicine

## 2014-01-20 ENCOUNTER — Ambulatory Visit: Payer: Self-pay | Admitting: Family Medicine

## 2014-01-26 ENCOUNTER — Ambulatory Visit (INDEPENDENT_AMBULATORY_CARE_PROVIDER_SITE_OTHER): Payer: BC Managed Care – PPO

## 2014-01-26 ENCOUNTER — Ambulatory Visit (INDEPENDENT_AMBULATORY_CARE_PROVIDER_SITE_OTHER): Payer: BC Managed Care – PPO | Admitting: Cardiology

## 2014-01-26 ENCOUNTER — Encounter: Payer: Self-pay | Admitting: Cardiology

## 2014-01-26 VITALS — BP 122/78 | HR 71 | Ht 69.0 in | Wt 362.0 lb

## 2014-01-26 DIAGNOSIS — I4891 Unspecified atrial fibrillation: Secondary | ICD-10-CM

## 2014-01-26 DIAGNOSIS — I480 Paroxysmal atrial fibrillation: Secondary | ICD-10-CM

## 2014-01-26 DIAGNOSIS — Z048 Encounter for examination and observation for other specified reasons: Secondary | ICD-10-CM

## 2014-01-26 DIAGNOSIS — I10 Essential (primary) hypertension: Secondary | ICD-10-CM

## 2014-01-26 NOTE — Assessment & Plan Note (Signed)
Patient remains in sinus rhythm. Continue Cardizem and amiodarone. Change amiodarone to 200 mg daily. Long discussion today concerning anticoagulation. He had a life-threatening bleed while on xarelto. His CHADS score is 2. We discussed the risks and benefits of anticoagulation. He would prefer to take aspirin to avoid the risk of recurrent GI bleed. He will begin 81 mg daily. He understands higher risk of CVA. Recent TSH and liver functions normal. Check chest x-ray.

## 2014-01-26 NOTE — Patient Instructions (Signed)
Your physician wants you to follow-up in: 6 MONTHS WITH DR CRENSHAW You will receive a reminder letter in the mail two months in advance. If you don't receive a letter, please call our office to schedule the follow-up appointment.  

## 2014-01-26 NOTE — Assessment & Plan Note (Signed)
I discussed the importance of weight loss today.

## 2014-01-26 NOTE — Assessment & Plan Note (Signed)
Blood pressure controlled. Continue present medications. 

## 2014-01-26 NOTE — Progress Notes (Signed)
HPI: FU atrial fibrillation. Previously presented to [facility] with atrial fibrillation. Based on outside records LV function was normal. Stress echocardiogram in March of 2012 showed no ischemia. Echocardiogram in March of 2014 showed an ejection fraction of 40-45%, mild left atrial enlargement and grade 2 diastolic dysfunction. Study was technically difficult. [facility] April of 2014 [facility] showed an ejection fraction greater than 65% and normal perfusion. Patient had an outpatient monitor for recurrent palpitations which revealed wide-complex tachycardia. The patient was seen by Dr. Johney FrameAllred and this was felt to be atrial flutter with one-to-one conduction. He was started on flecainide. Followup exercise treadmill showed no induced ventricular arrhythmias but there was a rate related left bundle branch block. Flecanide changed to [facility]ar[facility] previously [facility]. Patient felt not to be a candidate for ablation due to size. Hospitalized [facility] 8/15 with GI bleed and anticoagulation DCed. Patient did have a colonoscopy which revealed pandiverticulosis. He also was transfused 8 units of packed red blood cells. Discharge summary states anticoagulation can be resumed in one week if hemoglobin stable. At last office visit patient decided to continue aspirin and avoid Coumadin. Since I last saw him, He denies dyspnea, chest pain, palpitations or syncope.   Current Outpatient Prescriptions  Medication Sig Dispense Refill  . allopurinol (ZYLOPRIM) 100 MG tablet TAKE 1 TABLET BY MOUTH EVERY DAY  30 tablet  3  . AMBULATORY NON FORMULARY MEDICATION Cyclobenzaprine 2% gabapentin 3% Baclofen 2%  Diclofenac 4% Qty# 120 ml Sig: apply small amount topically to effected area 2-3 times a day as directed 2 refills      . AMBULATORY NON FORMULARY MEDICATION Medication Name: CPAP      . amiodarone (PACERONE) 100 MG tablet Take 1 tablet (100 mg total) by mouth 2 (two) times daily.  180  tablet  3  . AMRIX 30 MG 24 hr capsule Take 30 mg by mouth as needed for muscle spasms.       Marland Kitchen. aspirin EC 81 MG tablet Take 1 tablet (81 mg total) by mouth daily.  90 tablet  3  . BELSOMRA 10 MG TABS Take by mouth at bedtime as needed (Insomnia).       Marland Kitchen. diltiazem (CARDIZEM CD) 240 MG 24 hr capsule Take 1 capsule (240 mg total) by mouth daily.  90 capsule  3  . escitalopram (LEXAPRO) 10 MG tablet Take 1 tablet (10 mg total) by mouth as directed. Take 20 mg QAM, 10 mg at 4 PM, 10 mg at 12 AM.  120 tablet  1  . ferrous fumarate-iron polysaccharide complex (TANDEM) 162-115.2 MG CAPS Take 1 capsule by mouth.      Marland Kitchen. GLUCOSAMINE-CHONDROITIN PO Take 1 tablet by mouth daily.      Marland Kitchen. HYDROcodone-acetaminophen (NORCO) 10-325 MG per tablet Take 1 tablet by mouth every 8 (eight) hours as needed.  90 tablet  0  . Iron-FA-B Cmp-C-Biot-Probiotic (FUSION PLUS) CAPS Take 2 capsules by mouth daily.  60 capsule  6  . lisinopril (PRINIVIL,ZESTRIL) 5 MG tablet Take 1 tablet (5 mg total) by mouth daily. Take a half (5mg ) tablet daily.  90 tablet  1  . LORazepam (ATIVAN) 1 MG tablet TAKE 1 TABLET BY MOUTH EVERY 8 HOURS  90 tablet  0  . metFORMIN (GLUCOPHAGE) 500 MG tablet Take 1 tablet (500 mg total) by mouth as needed.  30 tablet  3  . Multiple Vitamin (THERA) TABS Take 1 tablet by mouth.      .Marland Kitchen  Oxycodone HCl 10 MG TABS Take 1 tablet by mouth daily.      . pantoprazole (PROTONIX) 40 MG tablet Take 40 mg by mouth daily.       . pravastatin (PRAVACHOL) 40 MG tablet Take 40 mg by mouth at bedtime. Patient has not started this yet (06/08/13)      . Testosterone (ANDROGEL PUMP) 20.25 MG/ACT (1.62%) GEL PLACE 4 PUMPS ON SKIN EVERY MORNING  150 g  5  . tiZANidine (ZANAFLEX) 4 MG tablet Take 4 mg by mouth once.       . Vitamin D, Ergocalciferol, (DRISDOL) 50000 UNITS CAPS capsule Take 50,000 Units by mouth daily.       Marland Kitchen. zolpidem (AMBIEN) 10 MG tablet Take 10 mg by mouth at bedtime.        No current facility-administered  medications for this visit.     Past Medical History  Diagnosis Date  . Paroxysmal atrial fibrillation   . Lymphadenopathy   . Testosterone deficiency   . Anemia   . Vitamin D deficiency   . ED (erectile dysfunction)   . Morbid obesity   . OSA (obstructive sleep apnea)     CPAP-17.5 cm water pressure  . Kidney stones     stent, lithotripsy  . Diabetes mellitus     controlled  . Gout   . S/P emergency tracheotomy for assistance in breathing     at age 89  . Pneumonia     Past Surgical History  Procedure Laterality Date  . Gastric bypass  2005    600 lbs prior to surgery  . Cholecystectomy    . Tonsillectomy    . Finger surgery      ulnar digital nerve and artery right index finger  . Cystoscopy      retrograde and double J catheter insertion.    History   Social History  . Marital Status: Married    Spouse Name: N/A    Number of Children: N/A  . Years of Education: N/A   Occupational History  . Not on file.   Social History Main Topics  . Smoking status: Never Smoker   . Smokeless tobacco: Not on file  . Alcohol Use: No  . Drug Use: No     Comment: prior cocaine abuse (heavy) in his 3620s.  denies subsequent use  . Sexual Activity: Yes    Partners: Female    CopyBirth Control/ Protection: None     Comment: on full disability, separated, no regular exercise,walks some, drinks pot of coffee a day.   Other Topics Concern  . Not on file   Social History Narrative   Drinks one pot of coffee daily.   Regular exercise-no, walks some      [place] in [place] roommate.   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    ROS: no fevers or chills, productive cough, hemoptysis, dysphasia, odynophagia, melena, hematochezia, dysuria, hematuria, rash, seizure activity, orthopnea, PND, pedal edema, claudication. Remaining systems are negative.  Physical Exam: Well-developed morbidly obese in no acute distress.  Skin is warm and dry.  HEENT is  normal.  Neck is supple.  Chest is clear to auscultation with normal expansion.  Cardiovascular exam is regular rate and rhythm.  Abdominal exam nontender or distended. No masses palpated. Extremities show no edema. neuro grossly intact

## 2014-01-31 ENCOUNTER — Other Ambulatory Visit: Payer: Self-pay

## 2014-02-03 ENCOUNTER — Ambulatory Visit (INDEPENDENT_AMBULATORY_CARE_PROVIDER_SITE_OTHER): Payer: BC Managed Care – PPO | Admitting: Psychiatry

## 2014-02-03 ENCOUNTER — Encounter (INDEPENDENT_AMBULATORY_CARE_PROVIDER_SITE_OTHER): Payer: Self-pay

## 2014-02-03 VITALS — BP 110/76 | HR 65 | Ht 69.0 in | Wt 365.0 lb

## 2014-02-03 DIAGNOSIS — F411 Generalized anxiety disorder: Secondary | ICD-10-CM

## 2014-02-03 DIAGNOSIS — F332 Major depressive disorder, recurrent severe without psychotic features: Secondary | ICD-10-CM

## 2014-02-03 DIAGNOSIS — F1921 Other psychoactive substance dependence, in remission: Secondary | ICD-10-CM

## 2014-02-03 DIAGNOSIS — F321 Major depressive disorder, single episode, moderate: Secondary | ICD-10-CM

## 2014-02-03 MED ORDER — ARIPIPRAZOLE 5 MG PO TABS: 5.0000 mg | ORAL_TABLET | Freq: Every day | ORAL | Status: DC

## 2014-02-03 NOTE — Progress Notes (Signed)
Patient ID: Rodney Bright, male   DOB: 11/29/1960, 53 y.o.   MRN: [id number]   [facility] Follow-up Outpatient Visit  Rodney Bright [birth year]  Date: 12/02/2013  HPI Comments: Rodney Bright is a 53 y/o male with a past psychiatric history significant for symptoms of depression and anxiety. The patient is referred for psychiatric services for medication management.   . Location- on lexapro. 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 Want to consider abilify, has read about it. I cautioned about side effects . Quality: The patient continues to take lexapro 20, 10, 10 mg.  patient closely follows with cardiologist because of his atrial fibrillation. In past he was  admitted in hospital because of polyps oozing blood and he is blood count was very low. He has had 9 pints of transfusion.  He continues to endorse feeling somewhat lonely withdrawn. Despite his medical conditions he tries to keep himself upbeat and follows up with his providers. Is not addressed back into drugs or alcohol. He's got a prescription refill for Ativan upstairs primary care physician. Recently Lexapro was reviewed since he is not visited this clinic for a while his primary care doctor renewed Lexapro. He has been cautioned not to increase his dose of Ativan considering his remote history of using substances.  But considering his anxiety level and atrial fibrillation he can continue a reasonable or manageable dose of Ativan that helps him. 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 . Severity: Depression: 6/10 (0=Very depressed; 5=Neutral; 10=Very Happy)  Anxiety- 4/10 (0=no anxiety; 5= moderate/tolerable anxiety; 10= panic attacks) . Duration: Patient has had symptoms over 20 years. . Timing: Anxiety worst at nght. . Context-Patient reports that his mood is  worse during the winter. . Modifying factors- Improved with Therapy and animals.  Worsens with family and work stressor.  . Associated signs and symptoms: In the area of affective symptoms, patient appears somewhat dysphoric. Patient denies current suicidal ideation, intent, or plan. Patient denies current homicidal ideation, intent, or plan. Patient denies auditory hallucinations. Patient denies visual hallucinations. Patient denies symptoms of paranoia. Patient states sleep is good, but reports he sleeps 8-9 hours. Appetite is good. Energy level is fair to good. Patient denies symptoms of anhedonia.      Review of Systems  Constitutional: Negative for fever and malaise/fatigue.  Cardiovascular: Positive for palpitations. Negative for chest pain.  Gastrointestinal: Positive for constipation. Negative for nausea.  Skin: Negative for rash.  Neurological: Negative for tremors, focal weakness and loss of consciousness.  Psychiatric/Behavioral: Positive for depression. Negative for suicidal ideas, hallucinations and substance abuse. The patient is nervous/anxious. The patient does not have insomnia.      Filed Vitals:   02/03/14 1059  BP: 110/76  Pulse: 65  Height: 5\' 9"  (1.753 m)  Weight: 365 lb (165.563 kg)    Physical Exam  Constitutional: No distress.  Obese  Skin: He is not diaphoretic.  Musculoskeletal: Gait & Station: normal Patient leans: N/A  Traumatic Brain Injury: Played college football.   Past Psychiatric History: Reviewed   Past Medical History: Reviewed  Past Medical History  Diagnosis Date  . Paroxysmal atrial fibrillation   . Lymphadenopathy   . Testosterone deficiency   . Anemia   . Vitamin D deficiency   . ED (erectile dysfunction)   . Morbid obesity   .  OSA (obstructive sleep apnea)     CPAP-17.5 cm water pressure  . Kidney stones     stent, lithotripsy  . Diabetes mellitus     controlled  . Gout   . S/P emergency tracheotomy for assistance in  breathing     at age 89  . Pneumonia     History of Loss of Consciousness: Yes-with afib  Seizure History: No  Cardiac History: Yes-Afib   Allergies:  Allergies  Allergen Reactions  . Colchicine Shortness Of Breath, Swelling and Itching  . Peach Flavor     "Peaches" " swelling of face"     Current Medications: Current Outpatient Prescriptions on File Prior to Visit  Medication Sig Dispense Refill  . allopurinol (ZYLOPRIM) 100 MG tablet TAKE 1 TABLET BY MOUTH EVERY DAY  30 tablet  3  . AMBULATORY NON FORMULARY MEDICATION Cyclobenzaprine 2% gabapentin 3% Baclofen 2%  Diclofenac 4% Qty# 120 ml Sig: apply small amount topically to effected area 2-3 times a day as directed 2 refills      . AMBULATORY NON FORMULARY MEDICATION Medication Name: CPAP      . amiodarone (PACERONE) 100 MG tablet Take 1 tablet (100 mg total) by mouth 2 (two) times daily.  180 tablet  3  . AMRIX 30 MG 24 hr capsule Take 30 mg by mouth as needed for muscle spasms.       Marland Kitchen. aspirin EC 81 MG tablet Take 1 tablet (81 mg total) by mouth daily.  90 tablet  3  . BELSOMRA 10 MG TABS Take by mouth at bedtime as needed (Insomnia).       Marland Kitchen. diltiazem (CARDIZEM CD) 240 MG 24 hr capsule Take 1 capsule (240 mg total) by mouth daily.  90 capsule  3  . escitalopram (LEXAPRO) 10 MG tablet Take 1 tablet (10 mg total) by mouth as directed. Take 20 mg QAM, 10 mg at 4 PM, 10 mg at 12 AM.  120 tablet  1  . ferrous fumarate-iron polysaccharide complex (TANDEM) 162-115.2 MG CAPS Take 1 capsule by mouth.      Marland Kitchen. GLUCOSAMINE-CHONDROITIN PO Take 1 tablet by mouth daily.      Marland Kitchen. HYDROcodone-acetaminophen (NORCO) 10-325 MG per tablet Take 1 tablet by mouth every 8 (eight) hours as needed.  90 tablet  0  . Iron-FA-B Cmp-C-Biot-Probiotic (FUSION PLUS) CAPS Take 2 capsules by mouth daily.  60 capsule  6  . lisinopril (PRINIVIL,ZESTRIL) 5 MG tablet Take 1 tablet (5 mg total) by mouth daily. Take a half (5mg ) tablet daily.  90 tablet  1  .  LORazepam (ATIVAN) 1 MG tablet TAKE 1 TABLET BY MOUTH EVERY 8 HOURS  90 tablet  0  . metFORMIN (GLUCOPHAGE) 500 MG tablet Take 1 tablet (500 mg total) by mouth as needed.  30 tablet  3  . Multiple Vitamin (THERA) TABS Take 1 tablet by mouth.      . Oxycodone HCl 10 MG TABS Take 1 tablet by mouth daily.      . pantoprazole (PROTONIX) 40 MG tablet Take 40 mg by mouth daily.       . pravastatin (PRAVACHOL) 40 MG tablet Take 40 mg by mouth at bedtime. Patient has not started this yet (06/08/13)      . Testosterone (ANDROGEL PUMP) 20.25 MG/ACT (1.62%) GEL PLACE 4 PUMPS ON SKIN EVERY MORNING  150 g  5  . tiZANidine (ZANAFLEX) 4 MG tablet Take 4 mg by mouth once.       .Marland Kitchen  Vitamin D, Ergocalciferol, (DRISDOL) 50000 UNITS CAPS capsule Take 50,000 Units by mouth daily.       Marland Kitchen zolpidem (AMBIEN) 10 MG tablet Take 10 mg by mouth at bedtime.        No current facility-administered medications on file prior to visit.      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    History   Social History  . Marital Status: Married    Spouse Name: N/A    Number of Children: N/A  . Years of Education: N/A   Social History Main Topics  . Smoking status: Never Smoker   . Smokeless tobacco: Not on file  . Alcohol Use: No  . Drug Use: No     Comment: prior cocaine abuse (heavy) in his 68s.  denies subsequent use  . Sexual Activity: Yes    Partners: Female    Copy: None     Comment: on full disability, separated, no regular exercise,walks some, drinks pot of coffee a day.   Other Topics Concern  . Not on file   Social History Narrative   Drinks one pot of  coffee daily.   Regular exercise-no, walks some      Lives in [place] with roommate.   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    Caffeine: Caffeinated Beverages 1 5 hour energy   Family History: Reviewed  Family History  Problem Relation Age of Onset  . Cancer Mother     lung, heavy smoker  . Hypertension Mother   . Hyperlipidemia Mother   . Cancer Father     melanoma  . Aneurysm Father     cardiac  . Hyperlipidemia Father   . Hypertension Father   . Hyperlipidemia Sister   . Hypertension Sister   . Hyperlipidemia Brother   . Hypertension Brother   . Stroke Other     Psychiatric Specialty Examination: Objective: Appearance: Casual   Eye Contact:: Good   Speech: Clear and Coherent and Normal Rate   Volume: Normal   Mood: somewhat dysphoric but not hopeless.   Affect: Appropriate, Congruent and Full Range   Thought Process: Coherent, Linear and Logical   Orientation: Full   Thought Content: WDL   Suicidal Thoughts: No   Homicidal Thoughts: No   Judgement: Fair   Insight: Good   Psychomotor Activity: Normal   Akathisia: Yes   Handed: Right   Memory-immediate 3/3; recent-2/3  Fund of knowledge-average to above average  Language-Intact  AIMS (if indicated): None   Assets: Communication Skills  Desire for Improvement  Financial Resources/Insurance  Physical Health    Laboratory/X-Ray  Psychological Evaluation(s)   none  Not available    Assessment:  AXIS I  Major Depression, Recurrent severe-improving , 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  Atrial fibrillation     .  Lymphadenopathy     .  Testosterone deficiency     .  Anemia     .  Vitamin d deficiency     .  ED (erectile dysfunction)     .  Morbid obesity     .  OSA (obstructive sleep apnea)       CPAP-17.5 cm water pressure    .  Kidney stones       stent, lithotripsy    .   Lymphadenopathy     .  Diabetes mellitus       controlled    .  Testosterone deficiency     .  Excess or deficiency of vitamin D     .  ED (erectile dysfunction)       AXIS IV  economic problems, educational problems, occupational problems and problems with primary support group   AXIS V  60-moderate symptoms    Treatment Plan/Recommendations:  PLAN:  Continue Lexapro at the current dose. Gets that from Dr. Linford ArnoldMetheney. History of polysubstance dependence which is in sustained full remission as of now. He keeps himself active.  Because of his concern of  Down days and dysphoria. We will augment with abilify. Small dose of 5mg  at his own request and have reviewed side effects, risks and concern. Including cardiac concern, tremors, weight gain. Cautioned and recommended to closely follow with his providers. Follow up in 4 weeks  call 911 for  Any urgency.   Time Spent: 25 minutes  Thresa RossNADEEM Paislynn Hegstrom, M.D.  02/03/2014 11:23 AM

## 2014-02-09 ENCOUNTER — Other Ambulatory Visit: Payer: Self-pay

## 2014-02-09 MED ORDER — METFORMIN HCL 500 MG PO TABS: 500.0000 mg | ORAL_TABLET | ORAL | Status: DC | PRN

## 2014-02-17 ENCOUNTER — Encounter (INDEPENDENT_AMBULATORY_CARE_PROVIDER_SITE_OTHER): Payer: Self-pay

## 2014-02-17 ENCOUNTER — Ambulatory Visit (INDEPENDENT_AMBULATORY_CARE_PROVIDER_SITE_OTHER): Payer: BC Managed Care – PPO | Admitting: Licensed Clinical Social Worker

## 2014-02-17 DIAGNOSIS — F331 Major depressive disorder, recurrent, moderate: Secondary | ICD-10-CM

## 2014-02-18 NOTE — Progress Notes (Signed)
   THERAPIST PROGRESS NOTE  Session Time: [id number]  Participation Level: Active  Behavioral Response: CasualAlertDysphoric  Type of Therapy: Individual Therapy  Treatment Goals addressed: Anger  Interventions: Motivational Interviewing  Summary:   Rodney Bright met with therapist for the first time.  He presented as somewhat controlling as he guided the discussion.  There were several times when he asked the therapist questions about her background and twice he asked for her opinion regarding medications he is taking.  Therapist clarified that she is not qualified to answer questions about the medications.   Pt spent much of the session focused on issues from his past when he was growing up.  He indicated that he feels as though unpleasant memories from his past continue to affect him today.  Pt admitted that he can come across as "intense" and can be difficult to work with.  Suicidal/Homicidal: Nowithout intent/plan  Therapist Response: Therapist gathered information about MH concerns, treatment history, and current life stressors.  She asked pt why he was seeking therapy at this time.  Suggested a goal for his treatment plan.  Plan: Return again in approximately 2 weeks.  Diagnosis: Axis I: Major Depressive Disorder, recurrent, moderate        Armandina Stammer 02/18/2014

## 2014-02-25 ENCOUNTER — Telehealth (HOSPITAL_COMMUNITY): Payer: Self-pay | Admitting: *Deleted

## 2014-02-25 NOTE — Telephone Encounter (Signed)
Return pt phone call. Per Dr. Gilmore LarocheAkhtar, inform pt to discontinue Abilify. If pt has any questions, contact office. PT states and shows understanding.

## 2014-02-25 NOTE — Telephone Encounter (Signed)
PT called stating he becoming more angry when he start taking Abilify. Pt would like speak with Dr.Akhtar.  Please return call [phone] or [phone].

## 2014-03-01 ENCOUNTER — Telehealth: Payer: Self-pay | Admitting: *Deleted

## 2014-03-01 MED ORDER — INDOMETHACIN 50 MG PO CAPS: 50.0000 mg | ORAL_CAPSULE | Freq: Three times a day (TID) | ORAL | Status: DC | PRN

## 2014-03-01 NOTE — Telephone Encounter (Signed)
Pt called and reports that he is experiencing a gout flare and would like something to help with the flare. rx sent pt informed.[name], Harlei Lehrmann AldertonLynetta

## 2014-03-10 ENCOUNTER — Ambulatory Visit (INDEPENDENT_AMBULATORY_CARE_PROVIDER_SITE_OTHER): Payer: BC Managed Care – PPO | Admitting: Psychiatry

## 2014-03-10 ENCOUNTER — Encounter (INDEPENDENT_AMBULATORY_CARE_PROVIDER_SITE_OTHER): Payer: Self-pay

## 2014-03-10 ENCOUNTER — Encounter (HOSPITAL_COMMUNITY): Payer: Self-pay | Admitting: Psychiatry

## 2014-03-10 VITALS — BP 132/67 | HR 61 | Ht 69.0 in | Wt 365.0 lb

## 2014-03-10 DIAGNOSIS — F411 Generalized anxiety disorder: Secondary | ICD-10-CM

## 2014-03-10 DIAGNOSIS — F1921 Other psychoactive substance dependence, in remission: Secondary | ICD-10-CM

## 2014-03-10 DIAGNOSIS — F331 Major depressive disorder, recurrent, moderate: Secondary | ICD-10-CM

## 2014-03-10 DIAGNOSIS — F332 Major depressive disorder, recurrent severe without psychotic features: Secondary | ICD-10-CM

## 2014-03-10 MED ORDER — LORAZEPAM 1 MG PO TABS: ORAL_TABLET | ORAL | Status: DC

## 2014-03-10 MED ORDER — ARIPIPRAZOLE 2 MG PO TABS: 2.0000 mg | ORAL_TABLET | Freq: Every day | ORAL | Status: DC

## 2014-03-10 NOTE — Progress Notes (Signed)
Patient ID: Rodney RodriguezWilliam J Bright, male   DOB: 07/08/1960, 53 y.o.   MRN: [id number]   [facility] Follow[facility]  Rodney RodriguezWilliam J Bright 11/26/1960  Date: 12/02/2013  HPI Comments: Rodney Bright is a 53 y/o male with a past psychiatric history significant for symptoms of depression and anxiety. The patient is referred for psychiatric services for medication management.   . Location- on lexapro. 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 Last visit we started Abilify 5 mg it did help his depression he was feeling somewhat better but apparently started feeling somewhat agitated and anxious so he wanted stop it. But he does agree that Abilify did help his mood symptoms. He is somewhat worried about the holiday season and difficult relationship with his sister who allowed in the past his ex-wife to live with her.   Quality: The patient continues to take lexapro 20, 10, 10 mg.  patient closely follows with cardiologist because of his atrial fibrillation. In past he was  admitted in hospital because of polyps oozing blood and he is blood count was very low. He has had 9 pints of transfusion.    But considering his anxiety level and atrial fibrillation he can continue a reasonable or manageable dose of Ativan that helps him. 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  . Severity: Depression: 6/10 (0=Very depressed; 5=Neutral; 10=Very Happy)  Anxiety- 4/10 (0=no anxiety; 5= moderate/tolerable anxiety; 10= panic attacks) . Duration: Patient has had symptoms over 20 years. . Timing: Anxiety worst at nght. . Context-Patient reports that his mood is worse during the winter. . Modifying factors- Improved with Therapy and animals.  Worsens with family and work stressor.  . Associated signs and symptoms: In the area of affective symptoms,  patient appears somewhat dysphoric. Patient denies current suicidal ideation, intent, or plan. Patient denies current homicidal ideation, intent, or plan. Patient denies auditory hallucinations. Patient denies visual hallucinations. Patient denies symptoms of paranoia. Patient states sleep is good, but reports he sleeps 8-9 hours. Appetite is good. Energy level is fair to good. Patient denies symptoms of anhedonia.      Review of Systems  Constitutional: Negative for fever.  Eyes: Negative for blurred vision.  Respiratory: Negative for cough.   Cardiovascular: Positive for palpitations. Negative for chest pain.  Gastrointestinal: Negative for nausea.  Skin: Negative for rash.  Neurological: Negative for tremors, focal weakness and loss of consciousness.  Psychiatric/Behavioral: Positive for depression. Negative for suicidal ideas, hallucinations and substance abuse. The patient is nervous/anxious. The patient does not have insomnia.      Filed Vitals:   03/10/14 1042  BP: 132/67  Pulse: 61  Height: 5\' 9"  (1.753 m)  Weight: 365 lb (165.563 kg)    Physical Exam  Constitutional: No distress.  Obese  Skin: He is not diaphoretic.  Musculoskeletal: Gait & Station: normal Patient leans: N/A  Traumatic Brain Injury: Played college football.   Past Psychiatric History: Reviewed   Past Medical History: Reviewed  Past Medical History  Diagnosis Date  . Paroxysmal atrial fibrillation   . Lymphadenopathy   . Testosterone deficiency   . Anemia   . Vitamin D deficiency   . ED (erectile dysfunction)   . Morbid obesity   . OSA (obstructive sleep apnea)     CPAP-17.5 cm water pressure  . Kidney stones  stent, lithotripsy  . Diabetes mellitus     controlled  . Gout   . S/P emergency tracheotomy for assistance in breathing     at age 89  . Pneumonia     History of Loss of Consciousness: Yes-with afib  Seizure History: No  Cardiac History: Yes-Afib   Allergies:  Allergies   Allergen Reactions  . Colchicine Shortness Of Breath, Swelling and Itching  . Peach Flavor     "Peaches" " swelling of face"     Current Medications: Current Outpatient Prescriptions on File Prior to Visit  Medication Sig Dispense Refill  . allopurinol (ZYLOPRIM) 100 MG tablet TAKE 1 TABLET BY MOUTH EVERY DAY 30 tablet 3  . AMBULATORY NON FORMULARY MEDICATION Cyclobenzaprine 2% gabapentin 3% Baclofen 2%  Diclofenac 4% Qty# 120 ml Sig: apply small amount topically to effected area 2-3 times a day as directed 2 refills    . AMBULATORY NON FORMULARY MEDICATION Medication Name: CPAP    . amiodarone (PACERONE) 100 MG tablet Take 1 tablet (100 mg total) by mouth 2 (two) times daily. 180 tablet 3  . AMRIX 30 MG 24 hr capsule Take 30 mg by mouth as needed for muscle spasms.     Marland Kitchen. aspirin EC 81 MG tablet Take 1 tablet (81 mg total) by mouth daily. 90 tablet 3  . BELSOMRA 10 MG TABS Take by mouth at bedtime as needed (Insomnia).     Marland Kitchen. diltiazem (CARDIZEM CD) 240 MG 24 hr capsule Take 1 capsule (240 mg total) by mouth daily. 90 capsule 3  . escitalopram (LEXAPRO) 10 MG tablet Take 1 tablet (10 mg total) by mouth as directed. Take 20 mg QAM, 10 mg at 4 PM, 10 mg at 12 AM. 120 tablet 1  . ferrous fumarate-iron polysaccharide complex (TANDEM) 162-115.2 MG CAPS Take 1 capsule by mouth.    Marland Kitchen. GLUCOSAMINE-CHONDROITIN PO Take 1 tablet by mouth daily.    Marland Kitchen. HYDROcodone-acetaminophen (NORCO) 10-325 MG per tablet Take 1 tablet by mouth every 8 (eight) hours as needed. 90 tablet 0  . indomethacin (INDOCIN) 50 MG capsule Take 1 capsule (50 mg total) by mouth 3 (three) times daily as needed. For gout flares 60 capsule 1  . Iron-FA-B Cmp-C-Biot-Probiotic (FUSION PLUS) CAPS Take 2 capsules by mouth daily. 60 capsule 6  . lisinopril (PRINIVIL,ZESTRIL) 5 MG tablet Take 1 tablet (5 mg total) by mouth daily. Take a half (5mg ) tablet daily. 90 tablet 1  . metFORMIN (GLUCOPHAGE) 500 MG tablet Take 1 tablet (500 mg  total) by mouth as needed. 30 tablet 0  . Multiple Vitamin (THERA) TABS Take 1 tablet by mouth.    . Oxycodone HCl 10 MG TABS Take 1 tablet by mouth daily.    . pantoprazole (PROTONIX) 40 MG tablet Take 40 mg by mouth daily.     . pravastatin (PRAVACHOL) 40 MG tablet Take 40 mg by mouth at bedtime. Patient has not started this yet (06/08/13)    . Testosterone (ANDROGEL PUMP) 20.25 MG/ACT (1.62%) GEL PLACE 4 PUMPS ON SKIN EVERY MORNING 150 g 5  . tiZANidine (ZANAFLEX) 4 MG tablet Take 4 mg by mouth once.     . Vitamin D, Ergocalciferol, (DRISDOL) 50000 UNITS CAPS capsule Take 50,000 Units by mouth daily.     Marland Kitchen. zolpidem (AMBIEN) 10 MG tablet Take 10 mg by mouth at bedtime.      No current facility-administered medications on file prior to visit.      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    History   Social History  . Marital Status: Married    Spouse Name: N/A    Number of Children: N/A  . Years of Education: N/A   Social History Main Topics  . Smoking status: Never Smoker   . Smokeless tobacco: None  . Alcohol Use: No  . Drug Use: No     Comment: prior cocaine abuse (heavy) in his 19s.  denies subsequent use  . Sexual Activity:    Partners: Female    Copy: None     Comment: on full disability, separated, no regular exercise,walks some, drinks pot of coffee a day.   Other Topics Concern  . None   Social History Narrative   Drinks one pot of coffee daily.   Regular exercise-no, walks some      Lives in [place] with roommate.   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    Caffeine:  Caffeinated Beverages 1 5 hour energy   Family History: Reviewed  Family History  Problem Relation Age of Onset  . Cancer Mother     lung, heavy smoker  . Hypertension Mother   . Hyperlipidemia Mother   . Cancer Father     melanoma  . Aneurysm Father     cardiac  . Hyperlipidemia Father   . Hypertension Father   . Hyperlipidemia Sister   . Hypertension Sister   . Hyperlipidemia Brother   . Hypertension Brother   . Stroke Other     Psychiatric Specialty Examination: Objective: Appearance: Casual   Eye Contact:: Good   Speech: Clear and Coherent and Normal Rate   Volume: Normal   Mood: somewhat dysphoric but not hopeless.   Affect: Appropriate, Congruent and Full Range   Thought Process: Coherent, Linear and Logical   Orientation: Full   Thought Content: WDL   Suicidal Thoughts: No   Homicidal Thoughts: No   Judgement: Fair   Insight: Good   Psychomotor Activity: Normal   Akathisia: Yes   Handed: Right   Memory-immediate 3/3; recent-2/3  Fund of knowledge-average to above average  Language-Intact  AIMS (if indicated): None   Assets: Communication Skills  Desire for Improvement  Financial Resources/Insurance  Physical Health    Laboratory/X-Ray  Psychological Evaluation(s)   none  Not available    Assessment:  AXIS I  Major Depression, Recurrent severe-improving , 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  Atrial fibrillation     .  Lymphadenopathy     .  Testosterone deficiency     .  Anemia     .  Vitamin d deficiency     .  ED (erectile dysfunction)     .  Morbid obesity     .  OSA (obstructive sleep apnea)       CPAP-17.5 cm water pressure    .  Kidney stones       stent, lithotripsy    .  Lymphadenopathy     .  Diabetes mellitus       controlled    .  Testosterone deficiency     .  Excess or deficiency of vitamin D     .  ED (erectile dysfunction)        AXIS IV  economic problems, educational problems, occupational problems and problems with primary support group   AXIS V  60-moderate symptoms    Treatment Plan/Recommendations:  PLAN:  Re start abilify but at lower dose of 2mg  if he is able to tolerate we can increase. He will follow with therpaist in regard to his social issues. Ativan 1mg  tid prn. Advised not to increase and consider to lower the dose as it combine with pain meds would cause more dizziness and increased tolerance. Reviewed side effects and risks.  He says has been on ativan for long and cannot stop it.  Continue Lexapro at the current dose. Gets that from Dr. Linford Arnold. History of polysubstance dependence which is in sustained full remission as of now. He keeps himself active.   Cautioned and recommended to closely follow with his providers. Follow up in 6  weeks  call 911 for  Any urgency.   Time Spent: 25 minutes  Thresa Ross, M.D.  03/10/2014 10:59 AM

## 2014-03-14 ENCOUNTER — Other Ambulatory Visit: Payer: Self-pay

## 2014-03-14 DIAGNOSIS — F331 Major depressive disorder, recurrent, moderate: Secondary | ICD-10-CM

## 2014-03-14 MED ORDER — ESCITALOPRAM OXALATE 10 MG PO TABS: 10.0000 mg | ORAL_TABLET | ORAL | Status: DC

## 2014-03-17 ENCOUNTER — Ambulatory Visit (HOSPITAL_COMMUNITY): Payer: Self-pay | Admitting: Licensed Clinical Social Worker

## 2014-03-31 ENCOUNTER — Ambulatory Visit (INDEPENDENT_AMBULATORY_CARE_PROVIDER_SITE_OTHER): Payer: BC Managed Care – PPO | Admitting: Licensed Clinical Social Worker

## 2014-03-31 DIAGNOSIS — F331 Major depressive disorder, recurrent, moderate: Secondary | ICD-10-CM
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
## 2014-04-04 ENCOUNTER — Other Ambulatory Visit: Payer: Self-pay | Admitting: *Deleted

## 2014-04-04 MED ORDER — ALLOPURINOL 100 MG PO TABS: ORAL_TABLET | ORAL | Status: DC

## 2014-04-04 MED ORDER — INDOMETHACIN 50 MG PO CAPS: ORAL_CAPSULE | ORAL | Status: DC

## 2014-04-14 ENCOUNTER — Ambulatory Visit (INDEPENDENT_AMBULATORY_CARE_PROVIDER_SITE_OTHER): Payer: 59 | Admitting: Licensed Clinical Social Worker

## 2014-04-14 ENCOUNTER — Encounter (INDEPENDENT_AMBULATORY_CARE_PROVIDER_SITE_OTHER): Payer: Self-pay

## 2014-04-14 ENCOUNTER — Other Ambulatory Visit (HOSPITAL_COMMUNITY): Payer: Self-pay

## 2014-04-14 DIAGNOSIS — F331 Major depressive disorder, recurrent, moderate: Secondary | ICD-10-CM

## 2014-04-14 MED ORDER — ESCITALOPRAM OXALATE 10 MG PO TABS: 10.0000 mg | ORAL_TABLET | ORAL | Status: DC

## 2014-04-14 NOTE — Telephone Encounter (Signed)
PT needs a refill for Lexapro and Ativan. This was previously prescribed by Dr. Laury DeepPuthuvel. Pt is currently out of both medications.
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
## 2014-04-14 NOTE — Telephone Encounter (Signed)
Per Dr. Gilmore LarocheAkhtar, prescription sent to pharmacy for Lexapro.

## 2014-04-15 ENCOUNTER — Telehealth: Payer: Self-pay

## 2014-04-15 ENCOUNTER — Telehealth (HOSPITAL_COMMUNITY): Payer: Self-pay | Admitting: *Deleted

## 2014-04-15 DIAGNOSIS — F331 Major depressive disorder, recurrent, moderate: Secondary | ICD-10-CM

## 2014-04-15 DIAGNOSIS — F411 Generalized anxiety disorder: Secondary | ICD-10-CM

## 2014-04-15 MED ORDER — LORAZEPAM 1 MG PO TABS: ORAL_TABLET | ORAL | Status: DC

## 2014-04-15 NOTE — Telephone Encounter (Signed)
Rodney Bright complains of cough, congestion and post nasal drip for 3 weeks. Most has resolved but now he has right ear pain for 2 days. He was wanting ear drops called [facility] for the pain. I advised him since he has not been seen for this he would need to be seen. I recommended the urgent care. He said he was working until [employer] tonight and just wanted Rodney Bright to call something in.

## 2014-04-15 NOTE — Telephone Encounter (Signed)
Per Dr. Gilmore LarocheAkhtar, pt is authorized for 1 refill for Ativan 1mg , Qty 90. Pt has a follow up appt on 06/09/14. Phoned in prescription to pharmacy.

## 2014-04-17 NOTE — Telephone Encounter (Signed)
I agree. Needs OV.

## 2014-04-18 ENCOUNTER — Telehealth (HOSPITAL_COMMUNITY): Payer: Self-pay | Admitting: *Deleted

## 2014-04-18 NOTE — Telephone Encounter (Signed)
error 

## 2014-04-20 ENCOUNTER — Telehealth: Payer: Self-pay | Admitting: Family Medicine

## 2014-04-20 DIAGNOSIS — M549 Dorsalgia, unspecified: Secondary | ICD-10-CM

## 2014-04-20 MED ORDER — LISINOPRIL 5 MG PO TABS: 5.0000 mg | ORAL_TABLET | Freq: Every day | ORAL | Status: DC

## 2014-04-20 NOTE — Telephone Encounter (Signed)
Referral placed. Pt informed.Rodney Bright Rodney Bright  

## 2014-04-20 NOTE — Telephone Encounter (Signed)
Patient called request to know if he can have a referral to [facility]. Thanks

## 2014-04-28 ENCOUNTER — Ambulatory Visit (HOSPITAL_COMMUNITY): Payer: Self-pay | Admitting: Licensed Clinical Social Worker

## 2014-05-04 ENCOUNTER — Encounter: Payer: Self-pay | Admitting: Family Medicine

## 2014-05-04 DIAGNOSIS — Z0289 Encounter for other administrative examinations: Secondary | ICD-10-CM

## 2014-05-10 ENCOUNTER — Telehealth: Payer: Self-pay

## 2014-05-10 NOTE — Telephone Encounter (Signed)
Sent Prior Auth through [employer] my meds for androgel pump 20.25. Waiting on response - CF

## 2014-05-12 ENCOUNTER — Ambulatory Visit (HOSPITAL_COMMUNITY): Payer: Self-pay | Admitting: Licensed Clinical Social Worker

## 2014-05-12 ENCOUNTER — Telehealth: Payer: Self-pay

## 2014-05-12 NOTE — Telephone Encounter (Signed)
Received denial for Androgel on 05/11/2014. Sent back with labs showing necessity for this med. [facility] - CF

## 2014-05-26 ENCOUNTER — Ambulatory Visit (HOSPITAL_COMMUNITY): Payer: Self-pay | Admitting: Licensed Clinical Social Worker

## 2014-06-07 ENCOUNTER — Other Ambulatory Visit: Payer: Self-pay

## 2014-06-07 MED ORDER — PRAVASTATIN SODIUM 40 MG PO TABS: 40.0000 mg | ORAL_TABLET | Freq: Every day | ORAL | Status: DC

## 2014-06-09 ENCOUNTER — Ambulatory Visit (HOSPITAL_COMMUNITY): Payer: Self-pay | Admitting: Psychiatry

## 2014-06-16 ENCOUNTER — Ambulatory Visit (HOSPITAL_COMMUNITY): Payer: Self-pay | Admitting: Psychiatry

## 2014-06-22 ENCOUNTER — Ambulatory Visit (INDEPENDENT_AMBULATORY_CARE_PROVIDER_SITE_OTHER): Payer: Self-pay | Admitting: Family Medicine

## 2014-06-22 ENCOUNTER — Encounter: Payer: Self-pay | Admitting: Family Medicine

## 2014-06-22 ENCOUNTER — Other Ambulatory Visit: Payer: Self-pay | Admitting: *Deleted

## 2014-06-22 VITALS — BP 142/67 | HR 64 | Ht 69.0 in | Wt 376.0 lb

## 2014-06-22 DIAGNOSIS — J320 Chronic maxillary sinusitis: Secondary | ICD-10-CM

## 2014-06-22 DIAGNOSIS — Z1159 Encounter for screening for other viral diseases: Secondary | ICD-10-CM

## 2014-06-22 DIAGNOSIS — I480 Paroxysmal atrial fibrillation: Secondary | ICD-10-CM

## 2014-06-22 DIAGNOSIS — R109 Unspecified abdominal pain: Secondary | ICD-10-CM

## 2014-06-22 DIAGNOSIS — E119 Type 2 diabetes mellitus without complications: Secondary | ICD-10-CM

## 2014-06-22 DIAGNOSIS — G609 Hereditary and idiopathic neuropathy, unspecified: Secondary | ICD-10-CM

## 2014-06-22 DIAGNOSIS — Z0000 Encounter for general adult medical examination without abnormal findings: Secondary | ICD-10-CM

## 2014-06-22 DIAGNOSIS — Z125 Encounter for screening for malignant neoplasm of prostate: Secondary | ICD-10-CM

## 2014-06-22 DIAGNOSIS — Z114 Encounter for screening for human immunodeficiency virus [HIV]: Secondary | ICD-10-CM

## 2014-06-22 DIAGNOSIS — D649 Anemia, unspecified: Secondary | ICD-10-CM

## 2014-06-22 DIAGNOSIS — I10 Essential (primary) hypertension: Secondary | ICD-10-CM

## 2014-06-22 MED ORDER — ANTIPYRINE-BENZOCAINE 5.4-1.4 % OT SOLN: 3.0000 [drp] | OTIC | Status: DC | PRN

## 2014-06-22 MED ORDER — METFORMIN HCL 500 MG PO TABS: 500.0000 mg | ORAL_TABLET | ORAL | Status: DC | PRN

## 2014-06-22 MED ORDER — INDOMETHACIN 50 MG PO CAPS: ORAL_CAPSULE | ORAL | Status: DC

## 2014-06-22 MED ORDER — AMOXICILLIN-POT CLAVULANATE 875-125 MG PO TABS: 1.0000 | ORAL_TABLET | Freq: Two times a day (BID) | ORAL | Status: DC

## 2014-06-22 NOTE — Patient Instructions (Signed)
Keep up a regular exercise program and make sure you are eating a healthy diet Try to eat 4 servings of dairy a day, or if you are lactose intolerant take a calcium with vitamin D daily.  Your vaccines are up to date.   

## 2014-06-22 NOTE — Progress Notes (Signed)
Subjective:    Patient ID: Rodney Bright, male    DOB: 1961-02-10, 54 y.o.   MRN: [id number]  HPI Here for CPE -  He does have a couple of concerns today.  Right ear pain radiating into jaw and maxillary sinus area x 1 week. Had a URI right better this.  No fever or ear drainage. Has been using some ear drops for pain.  Has noticed some blood.    Thinks may have a hernia. Feels like the tissue is riping on the inside.  Says felt it about a year ago when was in the [facility]ospital with GI bleed.  Notices more when leans against something.  Hurts when turns over in bed.  Does have a vertical incision from hs bariatric surgery years ago the most of his discomfort is actually in the right lower quadrant and near the umbilicus.  Review of Systems   Comprehensive ROS is neg  BP 142/67 mmHg  Pulse 64  Ht  (1.753 m)  Wt 376 lb (170.552 kg)  BMI 55.50 kg/m2    Allergies  Allergen Reactions  . Colchicine Shortness Of Breath, Swelling and Itching  . Peach Flavor     "Peaches" " swelling of face"    Past Medical History  Diagnosis Date  . Paroxysmal atrial fibrillation   . Lymphadenopathy   . Testosterone deficiency   . Anemia   . Vitamin D deficiency   . ED (erectile dysfunction)   . Morbid obesity   . OSA (obstructive sleep apnea)     CPAP-17.5 cm water pressure  . Kidney stones     stent, lithotripsy  . Diabetes mellitus     controlled  . Gout   . S/P emergency tracheotomy for assistance in breathing     at age 78  . Pneumonia     Past Surgical History  Procedure Laterality Date  . Gastric bypass  2005    600 lbs prior to surgery  . Cholecystectomy    . Tonsillectomy    . Finger surgery      ulnar digital nerve and artery right index finger  . Cystoscopy      retrograde and double J catheter insertion.    History   Social History  . Marital Status: Married    Spouse Name: N/A  . Number of Children: N/A  . Years of Education: N/A   Occupational History  .  Not on file.   Social History Main Topics  . Smoking status: Never Smoker   . Smokeless tobacco: Not on file  . Alcohol Use: No  . Drug Use: No     Comment: prior cocaine abuse (heavy) in his 38s.  denies subsequent use  . Sexual Activity:    Partners: Female    Copy: None     Comment: on full disability, separated, no regular exercise,walks some, drinks pot of coffee a day.   Other Topics Concern  . Not on file   Social History Narrative   Drinks one pot of coffee daily.   Regular exercise-no, walks some      Lives in [place] with roommate.   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    Family History  Problem Relation Age of Onset  . Cancer Mother     lung, heavy smoker  . Hypertension Mother   . Hyperlipidemia Mother   . Cancer Father     melanoma  . Aneurysm Father  cardiac  . Hyperlipidemia Father   . Hypertension Father   . Hyperlipidemia Sister   . Hypertension Sister   . Hyperlipidemia Brother   . Hypertension Brother   . Stroke Other     Outpatient Encounter Prescriptions as of 06/22/2014  Medication Sig  . allopurinol (ZYLOPRIM) 100 MG tablet TAKE 1 TABLET BY MOUTH EVERY DAY  . AMBULATORY NON FORMULARY MEDICATION Cyclobenzaprine 2% gabapentin 3% Baclofen 2%  Diclofenac 4% Qty# 120 ml Sig: apply small amount topically to effected area 2-3 times a day as directed 2 refills  . AMBULATORY NON FORMULARY MEDICATION Medication Name: CPAP  . amiodarone (PACERONE) 200 MG tablet   . AMRIX 30 MG 24 hr capsule Take 30 mg by mouth as needed for muscle spasms.   Marland Kitchen. aspirin EC 81 MG tablet Take 1 tablet (81 mg total) by mouth daily.  Marland Kitchen. diltiazem (CARDIZEM CD) 240 MG 24 hr capsule Take 1 capsule (240 mg total) by mouth daily.  Marland Kitchen. escitalopram (LEXAPRO) 10 MG tablet Take 1 tablet (10 mg total) by mouth as directed. Take 20 mg QAM, 10 mg at 4 PM, 10 mg at 12 AM.  . ferrous fumarate-iron polysaccharide complex (TANDEM)  162-115.2 MG CAPS Take 1 capsule by mouth.  Marland Kitchen. GLUCOSAMINE-CHONDROITIN PO Take 1 tablet by mouth daily.  Marland Kitchen. HYDROcodone-acetaminophen (NORCO) 10-325 MG per tablet Take 1 tablet by mouth every 8 (eight) hours as needed.  . indomethacin (INDOCIN) 50 MG capsule For gout flares **OFFICE APPOINTMENT NEEDED FOR REFILLS**  . Iron-FA-B Cmp-C-Biot-Probiotic (FUSION PLUS) CAPS Take 2 capsules by mouth daily.  Marland Kitchen. lisinopril (PRINIVIL,ZESTRIL) 5 MG tablet Take 1 tablet (5 mg total) by mouth daily. Take a half (5mg ) tablet daily.  Marland Kitchen. LORazepam (ATIVAN) 1 MG tablet TAKE 1 TABLET BY MOUTH EVERY 8 HOURS  . metFORMIN (GLUCOPHAGE) 500 MG tablet Take 1 tablet (500 mg total) by mouth as needed.  . Multiple Vitamin (THERA) TABS Take 1 tablet by mouth.  . oxyCODONE (ROXICODONE) 15 MG immediate release tablet   . pantoprazole (PROTONIX) 40 MG tablet Take 40 mg by mouth daily.   . pravastatin (PRAVACHOL) 40 MG tablet Take 1 tablet (40 mg total) by mouth at bedtime. Patient has not started this yet (06/08/13)  . Testosterone (ANDROGEL PUMP) 20.25 MG/ACT (1.62%) GEL PLACE 4 PUMPS ON SKIN EVERY MORNING  . tiZANidine (ZANAFLEX) 4 MG tablet Take 4 mg by mouth once.   . Vitamin D, Ergocalciferol, (DRISDOL) 50000 UNITS CAPS capsule Take 50,000 Units by mouth daily.   Marland Kitchen. zolpidem (AMBIEN) 10 MG tablet Take 10 mg by mouth at bedtime.   . [DISCONTINUED] amiodarone (PACERONE) 100 MG tablet Take 1 tablet (100 mg total) by mouth 2 (two) times daily.  . [DISCONTINUED] ARIPiprazole (ABILIFY) 2 MG tablet Take 1 tablet (2 mg total) by mouth daily.  . [DISCONTINUED] BELSOMRA 10 MG TABS Take by mouth at bedtime as needed (Insomnia).   . [DISCONTINUED] Oxycodone HCl 10 MG TABS Take 1 tablet by mouth daily.           Objective:   Physical Exam  Constitutional: He is oriented to person, place, and time. He appears well-developed and well-nourished.  HENT:  Head: Normocephalic and atraumatic.  Right Ear: External ear normal.  Left Ear:  External ear normal.  Nose: Nose normal.  Mouth/Throat: Oropharynx is clear and moist.  Eyes: Conjunctivae and EOM are normal. Pupils are equal, round, and reactive to light.  Neck: Normal range of motion. Neck supple. No thyromegaly present.  Cardiovascular: Normal rate, regular rhythm, normal heart sounds and intact distal pulses.   Pulmonary/Chest: Effort normal and breath sounds normal.  Abdominal: Soft. Bowel sounds are normal. He exhibits no distension and no mass. There is no tenderness. There is no rebound and no guarding.  Musculoskeletal: Normal range of motion.  Lymphadenopathy:    He has no cervical adenopathy.  Neurological: He is alert and oriented to person, place, and time. He has normal reflexes.  Skin: Skin is warm and dry.  Psychiatric: He has a normal mood and affect. His behavior is normal. Judgment and thought content normal.          Assessment & Plan:  Keep up a regular exercise program and make sure you are eating a healthy diet Try to eat 4 servings of dairy a day, or if you are lactose intolerant take a calcium with vitamin D daily.  Your vaccines are up to date.   screen for HIV and hepatitis C  Right maxillary sinusitis-  he mostly complains of ear pain radiating to the maxillary sinus and jaw. The ear itself looks completely clear but his symptoms did start right after upper respiratory infection and have persisted for a week to the point that he's been taking some old pain medication for relief. Recommend add treatment for acute sinusitis. Will treat with Augmentin. If not better in one week and give the office a call back.    Right lateral abdominal pain-I was unable to palpate any defect in the abdominal wall to suspect herniation. He has not had any changes in bowel or nausea etc.. It definitely seems positional. It is possible he just strained the muscle tissue. Gave him reassurance. Certainly if it persists, or if he feels like it's getting worse, or if  he notices a bulge that we can also consider getting a CT of the abdomen for further evaluation for herniation.  BMI 55 -  Encourage regular exercise and activity.   He does need to schedule follow-up in the next month for his diabetes  And blood pressure, etc. We did go ahead and add a hemoglobin A1c to his blood work today.

## 2014-06-23 NOTE — Addendum Note (Signed)
Addended by: Nani GasserMETHENEY, CATHERINE D on: 06/23/2014 12:15 PM   Modules accepted: Orders, Level of Service

## 2014-07-06 ENCOUNTER — Other Ambulatory Visit: Payer: Self-pay | Admitting: Family Medicine

## 2014-07-06 ENCOUNTER — Other Ambulatory Visit: Payer: Self-pay | Admitting: *Deleted

## 2014-07-06 ENCOUNTER — Telehealth (HOSPITAL_COMMUNITY): Payer: Self-pay | Admitting: *Deleted

## 2014-07-06 DIAGNOSIS — F411 Generalized anxiety disorder: Secondary | ICD-10-CM

## 2014-07-06 DIAGNOSIS — F331 Major depressive disorder, recurrent, moderate: Secondary | ICD-10-CM

## 2014-07-06 MED ORDER — LORAZEPAM 1 MG PO TABS: ORAL_TABLET | ORAL | Status: DC

## 2014-07-06 MED ORDER — LISINOPRIL 5 MG PO TABS: 5.0000 mg | ORAL_TABLET | Freq: Every day | ORAL | Status: DC

## 2014-07-06 MED ORDER — ESCITALOPRAM OXALATE 10 MG PO TABS: 10.0000 mg | ORAL_TABLET | ORAL | Status: DC

## 2014-07-06 NOTE — Telephone Encounter (Signed)
Pt called a refill for Lexapro 10mg  and Ativan 1mg .  Per Dr. Gilmore LarocheAkhtar, pt is authorized for a refill for Lexapro 10mg  ,Qty 60. Prescription was sent to pharmacy.   Per Dr . Gilmore LarocheAkhtar, please phone in pt's prescription for Ativan 1mg .  Spoke with Thayer Ohmhris from [facility].  Dr. Raul DelAkhtar,pt is authorized for a refill for Ativan 1mg , Qty 45.   Called and informed pt, prescriptions are only filled to appt date on 4/14. Pt verbally states and shows understanding.

## 2014-07-08 ENCOUNTER — Other Ambulatory Visit: Payer: Self-pay | Admitting: Family Medicine

## 2014-07-08 MED ORDER — TESTOSTERONE 50 MG/5GM (1%) TD GEL: 5.0000 g | Freq: Every day | TRANSDERMAL | Status: DC

## 2014-07-19 LAB — CBC WITH DIFFERENTIAL/PLATELET
Basophils Absolute: 0 10*3/uL (ref 0.0–0.1)
Basophils Relative: 0 % (ref 0–1)
Eosinophils Absolute: 0.1 10*3/uL (ref 0.0–0.7)
Eosinophils Relative: 1 % (ref 0–5)
HCT: 46.6 % (ref 39.0–52.0)
Hemoglobin: 14.9 g/dL (ref 13.0–17.0)
Lymphocytes Relative: 19 % (ref 12–46)
Lymphs Abs: 1.7 10*3/uL (ref 0.7–4.0)
MCH: 29 pg (ref 26.0–34.0)
MCHC: 32 g/dL (ref 30.0–36.0)
MCV: 90.7 fL (ref 78.0–100.0)
MPV: 11.1 fL (ref 8.6–12.4)
Monocytes Absolute: 0.6 10*3/uL (ref 0.1–1.0)
Monocytes Relative: 7 % (ref 3–12)
Neutro Abs: 6.5 10*3/uL (ref 1.7–7.7)
Neutrophils Relative %: 73 % (ref 43–77)
Platelets: 199 10*3/uL (ref 150–400)
RBC: 5.14 MIL/uL (ref 4.22–5.81)
RDW: 14.8 % (ref 11.5–15.5)
WBC: 8.9 10*3/uL (ref 4.0–10.5)

## 2014-07-19 LAB — LIPID PANEL
Cholesterol: 134 mg/dL (ref 0–200)
HDL: 29 mg/dL — ABNORMAL LOW (ref >=40)
LDL Cholesterol: 72 mg/dL (ref 0–99)
Total CHOL/HDL Ratio: 4.6 Ratio
Triglycerides: 166 mg/dL — ABNORMAL HIGH (ref <150)
VLDL: 33 mg/dL (ref 0–40)

## 2014-07-19 LAB — COMPLETE METABOLIC PANEL WITH GFR
ALT: 23 U/L (ref 0–53)
AST: 26 U/L (ref 0–37)
Albumin: 4 g/dL (ref 3.5–5.2)
Alkaline Phosphatase: 73 U/L (ref 39–117)
BUN: 19 mg/dL (ref 6–23)
CO2: 22 mEq/L (ref 19–32)
Calcium: 8.9 mg/dL (ref 8.4–10.5)
Chloride: 107 mEq/L (ref 96–112)
Creat: 1.42 mg/dL — ABNORMAL HIGH (ref 0.50–1.35)
GFR, Est African American: 63 mL/min
GFR, Est Non African American: 55 mL/min — ABNORMAL LOW
Glucose, Bld: 132 mg/dL — ABNORMAL HIGH (ref 70–99)
Potassium: 4.7 mEq/L (ref 3.5–5.3)
Sodium: 140 mEq/L (ref 135–145)
Total Bilirubin: 1 mg/dL (ref 0.2–1.2)
Total Protein: 6.5 g/dL (ref 6.0–8.3)

## 2014-07-19 LAB — HEMOGLOBIN A1C
Hgb A1c MFr Bld: 6.8 % — ABNORMAL HIGH (ref <5.7)
Mean Plasma Glucose: 148 mg/dL — ABNORMAL HIGH (ref <117)

## 2014-07-19 LAB — HIV ANTIBODY (ROUTINE TESTING W REFLEX): HIV 1&2 Ab, 4th Generation: NONREACTIVE

## 2014-07-19 LAB — PSA: PSA: 0.75 ng/mL (ref <=4.00)

## 2014-07-19 LAB — HEPATITIS C ANTIBODY: HCV Ab: NEGATIVE

## 2014-07-19 LAB — TESTOSTERONE: Testosterone: 224 ng/dL — ABNORMAL LOW (ref 300–890)

## 2014-07-20 ENCOUNTER — Telehealth: Payer: Self-pay | Admitting: Family Medicine

## 2014-07-20 ENCOUNTER — Ambulatory Visit (INDEPENDENT_AMBULATORY_CARE_PROVIDER_SITE_OTHER): Payer: Self-pay | Admitting: Family Medicine

## 2014-07-20 ENCOUNTER — Encounter: Payer: Self-pay | Admitting: Family Medicine

## 2014-07-20 VITALS — BP 115/68 | HR 65 | Ht 69.0 in | Wt 371.0 lb

## 2014-07-20 DIAGNOSIS — R3912 Poor urinary stream: Secondary | ICD-10-CM

## 2014-07-20 DIAGNOSIS — M109 Gout, unspecified: Secondary | ICD-10-CM

## 2014-07-20 DIAGNOSIS — E119 Type 2 diabetes mellitus without complications: Secondary | ICD-10-CM

## 2014-07-20 DIAGNOSIS — D509 Iron deficiency anemia, unspecified: Secondary | ICD-10-CM

## 2014-07-20 DIAGNOSIS — E291 Testicular hypofunction: Secondary | ICD-10-CM

## 2014-07-20 MED ORDER — ALLOPURINOL 100 MG PO TABS: ORAL_TABLET | ORAL | Status: DC

## 2014-07-20 MED ORDER — TESTOSTERONE 4 MG/24HR TD PT24: 1.0000 | MEDICATED_PATCH | Freq: Every day | TRANSDERMAL | Status: DC

## 2014-07-20 MED ORDER — METFORMIN HCL 500 MG PO TABS: 500.0000 mg | ORAL_TABLET | Freq: Two times a day (BID) | ORAL | Status: DC

## 2014-07-20 MED ORDER — FERROUS FUM-IRON POLYSACCH 162-115.2 MG PO CAPS: 1.0000 | ORAL_CAPSULE | Freq: Every day | ORAL | Status: DC

## 2014-07-20 NOTE — Telephone Encounter (Signed)
[facility] still denied androgel after the appeal the reason is that he has to try and fail or not be able to tolerate Testim gel or Androderm first. Dr. Linford ArnoldMetheney called androderm into pharmacy for patient to try. - CF

## 2014-07-20 NOTE — Progress Notes (Addendum)
   Subjective:    Patient ID: Rodney Bright, male    DOB: 09/02/1960, 54 y.o.   MRN: [id number]  HPI Diabetes - no hypoglycemic events. No wounds or sores that are not healing well. No increased thirst or urination. Checking glucose at home. Taking medications as prescribed without any side effects.  Gout - says has been out of the allopurinol for a few monthss.    Urinary stream is weak. Occ dysuria no hematuria. ays has been different since ran out of the allopurinol.   He also went over his recent lab results. He says he would like a prescription iron supplement and said something over-the-counter.  Review of Systems     Objective:   Physical Exam  Constitutional: He is oriented to person, place, and time. He appears well-developed and well-nourished.  HENT:  Head: Normocephalic and atraumatic.  Cardiovascular: Normal rate, regular rhythm and normal heart sounds.   Pulmonary/Chest: Effort normal and breath sounds normal.  Neurological: He is alert and oriented to person, place, and time.  Skin: Skin is warm and dry.  Psychiatric: He has a normal mood and affect. His behavior is normal.          Assessment & Plan:  DM- well controlled.  Will increase metformin to BID and f/U in 3 months. He is up-to-date on foot exam and eye exam. Continue to work on diet and exercise.   Lab Results  Component Value Date   HGBA1C 6.8* 07/18/2014   Hypogonadism - AndroGel is no longer going to be covered by his insurance. We try to get prior authorization was declined. They've recommended a trial of either testim gel or Androderm. Worsened her a prescription for 4 mg Androderm. Will need repeat testosterone testing at follow-up visit in 3 months.  Gout - Refill allopuroinol.   Iron deficiency-will write for a prescription iron. It may or may not be covered with insurance that he can certainly let us know.  Weak urinary stream with occasional dysuria-offer to do a urinalysis today. He  said he felt like he probably couldn't go. He feels that this is directly related to when he stopped the allopurinol so we will restart this and see if it makes a difference but I'm not sure how this would be related. Also consider could be enlarged prostate. If not improving after restart allopurinol then recommend collect urine specimen evaluate the prostate gland. He recently had a PSA which was completely normal.

## 2014-07-21 ENCOUNTER — Encounter (HOSPITAL_COMMUNITY): Payer: Self-pay | Admitting: Psychiatry

## 2014-07-21 ENCOUNTER — Telehealth: Payer: Self-pay | Admitting: Family Medicine

## 2014-07-21 ENCOUNTER — Ambulatory Visit (INDEPENDENT_AMBULATORY_CARE_PROVIDER_SITE_OTHER): Payer: 59 | Admitting: Psychiatry

## 2014-07-21 VITALS — BP 120/70 | HR 80 | Ht 69.0 in | Wt 371.0 lb

## 2014-07-21 DIAGNOSIS — F1921 Other psychoactive substance dependence, in remission: Secondary | ICD-10-CM

## 2014-07-21 DIAGNOSIS — F331 Major depressive disorder, recurrent, moderate: Secondary | ICD-10-CM

## 2014-07-21 DIAGNOSIS — F411 Generalized anxiety disorder: Secondary | ICD-10-CM

## 2014-07-21 MED ORDER — LORAZEPAM 1 MG PO TABS: ORAL_TABLET | ORAL | Status: DC

## 2014-07-21 MED ORDER — ESCITALOPRAM OXALATE 20 MG PO TABS: ORAL_TABLET | ORAL | Status: DC

## 2014-07-21 NOTE — Telephone Encounter (Signed)
Received fax for [place] on androderm patch. Called [employer] and initiated prior auth waiting on authorization. - CF

## 2014-07-21 NOTE — Progress Notes (Signed)
Patient ID: Rodney Bright, male   DOB: 1960-11-05, 54 y.o.   MRN: [id number]   [facility] Behavioral Health Follow-up Outpatient Visit  Rodney Bright 25-Apr-1960  Date: 12/02/2013  HPI Comments: Rodney Bright is a 54 y/o male with a past psychiatric history significant for symptoms of depression and anxiety. The patient is referred for psychiatric services for medication management.   . Location- on lexapro and ativan. Patient continues to do reasonable as of now regarding depression. He takes Ativan for the anxiety does does not feel comfortable cutting down the dose of Ativan. 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 He can change his dose of the Lexapro to 20 mg that of 10 mg tablets so that the total amount  the same 40 mg but he can take less tablets.  considering his anxiety level and atrial fibrillation he can continue a reasonable or manageable dose of Ativan that helps him. 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  . Severity: Depression: 6/10 (0=Very depressed; 5=Neutral; 10=Very Happy)  Anxiety- 4/10 (0=no anxiety; 5= moderate/tolerable anxiety; 10= panic attacks) . Duration: Patient has had symptoms over 20 years. . Timing: Anxiety worst at nght. . Context-Patient reports that his mood is worse during the winter. . Modifying factors- Improved with Therapy and animals.  Worsens with family and work stressor.  . Associated signs and symptoms: In the area of affective symptoms, patient appears somewhat dysphoric. Patient denies current suicidal ideation, intent, or plan. Patient denies current homicidal ideation, intent, or plan. Patient denies auditory hallucinations. Patient denies visual hallucinations. Patient denies symptoms of paranoia. Patient states sleep is good, but reports he sleeps 8-9  hours. Appetite is good. Energy level is fair to good. Patient denies symptoms of anhedonia.      Review of Systems  Constitutional: Negative.   Cardiovascular: Negative for palpitations.  Skin: Negative for rash.  Psychiatric/Behavioral: Negative for depression.     Filed Vitals:   07/21/14 1602  BP: 120/70  Pulse: 80  Height:  (1.753 m)  Weight: 371 lb (168.284 kg)    Physical Exam  Constitutional: No distress.  Obese  Skin: He is not diaphoretic.  Musculoskeletal: Gait & Station: normal Patient leans: N/A  Traumatic Brain Injury: Played college football.   Past Psychiatric History: Reviewed   Past Medical History: Reviewed  Past Medical History  Diagnosis Date  . Paroxysmal atrial fibrillation   . Lymphadenopathy   . Testosterone deficiency   . Anemia   . Vitamin D deficiency   . ED (erectile dysfunction)   . Morbid obesity   . OSA (obstructive sleep apnea)     CPAP-17.5 cm water pressure  . Kidney stones     stent, lithotripsy  . Diabetes mellitus     controlled  . Gout   . S/P emergency tracheotomy for assistance in breathing     at age 46  . Pneumonia     History of Loss of Consciousness: Yes-with afib  Seizure History: No  Cardiac History: Yes-Afib   Allergies:  Allergies  Allergen Reactions  . Colchicine Shortness Of Breath, Swelling and Itching  . Peach Flavor     "Peaches" " swelling of face"     Current Medications: Current Outpatient Prescriptions on File Prior to Visit  Medication Sig Dispense Refill  .  allopurinol (ZYLOPRIM) 100 MG tablet TAKE 1 TABLET BY MOUTH EVERY DAY 90 tablet 1  . AMBULATORY NON FORMULARY MEDICATION Medication Name: CPAP    . amiodarone (PACERONE) 200 MG tablet   3  . AMRIX 30 MG 24 hr capsule Take 30 mg by mouth as needed for muscle spasms.     Marland Kitchen aspirin EC 81 MG tablet Take 1 tablet (81 mg total) by mouth daily. 90 tablet 3  . diltiazem (CARDIZEM CD) 240 MG 24 hr capsule Take 1 capsule (240 mg total)  by mouth daily. 90 capsule 3  . ferrous fumarate-iron polysaccharide complex (TANDEM) 162-115.2 MG CAPS Take 1 capsule by mouth daily with breakfast. 30 capsule 11  . GLUCOSAMINE-CHONDROITIN PO Take 1 tablet by mouth daily.    Marland Kitchen HYDROcodone-acetaminophen (NORCO) 10-325 MG per tablet Take 1 tablet by mouth every 8 (eight) hours as needed. 90 tablet 0  . indomethacin (INDOCIN) 50 MG capsule For gout flares 60 capsule 1  . Iron-FA-B Cmp-C-Biot-Probiotic (FUSION PLUS) CAPS Take 2 capsules by mouth daily. 60 capsule 6  . lisinopril (PRINIVIL,ZESTRIL) 5 MG tablet Take 1 tablet (5 mg total) by mouth daily. 90 tablet 0  . metFORMIN (GLUCOPHAGE) 500 MG tablet Take 1 tablet (500 mg total) by mouth 2 (two) times daily with a meal. 180 tablet 3  . Multiple Vitamin (THERA) TABS Take 1 tablet by mouth.    . oxyCODONE (ROXICODONE) 15 MG immediate release tablet   0  . pantoprazole (PROTONIX) 40 MG tablet Take 40 mg by mouth daily.     . pravastatin (PRAVACHOL) 40 MG tablet Take 1 tablet (40 mg total) by mouth at bedtime. Patient has not started this yet (06/08/13) 30 tablet 7  . testosterone (ANDRODERM) 4 MG/24HR PT24 patch Place 1 patch onto the skin daily. 30 patch 3  . tiZANidine (ZANAFLEX) 4 MG tablet Take 4 mg by mouth once.     . Vitamin D, Ergocalciferol, (DRISDOL) 50000 UNITS CAPS capsule Take 50,000 Units by mouth daily.     Marland Kitchen zolpidem (AMBIEN) 10 MG tablet Take 10 mg by mouth at bedtime.      No current facility-administered medications on file prior to visit.      Previous Psychotropic Medications: Reviewed  Medication  Dose   lexapro-worked better   was on higher dose now at   Citalopram  upto 200 mg daily   Lorazepam    Zolpidem    Prozac-more anxious    Zoloft-didn't work     Substance Abuse History in the last 12 months: Reviewed  Patient reports he has used all types of drugs in his 20's specifically cocaine.    History   Social History  . Marital Status: Married    Spouse  Name: N/A  . Number of Children: N/A  . Years of Education: N/A   Social History Main Topics  . Smoking status: Never Smoker   . Smokeless tobacco: Not on file  . Alcohol Use: No  . Drug Use: No     Comment: prior cocaine abuse (heavy) in his 12s.  denies subsequent use  . Sexual Activity:    Partners: Female    Copy: None     Comment: on full disability, separated, no regular exercise,walks some, drinks pot of coffee a day.   Other Topics Concern  . None   Social History Narrative   Drinks one pot of coffee daily.   Regular exercise-no, walks some      L[place]  in [place] with roommate.   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    Caffeine: Caffeinated Beverages 1 5 hour energy   Family History: Reviewed  Family History  Problem Relation Age of Onset  . Cancer Mother     lung, heavy smoker  . Hypertension Mother   . Hyperlipidemia Mother   . Cancer Father     melanoma  . Aneurysm Father     cardiac  . Hyperlipidemia Father   . Hypertension Father   . Hyperlipidemia Sister   . Hypertension Sister   . Hyperlipidemia Brother   . Hypertension Brother   . Stroke Other     Psychiatric Specialty Examination: Objective: Appearance: Casual   Eye Contact:: Good   Speech: Clear and Coherent and Normal Rate   Volume: Normal   Mood: somewhat dysphoric at times. But enjoying his day off today  Affect: Appropriate, Congruent and Full Range   Thought Process: Coherent, Linear and Logical   Orientation: Full   Thought Content: WDL   Suicidal Thoughts: No   Homicidal Thoughts: No   Judgement: Fair   Insight: Good   Psychomotor Activity: Normal   Akathisia: Yes   Handed: Right   Memory-immediate 3/3; recent-2/3  Fund of knowledge-average to above average  Language-Intact  AIMS (if indicated): None   Assets: Communication Skills  Desire for Improvement  Financial Resources/Insurance  Physical Health     Laboratory/X-Ray  Psychological Evaluation(s)   none  Not available    Assessment:  AXIS I  Major Depression, Recurrent severe-improving , 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  Atrial fibrillation     .  Lymphadenopathy     .  Testosterone deficiency     .  Anemia     .  Vitamin d deficiency     .  ED (erectile dysfunction)     .  Morbid obesity     .  OSA (obstructive sleep apnea)       CPAP-17.5 cm water pressure    .  Kidney stones       stent, lithotripsy    .  Lymphadenopathy     .  Diabetes mellitus       controlled    .  Testosterone deficiency     .  Excess or deficiency of vitamin D     .  ED (erectile dysfunction)       AXIS IV  economic problems, educational problems, occupational problems and problems with primary support group   AXIS V  60-moderate symptoms    Treatment Plan/Recommendations:  PLAN:   Continue Lexapro now at a dose of 20 mg twice a day. Continue Ativan  Says he does not need therapy as of now. Keeps himself busy and is not feeling hopeless  He keeps himself active.   Cautioned and recommended to closely follow with his providers. Follow up in 6  weeks  call 911 for  Any urgency.     Thresa RossNADEEM Dayzee Trower, M.D.  07/21/2014 4:15 PM

## 2014-07-26 NOTE — Telephone Encounter (Signed)
Received denial for medication sent letter to appeal the decision - CF

## 2014-08-16 ENCOUNTER — Telehealth (HOSPITAL_COMMUNITY): Payer: Self-pay | Admitting: *Deleted

## 2014-08-16 DIAGNOSIS — F331 Major depressive disorder, recurrent, moderate: Secondary | ICD-10-CM

## 2014-08-16 MED ORDER — ESCITALOPRAM OXALATE 20 MG PO TABS
ORAL_TABLET | ORAL | Status: DC
Start: 2014-08-16 — End: 2014-11-17

## 2014-08-16 NOTE — Telephone Encounter (Signed)
Pt called for a refill for Lexapro 20mg . Per Dr. Gilmore LarocheAkhtar, pt is authorized for a 15 day refill of Lexapro 20mg , Qty 30. Prescription was sent to pharmacy.  Pt has f/u appt on 7/14.

## 2014-08-30 ENCOUNTER — Other Ambulatory Visit: Payer: Self-pay | Admitting: *Deleted

## 2014-08-30 MED ORDER — LISINOPRIL 5 MG PO TABS: 5.0000 mg | ORAL_TABLET | Freq: Every day | ORAL | Status: DC

## 2014-08-31 NOTE — Telephone Encounter (Signed)
[facility] and they have approved coverage for Androderm. - CF

## 2014-09-06 LAB — HM DIABETES EYE EXAM

## 2014-09-07 NOTE — Telephone Encounter (Signed)
error 

## 2014-09-12 ENCOUNTER — Telehealth: Payer: Self-pay | Admitting: Family Medicine

## 2014-09-12 DIAGNOSIS — H538 Other visual disturbances: Secondary | ICD-10-CM

## 2014-09-12 NOTE — Telephone Encounter (Signed)
Pt requesting a referral to [facility] in [place]  for a cloud on his eye.... Please put this info on his referral so they know where to send it.

## 2014-09-12 NOTE — Telephone Encounter (Signed)
Ok to place referral.

## 2014-09-14 NOTE — Telephone Encounter (Signed)
Referral placed.Rodney Bright  

## 2014-09-14 NOTE — Addendum Note (Signed)
Addended by: Deno EtienneBARKLEY, Areta Terwilliger L on: 09/14/2014 01:49 PM   Modules accepted: Orders

## 2014-09-15 ENCOUNTER — Other Ambulatory Visit: Payer: Self-pay | Admitting: *Deleted

## 2014-09-15 MED ORDER — PANTOPRAZOLE SODIUM 40 MG PO TBEC: 40.0000 mg | DELAYED_RELEASE_TABLET | Freq: Two times a day (BID) | ORAL | Status: DC

## 2014-09-19 ENCOUNTER — Other Ambulatory Visit: Payer: Self-pay | Admitting: *Deleted

## 2014-09-19 MED ORDER — DILTIAZEM HCL ER COATED BEADS 240 MG PO CP24: 240.0000 mg | ORAL_CAPSULE | Freq: Every day | ORAL | Status: DC

## 2014-10-20 ENCOUNTER — Ambulatory Visit (HOSPITAL_COMMUNITY): Payer: Self-pay | Admitting: Psychiatry

## 2014-10-20 ENCOUNTER — Ambulatory Visit: Payer: Self-pay | Admitting: Family Medicine

## 2014-10-31 ENCOUNTER — Ambulatory Visit (HOSPITAL_COMMUNITY): Payer: Self-pay | Admitting: Psychiatry

## 2014-11-02 ENCOUNTER — Ambulatory Visit: Payer: 59 | Admitting: Cardiology

## 2014-11-10 ENCOUNTER — Encounter: Payer: Self-pay | Admitting: Family Medicine

## 2014-11-17 ENCOUNTER — Telehealth (HOSPITAL_COMMUNITY): Payer: Self-pay | Admitting: *Deleted

## 2014-11-17 DIAGNOSIS — F331 Major depressive disorder, recurrent, moderate: Secondary | ICD-10-CM

## 2014-11-17 MED ORDER — ESCITALOPRAM OXALATE 20 MG PO TABS: ORAL_TABLET | ORAL | Status: DC

## 2014-11-17 NOTE — Telephone Encounter (Signed)
PT called for a refill for Lexapro. Per Dr. Gilmore Laroche, pt is authorized for a refill for Lexapro , Qty 15. Prescription was sent to [facility]. PT has a f/u appt on 11/28/14. Called and informed pt of prescription status. PT states and shows understanding.

## 2014-11-23 ENCOUNTER — Encounter: Payer: Self-pay | Admitting: Cardiology

## 2014-11-28 ENCOUNTER — Encounter (HOSPITAL_COMMUNITY): Payer: Self-pay | Admitting: Psychiatry

## 2014-11-28 ENCOUNTER — Ambulatory Visit (INDEPENDENT_AMBULATORY_CARE_PROVIDER_SITE_OTHER): Payer: 59 | Admitting: Psychiatry

## 2014-11-28 VITALS — BP 126/72 | HR 69 | Ht 69.0 in | Wt 341.0 lb

## 2014-11-28 DIAGNOSIS — F332 Major depressive disorder, recurrent severe without psychotic features: Secondary | ICD-10-CM

## 2014-11-28 DIAGNOSIS — F1921 Other psychoactive substance dependence, in remission: Secondary | ICD-10-CM

## 2014-11-28 DIAGNOSIS — F411 Generalized anxiety disorder: Secondary | ICD-10-CM

## 2014-11-28 DIAGNOSIS — G4700 Insomnia, unspecified: Secondary | ICD-10-CM

## 2014-11-28 DIAGNOSIS — F331 Major depressive disorder, recurrent, moderate: Secondary | ICD-10-CM

## 2014-11-28 MED ORDER — LORAZEPAM 1 MG PO TABS: ORAL_TABLET | ORAL | Status: DC

## 2014-11-28 MED ORDER — ESCITALOPRAM OXALATE 20 MG PO TABS: ORAL_TABLET | ORAL | Status: DC

## 2014-11-28 NOTE — Progress Notes (Signed)
Patient ID: Rodney Bright, male   DOB: 1960/07/25, 54 y.o.   MRN: [id number]   [facility] Behavioral Health Follow-up Outpatient Visit  Rodney Bright [birth year]  Date: 11/28/2014  HPI Comments: Rodney Bright is a 54 y/o male with a past psychiatric history significant for symptoms of depression and anxiety. The patient is referred for psychiatric services for medication management.   . Location- on lexapro and ativan. Insomnia: on ambien from other provider. Doing reasonable  Patient continues to do reasonable as of now regarding depression. He takes Ativan for the anxiety does does not feel comfortable cutting down the dose of Ativan. 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 He can change his dose of the Lexapro to 20 mg that of 10 mg tablets so that the total amount  the same 40 mg but he can take less tablets.  considering his anxiety level and atrial fibrillation he can continue a reasonable or manageable dose of Ativan that helps him. 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  . Severity: Depression: 6/10 (0=Very depressed; 5=Neutral; 10=Very Happy)  Anxiety- 4/10 (0=no anxiety; 5= moderate/tolerable anxiety; 10= panic attacks) . Duration: Patient has had symptoms over 20 years. . Timing: Anxiety worst at nght. . Context-Patient reports that his mood is worse during the winter. . Modifying factors- Improved with Therapy and animals.  Worsens with family and work stressor.  . Associated signs and symptoms: In the area of affective symptoms, patient appears somewhat dysphoric. Patient denies current suicidal ideation, intent, or plan. Patient denies current homicidal ideation, intent, or plan. Patient denies auditory hallucinations. Patient denies visual hallucinations. Patient denies symptoms of  paranoia. Patient states sleep is good, but reports he sleeps 8-9 hours. Appetite is good. Energy level is fair to good. Patient denies symptoms of anhedonia.      Review of Systems  Constitutional: Negative for fever.  Cardiovascular: Negative for palpitations.  Musculoskeletal: Positive for back pain.  Skin: Negative for rash.  Psychiatric/Behavioral: Negative for depression and substance abuse.     Filed Vitals:   11/28/14 1019  BP: 126/72  Pulse: 69  Height:  (1.753 m)  Weight: 341 lb (154.677 kg)  SpO2: 96%    Physical Exam  Constitutional: No distress.  Obese  Skin: He is not diaphoretic.  Musculoskeletal: Gait & Station: normal Patient leans: N/A  Traumatic Brain Injury: Played college football.   Past Psychiatric History: Reviewed   Past Medical History: Reviewed  Past Medical History  Diagnosis Date  . Paroxysmal atrial fibrillation   . Lymphadenopathy   . Testosterone deficiency   . Anemia   . Vitamin D deficiency   . ED (erectile dysfunction)   . Morbid obesity   . OSA (obstructive sleep apnea)     CPAP-17.5 cm water pressure  . Kidney stones     stent, lithotripsy  . Diabetes mellitus     controlled  . Gout   . S/P emergency tracheotomy for assistance in breathing     at age 58  . Pneumonia     History of Loss of Consciousness: Yes-with afib  Seizure History: No  Cardiac History: Yes-Afib   Allergies:  Allergies  Allergen Reactions  . Colchicine Shortness Of Breath, Swelling and Itching  . Peach Flavor     "Peaches" " swelling  of face"     Current Medications: Current Outpatient Prescriptions on File Prior to Visit  Medication Sig Dispense Refill  . allopurinol (ZYLOPRIM) 100 MG tablet TAKE 1 TABLET BY MOUTH EVERY DAY 90 tablet 1  . AMBULATORY NON FORMULARY MEDICATION Medication Name: CPAP    . amiodarone (PACERONE) 200 MG tablet   3  . AMRIX 30 MG 24 hr capsule Take 30 mg by mouth as needed for muscle spasms.     Marland Kitchen aspirin  EC 81 MG tablet Take 1 tablet (81 mg total) by mouth daily. 90 tablet 3  . diltiazem (CARDIZEM CD) 240 MG 24 hr capsule Take 1 capsule (240 mg total) by mouth daily. 90 capsule 1  . ferrous fumarate-iron polysaccharide complex (TANDEM) 162-115.2 MG CAPS Take 1 capsule by mouth daily with breakfast. 30 capsule 11  . GLUCOSAMINE-CHONDROITIN PO Take 1 tablet by mouth daily.    Marland Kitchen HYDROcodone-acetaminophen (NORCO) 10-325 MG per tablet Take 1 tablet by mouth every 8 (eight) hours as needed. 90 tablet 0  . indomethacin (INDOCIN) 50 MG capsule For gout flares 60 capsule 1  . Iron-FA-B Cmp-C-Biot-Probiotic (FUSION PLUS) CAPS Take 2 capsules by mouth daily. 60 capsule 6  . lisinopril (PRINIVIL,ZESTRIL) 5 MG tablet Take 1 tablet (5 mg total) by mouth daily. 90 tablet 1  . metFORMIN (GLUCOPHAGE) 500 MG tablet Take 1 tablet (500 mg total) by mouth 2 (two) times daily with a meal. 180 tablet 3  . Multiple Vitamin (THERA) TABS Take 1 tablet by mouth.    . oxyCODONE (ROXICODONE) 15 MG immediate release tablet   0  . pantoprazole (PROTONIX) 40 MG tablet Take 1 tablet (40 mg total) by mouth 2 (two) times daily. 60 tablet 3  . pravastatin (PRAVACHOL) 40 MG tablet Take 1 tablet (40 mg total) by mouth at bedtime. Patient has not started this yet (06/08/13) 30 tablet 7  . testosterone (ANDRODERM) 4 MG/24HR PT24 patch Place 1 patch onto the skin daily. 30 patch 3  . tiZANidine (ZANAFLEX) 4 MG tablet Take 4 mg by mouth once.     . Vitamin D, Ergocalciferol, (DRISDOL) 50000 UNITS CAPS capsule Take 50,000 Units by mouth daily.     Marland Kitchen zolpidem (AMBIEN) 10 MG tablet Take 10 mg by mouth at bedtime.      No current facility-administered medications on file prior to visit.      Previous Psychotropic Medications: Reviewed  Medication  Dose   lexapro-worked better   was on higher dose now at 40mg   Citalopram  upto 200 mg daily   Lorazepam    Zolpidem    Prozac-more anxious    Zoloft-didn't work     Substance Abuse  History in the last 12 months: Reviewed  Patient reports he has used all types of drugs in his 20's specifically cocaine.    Social History   Social History  . Marital Status: Married    Spouse Name: N/A  . Number of Children: N/A  . Years of Education: N/A   Social History Main Topics  . Smoking status: Never Smoker   . Smokeless tobacco: None  . Alcohol Use: No  . Drug Use: No     Comment: prior cocaine abuse (heavy) in his 44s.  denies subsequent use  . Sexual Activity:    Partners: Female    Copy: None     Comment: on full disability, separated, no regular exercise,walks some, drinks pot of coffee a day.   Other Topics Concern  .  None   Social History Narrative   Drinks one pot of coffee daily.   Regular exercise-no, walks some      Lives in [place] with roommate.   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    Caffeine: Caffeinated Beverages 1 5 hour energy   Family History: Reviewed  Family History  Problem Relation Age of Onset  . Cancer Mother     lung, heavy smoker  . Hypertension Mother   . Hyperlipidemia Mother   . Cancer Father     melanoma  . Aneurysm Father     cardiac  . Hyperlipidemia Father   . Hypertension Father   . Hyperlipidemia Sister   . Hypertension Sister   . Hyperlipidemia Brother   . Hypertension Brother   . Stroke Other     Psychiatric Specialty Examination: Objective: Appearance: Casual   Eye Contact:: Good   Speech: Clear and Coherent and Normal Rate   Volume: Normal   Mood: somewhat dysphoric at times. But enjoying his day off today  Affect: Appropriate, Congruent and Full Range   Thought Process: Coherent, Linear and Logical   Orientation: Full   Thought Content: WDL   Suicidal Thoughts: No   Homicidal Thoughts: No   Judgement: Fair   Insight: Good   Psychomotor Activity: Normal   Akathisia: Yes   Handed: Right   Memory-immediate 3/3; recent-2/3  Fund of  knowledge-average to above average  Language-Intact  AIMS (if indicated): None   Assets: Communication Skills  Desire for Improvement  Financial Resources/Insurance  Physical Health    Laboratory/X-Ray  Psychological Evaluation(s)   none  Not available    Assessment:  AXIS I  Major Depression, Recurrent severe-improving , 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 GAD. insomnia  AXIS II  No diagnosis   AXIS III  Past Medical History    Diagnosis  Date    .  Atrial fibrillation     .  Lymphadenopathy     .  Testosterone deficiency     .  Anemia     .  Vitamin d deficiency     .  ED (erectile dysfunction)     .  Morbid obesity     .  OSA (obstructive sleep apnea)       CPAP-17.5 cm water pressure    .  Kidney stones       stent, lithotripsy    .  Lymphadenopathy     .  Diabetes mellitus       controlled    .  Testosterone deficiency     .  Excess or deficiency of vitamin D     .  ED (erectile dysfunction)       AXIS IV  economic problems, educational problems, occupational problems and problems with primary support group   AXIS V  60-moderate symptoms    Treatment Plan/Recommendations:  PLAN:   Continue Lexapro now at a dose of 20 mg twice a day for depression and anxiety Continue Ativan for anxiety. Advised to take lower dose at times He takes [place] from other provider for insomnia. Says he does not need therapy as of now. Keeps himself busy and is not feeling hopeless  He keeps himself active.   Cautioned and recommended to closely follow with his providers. Follow up in 3 months. call 911 for  Any urgency or suicidal toughts.    Thresa Ross, M.D.  11/28/2014 10:26 AM

## 2014-12-13 ENCOUNTER — Other Ambulatory Visit: Payer: Self-pay | Admitting: Family Medicine

## 2014-12-13 MED ORDER — ALLOPURINOL 100 MG PO TABS: ORAL_TABLET | ORAL | Status: DC

## 2014-12-14 ENCOUNTER — Telehealth: Payer: Self-pay | Admitting: Family Medicine

## 2014-12-14 NOTE — Telephone Encounter (Signed)
Received fax for Androderm sent through cover my meds waiting on authorization. - CF

## 2014-12-20 ENCOUNTER — Other Ambulatory Visit: Payer: Self-pay | Admitting: Family Medicine

## 2014-12-30 ENCOUNTER — Telehealth (HOSPITAL_COMMUNITY): Payer: Self-pay | Admitting: *Deleted

## 2014-12-30 DIAGNOSIS — F411 Generalized anxiety disorder: Secondary | ICD-10-CM

## 2014-12-30 DIAGNOSIS — F331 Major depressive disorder, recurrent, moderate: Secondary | ICD-10-CM

## 2014-12-30 NOTE — Telephone Encounter (Signed)
Received fax from [facility] for a medication refill for Ativan . Spoke with [place] and informed pharmacy pt received a written prescription for Ativan  on 11/28/14.Marland Kitchen Per [place], pt has not brought in a prescription for Ativan since 10/05/14.   Called and spoke with pt. Pt states," he drop out prescription to pharmacy on 11/28/14. He has no clue what happen to prescription." Informed pt, I will have to speak with the doctor and will call him back. Pt verbalizes understanding.

## 2015-01-05 ENCOUNTER — Other Ambulatory Visit: Payer: Self-pay | Admitting: Family Medicine

## 2015-01-05 MED ORDER — LORAZEPAM 1 MG PO TABS: ORAL_TABLET | ORAL | Status: DC

## 2015-01-05 NOTE — Telephone Encounter (Signed)
Will give a 30 day supply . Patient to look for his prescription and call us back.

## 2015-01-05 NOTE — Telephone Encounter (Signed)
Per Dr. Gilmore Laroche, please phone in prescription for Ativan  to [facility]. Spoke with Denny Peon and gave verbal order for Ativan , #90, 0 additional refills. Pt has a f/u appt on 03/27/15. Called and informed pt of prescription status. Pt verbalizes understanding.

## 2015-01-05 NOTE — Telephone Encounter (Signed)
Pt call for a refill for Ativan . Pt states he has been out of medication since Saturday. Please call to advise @ [phone].

## 2015-01-06 ENCOUNTER — Other Ambulatory Visit: Payer: Self-pay

## 2015-01-06 MED ORDER — AMIODARONE HCL 200 MG PO TABS: 200.0000 mg | ORAL_TABLET | Freq: Two times a day (BID) | ORAL | Status: DC

## 2015-02-06 ENCOUNTER — Telehealth (HOSPITAL_COMMUNITY): Payer: Self-pay

## 2015-02-06 DIAGNOSIS — F411 Generalized anxiety disorder: Secondary | ICD-10-CM

## 2015-02-06 DIAGNOSIS — F331 Major depressive disorder, recurrent, moderate: Secondary | ICD-10-CM

## 2015-02-07 ENCOUNTER — Other Ambulatory Visit: Payer: Self-pay | Admitting: Family Medicine

## 2015-02-07 MED ORDER — PRAVASTATIN SODIUM 40 MG PO TABS: 40.0000 mg | ORAL_TABLET | Freq: Every day | ORAL | Status: DC

## 2015-02-13 MED ORDER — LORAZEPAM 1 MG PO TABS: ORAL_TABLET | ORAL | Status: DC

## 2015-02-13 NOTE — Telephone Encounter (Signed)
Received medication refill request from [facility] for Ativan 1mg . Per Dr. Gilmore LarocheAkhtar, please phone in prescription for Ativan 1mg , #90. Spoke with Carlann. Per Ines Bloomerarlann, pharmacy will notify pt once prescription is ready. Pt has a f/u appt on 03/27/15.

## 2015-02-27 ENCOUNTER — Ambulatory Visit (INDEPENDENT_AMBULATORY_CARE_PROVIDER_SITE_OTHER): Payer: 59 | Admitting: Family Medicine

## 2015-02-27 DIAGNOSIS — M545 Low back pain: Secondary | ICD-10-CM

## 2015-02-27 NOTE — Progress Notes (Signed)
No show

## 2015-03-06 ENCOUNTER — Other Ambulatory Visit: Payer: Self-pay | Admitting: *Deleted

## 2015-03-06 MED ORDER — AMIODARONE HCL 200 MG PO TABS: 200.0000 mg | ORAL_TABLET | Freq: Two times a day (BID) | ORAL | Status: DC

## 2015-03-17 ENCOUNTER — Other Ambulatory Visit: Payer: Self-pay | Admitting: Family Medicine

## 2015-03-20 ENCOUNTER — Telehealth (HOSPITAL_COMMUNITY): Payer: Self-pay | Admitting: *Deleted

## 2015-03-20 DIAGNOSIS — F411 Generalized anxiety disorder: Secondary | ICD-10-CM

## 2015-03-20 DIAGNOSIS — F331 Major depressive disorder, recurrent, moderate: Secondary | ICD-10-CM

## 2015-03-20 MED ORDER — LORAZEPAM 1 MG PO TABS: ORAL_TABLET | ORAL | Status: DC

## 2015-03-20 NOTE — Telephone Encounter (Signed)
Received medication request from [facility] for Ativan 1mg . Per Dr. Gilmore LarocheAkhtar, pt is authorized for a refill for Ativan 1mg , #90. Please phone in prescription to pharmacy. Gave verbal order to [place] D at [facility]. Pharmacy will notify pt once prescription is ready for pick up.  Pt has a f/u appt on 04/24/15. Called and informed pt of prescription status. Pt verbalizes understanding.

## 2015-03-24 ENCOUNTER — Other Ambulatory Visit: Payer: Self-pay

## 2015-03-24 MED ORDER — TESTOSTERONE 4 MG/24HR TD PT24: 1.0000 | MEDICATED_PATCH | Freq: Every day | TRANSDERMAL | Status: DC

## 2015-03-24 NOTE — Telephone Encounter (Signed)
Androderm called in,patient needs appointment for further refills.

## 2015-03-27 ENCOUNTER — Ambulatory Visit (HOSPITAL_COMMUNITY): Payer: Self-pay | Admitting: Psychiatry

## 2015-04-13 ENCOUNTER — Telehealth: Payer: Self-pay | Admitting: *Deleted

## 2015-04-13 DIAGNOSIS — Z1329 Encounter for screening for other suspected endocrine disorder: Secondary | ICD-10-CM

## 2015-04-13 DIAGNOSIS — I10 Essential (primary) hypertension: Secondary | ICD-10-CM

## 2015-04-13 DIAGNOSIS — Z13220 Encounter for screening for lipoid disorders: Secondary | ICD-10-CM

## 2015-04-13 DIAGNOSIS — E119 Type 2 diabetes mellitus without complications: Secondary | ICD-10-CM

## 2015-04-13 DIAGNOSIS — D649 Anemia, unspecified: Secondary | ICD-10-CM

## 2015-04-13 DIAGNOSIS — E291 Testicular hypofunction: Secondary | ICD-10-CM

## 2015-04-13 MED ORDER — TESTOSTERONE 20.25 MG/ACT (1.62%) TD GEL: TRANSDERMAL | Status: DC

## 2015-04-17 ENCOUNTER — Ambulatory Visit (INDEPENDENT_AMBULATORY_CARE_PROVIDER_SITE_OTHER): Payer: BLUE CROSS/BLUE SHIELD | Admitting: Family Medicine

## 2015-04-17 ENCOUNTER — Encounter: Payer: Self-pay | Admitting: Family Medicine

## 2015-04-17 VITALS — BP 124/83 | HR 79 | Wt 365.0 lb

## 2015-04-17 DIAGNOSIS — Z23 Encounter for immunization: Secondary | ICD-10-CM | POA: Diagnosis not present

## 2015-04-17 DIAGNOSIS — Z0001 Encounter for general adult medical examination with abnormal findings: Secondary | ICD-10-CM | POA: Diagnosis not present

## 2015-04-17 DIAGNOSIS — E119 Type 2 diabetes mellitus without complications: Secondary | ICD-10-CM

## 2015-04-17 DIAGNOSIS — E65 Localized adiposity: Secondary | ICD-10-CM | POA: Diagnosis not present

## 2015-04-17 DIAGNOSIS — F528 Other sexual dysfunction not due to a substance or known physiological condition: Secondary | ICD-10-CM | POA: Diagnosis not present

## 2015-04-17 DIAGNOSIS — E291 Testicular hypofunction: Secondary | ICD-10-CM | POA: Diagnosis not present

## 2015-04-17 DIAGNOSIS — R635 Abnormal weight gain: Secondary | ICD-10-CM

## 2015-04-17 DIAGNOSIS — B356 Tinea cruris: Secondary | ICD-10-CM

## 2015-04-17 LAB — POCT GLYCOSYLATED HEMOGLOBIN (HGB A1C): Hemoglobin A1C: 6.3

## 2015-04-17 MED ORDER — LIRAGLUTIDE 18 MG/3ML ~~LOC~~ SOPN: PEN_INJECTOR | SUBCUTANEOUS | Status: DC

## 2015-04-17 NOTE — Progress Notes (Signed)
Subjective:    Patient ID: Rodney RodriguezWilliam J Mollenkopf, male    DOB: 11/25/1960, 55 y.o.   MRN: [id number]  HPI  Here for CPE today.  He does have some concerns. He does report that he's been extremely and active over the last 3 months and is worried his A1c may be up today.  Has been having some personality change.  Says severeal friends have noted that he has been more grumpy and irritable.  Played football when he was youger and suffered some head injuries. He is taking Lexapro and Ativan. He feels the Lexapro really helps his anger.  He is occ fogetful.  He wonders if he would benefit from a CT Head to see if he has any trauma or early atrophy from playing football when he was younger.  Abnormal weight gain - he would like to lose a 100 lbs.  He is really interested in treatment options, med or another surgery.   Diabetes - no hypoglycemic events. No wounds or sores that are not healing well. No increased thirst or urination. Checking glucose at home. Taking medications as prescribed without any side effects.  Taking metformin once a day.    Has has a large pouch of fat at the base of penis.  This causes a lot of friction and irritation and trapping of moisture which causes him to have a constant rash. Making it hard to urinate.  Causing skin problems.  Uses gold bond help control his symptoms but it's a constant battle.    Hypogonadism - says like the gel for replacement therapy worked great.  But had to change to the patch last year bc of insurance.  He is happy to try to change back to the gel.    Review of Systems     Objective:   Physical Exam  Constitutional: He is oriented to person, place, and time. He appears well-developed and well-nourished.  HENT:  Head: Normocephalic and atraumatic.  Right Ear: External ear normal.  Left Ear: External ear normal.  Nose: Nose normal.  Mouth/Throat: Oropharynx is clear and moist.  Eyes: Conjunctivae and EOM are normal. Pupils are equal, round, and  reactive to light.  Neck: Normal range of motion. Neck supple. No thyromegaly present.  Cardiovascular: Normal rate, regular rhythm, normal heart sounds and intact distal pulses.   Pulmonary/Chest: Effort normal and breath sounds normal.  Abdominal: Soft. Bowel sounds are normal. He exhibits no distension and no mass. There is no tenderness. There is no rebound and no guarding.  Musculoskeletal: Normal range of motion.  Lymphadenopathy:    He has no cervical adenopathy.  Neurological: He is alert and oriented to person, place, and time. He has normal reflexes.  Skin: Skin is warm and dry.  Psychiatric: He has a normal mood and affect. His behavior is normal. Judgment and thought content normal.          Assessment & Plan:    CPE Keep up a regular exercise program and make sure you are eating a healthy diet Try to eat 4 servings of dairy a day, or if you are lactose intolerant take a calcium with vitamin D daily.  Your vaccines are up to date.  Due for screening labs including CMP and lipid panel and PSA.  DM- well-controlled. Stable. We did discuss possibly adding Victoza to help with weight control and encouraging more regular exercise.  Abnormal weight gain - discussed adding Victoza for better weight control. 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  Tinea  Cruris - he would be a great candidate for panniculectomy. Otherwise is just going to continue to have irritation and rash. Recommend referral to plastic surgeon for further evaluation.  Hypogonadism-hopefully we can get him restarted on the testosterone gel. We went recheck labs in 6-8 weeks to make sure that we are getting him on adequate dosing. He was previously on 4 pumps daily.  Personality change-unclear. It sounds like he has been not able to do his normal routine over the last 3 months. Certainly this can affect mood etc. We could consider CT of the brain to  see if he has any early atrophy etc. from head injuries from football but there really isn't anything that we can do to prevent decline. It really wouldn't change treatment for himself at this time I recommend holding off on any further imaging.

## 2015-04-19 ENCOUNTER — Telehealth: Payer: Self-pay | Admitting: Family Medicine

## 2015-04-19 NOTE — Telephone Encounter (Signed)
Received fax for prior authorization on Androgel sent through cover my meds waiting on authorization. - CF

## 2015-04-21 ENCOUNTER — Other Ambulatory Visit: Payer: Self-pay | Admitting: Family Medicine

## 2015-04-21 ENCOUNTER — Other Ambulatory Visit: Payer: Self-pay

## 2015-04-21 MED ORDER — DILTIAZEM HCL ER COATED BEADS 240 MG PO CP24: 240.0000 mg | ORAL_CAPSULE | Freq: Every day | ORAL | Status: DC

## 2015-04-21 NOTE — Telephone Encounter (Signed)
[facility] Androgel from 04/19/2015 - 04/07/2038. Reference #[id number]. Patient has been notified. - CF

## 2015-04-21 NOTE — Telephone Encounter (Signed)
Rx(s) sent to pharmacy electronically.  

## 2015-04-24 ENCOUNTER — Telehealth: Payer: Self-pay

## 2015-04-24 ENCOUNTER — Encounter (HOSPITAL_COMMUNITY): Payer: Self-pay | Admitting: Psychiatry

## 2015-04-24 ENCOUNTER — Ambulatory Visit (INDEPENDENT_AMBULATORY_CARE_PROVIDER_SITE_OTHER): Payer: BLUE CROSS/BLUE SHIELD | Admitting: Psychiatry

## 2015-04-24 ENCOUNTER — Other Ambulatory Visit: Payer: Self-pay | Admitting: Family Medicine

## 2015-04-24 VITALS — Wt 365.0 lb

## 2015-04-24 DIAGNOSIS — F331 Major depressive disorder, recurrent, moderate: Secondary | ICD-10-CM | POA: Diagnosis not present

## 2015-04-24 DIAGNOSIS — G4700 Insomnia, unspecified: Secondary | ICD-10-CM

## 2015-04-24 DIAGNOSIS — F411 Generalized anxiety disorder: Secondary | ICD-10-CM

## 2015-04-24 MED ORDER — LORAZEPAM 1 MG PO TABS: ORAL_TABLET | ORAL | Status: DC

## 2015-04-24 MED ORDER — ESCITALOPRAM OXALATE 20 MG PO TABS: ORAL_TABLET | ORAL | Status: DC

## 2015-04-24 NOTE — Telephone Encounter (Signed)
Rodney Bright wants STD lab work added to his blood work. Please advise.

## 2015-04-24 NOTE — Progress Notes (Signed)
Patient ID: Rodney Bright, male   DOB: 03/06/1961, 55 y.o.   MRN: [id number]   [facility]  Rodney Bright 10/24/1960  Date: 04/24/2015  HPI Comments: Rodney Bright is a 55 y/o male with a past psychiatric history significant for symptoms of depression and anxiety. The patient was referred for psychiatric services for medication management.    Patient continues to do reasonable as of now regarding depression. He takes Ativan for the anxiety does does not feel comfortable cutting down the dose of Ativan.  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  No side effects reported. He takes Ativan sometimes at nighttime that helps him sleep. History another therapist so that he can work on this esteem and depression and the long-term  . Severity: Depression: 6/10 (0=Very depressed; 5=Neutral; 10=Very Happy)  Anxiety- 4/10 (0=no anxiety; 5= moderate/tolerable anxiety; 10= panic attacks) . Duration: Patient has had symptoms over 20 years. . Timing: Anxiety worst at nght. . Context-Patient reports that his mood is worse during the winter. . Modifying factors- Improved with Therapy and animals.  Worsens with family and work stressor.  . Associated signs and symptoms: In the area of affective symptoms, patient appears somewhat dysphoric. Patient denies current suicidal ideation, intent, or plan. Patient denies current homicidal ideation, intent, or plan. Patient denies auditory hallucinations. Patient denies visual hallucinations. Patient denies symptoms of paranoia. Patient states sleep is good, but reports he sleeps 8-9 hours. Appetite is good. Energy level is fair to good. Patient denies symptoms of anhedonia.      Review of Systems  Constitutional: Negative for fever.  Cardiovascular: Negative for  chest pain and palpitations.  Musculoskeletal: Positive for back pain.  Skin: Negative for rash.  Psychiatric/Behavioral: Negative for depression, suicidal ideas and substance abuse.     Filed Vitals:   04/24/15 1040  Weight: 365 lb (165.563 kg)    Physical Exam  Constitutional: No distress.  Obese  Skin: He is not diaphoretic.  Musculoskeletal: Gait & Station: normal Patient leans: N/A  Traumatic Brain Injury: Played college football.   Past Psychiatric History: Reviewed   Past Medical History: Reviewed  Past Medical History  Diagnosis Date  . Paroxysmal atrial fibrillation (HCC)   . Lymphadenopathy   . Testosterone deficiency   . Anemia   . Vitamin D deficiency   . ED (erectile dysfunction)   . Morbid obesity (HCC)   . OSA (obstructive sleep apnea)     CPAP-17.5 cm water pressure  . Kidney stones     stent, lithotripsy  . Diabetes mellitus     controlled  . Gout   . S/P emergency tracheotomy for assistance in breathing [facility](HCC)     at age 89  . Pneumonia     History of Loss of Consciousness: Yes-with afib  Seizure History: No  Cardiac History: Yes-Afib   Allergies:  Allergies  Allergen Reactions  . Colchicine Shortness Of Breath, Swelling and Itching  . Peach Flavor     "Peaches" " swelling of face"     Current Medications: Current Outpatient Prescriptions on File Prior to Visit  Medication Sig Dispense Refill  . allopurinol (ZYLOPRIM) 100 MG tablet Due for follow up appointment. TAKE 1 TABLET BY MOUTH EVERY DAY 30 tablet 0  . AMBULATORY NON FORMULARY MEDICATION Medication Name: CPAP    .  amiodarone (PACERONE) 200 MG tablet Take 1 tablet (200 mg total) by mouth 2 (two) times daily. NEED OV. 60 tablet 0  . AMRIX 30 MG 24 hr capsule Take 30 mg by mouth as needed for muscle spasms.     Marland Kitchen aspirin EC 81 MG tablet Take 1 tablet (81 mg total) by mouth daily. 90 tablet 3  . diltiazem (CARDIZEM CD) 240 MG 24 hr capsule Take 1 capsule (240 mg total) by mouth  daily. NEEDS APPOINTMENT FOR FUTURE REFILLS 30 capsule 0  . ferrous fumarate-iron polysaccharide complex (TANDEM) 162-115.2 MG CAPS Take 1 capsule by mouth daily with breakfast. (Patient not taking: Reported on 04/17/2015) 30 capsule 11  . GLUCOSAMINE-CHONDROITIN PO Take 1 tablet by mouth daily. Reported on 04/17/2015    . HYDROcodone-acetaminophen (NORCO) 10-325 MG per tablet Take 1 tablet by mouth every 8 (eight) hours as needed. 90 tablet 0  . indomethacin (INDOCIN) 50 MG capsule For gout flares 60 capsule 1  . Liraglutide (VICTOZA) 18 MG/3ML SOPN 0.6mg  [place] qd x 1 week, then increase to 1.2 mg daily 6 mL 2  . lisinopril (PRINIVIL,ZESTRIL) 5 MG tablet Take 1 tablet (5 mg total) by mouth daily. 90 tablet 0  . metFORMIN (GLUCOPHAGE) 500 MG tablet Take 1 tablet (500 mg total) by mouth 2 (two) times daily with a meal. 180 tablet 3  . Multiple Vitamin (THERA) TABS Take 1 tablet by mouth.    . oxyCODONE (ROXICODONE) 15 MG immediate release tablet   0  . pantoprazole (PROTONIX) 40 MG tablet Take 1 tablet (40 mg total) by mouth 2 (two) times daily. 60 tablet 1  . pravastatin (PRAVACHOL) 40 MG tablet Take 1 tablet (40 mg total) by mouth at bedtime. Need follow up with Dr. Linford Arnold for future refills. 30 tablet 0  . Testosterone (ANDROGEL PUMP) 20.25 MG/ACT (1.62%) GEL PLACE 4 PUMPS ON SKIN EVERY MORNING 150 g 5  . tiZANidine (ZANAFLEX) 4 MG tablet Take 4 mg by mouth once.     . Vitamin D, Ergocalciferol, (DRISDOL) 50000 UNITS CAPS capsule Take 50,000 Units by mouth daily. Reported on 04/17/2015    . zolpidem (AMBIEN) 10 MG tablet Take 10 mg by mouth at bedtime.      No current facility-administered medications on file prior to visit.      Previous Psychotropic Medications: Reviewed  Medication  Dose   lexapro-worked better   was on higher dose now at 40mg   Citalopram  upto 200 mg daily   Lorazepam    Zolpidem    Prozac-more anxious    Zoloft-didn't work     Substance Abuse History in the last 12  months: Reviewed  Patient reports he has used all types of drugs in his 20's specifically cocaine.    Social History   Social History  . Marital Status: Married    Spouse Name: N/A  . Number of Children: N/A  . Years of Education: N/A   Social History Main Topics  . Smoking status: Never Smoker   . Smokeless tobacco: None  . Alcohol Use: No  . Drug Use: No     Comment: prior cocaine abuse (heavy) in his 72s.  denies subsequent use  . Sexual Activity:    Partners: Female    Copy: None     Comment: on full disability, separated, no regular exercise,walks some, drinks pot of coffee a day.   Other Topics Concern  . None   Social History Narrative   Drinks one  pot of coffee daily.   Regular exercise-no, walks some      Lives in [place] with roommate.   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    Caffeine: Caffeinated Beverages 1 5 hour energy   Family History: Reviewed  Family History  Problem Relation Age of Onset  . Cancer Mother     lung, heavy smoker  . Hypertension Mother   . Hyperlipidemia Mother   . Cancer Father     melanoma  . Aneurysm Father     cardiac  . Hyperlipidemia Father   . Hypertension Father   . Hyperlipidemia Sister   . Hypertension Sister   . Hyperlipidemia Brother   . Hypertension Brother   . Stroke Other     Psychiatric Specialty Examination: Objective: Appearance: Casual   Eye Contact:: Good   Speech: Clear and Coherent and Normal Rate   Volume: Normal   Mood: euthymic today  Affect: Appropriate, Congruent and Full Range   Thought Process: Coherent, Linear and Logical   Orientation: Full   Thought Content: WDL   Suicidal Thoughts: No   Homicidal Thoughts: No   Judgement: Fair   Insight: Good   Psychomotor Activity: Normal   Akathisia: Yes   Handed: Right   Memory-immediate 3/3; recent-2/3  Fund of knowledge-average to above average  Language-Intact  AIMS (if indicated):  None   Assets: Communication Skills  Desire for Improvement  Financial Resources/Insurance  Physical Health    Laboratory/X-Ray  Psychological Evaluation(s)   none  Not available    Assessment:  AXIS I  Major Depression, Recurrent severe-improving , 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 GAD. insomnia  AXIS II  No diagnosis   AXIS III  Past Medical History    Diagnosis  Date    .  Atrial fibrillation     .  Lymphadenopathy     .  Testosterone deficiency     .  Anemia     .  Vitamin d deficiency     .  ED (erectile dysfunction)     .  Morbid obesity     .  OSA (obstructive sleep apnea)       CPAP-17.5 cm water pressure    .  Kidney stones       stent, lithotripsy    .  Lymphadenopathy     .  Diabetes mellitus       controlled    .  Testosterone deficiency     .  Excess or deficiency of vitamin D     .  ED (erectile dysfunction)       AXIS IV  economic problems, educational problems, occupational problems and problems with primary support group   AXIS V  60-moderate symptoms    Treatment Plan/Recommendations:  PLAN:   Continue Lexapro now at a dose of 20 mg twice a day for depression and anxiety Continue Ativan for anxiety. Advised to take lower dose at times. reveiwed sleep hygiene. Takes it for insomnia as well. He takes [place] from other provider for insomnia at times or alternate with [place].  Says he is trying to look for therapist in [place] .  Keeps himself busy and is not feeling hopeless  He keeps himself active.   Cautioned and recommended to closely follow with his providers. Follow up in 3 months. call 911 for  Any urgency or suicidal toughts.    Thresa Ross, M.D.  04/24/2015 10:48 AM

## 2015-04-24 NOTE — Telephone Encounter (Signed)
Ok to add HIV, HEP C, RPR,  Urine GC and chlam.

## 2015-04-25 ENCOUNTER — Other Ambulatory Visit: Payer: Self-pay | Admitting: *Deleted

## 2015-04-25 LAB — COMPLETE METABOLIC PANEL WITH GFR
ALT: 24 U/L (ref 9–46)
AST: 22 U/L (ref 10–35)
Albumin: 4.3 g/dL (ref 3.6–5.1)
Alkaline Phosphatase: 82 U/L (ref 40–115)
BUN: 22 mg/dL (ref 7–25)
CO2: 27 mmol/L (ref 20–31)
Calcium: 9.6 mg/dL (ref 8.6–10.3)
Chloride: 105 mmol/L (ref 98–110)
Creat: 1.25 mg/dL (ref 0.70–1.33)
GFR, Est African American: 74 mL/min (ref >=60)
GFR, Est Non African American: 64 mL/min (ref >=60)
Glucose, Bld: 102 mg/dL — ABNORMAL HIGH (ref 65–99)
Potassium: 4.5 mmol/L (ref 3.5–5.3)
Sodium: 141 mmol/L (ref 135–146)
Total Bilirubin: 0.6 mg/dL (ref 0.2–1.2)
Total Protein: 7 g/dL (ref 6.1–8.1)

## 2015-04-25 LAB — CBC WITH DIFFERENTIAL/PLATELET
Basophils Absolute: 0 10*3/uL (ref 0.0–0.1)
Basophils Relative: 0 % (ref 0–1)
Eosinophils Absolute: 0.1 10*3/uL (ref 0.0–0.7)
Eosinophils Relative: 1 % (ref 0–5)
HCT: 45.1 % (ref 39.0–52.0)
Hemoglobin: 15.2 g/dL (ref 13.0–17.0)
Lymphocytes Relative: 26 % (ref 12–46)
Lymphs Abs: 2.3 10*3/uL (ref 0.7–4.0)
MCH: 29.7 pg (ref 26.0–34.0)
MCHC: 33.7 g/dL (ref 30.0–36.0)
MCV: 88.3 fL (ref 78.0–100.0)
MPV: 11.1 fL (ref 8.6–12.4)
Monocytes Absolute: 0.6 10*3/uL (ref 0.1–1.0)
Monocytes Relative: 7 % (ref 3–12)
Neutro Abs: 5.9 10*3/uL (ref 1.7–7.7)
Neutrophils Relative %: 66 % (ref 43–77)
Platelets: 220 10*3/uL (ref 150–400)
RBC: 5.11 MIL/uL (ref 4.22–5.81)
RDW: 15 % (ref 11.5–15.5)
WBC: 8.9 10*3/uL (ref 4.0–10.5)

## 2015-04-25 LAB — PSA: PSA: 0.79 ng/mL (ref <=4.00)

## 2015-04-25 LAB — LIPID PANEL
Cholesterol: 157 mg/dL (ref 125–200)
HDL: 43 mg/dL (ref >=40)
LDL Cholesterol: 85 mg/dL (ref <130)
Total CHOL/HDL Ratio: 3.7 Ratio (ref <=5.0)
Triglycerides: 144 mg/dL (ref <150)
VLDL: 29 mg/dL (ref <30)

## 2015-04-25 LAB — TSH: TSH: 3.31 u[IU]/mL (ref 0.350–4.500)

## 2015-04-25 LAB — HEMOGLOBIN A1C
Hgb A1c MFr Bld: 6.5 % — ABNORMAL HIGH (ref <5.7)
Mean Plasma Glucose: 140 mg/dL — ABNORMAL HIGH (ref <117)

## 2015-04-25 LAB — TESTOSTERONE: Testosterone: 691 ng/dL (ref 250–827)

## 2015-04-25 NOTE — Telephone Encounter (Signed)
Added all except urine.

## 2015-04-26 LAB — RPR

## 2015-04-26 LAB — HIV ANTIBODY (ROUTINE TESTING W REFLEX): HIV 1&2 Ab, 4th Generation: NONREACTIVE

## 2015-04-26 LAB — HEPATITIS C ANTIBODY: HCV Ab: NEGATIVE

## 2015-05-19 ENCOUNTER — Telehealth (HOSPITAL_COMMUNITY): Payer: Self-pay | Admitting: *Deleted

## 2015-05-19 NOTE — Telephone Encounter (Addendum)
No prior authorization is required for pt for Escitalopram Oxalate  per [employer] [place]. Spoke w/ Debbie at [facility], pt will be notified once prescription is ready for pickup. Pt is schedule for a f/u appt on 4/17.

## 2015-05-19 NOTE — Telephone Encounter (Signed)
Received prior authorization from [facility] for escitalopram (LEXAPRO) 20 MG tablet. PA was submitted through cover my meds, PA # [id number], decision determination with 72 hours.

## 2015-05-24 ENCOUNTER — Other Ambulatory Visit: Payer: Self-pay | Admitting: Family Medicine

## 2015-05-29 ENCOUNTER — Ambulatory Visit (INDEPENDENT_AMBULATORY_CARE_PROVIDER_SITE_OTHER): Payer: BLUE CROSS/BLUE SHIELD | Admitting: Family Medicine

## 2015-05-29 ENCOUNTER — Encounter: Payer: Self-pay | Admitting: Family Medicine

## 2015-05-29 VITALS — BP 126/77 | HR 74 | Wt 359.0 lb

## 2015-05-29 DIAGNOSIS — E119 Type 2 diabetes mellitus without complications: Secondary | ICD-10-CM

## 2015-05-29 DIAGNOSIS — R39198 Other difficulties with micturition: Secondary | ICD-10-CM | POA: Diagnosis not present

## 2015-05-29 DIAGNOSIS — E291 Testicular hypofunction: Secondary | ICD-10-CM

## 2015-05-29 DIAGNOSIS — F3341 Major depressive disorder, recurrent, in partial remission: Secondary | ICD-10-CM

## 2015-05-29 NOTE — Progress Notes (Signed)
   Subjective:    Patient ID: Rodney Bright, male    DOB: March 26, 1961, 55 y.o.   MRN: [id number]  HPI Diabetes - no hypoglycemic events. No wounds or sores that are not healing well. No increased thirst or urination. Checking glucose at home. Taking medications as prescribed without any side effects.  Last A1C was 6.5.    Hypogonadism - he has been on 4 pumps of testosterone.  Doing well and he is feeling better.    Needs referral to Urology.  The plastic surgeon he saw for panniculectomy recommend he be eval by Urology as well before surgery.  He also need notes documenting his frequent rashes and treatment. It would help potentially get the surgery covered through his insurance plan.  Depression-he would like to get in with a new therapist. He prefers a male. He's been seeing someone downstairs and just doesn't feel like they are really responding to his needs.  Review of Systems     Objective:   Physical Exam  Constitutional: He is oriented to person, place, and time. He appears well-developed and well-nourished.  HENT:  Head: Normocephalic and atraumatic.  Eyes: Conjunctivae and EOM are normal.  Cardiovascular: Normal rate.   Pulmonary/Chest: Effort normal.  Neurological: He is alert and oriented to person, place, and time.  Skin: Skin is dry. No pallor.  Psychiatric: He has a normal mood and affect. His behavior is normal.  Vitals reviewed.         Assessment & Plan:  DM- well controlled.  A1C up a little to 6.5. He is on Victoza now for about 6 weeks and has been doing really well. He's lost about 6 pounds which is fantastic.  Hypogonadism - doing well on testosterone level.  testosterone levels look great!  F/U in 6 months.    Morbid obesity - he is doing well on Victoza.  He is up to 1.2 mg.  He has lost 6 lbs. He is doing well. Did have some itching after the first dose but none since then.    Chronic back pain  - Pain management referral needed as awll. He will call  back with the name.   Depression-we'll place referral to Dr. Dellia Cloud at behavioral health at [facility].  Difficulty with urination-referral placed to urology in [facility] and also per recommendation by plastic surgeon.

## 2015-06-01 ENCOUNTER — Ambulatory Visit (INDEPENDENT_AMBULATORY_CARE_PROVIDER_SITE_OTHER): Payer: BLUE CROSS/BLUE SHIELD | Admitting: Licensed Clinical Social Worker

## 2015-06-01 DIAGNOSIS — F331 Major depressive disorder, recurrent, moderate: Secondary | ICD-10-CM

## 2015-06-05 ENCOUNTER — Ambulatory Visit (INDEPENDENT_AMBULATORY_CARE_PROVIDER_SITE_OTHER): Payer: BLUE CROSS/BLUE SHIELD | Admitting: Family Medicine

## 2015-06-05 ENCOUNTER — Encounter: Payer: Self-pay | Admitting: Family Medicine

## 2015-06-05 VITALS — BP 138/80 | HR 70 | Wt 361.0 lb

## 2015-06-05 DIAGNOSIS — G4733 Obstructive sleep apnea (adult) (pediatric): Secondary | ICD-10-CM | POA: Diagnosis not present

## 2015-06-05 DIAGNOSIS — Z9989 Dependence on other enabling machines and devices: Principal | ICD-10-CM

## 2015-06-05 MED ORDER — AMBULATORY NON FORMULARY MEDICATION: Status: AC

## 2015-06-05 NOTE — Progress Notes (Signed)
Subjective:    Patient ID: Rodney Bright, male    DOB: Jan 15, 1961, 54 y.o.   MRN: [id number]  HPI He was orginal dx at age 32. Last sleep study was 02/24/05.  His pressure is set on 17.5 cm water pressure. He has been using his CPAP nightly.  He is using his original machine from 2006.  He prefers not to have humidifier.  He prefers a medium nasal mask, Respironics.  He feels much better during the daytime when he uses it.  Let sick and ill when uses it.  He also has afib and HTN.  He is here today because he needs a new mask. The one he is currently using is broken.   Review of Systems   BP 138/80 mmHg  Pulse 70  Wt 361 lb (163.749 kg)  SpO2 94%    Allergies  Allergen Reactions  . Colchicine Shortness Of Breath, Swelling and Itching  . Peach Flavor     "Peaches" " swelling of face"    Past Medical History  Diagnosis Date  . Paroxysmal atrial fibrillation (HCC)   . Lymphadenopathy   . Testosterone deficiency   . Anemia   . Vitamin D deficiency   . ED (erectile dysfunction)   . Morbid obesity (HCC)   . OSA (obstructive sleep apnea)     CPAP-17.5 cm water pressure  . Kidney stones     stent, lithotripsy  . Diabetes mellitus     controlled  . Gout   . S/P emergency tracheotomy for assistance in breathing [facility])     at age 61  . Pneumonia     Past Surgical History  Procedure Laterality Date  . Gastric bypass  2005    600 lbs prior to surgery  . Cholecystectomy    . Tonsillectomy    . Finger surgery      ulnar digital nerve and artery right index finger  . Cystoscopy      retrograde and double J catheter insertion.    Social History   Social History  . Marital Status: Married    Spouse Name: N/A  . Number of Children: N/A  . Years of Education: N/A   Occupational History  . Not on file.   Social History Main Topics  . Smoking status: Never Smoker   . Smokeless tobacco: Not on file  . Alcohol Use: No  . Drug Use: No     Comment: prior cocaine abuse  (heavy) in his 40s.  denies subsequent use  . Sexual Activity:    Partners: Female    Copy: None     Comment: on full disability, separated, no regular exercise,walks some, drinks pot of coffee a day.   Other Topics Concern  . Not on file   Social History Narrative   Drinks one pot of coffee daily.   Regular exercise-no, walks some      Lives in [place] with roommate.   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    Family History  Problem Relation Age of Onset  . Cancer Mother     lung, heavy smoker  . Hypertension Mother   . Hyperlipidemia Mother   . Cancer Father     melanoma  . Aneurysm Father     cardiac  . Hyperlipidemia Father   . Hypertension Father   . Hyperlipidemia Sister   . Hypertension Sister   . Hyperlipidemia Brother   . Hypertension Brother   .  Stroke Other     Outpatient Encounter Prescriptions as of 06/05/2015  Medication Sig  . allopurinol (ZYLOPRIM) 100 MG tablet Take 1 tablet (100 mg total) by mouth daily.  . AMBULATORY NON FORMULARY MEDICATION Medication Name: CPAP  . amiodarone (PACERONE) 200 MG tablet Take 1 tablet (200 mg total) by mouth 2 (two) times daily. NEED OV.  Marland Kitchen AMRIX 30 MG 24 hr capsule Take 30 mg by mouth as needed for muscle spasms.   Marland Kitchen aspirin EC 81 MG tablet Take 1 tablet (81 mg total) by mouth daily.  Marland Kitchen diltiazem (CARDIZEM CD) 240 MG 24 hr capsule Take 1 capsule (240 mg total) by mouth daily. NEEDS APPOINTMENT FOR FUTURE REFILLS  . escitalopram (LEXAPRO) 20 MG tablet Take  tablets twice a day.  . ferrous fumarate-iron polysaccharide complex (TANDEM) 162-115.2 MG CAPS Take 1 capsule by mouth daily with breakfast.  . GLUCOSAMINE-CHONDROITIN PO Take 1 tablet by mouth daily. Reported on 04/17/2015  . HYDROcodone-acetaminophen (NORCO) 10-325 MG per tablet Take 1 tablet by mouth every 8 (eight) hours as needed.  . indomethacin (INDOCIN) 50 MG capsule For gout flares  . Liraglutide  (VICTOZA) 18 MG/3ML SOPN 0.6mg  [place] qd x 1 week, then increase to 1.2 mg daily  . lisinopril (PRINIVIL,ZESTRIL) 5 MG tablet Take 1 tablet (5 mg total) by mouth daily.  Marland Kitchen LORazepam (ATIVAN) 1 MG tablet TAKE 1 TABLET BY MOUTH EVERY 8 HOURS  . metFORMIN (GLUCOPHAGE) 500 MG tablet Take 1 tablet (500 mg total) by mouth 2 (two) times daily with a meal.  . Multiple Vitamin (THERA) TABS Take 1 tablet by mouth.  . oxyCODONE (ROXICODONE) 15 MG immediate release tablet   . pantoprazole (PROTONIX) 40 MG tablet Take 1 tablet (40 mg total) by mouth 2 (two) times daily.  . pravastatin (PRAVACHOL) 40 MG tablet Take 1 tablet (40 mg total) by mouth daily.  . Testosterone (ANDROGEL PUMP) 20.25 MG/ACT (1.62%) GEL PLACE 4 PUMPS ON SKIN EVERY MORNING  . tiZANidine (ZANAFLEX) 4 MG tablet Take 4 mg by mouth once.   . Vitamin D, Ergocalciferol, (DRISDOL) 50000 UNITS CAPS capsule Take 50,000 Units by mouth daily. Reported on 04/17/2015  . zolpidem (AMBIEN) 10 MG tablet Take 10 mg by mouth at bedtime.    No facility-administered encounter medications on file as of 06/05/2015.           Objective:   Physical Exam  Constitutional: He is oriented to person, place, and time. He appears well-developed and well-nourished.  HENT:  Head: Normocephalic and atraumatic.  Cardiovascular: Normal rate, regular rhythm and normal heart sounds.   Pulmonary/Chest: Effort normal and breath sounds normal.  Neurological: He is alert and oriented to person, place, and time.  Skin: Skin is warm and dry.  Psychiatric: He has a normal mood and affect. His behavior is normal.          Assessment & Plan:  Obstructive sleep apnea-well right a new prescription for CPAP machine as well as nasal mask and supplies. He does not want to humidifier. His pressure is set at 17.5 cm of water pressure. Will send information over to [facility].

## 2015-06-08 ENCOUNTER — Ambulatory Visit (INDEPENDENT_AMBULATORY_CARE_PROVIDER_SITE_OTHER): Payer: BLUE CROSS/BLUE SHIELD | Admitting: Licensed Clinical Social Worker

## 2015-06-08 DIAGNOSIS — F331 Major depressive disorder, recurrent, moderate: Secondary | ICD-10-CM

## 2015-06-12 ENCOUNTER — Other Ambulatory Visit: Payer: Self-pay | Admitting: Family Medicine

## 2015-06-13 ENCOUNTER — Other Ambulatory Visit: Payer: Self-pay | Admitting: Family Medicine

## 2015-06-21 DIAGNOSIS — G4733 Obstructive sleep apnea (adult) (pediatric): Secondary | ICD-10-CM | POA: Diagnosis not present

## 2015-06-22 ENCOUNTER — Ambulatory Visit (INDEPENDENT_AMBULATORY_CARE_PROVIDER_SITE_OTHER): Payer: BLUE CROSS/BLUE SHIELD | Admitting: Licensed Clinical Social Worker

## 2015-06-22 DIAGNOSIS — F331 Major depressive disorder, recurrent, moderate: Secondary | ICD-10-CM

## 2015-06-29 ENCOUNTER — Ambulatory Visit (INDEPENDENT_AMBULATORY_CARE_PROVIDER_SITE_OTHER): Payer: BLUE CROSS/BLUE SHIELD | Admitting: Licensed Clinical Social Worker

## 2015-06-29 DIAGNOSIS — F331 Major depressive disorder, recurrent, moderate: Secondary | ICD-10-CM | POA: Diagnosis not present

## 2015-07-05 ENCOUNTER — Other Ambulatory Visit: Payer: Self-pay | Admitting: *Deleted

## 2015-07-05 MED ORDER — DILTIAZEM HCL ER COATED BEADS 240 MG PO CP24: 240.0000 mg | ORAL_CAPSULE | Freq: Every day | ORAL | Status: DC

## 2015-07-06 ENCOUNTER — Ambulatory Visit: Payer: BLUE CROSS/BLUE SHIELD | Admitting: Licensed Clinical Social Worker

## 2015-07-10 ENCOUNTER — Ambulatory Visit (INDEPENDENT_AMBULATORY_CARE_PROVIDER_SITE_OTHER): Payer: BLUE CROSS/BLUE SHIELD | Admitting: Family Medicine

## 2015-07-10 DIAGNOSIS — E119 Type 2 diabetes mellitus without complications: Secondary | ICD-10-CM

## 2015-07-10 NOTE — Progress Notes (Signed)
   Subjective:    Patient ID: Rodney Bright, male    DOB: 01/21/1961, 55 y.o.   MRN: [id number]  HPI    Review of Systems     Objective:   Physical Exam        Assessment & Plan:  Patient has been deemed a "no-show" for today's scheduled appointment.  Based on chief complaint and my chart review:  -Follow-up advised.  Contacting patient and urged to schedule visit in 1-2 weeks.  If necessary this was communicated to the patient via phone or mail on: 12:00 PM 07/10/2015

## 2015-07-11 ENCOUNTER — Telehealth: Payer: Self-pay | Admitting: Family Medicine

## 2015-07-11 NOTE — Telephone Encounter (Signed)
FYI Pt was contacted regarding his No Show appt  on 4/3. He said he did not get a reminder and this was reason he did not come to his appt. Appt has been rescheduled for 4/20.

## 2015-07-13 ENCOUNTER — Ambulatory Visit: Payer: BLUE CROSS/BLUE SHIELD | Admitting: Licensed Clinical Social Worker

## 2015-07-19 ENCOUNTER — Telehealth (HOSPITAL_COMMUNITY): Payer: Self-pay | Admitting: *Deleted

## 2015-07-19 DIAGNOSIS — F331 Major depressive disorder, recurrent, moderate: Secondary | ICD-10-CM

## 2015-07-19 DIAGNOSIS — F411 Generalized anxiety disorder: Secondary | ICD-10-CM

## 2015-07-19 MED ORDER — LORAZEPAM 1 MG PO TABS: ORAL_TABLET | ORAL | Status: DC

## 2015-07-19 NOTE — Telephone Encounter (Signed)
Pt called for a refill for Lorazepam 1mg . Per Dr. Gilmore LarocheAkhtar, please phone in to [facility] for Lorazepam 1mg , #90. Verbal order given to Carlann at [facility]. Pt is schedule for a f/u appt on 4/17. Called and informed pt of rx status. PT verbalizes understanding,

## 2015-07-20 ENCOUNTER — Ambulatory Visit (INDEPENDENT_AMBULATORY_CARE_PROVIDER_SITE_OTHER): Payer: BLUE CROSS/BLUE SHIELD | Admitting: Licensed Clinical Social Worker

## 2015-07-20 DIAGNOSIS — F331 Major depressive disorder, recurrent, moderate: Secondary | ICD-10-CM | POA: Diagnosis not present

## 2015-07-24 ENCOUNTER — Ambulatory Visit (HOSPITAL_COMMUNITY): Payer: Self-pay | Admitting: Psychiatry

## 2015-07-27 ENCOUNTER — Ambulatory Visit (INDEPENDENT_AMBULATORY_CARE_PROVIDER_SITE_OTHER): Payer: BLUE CROSS/BLUE SHIELD | Admitting: Family Medicine

## 2015-07-27 ENCOUNTER — Ambulatory Visit: Payer: Self-pay | Admitting: Family Medicine

## 2015-07-27 ENCOUNTER — Encounter: Payer: Self-pay | Admitting: Family Medicine

## 2015-07-27 VITALS — BP 134/74 | HR 70 | Wt 351.0 lb

## 2015-07-27 DIAGNOSIS — E119 Type 2 diabetes mellitus without complications: Secondary | ICD-10-CM | POA: Diagnosis not present

## 2015-07-27 DIAGNOSIS — F3341 Major depressive disorder, recurrent, in partial remission: Secondary | ICD-10-CM | POA: Diagnosis not present

## 2015-07-27 DIAGNOSIS — I480 Paroxysmal atrial fibrillation: Secondary | ICD-10-CM | POA: Diagnosis not present

## 2015-07-27 DIAGNOSIS — Z9989 Dependence on other enabling machines and devices: Secondary | ICD-10-CM

## 2015-07-27 DIAGNOSIS — G4733 Obstructive sleep apnea (adult) (pediatric): Secondary | ICD-10-CM | POA: Diagnosis not present

## 2015-07-27 LAB — POCT GLYCOSYLATED HEMOGLOBIN (HGB A1C): Hemoglobin A1C: 5.7

## 2015-07-27 NOTE — Progress Notes (Signed)
Subjective:    Patient ID: Rodney RodriguezWilliam J Bright, male    DOB: 12/28/1960, 55 y.o.   MRN: [id number]  HPI Diabetes - no hypoglycemic events. No wounds or sores that are not healing well. No increased thirst or urination. Checking glucose at home. Taking medications as prescribed without any side effects.The Victoza without any nausea or abdominal pain or side effects. In fact he has lost more weight. He is down 10 pounds.He did start a new job.  Started sleep apnea-I last saw him in February recheck to get him a new CPAP machine because hismachine was not working well. He is doing well and says he actually feels much better since getting a new machine. He felt like his old one that was 57 years old was just not working properly and he was feeling more fatigued.  Follow-up depression- he was scheduled with Dede QueryJulie Whit in Feb.   he hasn't seeing her regularly and he does feel like it's been helpful.  Atrial fibrillation-he would like to get back in with Dr. Jens Somrenshaw. He says he is overdue for an appointment. He actually feels like he is doing really well and is asymptomatic. He's really not had any problems with chest pain or shortness of breath.  He said he never heard back about a urology referral that we placed in February.  Review of Systems  BP 134/74 mmHg  Pulse 70  Wt 351 lb (159.213 kg)  SpO2 97%    Allergies  Allergen Reactions  . Colchicine Shortness Of Breath, Swelling and Itching  . Peach Flavor     "Peaches" " swelling of face"    Past Medical History  Diagnosis Date  . Paroxysmal atrial fibrillation (HCC)   . Lymphadenopathy   . Testosterone deficiency   . Anemia   . Vitamin D deficiency   . ED (erectile dysfunction)   . Morbid obesity (HCC)   . OSA (obstructive sleep apnea)     CPAP-17.5 cm water pressure  . Kidney stones     stent, lithotripsy  . Diabetes mellitus     controlled  . Gout   . S/P emergency tracheotomy for assistance in breathing [facility](HCC)     at age 89   . Pneumonia     Past Surgical History  Procedure Laterality Date  . Gastric bypass  2005    600 lbs prior to surgery  . Cholecystectomy    . Tonsillectomy    . Finger surgery      ulnar digital nerve and artery right index finger  . Cystoscopy      retrograde and double J catheter insertion.    Social History   Social History  . Marital Status: Married    Spouse Name: N/A  . Number of Children: N/A  . Years of Education: N/A   Occupational History  . Not on file.   Social History Main Topics  . Smoking status: Never Smoker   . Smokeless tobacco: Not on file  . Alcohol Use: No  . Drug Use: No     Comment: prior cocaine abuse (heavy) in his 9420s.  denies subsequent use  . Sexual Activity:    Partners: Female    CopyBirth Control/ Protection: None     Comment: on full disability, separated, no regular exercise,walks some, drinks pot of coffee a day.   Other Topics Concern  . Not on file   Social History Narrative   Drinks one pot of coffee daily.   Regular exercise-no, walks  some      Lives in [place] with roommate.   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    Family History  Problem Relation Age of Onset  . Cancer Mother     lung, heavy smoker  . Hypertension Mother   . Hyperlipidemia Mother   . Cancer Father     melanoma  . Aneurysm Father     cardiac  . Hyperlipidemia Father   . Hypertension Father   . Hyperlipidemia Sister   . Hypertension Sister   . Hyperlipidemia Brother   . Hypertension Brother   . Stroke Other     Outpatient Encounter Prescriptions as of 07/27/2015  Medication Sig  . allopurinol (ZYLOPRIM) 100 MG tablet Take 1 tablet (100 mg total) by mouth daily.  . AMBULATORY NON FORMULARY MEDICATION Medication Name: CPAP machine with nasal mask and supplies set to 17.5 cm water pressure.  No humidifier.  Sent to [facility].  See sleep study from 2009 attached.  Dx: OSA.  Marland Kitchen amiodarone (PACERONE) 200 MG tablet Take 1  tablet (200 mg total) by mouth 2 (two) times daily. NEED OV.  Marland Kitchen AMRIX 30 MG 24 hr capsule Take 30 mg by mouth as needed for muscle spasms.   Marland Kitchen aspirin EC 81 MG tablet Take 1 tablet (81 mg total) by mouth daily.  Marland Kitchen diltiazem (CARDIZEM CD) 240 MG 24 hr capsule Take 1 capsule (240 mg total) by mouth daily. NEED OV.  . escitalopram (LEXAPRO) 20 MG tablet Take  tablets twice a day.  Marland Kitchen HYDROcodone-acetaminophen (NORCO) 10-325 MG per tablet Take 1 tablet by mouth every 8 (eight) hours as needed.  Marland Kitchen lisinopril (PRINIVIL,ZESTRIL) 5 MG tablet Take 1 tablet (5 mg total) by mouth daily.  Marland Kitchen LORazepam (ATIVAN) 1 MG tablet TAKE 1 TABLET BY MOUTH EVERY 8 HOURS  . metFORMIN (GLUCOPHAGE) 500 MG tablet Take 1 tablet (500 mg total) by mouth 2 (two) times daily with a meal.  . oxyCODONE (ROXICODONE) 15 MG immediate release tablet   . pravastatin (PRAVACHOL) 40 MG tablet Take 1 tablet (40 mg total) by mouth daily.  . Testosterone (ANDROGEL PUMP) 20.25 MG/ACT (1.62%) GEL PLACE 4 PUMPS ON SKIN EVERY MORNING  . tiZANidine (ZANAFLEX) 4 MG tablet Take 4 mg by mouth once.   Marland Kitchen VICTOZA 18 MG/3ML SOPN 0.6mg  subcutaneously every day x 1 week, then increase to 1.2 mg daily (36/1.2=30)  . zolpidem (AMBIEN) 10 MG tablet Take 10 mg by mouth at bedtime.   . [DISCONTINUED] ferrous fumarate-iron polysaccharide complex (TANDEM) 162-115.2 MG CAPS Take 1 capsule by mouth daily with breakfast.  . [DISCONTINUED] GLUCOSAMINE-CHONDROITIN PO Take 1 tablet by mouth daily. Reported on 04/17/2015  . [DISCONTINUED] indomethacin (INDOCIN) 50 MG capsule For gout flares  . [DISCONTINUED] Multiple Vitamin (THERA) TABS Take 1 tablet by mouth.  . [DISCONTINUED] pantoprazole (PROTONIX) 40 MG tablet Take 1 tablet (40 mg total) by mouth 2 (two) times daily.  . [DISCONTINUED] Vitamin D, Ergocalciferol, (DRISDOL) 50000 UNITS CAPS capsule Take 50,000 Units by mouth daily. Reported on 04/17/2015   No facility-administered encounter medications on file as of  07/27/2015.          Objective:   Physical Exam  Constitutional: He is oriented to person, place, and time. He appears well-developed and well-nourished.  HENT:  Head: Normocephalic and atraumatic.  Cardiovascular: Normal rate, regular rhythm and normal heart sounds.   Pulmonary/Chest: Effort normal and breath sounds normal.  Neurological: He is alert and oriented  to person, place, and time.  Skin: Skin is warm and dry.  Psychiatric: He has a normal mood and affect. His behavior is normal.          Assessment & Plan:  DM- Well controlled, including A1c of 5.7 today. Continue current regimen. Follow-up in 3-4 months.Keep up the great work. He's lost 10 more pounds. Continue to watch diet get regular exercise. He has been trying to go to the gym some. Continue current dose of Victoza.  Depression - Actively participating in counseling.  Obstructive sleep apnea-doing very well. He says he uses it every single night and has since he was about 55 years old. And he's been using it for more than 4 hours per night. He has not had any problems with it or adjustment of the mass.  Atrial fibrillation-we'll place referral back to Dr. Jens Som. He has rate controlled with diltiazem and amiodarone.  Will check on the urology referral. It was placed back in February but he says he never heard from their office. We'll have our referral coordinator contact them today.

## 2015-08-03 ENCOUNTER — Other Ambulatory Visit: Payer: Self-pay | Admitting: *Deleted

## 2015-08-03 MED ORDER — DILTIAZEM HCL ER COATED BEADS 240 MG PO CP24: 240.0000 mg | ORAL_CAPSULE | Freq: Every day | ORAL | Status: DC

## 2015-08-03 MED ORDER — AMIODARONE HCL 200 MG PO TABS: 200.0000 mg | ORAL_TABLET | Freq: Two times a day (BID) | ORAL | Status: DC

## 2015-08-16 ENCOUNTER — Telehealth (HOSPITAL_COMMUNITY): Payer: Self-pay | Admitting: *Deleted

## 2015-08-16 DIAGNOSIS — F411 Generalized anxiety disorder: Secondary | ICD-10-CM

## 2015-08-16 DIAGNOSIS — F331 Major depressive disorder, recurrent, moderate: Secondary | ICD-10-CM

## 2015-08-16 MED ORDER — ESCITALOPRAM OXALATE 20 MG PO TABS: ORAL_TABLET | ORAL | Status: DC

## 2015-08-16 NOTE — Telephone Encounter (Signed)
Pt called for a refill for Lexapro 20mg . Per Dr. Gilmore LarocheAkhtar, pt is authorized for a refill Lexapro 20mg , #60. Rx was sent to pharmacy. Pt is schedule for a f/u appt on 09/07/15. Called and informed pt of rx status . Pt verbalizes understanding.

## 2015-08-16 NOTE — Telephone Encounter (Signed)
Pt will need a written prescription for Lorazepam (Ativan) 1mg . Please call once rx is ready for pickup. Pt has a f/u appt on 09/07/15.

## 2015-08-17 MED ORDER — LORAZEPAM 1 MG PO TABS: ORAL_TABLET | ORAL | Status: DC

## 2015-08-17 NOTE — Telephone Encounter (Signed)
Ativan printed for pick up 

## 2015-08-23 ENCOUNTER — Ambulatory Visit: Payer: Self-pay | Admitting: Cardiology

## 2015-08-24 ENCOUNTER — Ambulatory Visit: Payer: BLUE CROSS/BLUE SHIELD | Admitting: Licensed Clinical Social Worker

## 2015-09-07 ENCOUNTER — Encounter (HOSPITAL_COMMUNITY): Payer: Self-pay | Admitting: Psychiatry

## 2015-09-07 ENCOUNTER — Ambulatory Visit (INDEPENDENT_AMBULATORY_CARE_PROVIDER_SITE_OTHER): Payer: Self-pay | Admitting: Psychiatry

## 2015-09-07 VITALS — BP 124/70 | HR 60 | Ht 69.0 in | Wt 351.0 lb

## 2015-09-07 DIAGNOSIS — F1921 Other psychoactive substance dependence, in remission: Secondary | ICD-10-CM

## 2015-09-07 DIAGNOSIS — F331 Major depressive disorder, recurrent, moderate: Secondary | ICD-10-CM

## 2015-09-07 DIAGNOSIS — F321 Major depressive disorder, single episode, moderate: Secondary | ICD-10-CM

## 2015-09-07 DIAGNOSIS — F411 Generalized anxiety disorder: Secondary | ICD-10-CM

## 2015-09-07 DIAGNOSIS — G4700 Insomnia, unspecified: Secondary | ICD-10-CM

## 2015-09-07 MED ORDER — ESCITALOPRAM OXALATE 20 MG PO TABS: ORAL_TABLET | ORAL | Status: DC

## 2015-09-07 MED ORDER — LORAZEPAM 1 MG PO TABS: ORAL_TABLET | ORAL | Status: DC

## 2015-09-07 NOTE — Progress Notes (Signed)
Patient ID: DEANTAE SHACKLETON, male   DOB: [birth year], 55 y.o.   MRN: [id number]   [facility] Follow-up Outpatient Visit  ROSALIO CATTERTON 09-04-1960  Date: 09/07/2015  HPI Comments: Mr. Leffel is a 55 y/o male with a past psychiatric history significant for symptoms of depression and anxiety. The patient was referred for psychiatric services for medication management.    Patient continues to do reasonable as of now regarding depression. He takes Ativan for the anxiety does does not feel comfortable cutting down the dose of Ativan.  Says he understands the risk . At times takes a lower dose during the day.  Says doctors are so we have prescribed or 3 times a day I mentioned that it was already prescribed before I started seeing him so we talked in detail about keeping the dose low if possible during the day.  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  No side effects reported. He takes Ativan sometimes at nighttime that helps him sleep. Recommend therapist so that he can work on this esteem and depression and the long-term  . Severity: Depression: 6/10 (0=Very depressed; 5=Neutral; 10=Very Happy)  Anxiety- 4/10 (0=no anxiety; 5= moderate/tolerable anxiety; 10= panic attacks) . Duration: Patient has had symptoms over 20 years. . Timing: Anxiety worst at nght. . Context-Patient reports that his mood is worse during the winter. . Modifying factors- Improved with Therapy and animals.  Worsens with family and work stressor.  . Associated signs and symptoms: No psychosis or mania.     Review of Systems  Constitutional: Negative for fever.  Cardiovascular: Negative for chest pain and palpitations.  Skin: Negative for rash.  Neurological: Negative for tremors.  Psychiatric/Behavioral: Negative for depression, suicidal  ideas and substance abuse.     Filed Vitals:   09/07/15 0815  BP: 124/70  Pulse: 60  Height: 5\' 9"  (1.753 m)  Weight: 351 lb (159.213 kg)  SpO2: 95%    Physical Exam  Constitutional: No distress.  Obese  Skin: He is not diaphoretic.  Musculoskeletal: Gait & Station: normal Patient leans: N/A  Traumatic Brain Injury: Played college football.   Past Psychiatric History: Reviewed   Past Medical History: Reviewed  Past Medical History  Diagnosis Date  . Paroxysmal atrial fibrillation (HCC)   . Lymphadenopathy   . Testosterone deficiency   . Anemia   . Vitamin D deficiency   . ED (erectile dysfunction)   . Morbid obesity (HCC)   . OSA (obstructive sleep apnea)     CPAP-17.5 cm water pressure  . Kidney stones     stent, lithotripsy  . Diabetes mellitus     controlled  . Gout   . S/P emergency tracheotomy for assistance in breathing [facility])     at age 70  . Pneumonia     History of Loss of Consciousness: Yes-with afib  Seizure History: No  Cardiac History: Yes-Afib   Allergies:  Allergies  Allergen Reactions  . Colchicine Shortness Of Breath, Swelling and Itching  . Peach Flavor     "Peaches" " swelling of face"     Current Medications: Current Outpatient Prescriptions on File Prior to Visit  Medication Sig Dispense Refill  . allopurinol (ZYLOPRIM) 100 MG tablet Take 1 tablet (100 mg total) by mouth daily. 30 tablet 6  . AMBULATORY NON FORMULARY MEDICATION Medication Name:  CPAP machine with nasal mask and supplies set to 17.5 cm water pressure.  No humidifier.  Sent to [facility].  See sleep study from 2009 attached.  Dx: OSA. 1 Units PRN.  Marland Kitchen. amiodarone (PACERONE) 200 MG tablet Take 1 tablet (200 mg total) by mouth 2 (two) times daily. KEEP OV. 60 tablet 0  . AMRIX 30 MG 24 hr capsule Take 30 mg by mouth as needed for muscle spasms.     Marland Kitchen. aspirin EC 81 MG tablet Take 1 tablet (81 mg total) by mouth daily. 90 tablet 3  . diltiazem (CARDIZEM CD) 240 MG 24 hr  capsule Take 1 capsule (240 mg total) by mouth daily. KEEP OV. 30 capsule 0  . HYDROcodone-acetaminophen (NORCO) 10-325 MG per tablet Take 1 tablet by mouth every 8 (eight) hours as needed. 90 tablet 0  . lisinopril (PRINIVIL,ZESTRIL) 5 MG tablet Take 1 tablet (5 mg total) by mouth daily. 90 tablet 1  . metFORMIN (GLUCOPHAGE) 500 MG tablet Take 1 tablet (500 mg total) by mouth 2 (two) times daily with a meal. 180 tablet 3  . oxyCODONE (ROXICODONE) 15 MG immediate release tablet   0  . pravastatin (PRAVACHOL) 40 MG tablet Take 1 tablet (40 mg total) by mouth daily. 30 tablet 11  . Testosterone (ANDROGEL PUMP) 20.25 MG/ACT (1.62%) GEL PLACE 4 PUMPS ON SKIN EVERY MORNING 150 g 5  . tiZANidine (ZANAFLEX) 4 MG tablet Take 4 mg by mouth once.     Marland Kitchen. VICTOZA 18 MG/3ML SOPN 0.6mg  subcutaneously every day x 1 week, then increase to 1.2 mg daily (36/1.2=30) 6 mL 11  . zolpidem (AMBIEN) 10 MG tablet Take 10 mg by mouth at bedtime.      No current facility-administered medications on file prior to visit.      Previous Psychotropic Medications: Reviewed  Medication  Dose   lexapro-worked better   was on higher dose now at 40mg   Citalopram  upto 200 mg daily   Lorazepam    Zolpidem    Prozac-more anxious    Zoloft-didn't work     Substance Abuse History in the last 12 months: Reviewed  Patient reports he has used all types of drugs in his 20's specifically cocaine.    Social History   Social History  . Marital Status: Married    Spouse Name: N/A  . Number of Children: N/A  . Years of Education: N/A   Social History Main Topics  . Smoking status: Never Smoker   . Smokeless tobacco: None  . Alcohol Use: No  . Drug Use: No     Comment: prior cocaine abuse (heavy) in his 4320s.  denies subsequent use  . Sexual Activity:    Partners: Female    CopyBirth Control/ Protection: None     Comment: on full disability, separated, no regular exercise,walks some, drinks pot of coffee a day.   Other  Topics Concern  . None   Social History Narrative   Drinks one pot of coffee daily.   Regular exercise-no, walks some      [place] in [place] with roommate.   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    Caffeine: Caffeinated Beverages 1 5 hour energy   Family History: Reviewed  Family History  Problem Relation Age of Onset  . Cancer Mother     lung, heavy smoker  . Hypertension Mother   . Hyperlipidemia Mother   . Cancer Father     melanoma  .  Aneurysm Father     cardiac  . Hyperlipidemia Father   . Hypertension Father   . Hyperlipidemia Sister   . Hypertension Sister   . Hyperlipidemia Brother   . Hypertension Brother   . Stroke Other     Psychiatric Specialty Examination: Objective: Appearance: Casual   Eye Contact:: Good   Speech: Clear and Coherent and Normal Rate   Volume: Normal   Mood: euthymic  Affect: Appropriate, Congruent and Full Range   Thought Process: Coherent, Linear and Logical   Orientation: Full   Thought Content: WDL   Suicidal Thoughts: No   Homicidal Thoughts: No   Judgement: Fair   Insight: Good   Psychomotor Activity: Normal   Akathisia: Yes   Handed: Right   Memory-immediate 3/3; recent-2/3  Fund of knowledge-average to above average  Language-Intact  AIMS (if indicated): None   Assets: Communication Skills  Desire for Improvement  Financial Resources/Insurance  Physical Health    Laboratory/X-Ray  Psychological Evaluation(s)   none  Not available    Assessment:  AXIS I  Major Depression, Recurrent severe-improving , 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 GAD. insomnia  AXIS II  No diagnosis   AXIS III  Past Medical History    Diagnosis  Date    .  Atrial fibrillation     .  Lymphadenopathy     .  Testosterone deficiency     .  Anemia     .  Vitamin d deficiency     .  ED (erectile dysfunction)     .  Morbid obesity     .  OSA (obstructive sleep apnea)       CPAP-17.5 cm water  pressure    .  Kidney stones       stent, lithotripsy    .  Lymphadenopathy     .  Diabetes mellitus       controlled    .  Testosterone deficiency     .  Excess or deficiency of vitamin D     .  ED (erectile dysfunction)       AXIS IV  economic problems, educational problems, occupational problems and problems with primary support group   AXIS V  60-moderate symptoms    Treatment Plan/Recommendations:  PLAN:   Continue Lexapro now at a dose of 20 mg twice a day for depression and anxiety Continue Ativan for anxiety. Advised to take lower dose at times. reveiwed sleep hygiene. Not willing to cut down.  Takes it for insomnia as well. He takes [place] from other provider for insomnia at times or alternate with [place].  Says he is trying to look for therapist in [place] .  Keeps himself busy and is not feeling hopeless  He keeps himself active.  More than 50% time spent in counseling and coordination of care including patient education Cautioned and recommended to closely follow with his providers. Follow up in 3 months. call 911 for  Any urgency or suicidal toughts. Time spent: 25 minutes   Thresa Ross, M.D.  09/07/2015 9:08 AM

## 2015-09-12 ENCOUNTER — Other Ambulatory Visit: Payer: Self-pay

## 2015-09-12 MED ORDER — DILTIAZEM HCL ER COATED BEADS 240 MG PO CP24: 240.0000 mg | ORAL_CAPSULE | Freq: Every day | ORAL | Status: DC

## 2015-09-21 ENCOUNTER — Other Ambulatory Visit: Payer: Self-pay | Admitting: Family Medicine

## 2015-10-03 NOTE — Progress Notes (Signed)
HPI: FU atrial fibrillation. Previously presented to [facility] with atrial fibrillation. Based on outside records LV function was normal. Stress echocardiogram in March of 2012 showed no ischemia. Echocardiogram in March of 2014 showed an ejection fraction of 40-45%, mild left atrial enlargement and grade 2 diastolic dysfunction. Study was technically difficult. Nuclear study in April of 2014 at [facility] showed an ejection fraction greater than 65% and normal perfusion. Patient had an outpatient monitor for recurrent palpitations which revealed wide-complex tachycardia. The patient was seen by Dr. Johney Frame and this was felt to be atrial flutter with one-to-one conduction. He was started on flecainide. Followup exercise treadmill showed no induced ventricular arrhythmias but there was a rate related left bundle branch block. Flecanide changed to amiodarone previously at [facility]. Patient felt not to be a candidate for ablation due to size. Hospitalized at [facility] 8/15 with GI bleed and anticoagulation DCed. Patient did have a colonoscopy which revealed pandiverticulosis. He also was transfused 8 units of packed red blood cells. Discharge summary states anticoagulation can be resumed in one week if hemoglobin stable. Patient decided to continue aspirin and avoid Coumadin. Since I last saw him in 2015, the patient denies any dyspnea on exertion, orthopnea, PND, pedal edema, palpitations, syncope or chest pain.   Current Outpatient Prescriptions  Medication Sig Dispense Refill  . allopurinol (ZYLOPRIM) 100 MG tablet Take 1 tablet (100 mg total) by mouth daily. 30 tablet 6  . AMBULATORY NON FORMULARY MEDICATION Medication Name: CPAP machine with nasal mask and supplies set to 17.5 cm water pressure.  No humidifier.  Sent to [facility].  See sleep study from 2009 attached.  Dx: OSA. 1 Units PRN.  Marland Kitchen amiodarone (PACERONE) 200 MG tablet Take 1 tablet (200 mg total) by mouth 2 (two) times daily.  KEEP OV. 60 tablet 0  . AMRIX 30 MG 24 hr capsule Take 30 mg by mouth as needed for muscle spasms.     Marland Kitchen aspirin EC 81 MG tablet Take 1 tablet (81 mg total) by mouth daily. 90 tablet 3  . diltiazem (CARDIZEM CD) 240 MG 24 hr capsule Take 1 capsule (240 mg total) by mouth daily. KEEP OV. 30 capsule 0  . escitalopram (LEXAPRO) 20 MG tablet Take  tablets twice a day. 60 tablet 2  . HYDROcodone-acetaminophen (NORCO) 10-325 MG per tablet Take 1 tablet by mouth every 8 (eight) hours as needed. 90 tablet 0  . lisinopril (PRINIVIL,ZESTRIL) 5 MG tablet Take 1 tablet (5 mg total) by mouth daily. 90 tablet 1  . LORazepam (ATIVAN) 1 MG tablet TAKE 1 TABLET BY MOUTH EVERY 8 HOURS only as needed 90 tablet 1  . metFORMIN (GLUCOPHAGE) 500 MG tablet Take 1 tablet (500 mg total) by mouth 2 (two) times daily with a meal. 180 tablet 3  . oxyCODONE (ROXICODONE) 15 MG immediate release tablet   0  . pravastatin (PRAVACHOL) 40 MG tablet Take 1 tablet (40 mg total) by mouth daily. 30 tablet 11  . Testosterone (ANDROGEL PUMP) 20.25 MG/ACT (1.62%) GEL PLACE 4 PUMPS ON SKIN EVERY MORNING 150 g 5  . tiZANidine (ZANAFLEX) 4 MG tablet Take 4 mg by mouth once.     Marland Kitchen VICTOZA 18 MG/3ML SOPN 0.6mg  subcutaneously every day x 1 week, then increase to 1.2 mg daily (36/1.2=30) 6 mL 11  . zolpidem (AMBIEN) 10 MG tablet Take 10 mg by mouth at bedtime.      No current facility-administered medications for this visit.  Past Medical History  Diagnosis Date  . Paroxysmal atrial fibrillation (HCC)   . Lymphadenopathy   . Testosterone deficiency   . Anemia   . Vitamin D deficiency   . ED (erectile dysfunction)   . Morbid obesity (HCC)   . OSA (obstructive sleep apnea)     CPAP-17.5 cm water pressure  . Kidney stones     stent, lithotripsy  . Diabetes mellitus     controlled  . Gout   . S/P emergency tracheotomy for assistance in breathing [facility](HCC)     at age 89  . Pneumonia     Past Surgical History  Procedure  Laterality Date  . Gastric bypass  2005    600 lbs prior to surgery  . Cholecystectomy    . Tonsillectomy    . Finger surgery      ulnar digital nerve and artery right index finger  . Cystoscopy      retrograde and double J catheter insertion.    Social History   Social History  . Marital Status: Married    Spouse Name: N/A  . Number of Children: N/A  . Years of Education: N/A   Occupational History  . Not on file.   Social History Main Topics  . Smoking status: Never Smoker   . Smokeless tobacco: Not on file  . Alcohol Use: No  . Drug Use: No     Comment: prior cocaine abuse (heavy) in his 3320s.  denies subsequent use  . Sexual Activity:    Partners: Female    CopyBirth Control/ Protection: None     Comment: on full disability, separated, no regular exercise,walks some, drinks pot of coffee a day.   Other Topics Concern  . Not on file   Social History Narrative   Drinks one pot of coffee daily.   Regular exercise-no, walks some      [place] with roommate.   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    Family History  Problem Relation Age of Onset  . Cancer Mother     lung, heavy smoker  . Hypertension Mother   . Hyperlipidemia Mother   . Cancer Father     melanoma  . Aneurysm Father     cardiac  . Hyperlipidemia Father   . Hypertension Father   . Hyperlipidemia Sister   . Hypertension Sister   . Hyperlipidemia Brother   . Hypertension Brother   . Stroke Other     ROS: no fevers or chills, productive cough, hemoptysis, dysphasia, odynophagia, melena, hematochezia, dysuria, hematuria, rash, seizure activity, orthopnea, PND, pedal edema, claudication. Remaining systems are negative.  Physical Exam: Well-developed morbidly obese in no acute distress.  Skin is warm and dry.  HEENT is normal.  Neck is supple.  Chest is clear to auscultation with normal expansion.  Cardiovascular exam is regular rate and rhythm.    Abdominal exam nontender or distended. No masses palpated. Extremities show no edema. neuro grossly intact  ECG -Sinus rhythm at a rate of 58. Left anterior fascicular block. Cannot rule out prior anterior infarct.  Assessment and plan 1 atrial fibrillation-the patient remains in sinus rhythm today. We have previously reviewed need for anticoagulation. CHADS vasc 2. He would therefore benefit from anticoagulation long-term. However he had a life-threatening bleed previously while on xarelto. He would prefer to continue aspirin and understands there is a higher risk of embolic event. Continue amiodarone. Check chest x-ray, liver functions and TSH.  2 hypertension-continue present medications. Blood pressure controlled. 3 morbid obesity-patient counseled on weight loss.  Olga MillersBrian Crenshaw, MD

## 2015-10-11 ENCOUNTER — Telehealth: Payer: Self-pay | Admitting: Family Medicine

## 2015-10-11 ENCOUNTER — Ambulatory Visit (INDEPENDENT_AMBULATORY_CARE_PROVIDER_SITE_OTHER): Payer: BLUE CROSS/BLUE SHIELD | Admitting: Cardiology

## 2015-10-11 ENCOUNTER — Encounter: Payer: Self-pay | Admitting: Cardiology

## 2015-10-11 VITALS — BP 124/84 | HR 58 | Ht 69.0 in | Wt 339.1 lb

## 2015-10-11 DIAGNOSIS — I10 Essential (primary) hypertension: Secondary | ICD-10-CM | POA: Diagnosis not present

## 2015-10-11 DIAGNOSIS — I480 Paroxysmal atrial fibrillation: Secondary | ICD-10-CM | POA: Diagnosis not present

## 2015-10-11 MED ORDER — DILTIAZEM HCL ER COATED BEADS 240 MG PO CP24: 240.0000 mg | ORAL_CAPSULE | Freq: Every day | ORAL | Status: DC

## 2015-10-11 MED ORDER — LISINOPRIL 5 MG PO TABS: ORAL_TABLET | ORAL | Status: DC

## 2015-10-11 MED ORDER — AMIODARONE HCL 200 MG PO TABS: 200.0000 mg | ORAL_TABLET | Freq: Every day | ORAL | Status: DC

## 2015-10-11 MED ORDER — PRAVASTATIN SODIUM 40 MG PO TABS: 40.0000 mg | ORAL_TABLET | Freq: Every day | ORAL | Status: DC

## 2015-10-11 MED ORDER — TESTOSTERONE 20.25 MG/ACT (1.62%) TD GEL: TRANSDERMAL | Status: DC

## 2015-10-11 NOTE — Patient Instructions (Signed)
Medication Instructions:   NO CHANGE  Labwork:  Your physician recommends that you HAVE LAB WORK TODAY  Follow-Up:  Your physician recommends that you schedule a follow-up appointment in: AS NEEDED      

## 2015-10-11 NOTE — Telephone Encounter (Signed)
Patient needs a new rx sent over for Andro Gel to [facility].  He has a new insurance that started on July 1.  Thanks~!

## 2015-10-11 NOTE — Telephone Encounter (Signed)
Refill sent.Keller Mikels [name]  

## 2015-10-12 ENCOUNTER — Telehealth: Payer: Self-pay | Admitting: *Deleted

## 2015-10-12 NOTE — Telephone Encounter (Signed)
Form faxed along with lab values

## 2015-10-12 NOTE — Telephone Encounter (Signed)
Faxing form over to complete

## 2015-10-12 NOTE — Telephone Encounter (Signed)
We can fill but he needs a testosterone level and CBC done.

## 2015-10-13 NOTE — Telephone Encounter (Signed)
Pt called and informed that PA has been started.Rodney PacasBarkley, Mallarie Voorhies Lou­zaLynetta

## 2015-10-23 NOTE — Telephone Encounter (Signed)
Pharm notified and patient has already picked medication up

## 2015-10-26 ENCOUNTER — Ambulatory Visit: Payer: Self-pay | Admitting: Family Medicine

## 2015-11-09 DIAGNOSIS — G5621 Lesion of ulnar nerve, right upper limb: Secondary | ICD-10-CM | POA: Diagnosis not present

## 2015-11-09 DIAGNOSIS — G5603 Carpal tunnel syndrome, bilateral upper limbs: Secondary | ICD-10-CM | POA: Diagnosis not present

## 2015-11-09 DIAGNOSIS — G4700 Insomnia, unspecified: Secondary | ICD-10-CM | POA: Diagnosis not present

## 2015-11-09 DIAGNOSIS — G603 Idiopathic progressive neuropathy: Secondary | ICD-10-CM | POA: Diagnosis not present

## 2015-11-09 DIAGNOSIS — R201 Hypoesthesia of skin: Secondary | ICD-10-CM | POA: Diagnosis not present

## 2015-11-09 DIAGNOSIS — M5412 Radiculopathy, cervical region: Secondary | ICD-10-CM | POA: Diagnosis not present

## 2015-11-24 ENCOUNTER — Other Ambulatory Visit: Payer: Self-pay | Admitting: Family Medicine

## 2015-11-29 ENCOUNTER — Ambulatory Visit (INDEPENDENT_AMBULATORY_CARE_PROVIDER_SITE_OTHER): Payer: BLUE CROSS/BLUE SHIELD | Admitting: Family Medicine

## 2015-11-29 ENCOUNTER — Encounter: Payer: Self-pay | Admitting: Family Medicine

## 2015-11-29 VITALS — BP 88/67 | HR 126 | Temp 98.0°F | Resp 16 | Ht 69.0 in | Wt 376.9 lb

## 2015-11-29 DIAGNOSIS — I8390 Asymptomatic varicose veins of unspecified lower extremity: Secondary | ICD-10-CM | POA: Insufficient documentation

## 2015-11-29 DIAGNOSIS — I8393 Asymptomatic varicose veins of bilateral lower extremities: Secondary | ICD-10-CM | POA: Diagnosis not present

## 2015-11-29 DIAGNOSIS — M722 Plantar fascial fibromatosis: Secondary | ICD-10-CM | POA: Diagnosis not present

## 2015-11-29 MED ORDER — AMBULATORY NON FORMULARY MEDICATION: 0 refills | Status: DC

## 2015-11-29 NOTE — Patient Instructions (Signed)
Thank you for coming in today. Make an appointment soon for orthotics.  Return sooner if needed.    Plantar Fasciitis With Rehab The plantar fascia is a fibrous, ligament-like, soft-tissue structure that spans the bottom of the foot. Plantar fasciitis, also called heel spur syndrome, is a condition that causes pain in the foot due to inflammation of the tissue. SYMPTOMS   Pain and tenderness on the underneath side of the foot.  Pain that worsens with standing or walking. CAUSES  Plantar fasciitis is caused by irritation and injury to the plantar fascia on the underneath side of the foot. Common mechanisms of injury include:  Direct trauma to bottom of the foot.  Damage to a small nerve that runs under the foot where the main fascia attaches to the heel bone.  Stress placed on the plantar fascia due to bone spurs. RISK INCREASES WITH:   Activities that place stress on the plantar fascia (running, jumping, pivoting, or cutting).  Poor strength and flexibility.  Improperly fitted shoes.  Tight calf muscles.  Flat feet.  Failure to warm-up properly before activity.  Obesity. PREVENTION  Warm up and stretch properly before activity.  Allow for adequate recovery between workouts.  Maintain physical fitness:  Strength, flexibility, and endurance.  Cardiovascular fitness.  Maintain a health body weight.  Avoid stress on the plantar fascia.  Wear properly fitted shoes, including arch supports for individuals who have flat feet. PROGNOSIS  If treated properly, then the symptoms of plantar fasciitis usually resolve without surgery. However, occasionally surgery is necessary. RELATED COMPLICATIONS   Recurrent symptoms that may result in a chronic condition.  Problems of the lower back that are caused by compensating for the injury, such as limping.  Pain or weakness of the foot during push-off following surgery.  Chronic inflammation, scarring, and partial or  complete fascia tear, occurring more often from repeated injections. TREATMENT  Treatment initially involves the use of ice and medication to help reduce pain and inflammation. The use of strengthening and stretching exercises may help reduce pain with activity, especially stretches of the Achilles tendon. These exercises may be performed at home or with a therapist. Your caregiver may recommend that you use heel cups of arch supports to help reduce stress on the plantar fascia. Occasionally, corticosteroid injections are given to reduce inflammation. If symptoms persist for greater than 6 months despite non-surgical (conservative), then surgery may be recommended.  MEDICATION   If pain medication is necessary, then nonsteroidal anti-inflammatory medications, such as aspirin and ibuprofen, or other minor pain relievers, such as acetaminophen, are often recommended.  Do not take pain medication within 7 days before surgery.  Prescription pain relievers may be given if deemed necessary by your caregiver. Use only as directed and only as much as you need.  Corticosteroid injections may be given by your caregiver. These injections should be reserved for the most serious cases, because they may only be given a certain number of times. HEAT AND COLD  Cold treatment (icing) relieves pain and reduces inflammation. Cold treatment should be applied for 10 to 15 minutes every 2 to 3 hours for inflammation and pain and immediately after any activity that aggravates your symptoms. Use ice packs or massage the area with a piece of ice (ice massage).  Heat treatment may be used prior to performing the stretching and strengthening activities prescribed by your caregiver, physical therapist, or athletic trainer. Use a heat pack or soak the injury in warm water. SEEK IMMEDIATE MEDICAL  CARE IF:  Treatment seems to offer no benefit, or the condition worsens.  Any medications produce adverse side  effects. EXERCISES RANGE OF MOTION (ROM) AND STRETCHING EXERCISES - Plantar Fasciitis (Heel Spur Syndrome) These exercises may help you when beginning to rehabilitate your injury. Your symptoms may resolve with or without further involvement from your physician, physical therapist or athletic trainer. While completing these exercises, remember:   Restoring tissue flexibility helps normal motion to return to the joints. This allows healthier, less painful movement and activity.  An effective stretch should be held for at least 30 seconds.  A stretch should never be painful. You should only feel a gentle lengthening or release in the stretched tissue. RANGE OF MOTION - Toe Extension, Flexion  Sit with your right / left leg crossed over your opposite knee.  Grasp your toes and gently pull them back toward the top of your foot. You should feel a stretch on the bottom of your toes and/or foot.  Hold this stretch for __________ seconds.  Now, gently pull your toes toward the bottom of your foot. You should feel a stretch on the top of your toes and or foot.  Hold this stretch for __________ seconds. Repeat __________ times. Complete this stretch __________ times per day.  RANGE OF MOTION - Ankle Dorsiflexion, Active Assisted  Remove shoes and sit on a chair that is preferably not on a carpeted surface.  Place right / left foot under knee. Extend your opposite leg for support.  Keeping your heel down, slide your right / left foot back toward the chair until you feel a stretch at your ankle or calf. If you do not feel a stretch, slide your bottom forward to the edge of the chair, while still keeping your heel down.  Hold this stretch for __________ seconds. Repeat __________ times. Complete this stretch __________ times per day.  STRETCH - Gastroc, Standing  Place hands on wall.  Extend right / left leg, keeping the front knee somewhat bent.  Slightly point your toes inward on your back  foot.  Keeping your right / left heel on the floor and your knee straight, shift your weight toward the wall, not allowing your back to arch.  You should feel a gentle stretch in the right / left calf. Hold this position for __________ seconds. Repeat __________ times. Complete this stretch __________ times per day. STRETCH - Soleus, Standing  Place hands on wall.  Extend right / left leg, keeping the other knee somewhat bent.  Slightly point your toes inward on your back foot.  Keep your right / left heel on the floor, bend your back knee, and slightly shift your weight over the back leg so that you feel a gentle stretch deep in your back calf.  Hold this position for __________ seconds. Repeat __________ times. Complete this stretch __________ times per day. STRETCH - Gastrocsoleus, Standing  Note: This exercise can place a lot of stress on your foot and ankle. Please complete this exercise only if specifically instructed by your caregiver.   Place the ball of your right / left foot on a step, keeping your other foot firmly on the same step.  Hold on to the wall or a rail for balance.  Slowly lift your other foot, allowing your body weight to press your heel down over the edge of the step.  You should feel a stretch in your right / left calf.  Hold this position for __________ seconds.  Repeat  this exercise with a slight bend in your right / left knee. Repeat __________ times. Complete this stretch __________ times per day.  STRENGTHENING EXERCISES - Plantar Fasciitis (Heel Spur Syndrome)  These exercises may help you when beginning to rehabilitate your injury. They may resolve your symptoms with or without further involvement from your physician, physical therapist or athletic trainer. While completing these exercises, remember:   Muscles can gain both the endurance and the strength needed for everyday activities through controlled exercises.  Complete these exercises as  instructed by your physician, physical therapist or athletic trainer. Progress the resistance and repetitions only as guided. STRENGTH - Towel Curls  Sit in a chair positioned on a non-carpeted surface.  Place your foot on a towel, keeping your heel on the floor.  Pull the towel toward your heel by only curling your toes. Keep your heel on the floor.  If instructed by your physician, physical therapist or athletic trainer, add ____________________ at the end of the towel. Repeat __________ times. Complete this exercise __________ times per day. STRENGTH - Ankle Inversion  Secure one end of a rubber exercise band/tubing to a fixed object (table, pole). Loop the other end around your foot just before your toes.  Place your fists between your knees. This will focus your strengthening at your ankle.  Slowly, pull your big toe up and in, making sure the band/tubing is positioned to resist the entire motion.  Hold this position for __________ seconds.  Have your muscles resist the band/tubing as it slowly pulls your foot back to the starting position. Repeat __________ times. Complete this exercises __________ times per day.    This information is not intended to replace advice given to you by your health care provider. Make sure you discuss any questions you have with your health care provider.   Document Released: 03/25/2005 Document Revised: 08/09/2014 Document Reviewed: 07/07/2008 Elsevier Interactive Patient Education [employer]

## 2015-11-29 NOTE — Progress Notes (Signed)
   Subjective:    I'm seeing this patient as a consultation for:  METHENEY,CATHERINE, MD   CC: Foot pain bilaterally  HPI: Patient notes a long history of bilateral foot and ankle and leg pain. This is been ongoing for years.  Left leg pain: Patient has pain in the left leg and shin. This is associated with varicose veins that have been present for years. He notes the area in the anterior medial lower leg is swollen and sometimes tender to touch and hypersensitive. He has not had really any treatment for this that he can recall. He does not think this is a problem with nerves says he can feel his feet well. He notes that he does have a pertinent medical history for diabetes.  Bilateral foot pain. Patient has bilateral pain in the plantar heels. This is been ongoing for years. Pain is worse especially with the first step out of bed the morning and in the evening after a long day of walking. He has not tried any treatment yet. Symptoms are mild to moderate but persistent ongoing bothersome. The symptoms do interfere with his ability to exercise.  Past medical history, Surgical history, Family history not pertinant except as noted below, Social history, Allergies, and medications have been entered into the medical record, reviewed, and no changes needed.   Review of Systems: No headache, visual changes, nausea, vomiting, diarrhea, constipation, dizziness, abdominal pain, skin rash, fevers, chills, night sweats, weight loss, swollen lymph nodes, body aches, joint swelling, muscle aches, chest pain, shortness of breath, mood changes, visual or auditory hallucinations.   Objective:    Vitals:   11/29/15 1047  BP: (!) 88/67  Pulse: (!) 126  Resp: 16  Temp: 98 F (36.7 C)   General: Well Developed, well nourished, and in no acute distress.  Morbidly obese Neuro/Psych: Alert and oriented x3, extra-ocular muscles intact, able to move all 4 extremities, sensation grossly intact. Skin: Warm and  dry, no rashes noted.  Respiratory: Not using accessory muscles, speaking in full sentences, trachea midline.  Cardiovascular: Pulses palpable, no extremity edema. Abdomen: Does not appear distended. Extremities: Varicosities present bilateral lower legs. No palpable cords are present. Calf diameters are equal bilaterally. MSK: Feet bilaterally have some pes planus with tenderness to palpation at the plantar medial calcaneus. Foot motion is intact. Pulses capillary refill and sensation are intact distally. Sensation is intact to monofilament testing. Diabetic Foot Exam - Simple   Simple Foot Form Diabetic Foot exam was performed with the following findings:  Yes 11/29/2015 12:13 PM  Visual Inspection No deformities, no ulcerations, no other skin breakdown bilaterally:  Yes Sensation Testing Intact to touch and monofilament testing bilaterally:  Yes Pulse Check Posterior Tibialis and Dorsalis pulse intact bilaterally:  Yes Comments      No results found for this or any previous visit (from the past 24 hour(s)). No results found.  Impression and Recommendations:    Assessment and Plan: 55 y.o. male with  1) symptomatic varicose veins left leg: This is likely the source of his left shin pain. Recommend compression stockings. Prescribe graduated compression stockings that he can take to a medical supply company. Weight loss will certainly help for this. 2) bilateral plantar fasciitis: This is the source of his heel pain. We discussed options. Plan for orthotics, and home exercise program. Return for orthotics the near future.   Discussed warning signs or symptoms. Please see discharge instructions. Patient expresses understanding.  CC: METHENEY,CATHERINE, MD

## 2015-11-30 ENCOUNTER — Ambulatory Visit (INDEPENDENT_AMBULATORY_CARE_PROVIDER_SITE_OTHER): Payer: BLUE CROSS/BLUE SHIELD | Admitting: Family Medicine

## 2015-11-30 ENCOUNTER — Encounter: Payer: Self-pay | Admitting: Family Medicine

## 2015-11-30 VITALS — BP 146/78 | HR 64 | Ht 69.0 in | Wt 375.0 lb

## 2015-11-30 DIAGNOSIS — M722 Plantar fascial fibromatosis: Secondary | ICD-10-CM | POA: Diagnosis not present

## 2015-11-30 NOTE — Patient Instructions (Signed)
Thank you for coming in today. Return as needed.  Try to find a primary care sports medicine doctor in [place]. Look for [facility].

## 2015-11-30 NOTE — Progress Notes (Signed)
    Orthotics Note:   Patient was fitted for a : standard, cushioned, semi-rigid orthotic. The orthotic was heated and afterward the patient stood on the orthotic blank positioned on the orthotic stand. The patient was positioned in subtalar neutral position and 10 degrees of ankle dorsiflexion in a weight bearing stance. After completion of molding, a stable base was applied to the orthotic blank. The blank was ground to a stable position for weight bearing. Size: 14 Base: [facility] Additional Posting and Padding: None The patient ambulated these, and they were very comfortable.  I spent 40 minutes with this patient, greater than 50% was face-to-face time counseling regarding the below diagnosis.

## 2015-12-06 ENCOUNTER — Ambulatory Visit (INDEPENDENT_AMBULATORY_CARE_PROVIDER_SITE_OTHER): Payer: BLUE CROSS/BLUE SHIELD

## 2015-12-06 ENCOUNTER — Encounter (HOSPITAL_COMMUNITY): Payer: Self-pay

## 2015-12-06 ENCOUNTER — Ambulatory Visit (HOSPITAL_COMMUNITY): Payer: BLUE CROSS/BLUE SHIELD | Admitting: Psychiatry

## 2015-12-06 DIAGNOSIS — I10 Essential (primary) hypertension: Secondary | ICD-10-CM | POA: Diagnosis not present

## 2015-12-06 DIAGNOSIS — I480 Paroxysmal atrial fibrillation: Secondary | ICD-10-CM | POA: Diagnosis not present

## 2015-12-06 DIAGNOSIS — I482 Chronic atrial fibrillation: Secondary | ICD-10-CM | POA: Diagnosis not present

## 2015-12-12 ENCOUNTER — Encounter: Payer: Self-pay | Admitting: Family Medicine

## 2015-12-12 ENCOUNTER — Ambulatory Visit (INDEPENDENT_AMBULATORY_CARE_PROVIDER_SITE_OTHER): Payer: BLUE CROSS/BLUE SHIELD | Admitting: Family Medicine

## 2015-12-12 VITALS — BP 138/62 | HR 72 | Ht 69.0 in | Wt 378.0 lb

## 2015-12-12 DIAGNOSIS — I10 Essential (primary) hypertension: Secondary | ICD-10-CM

## 2015-12-12 DIAGNOSIS — E119 Type 2 diabetes mellitus without complications: Secondary | ICD-10-CM

## 2015-12-12 DIAGNOSIS — I480 Paroxysmal atrial fibrillation: Secondary | ICD-10-CM | POA: Diagnosis not present

## 2015-12-12 DIAGNOSIS — E291 Testicular hypofunction: Secondary | ICD-10-CM

## 2015-12-12 DIAGNOSIS — Z0000 Encounter for general adult medical examination without abnormal findings: Secondary | ICD-10-CM | POA: Insufficient documentation

## 2015-12-12 LAB — POCT GLYCOSYLATED HEMOGLOBIN (HGB A1C): Hemoglobin A1C: 6

## 2015-12-12 MED ORDER — LIRAGLUTIDE 18 MG/3ML ~~LOC~~ SOPN: 1.8000 mg | PEN_INJECTOR | Freq: Every day | SUBCUTANEOUS | 5 refills | Status: DC

## 2015-12-12 NOTE — Progress Notes (Deleted)
Subjective:    CC:   HPI:  Diabetes - no hypoglycemic events. No wounds or sores that are not healing well. No increased thirst or urination. Checking glucose at home. Taking medications as prescribed without any side effects.   Dr. Fredda HammedAktar writes his lexapro and ativan.    Past medical history, Surgical history, Family history not pertinant except as noted below, Social history, Allergies, and medications have been entered into the medical record, reviewed, and corrections made.   Review of Systems: No fevers, chills, night sweats, weight loss, chest pain, or shortness of breath.   Objective:    General: Well Developed, well nourished, and in no acute distress.  Neuro: Alert and oriented x3, extra-ocular muscles intact, sensation grossly intact.  HEENT: Normocephalic, atraumatic  Skin: Warm and dry, no rashes. Cardiac: Regular rate and rhythm, no murmurs rubs or gallops, no lower extremity edema.  Respiratory: Clear to auscultation bilaterally. Not using accessory muscles, speaking in full sentences.   Impression and Recommendations:    DM- A1 of 6.0. Well controlled. Continue current regimen.

## 2015-12-12 NOTE — Progress Notes (Signed)
Subjective:    Patient ID: Rodney Bright, male    DOB: [birth year], 55 y.o.   MRN: [id number]  HPI Here for CPE today.    He did want to update me he will be moving to [place] in about 3 weeks. His girlfriend is currently working at [employer] and he is moving up there to be with her. Unfortunately she was not able to find a job here locally. He wanted to come in today just to make sure that he has everything he needs before the move and make sure that he has updated prescriptions.  He has gained about 25 pounds since he was last here in April. He says these been stress eating. He had put a six-month down payment on a rent own home. Unfortunately the owners went into bankruptcy and so he was unable to recover his money. He says it's just been very stressful and says that he does have difficulty with stress eating. No regular exercise at this time.  Diabetes - no hypoglycemic events. No wounds or sores that are not healing well. No increased thirst or urination. Checking glucose at home. Taking medications as prescribed without any side effects.  Atrial fibrillation-doing well overall. No recent chest pain or shortness of breath. He is currently rate controlled on amiodarone and diltiazem. He follows with cardiology regularly. He is on baby aspirin for anticoagulation due to history of GI bleed.  Review of Systems  BP 138/62 (BP Location: Left Arm, Patient Position: Sitting, Cuff Size: Large)   Pulse 72   Ht 5\' 9"  (1.753 m)   Wt (!) 378 lb (171.5 kg)   SpO2 98%   BMI 55.82 kg/m     Allergies  Allergen Reactions  . Colchicine Shortness Of Breath, Swelling and Itching  . Peach Flavor     "Peaches" " swelling of face"    Past Medical History:  Diagnosis Date  . Anemia   . Diabetes mellitus    controlled  . ED (erectile dysfunction)   . Gout   . Kidney stones    stent, lithotripsy  . Lymphadenopathy   . Morbid obesity (HCC)   . OSA (obstructive sleep apnea)    CPAP-17.5  cm water pressure  . Paroxysmal atrial fibrillation (HCC)   . Pneumonia   . S/P emergency tracheotomy for assistance in breathing [facility])    at age 64  . Testosterone deficiency   . Vitamin D deficiency     Past Surgical History:  Procedure Laterality Date  . CHOLECYSTECTOMY    . CYSTOSCOPY     retrograde and double J catheter insertion.  Marland Kitchen FINGER SURGERY     ulnar digital nerve and artery right index finger  . GASTRIC BYPASS  2005   600 lbs prior to surgery  . TONSILLECTOMY      Social History   Social History  . Marital status: Married    Spouse name: N/A  . Number of children: N/A  . Years of education: N/A   Occupational History  . Not on file.   Social History Main Topics  . Smoking status: Never Smoker  . Smokeless tobacco: Not on file  . Alcohol use No  . Drug use: No     Comment: prior cocaine abuse (heavy) in his 30s.  denies subsequent use  . Sexual activity: Yes    Partners: Female    Birth control/ protection: None     Comment: on full disability, separated, no regular exercise,walks some,  drinks pot of coffee a day.   Other Topics Concern  . Not on file   Social History Narrative   Drinks one pot of coffee daily.   Regular exercise-no, walks some      [place]ives in [place] with roommate.   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    Family History  Problem Relation Age of Onset  . Cancer Mother     lung, heavy smoker  . Hypertension Mother   . Hyperlipidemia Mother   . Cancer Father     melanoma  . Aneurysm Father     cardiac  . Hyperlipidemia Father   . Hypertension Father   . Hyperlipidemia Sister   . Hypertension Sister   . Hyperlipidemia Brother   . Hypertension Brother   . Stroke Other     Outpatient Encounter Prescriptions as of 12/12/2015  Medication Sig  . allopurinol (ZYLOPRIM) 100 MG tablet Take 1 tablet (100 mg total) by mouth daily.  . AMBULATORY NON FORMULARY MEDICATION Medication Name: CPAP  machine with nasal mask and supplies set to 17.5 cm water pressure.  No humidifier.  Sent to [facility].  See sleep study from 2009 attached.  Dx: OSA.  Marland Kitchen. AMBULATORY NON FORMULARY MEDICATION Knee high graduated compression stockings medium. I83.93  . amiodarone (PACERONE) 200 MG tablet Take 1 tablet (200 mg total) by mouth daily.  Marland Kitchen. aspirin EC 81 MG tablet Take 1 tablet (81 mg total) by mouth daily.  Marland Kitchen. diltiazem (CARDIZEM CD) 240 MG 24 hr capsule Take 1 capsule (240 mg total) by mouth daily.  Marland Kitchen. escitalopram (LEXAPRO) 20 MG tablet Take 20mg  tablets twice a day.  Marland Kitchen. HYDROcodone-acetaminophen (NORCO) 10-325 MG per tablet Take 1 tablet by mouth every 8 (eight) hours as needed.  . Liraglutide (VICTOZA) 18 MG/3ML SOPN Inject 0.3 mLs (1.8 mg total) into the skin daily.  Marland Kitchen. lisinopril (PRINIVIL,ZESTRIL) 5 MG tablet Take 1 tablet (5 mg total) by mouth daily.  Marland Kitchen. LORazepam (ATIVAN) 1 MG tablet TAKE 1 TABLET BY MOUTH EVERY 8 HOURS only as needed  . metFORMIN (GLUCOPHAGE) 500 MG tablet Take 1 tablet (500 mg total) by mouth 2 (two) times daily with a meal.  . oxyCODONE (ROXICODONE) 15 MG immediate release tablet   . pravastatin (PRAVACHOL) 40 MG tablet Take 1 tablet (40 mg total) by mouth daily.  . Testosterone (ANDROGEL PUMP) 20.25 MG/ACT (1.62%) GEL PLACE 4 PUMPS ON SKIN EVERY MORNING  . tiZANidine (ZANAFLEX) 4 MG tablet Take 4 mg by mouth once.   Marland Kitchen. zolpidem (AMBIEN) 10 MG tablet Take 10 mg by mouth at bedtime.   . [DISCONTINUED] AMRIX 30 MG 24 hr capsule Take 30 mg by mouth as needed for muscle spasms.   . [DISCONTINUED] VICTOZA 18 MG/3ML SOPN 0.6mg  subcutaneously every day x 1 week, then increase to 1.2 mg daily (36/1.2=30)   No facility-administered encounter medications on file as of 12/12/2015.          Objective:   Physical Exam  Constitutional: He is oriented to person, place, and time. He appears well-developed and well-nourished.  HENT:  Head: Normocephalic and atraumatic.  Right Ear: External  ear normal.  Left Ear: External ear normal.  Nose: Nose normal.  Mouth/Throat: Oropharynx is clear and moist.  Eyes: Conjunctivae and EOM are normal. Pupils are equal, round, and reactive to light.  Neck: Normal range of motion. Neck supple. No thyromegaly present.  Cardiovascular: Normal rate, regular rhythm, normal heart sounds and intact  distal pulses.   Pulmonary/Chest: Effort normal and breath sounds normal.  Abdominal: Soft. Bowel sounds are normal. He exhibits no distension and no mass. There is no tenderness. There is no rebound and no guarding.  Musculoskeletal: Normal range of motion.  Lymphadenopathy:    He has no cervical adenopathy.  Neurological: He is alert and oriented to person, place, and time. He has normal reflexes.  Skin: Skin is warm and dry.  Psychiatric: He has a normal mood and affect. His behavior is normal. Judgment and thought content normal.       Assessment & Plan:  CPE  Keep up a regular exercise program and make sure you are eating a healthy diet Try to eat 4 servings of dairy a day, or if you are lactose intolerant take a calcium with vitamin D daily.  Your vaccines are up to date.  Encouraged him to continue to work on reducing food intake since she's had a recent bump in his weight from stress eating. Work on some regular exercise to help reduce stress levels.  Diabetes-well-controlled. 11 A1c 6.0 today but it is a little up a little bit from previous most likely due to his recent stress eating and weight gain. We discussed getting back on track, reducing stress and trying to increase the Victoza to 1.8 mg. We can always consider going up to 2.4 mg if he tolerates this well to help curb appetite. He will need a follow-up in 3-4 months with his new primary care provider. Last eye exam was August 23 of 2017. Last eye exam was made 31st of 2016. Pneumonia and tetanus vaccinations are up-to-date.  Atrial fibrillation diagnosed in 2012. Currently on  amiodarone and diltiazem for rate control. Follows with Dr. Olga Millers, cardiology.  Morbid obesity-continue to work on exercise and weight loss. We'll try increasing the dose of Victoza to see if this helps curb appetite as well. Continue to wear CPAP regularly.  Immunization History  Administered Date(s) Administered  . Influenza,inj,Quad PF,36+ Mos 12/21/2012, 04/17/2015  . Influenza-Unspecified 12/07/2013  . Pneumococcal Conjugate-13 06/28/2013  . Pneumococcal Polysaccharide-23 04/17/2015  . Td 08/30/2008

## 2015-12-18 ENCOUNTER — Telehealth (HOSPITAL_COMMUNITY): Payer: Self-pay | Admitting: *Deleted

## 2015-12-18 NOTE — Telephone Encounter (Addendum)
Pt lvm requesting a call back. No information was given for the nature of the telephone call.  Return telephone call, unable to lvm on pt voicemail, mailbox was full.

## 2015-12-19 NOTE — Telephone Encounter (Signed)
2nd attempt to contact pt. Unable to lvm. Pt left message on 12/18/15 requesting a return telephone call. No information was given for the nature of the call.

## 2015-12-20 ENCOUNTER — Encounter (HOSPITAL_COMMUNITY): Payer: Self-pay | Admitting: Psychiatry

## 2015-12-20 ENCOUNTER — Ambulatory Visit (INDEPENDENT_AMBULATORY_CARE_PROVIDER_SITE_OTHER): Payer: BLUE CROSS/BLUE SHIELD | Admitting: Psychiatry

## 2015-12-20 VITALS — BP 128/80 | HR 71 | Resp 16 | Ht 69.0 in | Wt 373.0 lb

## 2015-12-20 DIAGNOSIS — F411 Generalized anxiety disorder: Secondary | ICD-10-CM

## 2015-12-20 DIAGNOSIS — G4700 Insomnia, unspecified: Secondary | ICD-10-CM | POA: Diagnosis not present

## 2015-12-20 DIAGNOSIS — F331 Major depressive disorder, recurrent, moderate: Secondary | ICD-10-CM

## 2015-12-20 MED ORDER — ESCITALOPRAM OXALATE 20 MG PO TABS: ORAL_TABLET | ORAL | 2 refills | Status: DC

## 2015-12-20 MED ORDER — LORAZEPAM 1 MG PO TABS: ORAL_TABLET | ORAL | 2 refills | Status: DC

## 2015-12-20 NOTE — Progress Notes (Signed)
Patient ID: Rodney RodriguezWilliam J Malina, male   DOB: 10/25/1960, [age] y.o.   MRN: [id number]   [facility] Follow-up Outpatient Visit  Rodney RodriguezWilliam J Harner 04/04/1961  Date: 09/07/2015  HPI Comments: Mr. Morene AntuWaibel is a 55 y/o male with a past psychiatric history significant for symptoms of depression and anxiety. The patient was referred for psychiatric services for medication management.    Patient continues to do reasonable as of now regarding depression. He takes Ativan for the anxiety does does not feel comfortable cutting down the dose of Ativan.  Says he understands the risk . At times takes a lower dose during the day. Also has A fib   Remains busy with his job and also uses C Pap machine at night  considering his anxiety level and atrial fibrillation he continues to take ativan that helps sleep and apprehension.  No side effects reported. He takes Ativan sometimes at nighttime that helps him sleep. Recommend therapist so that he can work on this esteem and depression and the long-term  . Severity: Depression: 6/10 (0=Very depressed; 5=Neutral; 10=Very Happy)  Anxiety- 4/10 (0=no anxiety; 5= moderate/tolerable anxiety; 10= panic attacks) . Duration: Patient has had symptoms over 20 years. . Timing: Anxiety worst at nght. . Context-Patient reports that his mood is worse during the winter. . Modifying factors- Improved with Therapy and animals.  Worsens with family and work stressor.  . Associated signs and symptoms: No psychosis or mania.     Review of Systems  Constitutional: Negative for fever.  Cardiovascular: Negative for chest pain.  Skin: Negative for rash.  Neurological: Negative for tingling and tremors.  Psychiatric/Behavioral: Negative for depression and suicidal ideas.     Vitals:   12/20/15 1010  BP: 128/80  BP Location: Right Arm  Patient Position: Sitting  Cuff Size: Normal  Pulse: 71  Resp: 16  SpO2: 97%  Weight: (!) 373 lb (169.2 kg)  Height: 5\' 9"  (1.753  m)    Physical Exam  Constitutional: No distress.  Obese  Skin: He is not diaphoretic.  Musculoskeletal: Gait & Station: normal Patient leans: N/A  Traumatic Brain Injury: Played college football.   Past Psychiatric History: Reviewed   Past Medical History: Reviewed  Past Medical History:  Diagnosis Date  . Anemia   . Diabetes mellitus    controlled  . ED (erectile dysfunction)   . Gout   . Kidney stones    stent, lithotripsy  . Lymphadenopathy   . Morbid obesity (HCC)   . OSA (obstructive sleep apnea)    CPAP-17.5 cm water pressure  . Paroxysmal atrial fibrillation (HCC)   . Pneumonia   . S/P emergency tracheotomy for assistance in breathing [facility](HCC)    at age 89  . Testosterone deficiency   . Vitamin D deficiency     History of Loss of Consciousness: Yes-with afib  Seizure History: No  Cardiac History: Yes-Afib   Allergies:  Allergies  Allergen Reactions  . Colchicine Shortness Of Breath, Swelling and Itching  . Peach Flavor     "Peaches" " swelling of face"     Current Medications: Current Outpatient Prescriptions on File Prior to Visit  Medication Sig Dispense Refill  . allopurinol (ZYLOPRIM) 100 MG tablet Take 1 tablet (100 mg total) by mouth daily. 30 tablet 0  . AMBULATORY NON FORMULARY MEDICATION Medication Name: CPAP machine with nasal mask and supplies set to 17.5 cm water pressure.  No humidifier.  Sent to [facility].  See sleep study from 2009 attached.  Dx: OSA. 1 Units PRN.  Marland Kitchen AMBULATORY NON FORMULARY MEDICATION Knee high graduated compression stockings medium. I83.93 1 each 0  . amiodarone (PACERONE) 200 MG tablet Take 1 tablet (200 mg total) by mouth daily. 90 tablet 3  . aspirin EC 81 MG tablet Take 1 tablet (81 mg total) by mouth daily. 90 tablet 3  . diltiazem (CARDIZEM CD) 240 MG 24 hr capsule Take 1 capsule (240 mg total) by mouth daily. 90 capsule 3  . HYDROcodone-acetaminophen (NORCO) 10-325 MG per tablet Take 1 tablet by mouth every 8  (eight) hours as needed. 90 tablet 0  . Liraglutide (VICTOZA) 18 MG/3ML SOPN Inject 0.3 mLs (1.8 mg total) into the skin daily. 9 mL 5  . lisinopril (PRINIVIL,ZESTRIL) 5 MG tablet Take 1 tablet (5 mg total) by mouth daily. 90 tablet 3  . metFORMIN (GLUCOPHAGE) 500 MG tablet Take 1 tablet (500 mg total) by mouth 2 (two) times daily with a meal. 180 tablet 3  . oxyCODONE (ROXICODONE) 15 MG immediate release tablet   0  . pravastatin (PRAVACHOL) 40 MG tablet Take 1 tablet (40 mg total) by mouth daily. 90 tablet 3  . Testosterone (ANDROGEL PUMP) 20.25 MG/ACT (1.62%) GEL PLACE 4 PUMPS ON SKIN EVERY MORNING 150 g 5  . tiZANidine (ZANAFLEX) 4 MG tablet Take 4 mg by mouth once.      No current facility-administered medications on file prior to visit.       Previous Psychotropic Medications: Reviewed  Medication  Dose   lexapro-worked better   was on higher dose now at 40mg      Lorazepam  Still taking  Zolpidem  In past  Prozac-more anxious    Zoloft-didn't work     Substance Abuse History in the last 12 months: Reviewed  Patient reports he has used all types of drugs in his 20's specifically cocaine.    Social History   Social History  . Marital status: Married    Spouse name: N/A  . Number of children: N/A  . Years of education: N/A   Social History Main Topics  . Smoking status: Never Smoker  . Smokeless tobacco: Never Used  . Alcohol use No  . Drug use: No     Comment: prior cocaine abuse (heavy) in his 65s.  denies subsequent use  . Sexual activity: Yes    Partners: Female    Birth control/ protection: None     Comment: on full disability, separated, no regular exercise,walks some, drinks pot of coffee a day.   Other Topics Concern  . None   Social History Narrative   Drinks one pot of coffee daily.   Regular exercise-no, walks some      Lives in [place] with roommate.   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    Caffeine: Caffeinated  Beverages 1 5 hour energy   Family History: Reviewed  Family History  Problem Relation Age of Onset  . Cancer Mother     lung, heavy smoker  . Hypertension Mother   . Hyperlipidemia Mother   . Cancer Father     melanoma  . Aneurysm Father     cardiac  . Hyperlipidemia Father   . Hypertension Father   . Hyperlipidemia Sister   . Hypertension Sister   . Hyperlipidemia Brother   . Hypertension Brother   . Stroke Other     Psychiatric Specialty Examination: Objective: Appearance: Casual   Eye Contact:: Good   Speech:  Clear and Coherent and Normal Rate   Volume: Normal   Mood: euthymic  Affect: Appropriate, Congruent and Full Range   Thought Process: Coherent, Linear and Logical   Orientation: Full   Thought Content: WDL   Suicidal Thoughts: No   Homicidal Thoughts: No   Judgement: Fair   Insight: Good   Psychomotor Activity: Normal   Akathisia: Yes   Handed: Right   Memory-immediate 3/3; recent-2/3  Fund of knowledge-average to above average  Language-Intact  AIMS (if indicated): None   Assets: Communication Skills  Desire for Improvement  Financial Resources/Insurance  Physical Health    Laboratory/X-Ray  Psychological Evaluation(s)   none  Not available    Assessment:  AXIS I  Major Depression, Recurrent severe-improving , 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 GAD. insomnia  AXIS II  No diagnosis   AXIS III  Past Medical History    Diagnosis  Date    .  Atrial fibrillation     .  Lymphadenopathy     .  Testosterone deficiency     .  Anemia     .  Vitamin d deficiency     .  ED (erectile dysfunction)     .  Morbid obesity     .  OSA (obstructive sleep apnea)       CPAP-17.5 cm water pressure    .  Kidney stones       stent, lithotripsy    .  Lymphadenopathy     .  Diabetes mellitus       controlled    .  Testosterone deficiency     .  Excess or deficiency of vitamin D     .  ED (erectile dysfunction)       AXIS IV  economic  problems, educational problems, occupational problems and problems with primary support group   AXIS V  60-moderate symptoms    Treatment Plan/Recommendations:  PLAN:   Continue Lexapro now at a dose of 20 mg twice a day for depression and anxiety Continue Ativan for anxiety. Advised to take lower dose at times. reveiwed sleep hygiene. Not willing to cut down.  Takes it for insomnia as well. Reviewed sleep hygiene   .  Keeps himself busy and is not feeling hopeless  He keeps himself active.  More than 50% time spent in counseling and coordination of care including patient education Cautioned and recommended to closely follow with his providers. Follow up in 3 months. call 911 for  Any urgency or suicidal toughts. Time spent: 25 minutes   Thresa Ross, M.D.  12/20/2015 10:21 AM

## 2015-12-21 ENCOUNTER — Telehealth: Payer: Self-pay

## 2015-12-21 NOTE — Telephone Encounter (Signed)
Ok to sent?

## 2015-12-21 NOTE — Telephone Encounter (Signed)
Rodney Bright called and states he would like to go back on Fusion plus. Is it ok to send to pharmacy?

## 2015-12-22 MED ORDER — FUSION PLUS PO CAPS: 1.0000 | ORAL_CAPSULE | Freq: Every day | ORAL | 3 refills | Status: DC

## 2015-12-22 NOTE — Telephone Encounter (Signed)
Patient advised.  Medication sent to pharmacy. 

## 2015-12-25 ENCOUNTER — Telehealth: Payer: Self-pay | Admitting: *Deleted

## 2015-12-25 NOTE — Telephone Encounter (Signed)
Key: [id number] PA initiated for fusion plus

## 2015-12-26 DIAGNOSIS — E119 Type 2 diabetes mellitus without complications: Secondary | ICD-10-CM | POA: Diagnosis not present

## 2015-12-26 DIAGNOSIS — E291 Testicular hypofunction: Secondary | ICD-10-CM | POA: Diagnosis not present

## 2015-12-26 DIAGNOSIS — I480 Paroxysmal atrial fibrillation: Secondary | ICD-10-CM | POA: Diagnosis not present

## 2015-12-26 DIAGNOSIS — Z0000 Encounter for general adult medical examination without abnormal findings: Secondary | ICD-10-CM | POA: Diagnosis not present

## 2015-12-26 DIAGNOSIS — I10 Essential (primary) hypertension: Secondary | ICD-10-CM | POA: Diagnosis not present

## 2015-12-26 LAB — CBC WITH DIFFERENTIAL/PLATELET
Basophils Absolute: 0 cells/uL (ref 0–200)
Basophils Relative: 0 %
Eosinophils Absolute: 182 cells/uL (ref 15–500)
Eosinophils Relative: 2 %
HCT: 42 % (ref 38.5–50.0)
Hemoglobin: 13.6 g/dL (ref 13.2–17.1)
Lymphocytes Relative: 24 %
Lymphs Abs: 2184 cells/uL (ref 850–3900)
MCH: 28.3 pg (ref 27.0–33.0)
MCHC: 32.4 g/dL (ref 32.0–36.0)
MCV: 87.3 fL (ref 80.0–100.0)
MPV: 11.3 fL (ref 7.5–12.5)
Monocytes Absolute: 728 cells/uL (ref 200–950)
Monocytes Relative: 8 %
Neutro Abs: 6006 cells/uL (ref 1500–7800)
Neutrophils Relative %: 66 %
Platelets: 200 10*3/uL (ref 140–400)
RBC: 4.81 MIL/uL (ref 4.20–5.80)
RDW: 14.6 % (ref 11.0–15.0)
WBC: 9.1 10*3/uL (ref 3.8–10.8)

## 2015-12-26 NOTE — Telephone Encounter (Signed)
Fusion plus is not a covered benefit.called and let patient know. Patient voiced understanding

## 2015-12-27 LAB — TESTOSTERONE: Testosterone: 803 ng/dL (ref 250–827)

## 2015-12-27 LAB — TSH: TSH: 4.28 mIU/L (ref 0.40–4.50)

## 2015-12-27 LAB — COMPLETE METABOLIC PANEL WITH GFR
ALT: 15 U/L (ref 9–46)
AST: 21 U/L (ref 10–35)
Albumin: 4.2 g/dL (ref 3.6–5.1)
Alkaline Phosphatase: 77 U/L (ref 40–115)
BUN: 36 mg/dL — ABNORMAL HIGH (ref 7–25)
CO2: 23 mmol/L (ref 20–31)
Calcium: 9.1 mg/dL (ref 8.6–10.3)
Chloride: 107 mmol/L (ref 98–110)
Creat: 1.59 mg/dL — ABNORMAL HIGH (ref 0.70–1.33)
GFR, Est African American: 55 mL/min — ABNORMAL LOW (ref >=60)
GFR, Est Non African American: 47 mL/min — ABNORMAL LOW (ref >=60)
Glucose, Bld: 101 mg/dL — ABNORMAL HIGH (ref 65–99)
Potassium: 4.6 mmol/L (ref 3.5–5.3)
Sodium: 139 mmol/L (ref 135–146)
Total Bilirubin: 0.5 mg/dL (ref 0.2–1.2)
Total Protein: 6.8 g/dL (ref 6.1–8.1)

## 2015-12-27 LAB — HEPATIC FUNCTION PANEL
ALT: 14 U/L (ref 9–46)
AST: 18 U/L (ref 10–35)
Albumin: 4.2 g/dL (ref 3.6–5.1)
Alkaline Phosphatase: 80 U/L (ref 40–115)
Bilirubin, Direct: 0.1 mg/dL (ref <=0.2)
Indirect Bilirubin: 0.4 mg/dL (ref 0.2–1.2)
Total Bilirubin: 0.5 mg/dL (ref 0.2–1.2)
Total Protein: 6.7 g/dL (ref 6.1–8.1)

## 2015-12-27 LAB — LIPID PANEL
Cholesterol: 145 mg/dL (ref 125–200)
HDL: 38 mg/dL — ABNORMAL LOW (ref >=40)
LDL Cholesterol: 77 mg/dL (ref <130)
Total CHOL/HDL Ratio: 3.8 Ratio (ref <=5.0)
Triglycerides: 152 mg/dL — ABNORMAL HIGH (ref <150)
VLDL: 30 mg/dL (ref <30)

## 2015-12-27 LAB — PSA: PSA: 0.8 ng/mL (ref <=4.0)

## 2015-12-27 NOTE — Addendum Note (Signed)
Addended by: Deno EtienneBARKLEY, Alyssah Algeo L on: 12/27/2015 01:56 PM   Modules accepted: Orders

## 2016-01-01 ENCOUNTER — Encounter: Payer: Self-pay | Admitting: *Deleted

## 2016-01-08 ENCOUNTER — Other Ambulatory Visit: Payer: Self-pay | Admitting: Family Medicine

## 2016-01-09 ENCOUNTER — Other Ambulatory Visit: Payer: Self-pay | Admitting: *Deleted

## 2016-01-09 MED ORDER — METFORMIN HCL 500 MG PO TABS: 500.0000 mg | ORAL_TABLET | Freq: Two times a day (BID) | ORAL | 0 refills | Status: DC

## 2016-02-01 ENCOUNTER — Telehealth: Payer: Self-pay | Admitting: *Deleted

## 2016-02-01 MED ORDER — LIRAGLUTIDE 18 MG/3ML ~~LOC~~ SOPN: 1.8000 mg | PEN_INJECTOR | Freq: Every day | SUBCUTANEOUS | 5 refills | Status: DC

## 2016-02-01 NOTE — Telephone Encounter (Signed)
Spoke w/pharmacist and was informed that the script will need to be change to 3 pack so that he can get 3 pens instead of 2. Will change script to reflect change and send to pharmacy.Rodney PacasBarkley, Rodney Murtaugh TennantLynetta

## 2016-02-12 DIAGNOSIS — F4323 Adjustment disorder with mixed anxiety and depressed mood: Secondary | ICD-10-CM | POA: Diagnosis not present

## 2016-02-13 ENCOUNTER — Other Ambulatory Visit: Payer: Self-pay | Admitting: Family Medicine

## 2016-02-20 ENCOUNTER — Ambulatory Visit (INDEPENDENT_AMBULATORY_CARE_PROVIDER_SITE_OTHER): Payer: BLUE CROSS/BLUE SHIELD | Admitting: Psychiatry

## 2016-02-20 ENCOUNTER — Encounter (HOSPITAL_COMMUNITY): Payer: Self-pay | Admitting: Psychiatry

## 2016-02-20 VITALS — BP 128/76 | HR 75 | Resp 16 | Ht 69.0 in | Wt 388.0 lb

## 2016-02-20 DIAGNOSIS — Z808 Family history of malignant neoplasm of other organs or systems: Secondary | ICD-10-CM

## 2016-02-20 DIAGNOSIS — Z823 Family history of stroke: Secondary | ICD-10-CM

## 2016-02-20 DIAGNOSIS — Z794 Long term (current) use of insulin: Secondary | ICD-10-CM

## 2016-02-20 DIAGNOSIS — F5102 Adjustment insomnia: Secondary | ICD-10-CM | POA: Diagnosis not present

## 2016-02-20 DIAGNOSIS — F411 Generalized anxiety disorder: Secondary | ICD-10-CM

## 2016-02-20 DIAGNOSIS — Z91018 Allergy to other foods: Secondary | ICD-10-CM | POA: Diagnosis not present

## 2016-02-20 DIAGNOSIS — Z79899 Other long term (current) drug therapy: Secondary | ICD-10-CM

## 2016-02-20 DIAGNOSIS — Z801 Family history of malignant neoplasm of trachea, bronchus and lung: Secondary | ICD-10-CM

## 2016-02-20 DIAGNOSIS — F4323 Adjustment disorder with mixed anxiety and depressed mood: Secondary | ICD-10-CM | POA: Diagnosis not present

## 2016-02-20 DIAGNOSIS — Z7982 Long term (current) use of aspirin: Secondary | ICD-10-CM

## 2016-02-20 DIAGNOSIS — F331 Major depressive disorder, recurrent, moderate: Secondary | ICD-10-CM | POA: Diagnosis not present

## 2016-02-20 DIAGNOSIS — Z8249 Family history of ischemic heart disease and other diseases of the circulatory system: Secondary | ICD-10-CM

## 2016-02-20 MED ORDER — BUPROPION HCL ER (SR) 100 MG PO TB12: 100.0000 mg | ORAL_TABLET | Freq: Every day | ORAL | 0 refills | Status: DC

## 2016-02-20 MED ORDER — LORAZEPAM 1 MG PO TABS: ORAL_TABLET | ORAL | 2 refills | Status: DC

## 2016-02-20 MED ORDER — ESCITALOPRAM OXALATE 20 MG PO TABS: ORAL_TABLET | ORAL | 2 refills | Status: DC

## 2016-02-20 NOTE — Progress Notes (Signed)
Patient ID: Rodney Bright, male   DOB: [birth year], 55 y.o.   MRN: [id number]   [facility] Follow-up Outpatient Visit  Rodney Bright July 15, 1960  Date: 02/20/2016  HPI Comments: Rodney Bright is a 55 y/o male with a past psychiatric history significant for symptoms of depression and anxiety. The patient was referred for psychiatric services for medication management.    Depression: Patient is endorsing down symptoms feeling sad and withdrawn. He is using his sleep apnea machine. He is talking her therapist and also feeling somewhat down because of her recent breakup decreased energy is not feeling hopeless or suicidal  Remains busy with his job and also uses C Pap machine at night  considering his anxiety level and atrial fibrillation he continues to take ativan that helps sleep and apprehension. He has not gone or prescription and keeps that medication either at or below the prescription  No side effects reported. He takes Ativan sometimes at nighttime that helps him sleep. Self-esteem is somewhat low  . Severity: Depression: 5/10 (0=Very depressed; 5=Neutral; 10=Very Happy) ( worsened) Anxiety- 4/10 (0=no anxiety; 5= moderate/tolerable anxiety; 10= panic attacks) . Duration: Patient has had symptoms over 20 years. . Timing: Anxiety worst at nght. . Context-Patient reports that his mood is worse during the winter. Also recent breakup . Modifying factors- Improves with Therapy and animals.  Worsens with family and work stressor.  . Associated signs and symptoms: No psychosis or mania.     Review of Systems  Constitutional: Negative for fever.  Cardiovascular: Negative for palpitations.  Skin: Negative for rash.  Neurological: Negative for tingling and tremors.  Psychiatric/Behavioral: Negative for depression and suicidal ideas.     Vitals:   02/20/16 1531  BP: 128/76  BP Location: Right Arm  Patient Position: Sitting  Cuff Size: Normal  Pulse: 75  Resp: 16   SpO2: 97%  Weight: (!) 388 lb (176 kg)  Height: 5\' 9"  (1.753 m)    Physical Exam  Constitutional: No distress.  Obese  Skin: He is not diaphoretic.  Musculoskeletal: Gait & Station: normal Patient leans: N/A  Traumatic Brain Injury: Played college football.   Past Psychiatric History: Reviewed   Past Medical History: Reviewed  Past Medical History:  Diagnosis Date  . Anemia   . Diabetes mellitus    controlled  . ED (erectile dysfunction)   . Gout   . Kidney stones    stent, lithotripsy  . Lymphadenopathy   . Morbid obesity (HCC)   . OSA (obstructive sleep apnea)    CPAP-17.5 cm water pressure  . Paroxysmal atrial fibrillation (HCC)   . Pneumonia   . S/P emergency tracheotomy for assistance in breathing [facility])    at age 44  . Testosterone deficiency   . Vitamin D deficiency     History of Loss of Consciousness: Yes-with afib  Seizure History: No  Cardiac History: Yes-Afib   Allergies:  Allergies  Allergen Reactions  . Colchicine Shortness Of Breath, Swelling and Itching  . Peach Flavor     "Peaches" " swelling of face"     Current Medications: Current Outpatient Prescriptions on File Prior to Visit  Medication Sig Dispense Refill  . allopurinol (ZYLOPRIM) 100 MG tablet Take 1 tablet (100 mg total) by mouth daily. 30 tablet 0  . AMBULATORY NON FORMULARY MEDICATION Medication Name: CPAP machine with nasal mask and supplies set to 17.5 cm water pressure.  No humidifier.  Sent to [facility].  See sleep study from 2009 attached.  Dx: OSA. 1 Units PRN.  Marland Kitchen. AMBULATORY NON FORMULARY MEDICATION Knee high graduated compression stockings medium. I83.93 1 each 0  . amiodarone (PACERONE) 200 MG tablet Take 1 tablet (200 mg total) by mouth daily. 90 tablet 3  . aspirin EC 81 MG tablet Take 1 tablet (81 mg total) by mouth daily. 90 tablet 3  . diltiazem (CARDIZEM CD) 240 MG 24 hr capsule Take 1 capsule (240 mg total) by mouth daily. 90 capsule 3  . HYDROcodone-acetaminophen  (NORCO) 10-325 MG per tablet Take 1 tablet by mouth every 8 (eight) hours as needed. 90 tablet 0  . Iron-FA-B Cmp-C-Biot-Probiotic (FUSION PLUS) CAPS Take 1 capsule by mouth daily. 90 capsule 3  . liraglutide (VICTOZA) 18 MG/3ML SOPN Inject 0.3 mLs (1.8 mg total) into the skin daily. 3 pen 5  . lisinopril (PRINIVIL,ZESTRIL) 5 MG tablet Take 1 tablet (5 mg total) by mouth daily. 90 tablet 3  . metFORMIN (GLUCOPHAGE) 500 MG tablet Take 1 tablet (500 mg total) by mouth 2 (two) times daily with a meal. 180 tablet 0  . oxyCODONE (ROXICODONE) 15 MG immediate release tablet   0  . pravastatin (PRAVACHOL) 40 MG tablet Take 1 tablet (40 mg total) by mouth daily. 90 tablet 3  . Testosterone (ANDROGEL PUMP) 20.25 MG/ACT (1.62%) GEL PLACE 4 PUMPS ON SKIN EVERY MORNING 150 g 5  . tiZANidine (ZANAFLEX) 4 MG tablet Take 4 mg by mouth once.      No current facility-administered medications on file prior to visit.       Previous Psychotropic Medications: Reviewed  Medication  Dose   lexapro-worked better   was on higher dose now at 40mg      Lorazepam  Still taking  Zolpidem  In past  Prozac-more anxious    Zoloft-didn't work     Substance Abuse History in the last 12 months: Reviewed  Patient reports he has used all types of drugs in his 20's specifically cocaine.    Social History   Social History  . Marital status: Married    Spouse name: N/A  . Number of children: N/A  . Years of education: N/A   Social History Main Topics  . Smoking status: Never Smoker  . Smokeless tobacco: Never Used  . Alcohol use No  . Drug use: No     Comment: prior cocaine abuse (heavy) in his 7220s.  denies subsequent use  . Sexual activity: Yes    Partners: Female    Birth control/ protection: None     Comment: on full disability, separated, no regular exercise,walks some, drinks pot of coffee a day.   Other Topics Concern  . None   Social History Narrative   Drinks one pot of coffee daily.   Regular  exercise-no, walks some      [place].   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    Caffeine: Caffeinated Beverages 1 5 hour energy   Family History: Reviewed  Family History  Problem Relation Age of Onset  . Cancer Mother     lung, heavy smoker  . Hypertension Mother   . Hyperlipidemia Mother   . Cancer Father     melanoma  . Aneurysm Father     cardiac  . Hyperlipidemia Father   . Hypertension Father   . Hyperlipidemia Sister   . Hypertension Sister   . Hyperlipidemia Brother   . Hypertension Brother   . Stroke Other  Psychiatric Specialty Examination: Objective: Appearance: Casual   Eye Contact:: Good   Speech: Clear and Coherent and Normal Rate   Volume: Normal   Mood: depressed  Affect: Appropriate, Congruent and constricted  Thought Process: Coherent, Linear and Logical   Orientation: Full   Thought Content: WDL   Suicidal Thoughts: No   Homicidal Thoughts: No   Judgement: Fair   Insight: Good   Psychomotor Activity: Normal   Akathisia: Yes   Handed: Right   Memory-immediate 3/3; recent-2/3  Fund of knowledge-average to above average  Language-Intact  AIMS (if indicated): None   Assets: Communication Skills  Desire for Improvement  Financial Resources/Insurance  Physical Health    Laboratory/X-Ray  Psychological Evaluation(s)   none  Not available    Assessment:  AXIS I  Major Depression, Recurrent severe-improving , 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 GAD. insomnia  AXIS II  No diagnosis   AXIS III  Past Medical History    Diagnosis  Date    .  Atrial fibrillation     .  Lymphadenopathy     .  Testosterone deficiency     .  Anemia     .  Vitamin d deficiency     .  ED (erectile dysfunction)     .  Morbid obesity     .  OSA (obstructive sleep apnea)       CPAP-17.5 cm water pressure    .  Kidney stones       stent, lithotripsy    .  Lymphadenopathy     .  Diabetes  mellitus       controlled    .  Testosterone deficiency     .  Excess or deficiency of vitamin D     .  ED (erectile dysfunction)       AXIS IV  economic problems, educational problems, occupational problems and problems with primary support group   AXIS V  60-moderate symptoms    Treatment Plan/Recommendations:  PLAN:   Continue Lexapro now at a dose of 20 mg twice a day for depression and anxiety Augment with wellbutrin sr 100mg  for depression.  Continue Ativan for anxiety. Advised to take lower dose at times. reveiwed sleep hygiene.   Takes it for insomnia as well. Reviewed sleep hygiene   .  Keeps himself busy and is not feeling hopeless  He keeps himself active.  More than 50% time spent in counseling and coordination of care including patient education Cautioned and recommended to closely follow with his providers. Follow up in 3-4 weeks or earlier if needed. Early follow up due to medication change done today   call 911 for  Any urgency or suicidal toughts. Time spent: 25 minutes   Thresa RossNADEEM Juniel Groene, M.D.  02/20/2016 4:02 PM

## 2016-02-27 DIAGNOSIS — F4323 Adjustment disorder with mixed anxiety and depressed mood: Secondary | ICD-10-CM | POA: Diagnosis not present

## 2016-03-04 DIAGNOSIS — F4323 Adjustment disorder with mixed anxiety and depressed mood: Secondary | ICD-10-CM | POA: Diagnosis not present

## 2016-03-06 ENCOUNTER — Ambulatory Visit (INDEPENDENT_AMBULATORY_CARE_PROVIDER_SITE_OTHER): Payer: BLUE CROSS/BLUE SHIELD | Admitting: Psychiatry

## 2016-03-06 ENCOUNTER — Other Ambulatory Visit: Payer: Self-pay | Admitting: Family Medicine

## 2016-03-06 ENCOUNTER — Encounter (HOSPITAL_COMMUNITY): Payer: Self-pay | Admitting: Psychiatry

## 2016-03-06 ENCOUNTER — Telehealth: Payer: Self-pay | Admitting: *Deleted

## 2016-03-06 VITALS — BP 128/76 | HR 74 | Resp 18 | Ht 69.0 in | Wt 388.0 lb

## 2016-03-06 DIAGNOSIS — F5102 Adjustment insomnia: Secondary | ICD-10-CM

## 2016-03-06 DIAGNOSIS — Z8249 Family history of ischemic heart disease and other diseases of the circulatory system: Secondary | ICD-10-CM

## 2016-03-06 DIAGNOSIS — Z79899 Other long term (current) drug therapy: Secondary | ICD-10-CM

## 2016-03-06 DIAGNOSIS — R39198 Other difficulties with micturition: Secondary | ICD-10-CM

## 2016-03-06 DIAGNOSIS — F331 Major depressive disorder, recurrent, moderate: Secondary | ICD-10-CM | POA: Diagnosis not present

## 2016-03-06 DIAGNOSIS — Z888 Allergy status to other drugs, medicaments and biological substances status: Secondary | ICD-10-CM

## 2016-03-06 DIAGNOSIS — Z801 Family history of malignant neoplasm of trachea, bronchus and lung: Secondary | ICD-10-CM

## 2016-03-06 DIAGNOSIS — Z9102 Food additives allergy status: Secondary | ICD-10-CM

## 2016-03-06 DIAGNOSIS — Z823 Family history of stroke: Secondary | ICD-10-CM

## 2016-03-06 DIAGNOSIS — F411 Generalized anxiety disorder: Secondary | ICD-10-CM

## 2016-03-06 DIAGNOSIS — I480 Paroxysmal atrial fibrillation: Secondary | ICD-10-CM

## 2016-03-06 MED ORDER — BUPROPION HCL ER (SR) 200 MG PO TB12: 200.0000 mg | ORAL_TABLET | Freq: Every day | ORAL | 1 refills | Status: DC

## 2016-03-06 MED ORDER — AMBULATORY NON FORMULARY MEDICATION: 2 refills | Status: DC

## 2016-03-06 NOTE — Telephone Encounter (Signed)
Pt would like referrals for urology and cardiology.  Also he would like medication that Dr. Linford ArnoldMetheney gave him a few years ago sent to his pharmacy. It was a compound med. Will print and fax.[name], Rodney Basaldua LoviliaLynetta

## 2016-03-06 NOTE — Progress Notes (Signed)
Patient ID: Rodney Bright, male   DOB: [birth year], 55 y.o.   MRN: [id number]   [facility] Behavioral Health Follow-up Outpatient Visit  Rodney Bright 1960-11-22  Date: 02/20/2016  HPI Comments: [name] is a 55 y/o male with a past psychiatric history significant for symptoms of depression and anxiety. The patient was referred for psychiatric services for medication management.     Last visit we added Wellbutrin for his depression because he was feeling sad and withdrawn it did help somewhat but he feels the dose slowly continues to therapy Anxiety is manageable he still worries about the breakup in the relationship yet able to [place] but it did not work also he came back He continues take Ativan for his anxiety Insomnia he works on sleep hygiene also takes Ativan at night Severe depression as 5.5 out of 10. 10 being no depression       Review of Systems  Constitutional: Negative for fever.  Cardiovascular: Negative for chest pain.  Skin: Negative for rash.  Psychiatric/Behavioral: Positive for depression. Negative for suicidal ideas.     Vitals:   03/06/16 1305  BP: 128/76  BP Location: Right Arm  Patient Position: Sitting  Cuff Size: Normal  Pulse: 74  Resp: 18  SpO2: 94%  Weight: (!) 388 lb (176 kg)  Height: 5\' 9"  (1.753 m)    Physical Exam  Constitutional: No distress.  Obese  Skin: He is not diaphoretic.    Past Medical History: Reviewed  Past Medical History:  Diagnosis Date  . Anemia   . Diabetes mellitus    controlled  . ED (erectile dysfunction)   . Gout   . Kidney stones    stent, lithotripsy  . Lymphadenopathy   . Morbid obesity (HCC)   . OSA (obstructive sleep apnea)    CPAP-17.5 cm water pressure  . Paroxysmal atrial fibrillation (HCC)   . Pneumonia   . S/P emergency tracheotomy for assistance in breathing [facility])    at age 89  . Testosterone deficiency   . Vitamin D deficiency      Allergies:  Allergies  Allergen Reactions  .  Colchicine Shortness Of Breath, Swelling and Itching  . Peach Flavor     "Peaches" " swelling of face"     Current Medications: Current Outpatient Prescriptions on File Prior to Visit  Medication Sig Dispense Refill  . allopurinol (ZYLOPRIM) 100 MG tablet Take 1 tablet (100 mg total) by mouth daily. 30 tablet 0  . AMBULATORY NON FORMULARY MEDICATION Medication Name: CPAP machine with nasal mask and supplies set to 17.5 cm water pressure.  No humidifier.  Sent to [facility] [employer].  See sleep study from 2009 attached.  Dx: OSA. 1 Units PRN.  Marland Kitchen AMBULATORY NON FORMULARY MEDICATION Knee high graduated compression stockings medium. I83.93 1 each 0  . amiodarone (PACERONE) 200 MG tablet Take 1 tablet (200 mg total) by mouth daily. 90 tablet 3  . aspirin EC 81 MG tablet Take 1 tablet (81 mg total) by mouth daily. 90 tablet 3  . diltiazem (CARDIZEM CD) 240 MG 24 hr capsule Take 1 capsule (240 mg total) by mouth daily. 90 capsule 3  . escitalopram (LEXAPRO) 20 MG tablet Take 20mg  tablets twice a day. 60 tablet 2  . HYDROcodone-acetaminophen (NORCO) 10-325 MG per tablet Take 1 tablet by mouth every 8 (eight) hours as needed. 90 tablet 0  . Iron-FA-B Cmp-C-Biot-Probiotic (FUSION PLUS) CAPS Take 1 capsule by mouth daily. 90 capsule 3  . liraglutide (VICTOZA)  18 MG/3ML SOPN Inject 0.3 mLs (1.8 mg total) into the skin daily. 3 pen 5  . lisinopril (PRINIVIL,ZESTRIL) 5 MG tablet Take 1 tablet (5 mg total) by mouth daily. 90 tablet 3  . LORazepam (ATIVAN) 1 MG tablet TAKE 1 TABLET BY MOUTH EVERY 8 HOURS only as needed 90 tablet 2  . metFORMIN (GLUCOPHAGE) 500 MG tablet Take 1 tablet (500 mg total) by mouth 2 (two) times daily with a meal. 180 tablet 0  . oxyCODONE (ROXICODONE) 15 MG immediate release tablet   0  . pravastatin (PRAVACHOL) 40 MG tablet Take 1 tablet (40 mg total) by mouth daily. 90 tablet 3  . Testosterone (ANDROGEL PUMP) 20.25 MG/ACT (1.62%) GEL PLACE 4 PUMPS ON SKIN EVERY MORNING 150 g 5  .  tiZANidine (ZANAFLEX) 4 MG tablet Take 4 mg by mouth once.      No current facility-administered medications on file prior to visit.         Social History   Social History  . Marital status: Married    Spouse name: N/A  . Number of children: N/A  . Years of education: N/A   Social History Main Topics  . Smoking status: Never Smoker  . Smokeless tobacco: Never Used  . Alcohol use No  . Drug use: No     Comment: prior cocaine abuse (heavy) in his 2920s.  denies subsequent use  . Sexual activity: Yes    Partners: Female    Birth control/ protection: None     Comment: on full disability, separated, no regular exercise,walks some, drinks pot of coffee a day.   Other Topics Concern  . None   Social History Narrative   Drinks one pot of coffee daily.   Regular exercise-no, walks some      [place] roommate.   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    Caffeine: Caffeinated Beverages 1 5 hour energy   Family History: Reviewed  Family History  Problem Relation Age of Onset  . Cancer Mother     lung, heavy smoker  . Hypertension Mother   . Hyperlipidemia Mother   . Cancer Father     melanoma  . Aneurysm Father     cardiac  . Hyperlipidemia Father   . Hypertension Father   . Hyperlipidemia Sister   . Hypertension Sister   . Hyperlipidemia Brother   . Hypertension Brother   . Stroke Other     Psychiatric Specialty Examination: Objective: Appearance: Casual   Eye Contact:: Good   Speech: Clear and Coherent and Normal Rate   Volume: Normal   Mood: somewhat down. Not hopeless  Affect: constricted  Thought Process: Coherent, Linear and Logical   Orientation: Full   Thought Content: WDL   Suicidal Thoughts: No   Homicidal Thoughts: No   Judgement: Fair   Insight: Good   Psychomotor Activity: Normal   Akathisia: Yes   Handed: Right   Memory-immediate 3/3; recent-2/3  Fund of knowledge-average to above average   Language-Intact  AIMS (if indicated): None   Assets: Communication Skills  Desire for Improvement  Financial Resources/Insurance  Physical Health    Laboratory/X-Ray  Psychological Evaluation(s)   none  Not available    Assessment:  AXIS I  Major Depression, Recurrent severe-improving , 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 GAD. insomnia  AXIS II  No diagnosis   AXIS III  Past Medical History    Diagnosis  Date    .  Atrial fibrillation     .  Lymphadenopathy     .  Testosterone deficiency     .  Anemia     .  Vitamin d deficiency     .  ED (erectile dysfunction)     .  Morbid obesity     .  OSA (obstructive sleep apnea)       CPAP-17.5 cm water pressure    .  Kidney stones       stent, lithotripsy    .  Lymphadenopathy     .  Diabetes mellitus       controlled    .  Testosterone deficiency     .  Excess or deficiency of vitamin D     .  ED (erectile dysfunction)       AXIS IV  economic problems, educational problems, occupational problems and problems with primary support group   AXIS V  60-moderate symptoms    Treatment Plan/Recommendations:  PLAN:   1. Depression: continue lexapro. He has . Will increase wellbutrin to 200mg  2. Anxiety: continue ativan. He has and lexapro.  3. Insomnia: reviewed sleep hygiene. Continue ativan. Work in therapy for relationship stress.  Follow up in 4-6 weeks or earlier if needed.    Thresa RossNADEEM Vyla Pint, M.D.  03/06/2016 1:18 PM

## 2016-03-07 ENCOUNTER — Ambulatory Visit (HOSPITAL_COMMUNITY): Payer: Self-pay | Admitting: Psychiatry

## 2016-03-11 ENCOUNTER — Ambulatory Visit (HOSPITAL_COMMUNITY): Payer: Self-pay | Admitting: Psychiatry

## 2016-03-11 DIAGNOSIS — F4323 Adjustment disorder with mixed anxiety and depressed mood: Secondary | ICD-10-CM | POA: Diagnosis not present

## 2016-03-13 ENCOUNTER — Other Ambulatory Visit: Payer: Self-pay | Admitting: Family Medicine

## 2016-03-14 ENCOUNTER — Ambulatory Visit (HOSPITAL_COMMUNITY): Payer: Self-pay | Admitting: Psychiatry

## 2016-03-15 DIAGNOSIS — N401 Enlarged prostate with lower urinary tract symptoms: Secondary | ICD-10-CM | POA: Diagnosis not present

## 2016-03-15 DIAGNOSIS — N138 Other obstructive and reflux uropathy: Secondary | ICD-10-CM | POA: Diagnosis not present

## 2016-03-15 DIAGNOSIS — R300 Dysuria: Secondary | ICD-10-CM | POA: Diagnosis not present

## 2016-03-18 DIAGNOSIS — F4323 Adjustment disorder with mixed anxiety and depressed mood: Secondary | ICD-10-CM | POA: Diagnosis not present

## 2016-03-23 DIAGNOSIS — H66001 Acute suppurative otitis media without spontaneous rupture of ear drum, right ear: Secondary | ICD-10-CM | POA: Diagnosis not present

## 2016-03-23 DIAGNOSIS — J0140 Acute pansinusitis, unspecified: Secondary | ICD-10-CM | POA: Diagnosis not present

## 2016-03-26 DIAGNOSIS — F4323 Adjustment disorder with mixed anxiety and depressed mood: Secondary | ICD-10-CM | POA: Diagnosis not present

## 2016-04-11 ENCOUNTER — Ambulatory Visit (HOSPITAL_COMMUNITY): Payer: Self-pay | Admitting: Psychiatry

## 2016-04-11 NOTE — Progress Notes (Deleted)
HPI: FU atrial fibrillation. Previously presented to [facility] with atrial fibrillation. Based on outside records LV function was normal. Stress echocardiogram in March of 2012 showed no ischemia. Echocardiogram in March of 2014 showed an ejection fraction of 40-45%, mild left atrial enlargement and grade 2 diastolic dysfunction. Study was technically difficult. Nuclear study in April of 2014 at [facility] showed an ejection fraction greater than 65% and normal perfusion. Patient had an outpatient monitor for recurrent palpitations which revealed wide-complex tachycardia. The patient was seen by Dr. Johney Frame and this was felt to be atrial flutter with one-to-one conduction. He was started on flecainide. Followup exercise treadmill showed no induced ventricular arrhythmias but there was a rate related left bundle branch block. Flecanide changed to amiodarone previously at [facility]. Patient felt not to be a candidate for ablation due to size. Hospitalized at [facility] 8/15 with GI bleed and anticoagulation DCed. Patient did have a colonoscopy which revealed pandiverticulosis. He also was transfused 8 units of packed red blood cells. Discharge summary states anticoagulation can be resumed in one week if hemoglobin stable. Patient decided to continue aspirin and avoid Coumadin. Since I last saw him  Current Outpatient Prescriptions  Medication Sig Dispense Refill  . allopurinol (ZYLOPRIM) 100 MG tablet Take 1 tablet (100 mg total) by mouth daily. 30 tablet 0  . AMBULATORY NON FORMULARY MEDICATION Medication Name: CPAP machine with nasal mask and supplies set to 17.5 cm water pressure.  No humidifier.  Sent to [employer].  See sleep study from 2009 attached.  Dx: OSA. 1 Units PRN.  Marland Kitchen AMBULATORY NON FORMULARY MEDICATION Knee high graduated compression stockings medium. I83.93 1 each 0  . AMBULATORY NON FORMULARY MEDICATION Medication Name: Cyclobenzaprine 2% gabapentin 3% Baclofen 2%  Diclofenac  3% Qty# 120 ml Sig: apply small amount topically to effected area 2-3 times a day as directed [phone] (Fax) 120 mL 2  . amiodarone (PACERONE) 200 MG tablet Take 1 tablet (200 mg total) by mouth daily. 90 tablet 3  . aspirin EC 81 MG tablet Take 1 tablet (81 mg total) by mouth daily. 90 tablet 3  . buPROPion (WELLBUTRIN SR) 200 MG 12 hr tablet Take 1 tablet (200 mg total) by mouth daily. 30 tablet 1  . diltiazem (CARDIZEM CD) 240 MG 24 hr capsule Take 1 capsule (240 mg total) by mouth daily. 90 capsule 3  . escitalopram (LEXAPRO) 20 MG tablet Take 20mg  tablets twice a day. 60 tablet 2  . HYDROcodone-acetaminophen (NORCO) 10-325 MG per tablet Take 1 tablet by mouth every 8 (eight) hours as needed. 90 tablet 0  . Iron-FA-B Cmp-C-Biot-Probiotic (FUSION PLUS) CAPS Take 1 capsule by mouth daily. 90 capsule 3  . liraglutide (VICTOZA) 18 MG/3ML SOPN Inject 0.3 mLs (1.8 mg total) into the skin daily. 3 pen 5  . lisinopril (PRINIVIL,ZESTRIL) 5 MG tablet Take 1 tablet (5 mg total) by mouth daily. 90 tablet 3  . LORazepam (ATIVAN) 1 MG tablet TAKE 1 TABLET BY MOUTH EVERY 8 HOURS only as needed 90 tablet 2  . metFORMIN (GLUCOPHAGE) 500 MG tablet Take 1 tablet (500 mg total) by mouth 2 (two) times daily with a meal. 180 tablet 0  . oxyCODONE (ROXICODONE) 15 MG immediate release tablet   0  . pravastatin (PRAVACHOL) 40 MG tablet Take 1 tablet (40 mg total) by mouth daily. 90 tablet 3  . Testosterone (ANDROGEL PUMP) 20.25 MG/ACT (1.62%) GEL PLACE 4 PUMPS ON SKIN EVERY MORNING 150 g 5  .  tiZANidine (ZANAFLEX) 4 MG tablet Take 4 mg by mouth once.      No current facility-administered medications for this visit.      Past Medical History:  Diagnosis Date  . Anemia   . Diabetes mellitus    controlled  . ED (erectile dysfunction)   . Gout   . Kidney stones    stent, lithotripsy  . Lymphadenopathy   . Morbid obesity (HCC)   . OSA (obstructive sleep apnea)    CPAP-17.5 cm water pressure  .  Paroxysmal atrial fibrillation (HCC)   . Pneumonia   . S/P emergency tracheotomy for assistance in breathing [facility](HCC)    at age 89  . Testosterone deficiency   . Vitamin D deficiency     Past Surgical History:  Procedure Laterality Date  . CHOLECYSTECTOMY    . CYSTOSCOPY     retrograde and double J catheter insertion.  Marland Kitchen. FINGER SURGERY     ulnar digital nerve and artery right index finger  . GASTRIC BYPASS  2005   600 lbs prior to surgery  . TONSILLECTOMY      Social History   Social History  . Marital status: Married    Spouse name: N/A  . Number of children: N/A  . Years of education: N/A   Occupational History  . Not on file.   Social History Main Topics  . Smoking status: Never Smoker  . Smokeless tobacco: Never Used  . Alcohol use No  . Drug use: No     Comment: prior cocaine abuse (heavy) in his 5620s.  denies subsequent use  . Sexual activity: Yes    Partners: Female    Birth control/ protection: None     Comment: on full disability, separated, no regular exercise,walks some, drinks pot of coffee a day.   Other Topics Concern  . Not on file   Social History Narrative   Drinks one pot of coffee daily.   Regular exercise-no, walks some      [place].   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    Family History  Problem Relation Age of Onset  . Cancer Mother     lung, heavy smoker  . Hypertension Mother   . Hyperlipidemia Mother   . Cancer Father     melanoma  . Aneurysm Father     cardiac  . Hyperlipidemia Father   . Hypertension Father   . Hyperlipidemia Sister   . Hypertension Sister   . Hyperlipidemia Brother   . Hypertension Brother   . Stroke Other     ROS: no fevers or chills, productive cough, hemoptysis, dysphasia, odynophagia, melena, hematochezia, dysuria, hematuria, rash, seizure activity, orthopnea, PND, pedal edema, claudication. Remaining systems are negative.  Physical  Exam: Well-developed well-nourished in no acute distress.  Skin is warm and dry.  HEENT is normal.  Neck is supple.  Chest is clear to auscultation with normal expansion.  Cardiovascular exam is regular rate and rhythm.  Abdominal exam nontender or distended. No masses palpated. Extremities show no edema. neuro grossly intact  ECG

## 2016-04-16 ENCOUNTER — Encounter (HOSPITAL_COMMUNITY): Payer: Self-pay | Admitting: Psychiatry

## 2016-04-16 ENCOUNTER — Ambulatory Visit (INDEPENDENT_AMBULATORY_CARE_PROVIDER_SITE_OTHER): Payer: BLUE CROSS/BLUE SHIELD | Admitting: Psychiatry

## 2016-04-16 VITALS — BP 128/80 | HR 74 | Resp 16 | Ht 69.0 in | Wt 381.0 lb

## 2016-04-16 DIAGNOSIS — F5102 Adjustment insomnia: Secondary | ICD-10-CM | POA: Diagnosis not present

## 2016-04-16 DIAGNOSIS — Z801 Family history of malignant neoplasm of trachea, bronchus and lung: Secondary | ICD-10-CM

## 2016-04-16 DIAGNOSIS — Z79899 Other long term (current) drug therapy: Secondary | ICD-10-CM

## 2016-04-16 DIAGNOSIS — F411 Generalized anxiety disorder: Secondary | ICD-10-CM | POA: Diagnosis not present

## 2016-04-16 DIAGNOSIS — Z823 Family history of stroke: Secondary | ICD-10-CM

## 2016-04-16 DIAGNOSIS — Z8489 Family history of other specified conditions: Secondary | ICD-10-CM

## 2016-04-16 DIAGNOSIS — Z8249 Family history of ischemic heart disease and other diseases of the circulatory system: Secondary | ICD-10-CM

## 2016-04-16 DIAGNOSIS — F331 Major depressive disorder, recurrent, moderate: Secondary | ICD-10-CM | POA: Diagnosis not present

## 2016-04-16 DIAGNOSIS — Z79891 Long term (current) use of opiate analgesic: Secondary | ICD-10-CM

## 2016-04-16 DIAGNOSIS — Z91018 Allergy to other foods: Secondary | ICD-10-CM

## 2016-04-16 MED ORDER — ESCITALOPRAM OXALATE 20 MG PO TABS: ORAL_TABLET | ORAL | 1 refills | Status: DC

## 2016-04-16 MED ORDER — LORAZEPAM 1 MG PO TABS
ORAL_TABLET | ORAL | 1 refills | Status: DC
Start: 2016-04-16 — End: 2016-06-07

## 2016-04-16 MED ORDER — BUPROPION HCL ER (SR) 150 MG PO TB12: 300.0000 mg | ORAL_TABLET | Freq: Every day | ORAL | 1 refills | Status: DC

## 2016-04-16 NOTE — Progress Notes (Signed)
Patient ID: [place], male   DOB: [birth year], 58 y.o.   MRN: [id number]   [facility] Follow-up Outpatient Visit  [place] Jul 25, 1960  Date: 02/20/2016  HPI Comments: [place] is a 56 y/o male with a past psychiatric history significant for symptoms of depression and anxiety. The patient was referred for psychiatric services for medication management.    Last visit increase Wellbutrin to 200 mg that has helped some. He wants to increase it further. He went to [place] to patch up. This friend that helped and had reasonable holidays   Insomnia he works on sleep hygiene also takes Ativan at night Severe depression as 6 out of 10. 10 being no depression GAd: fluctates depending upon stress level Still feels down a motivated times wants to change his lifestyle but apparently feels somewhat stuck and [place] is also an added factor for him being depressed he suffers according to him for seasonal depression   Review of Systems  Constitutional: Negative for fever.  Cardiovascular: Negative for palpitations.  Gastrointestinal: Negative for nausea.  Skin: Negative for rash.  Psychiatric/Behavioral: Positive for depression. Negative for suicidal ideas.     Vitals:   04/16/16 1102  BP: 128/80  BP Location: Right Arm  Patient Position: Sitting  Cuff Size: Large  Pulse: 74  Resp: 16  SpO2: 94%  Weight: (!) 381 lb (172.8 kg)  Height: 5\' 9"  (1.753 m)    Physical Exam  Constitutional: No distress.  Obese  Skin: He is not diaphoretic.    Past Medical History: Reviewed  Past Medical History:  Diagnosis Date  . Anemia   . Diabetes mellitus    controlled  . ED (erectile dysfunction)   . Gout   . Kidney stones    stent, lithotripsy  . Lymphadenopathy   . Morbid obesity (HCC)   . OSA (obstructive sleep apnea)    CPAP-17.5 cm water pressure  . Paroxysmal atrial fibrillation (HCC)   . Pneumonia   . S/P emergency tracheotomy for assistance in breathing  [facility])    at age 62  . Testosterone deficiency   . Vitamin D deficiency      Allergies:  Allergies  Allergen Reactions  . Colchicine Shortness Of Breath, Swelling and Itching  . Peach Flavor     "Peaches" " swelling of face"     Current Medications: Current Outpatient Prescriptions on File Prior to Visit  Medication Sig Dispense Refill  . allopurinol (ZYLOPRIM) 100 MG tablet Take 1 tablet (100 mg total) by mouth daily. 30 tablet 0  . AMBULATORY NON FORMULARY MEDICATION Medication Name: CPAP machine with nasal mask and supplies set to 17.5 cm water pressure.  No humidifier.  Sent to [facility].  See sleep study from 2009 attached.  Dx: OSA. 1 Units PRN.  Marland Kitchen AMBULATORY NON FORMULARY MEDICATION Knee high graduated compression stockings medium. I83.93 1 each 0  . AMBULATORY NON FORMULARY MEDICATION Medication Name: Cyclobenzaprine 2% gabapentin 3% Baclofen 2%  Diclofenac 3% Qty# 120 ml Sig: apply small amount topically to effected area 2-3 times a day as directed [phone] (Fax) 120 mL 2  . amiodarone (PACERONE) 200 MG tablet Take 1 tablet (200 mg total) by mouth daily. 90 tablet 3  . aspirin EC 81 MG tablet Take 1 tablet (81 mg total) by mouth daily. 90 tablet 3  . diltiazem (CARDIZEM CD) 240 MG 24 hr capsule Take 1 capsule (240 mg total) by mouth daily. 90 capsule 3  . HYDROcodone-acetaminophen (NORCO) 10-325  MG per tablet Take 1 tablet by mouth every 8 (eight) hours as needed. 90 tablet 0  . Iron-FA-B Cmp-C-Biot-Probiotic (FUSION PLUS) CAPS Take 1 capsule by mouth daily. 90 capsule 3  . liraglutide (VICTOZA) 18 MG/3ML SOPN Inject 0.3 mLs (1.8 mg total) into the skin daily. 3 pen 5  . lisinopril (PRINIVIL,ZESTRIL) 5 MG tablet Take 1 tablet (5 mg total) by mouth daily. 90 tablet 3  . metFORMIN (GLUCOPHAGE) 500 MG tablet Take 1 tablet (500 mg total) by mouth 2 (two) times daily with a meal. 180 tablet 0  . oxyCODONE (ROXICODONE) 15 MG immediate release tablet   0  . pravastatin  (PRAVACHOL) 40 MG tablet Take 1 tablet (40 mg total) by mouth daily. 90 tablet 3  . Testosterone (ANDROGEL PUMP) 20.25 MG/ACT (1.62%) GEL PLACE 4 PUMPS ON SKIN EVERY MORNING 150 g 5  . tiZANidine (ZANAFLEX) 4 MG tablet Take 4 mg by mouth once.      No current facility-administered medications on file prior to visit.         Social History   Social History  . Marital status: Married    Spouse name: N/A  . Number of children: N/A  . Years of education: N/A   Social History Main Topics  . Smoking status: Never Smoker  . Smokeless tobacco: Never Used  . Alcohol use No  . Drug use: No     Comment: prior cocaine abuse (heavy) in his 3020s.  denies subsequent use  . Sexual activity: Yes    Partners: Female    Birth control/ protection: None     Comment: on full disability, separated, no regular exercise,walks some, drinks pot of coffee a day.   Other Topics Concern  . None   Social History Narrative   Drinks one pot of coffee daily.   Regular exercise-no, walks some      Lives in [place] with roommate.   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    Caffeine: Caffeinated Beverages 1 5 hour energy   Family History: Reviewed  Family History  Problem Relation Age of Onset  . Cancer Mother     lung, heavy smoker  . Hypertension Mother   . Hyperlipidemia Mother   . Cancer Father     melanoma  . Aneurysm Father     cardiac  . Hyperlipidemia Father   . Hypertension Father   . Hyperlipidemia Sister   . Hypertension Sister   . Hyperlipidemia Brother   . Hypertension Brother   . Stroke Other     Psychiatric Specialty Examination: Objective: Appearance: Casual   Eye Contact:: Good   Speech: Clear and Coherent and Normal Rate   Volume: Normal   Mood: somewhat down. Not hopeless  Affect: constricted  Thought Process: Coherent, Linear and Logical   Orientation: Full   Thought Content: WDL   Suicidal Thoughts: No   Homicidal Thoughts: No    Judgement: Fair   Insight: Good   Psychomotor Activity: Normal   Akathisia: Yes   Handed: Right   Memory-immediate 3/3; recent-2/3  Fund of knowledge-average to above average  Language-Intact  AIMS (if indicated): None   Assets: Communication Skills  Desire for Improvement  Financial Resources/Insurance  Physical Health    Laboratory/X-Ray  Psychological Evaluation(s)   none  Not available    Assessment:  AXIS I  Major Depression, Recurrent severe-improving , 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 GAD. insomnia  AXIS II  No diagnosis  AXIS III  Past Medical History    Diagnosis  Date    .  Atrial fibrillation     .  Lymphadenopathy     .  Testosterone deficiency     .  Anemia     .  Vitamin d deficiency     .  ED (erectile dysfunction)     .  Morbid obesity     .  OSA (obstructive sleep apnea)       CPAP-17.5 cm water pressure    .  Kidney stones       stent, lithotripsy    .  Lymphadenopathy     .  Diabetes mellitus       controlled    .  Testosterone deficiency     .  Excess or deficiency of vitamin D     .  ED (erectile dysfunction)       AXIS IV  economic problems, educational problems, occupational problems and problems with primary support group   AXIS V  60-moderate symptoms    Treatment Plan/Recommendations:  PLAN:   1. Depression:some improvement only. : continue lexapro. He has . Increase wellbutrin 300mg  (150 ) 2 tablets 2. Anxiety: fluctatuates continue lexapro and ativan. Avoid increasing dose of ativan 3. Insomnia: reviewed sleep hygiene. Continue ativan. Follow up in 10weeks or earlier if needed.    Thresa Ross, M.D.  04/16/2016 11:19 AM

## 2016-04-17 ENCOUNTER — Ambulatory Visit: Payer: Self-pay | Admitting: Cardiology

## 2016-04-23 DIAGNOSIS — R350 Frequency of micturition: Secondary | ICD-10-CM | POA: Diagnosis not present

## 2016-04-23 DIAGNOSIS — N401 Enlarged prostate with lower urinary tract symptoms: Secondary | ICD-10-CM | POA: Diagnosis not present

## 2016-04-23 DIAGNOSIS — I10 Essential (primary) hypertension: Secondary | ICD-10-CM | POA: Diagnosis not present

## 2016-04-23 DIAGNOSIS — R300 Dysuria: Secondary | ICD-10-CM | POA: Diagnosis not present

## 2016-04-23 DIAGNOSIS — F4323 Adjustment disorder with mixed anxiety and depressed mood: Secondary | ICD-10-CM | POA: Diagnosis not present

## 2016-04-27 ENCOUNTER — Other Ambulatory Visit: Payer: Self-pay | Admitting: Family Medicine

## 2016-04-29 DIAGNOSIS — F4323 Adjustment disorder with mixed anxiety and depressed mood: Secondary | ICD-10-CM | POA: Diagnosis not present

## 2016-04-30 ENCOUNTER — Ambulatory Visit (INDEPENDENT_AMBULATORY_CARE_PROVIDER_SITE_OTHER): Payer: BLUE CROSS/BLUE SHIELD | Admitting: Family Medicine

## 2016-04-30 ENCOUNTER — Encounter: Payer: Self-pay | Admitting: Family Medicine

## 2016-04-30 VITALS — BP 111/77 | HR 70 | Ht 69.0 in | Wt 381.0 lb

## 2016-04-30 DIAGNOSIS — Z0000 Encounter for general adult medical examination without abnormal findings: Secondary | ICD-10-CM | POA: Diagnosis not present

## 2016-04-30 DIAGNOSIS — E119 Type 2 diabetes mellitus without complications: Secondary | ICD-10-CM | POA: Diagnosis not present

## 2016-04-30 DIAGNOSIS — Z23 Encounter for immunization: Secondary | ICD-10-CM

## 2016-04-30 DIAGNOSIS — E1142 Type 2 diabetes mellitus with diabetic polyneuropathy: Secondary | ICD-10-CM

## 2016-04-30 DIAGNOSIS — E65 Localized adiposity: Secondary | ICD-10-CM | POA: Diagnosis not present

## 2016-04-30 DIAGNOSIS — R040 Epistaxis: Secondary | ICD-10-CM

## 2016-04-30 LAB — POCT GLYCOSYLATED HEMOGLOBIN (HGB A1C): Hemoglobin A1C: 6.2

## 2016-04-30 MED ORDER — LIRAGLUTIDE 18 MG/3ML ~~LOC~~ SOPN: 2.4000 mg | PEN_INJECTOR | Freq: Every day | SUBCUTANEOUS | 5 refills | Status: DC

## 2016-04-30 MED ORDER — BLOOD GLUCOSE MONITOR KIT: PACK | 0 refills | Status: AC

## 2016-04-30 MED ORDER — AMBULATORY NON FORMULARY MEDICATION: 2 refills | Status: DC

## 2016-04-30 NOTE — Patient Instructions (Signed)
Keep up a regular exercise program and make sure you are eating a healthy diet Try to eat 4 servings of dairy a day, or if you are lactose intolerant take a calcium with vitamin D daily.  Your vaccines are up to date.   

## 2016-04-30 NOTE — Progress Notes (Signed)
Subjective:    CC: CPE  HPI:  56 yo males comes In for complete physical exam. When I last saw him he was actually moving to [place] but is now back in the area and wants to pick up where we left off with his health care.  He is still having some problems with chronic nosebleeds particularly in the right nostril. At first it was out to be secondary to maybe the CPAP causing some irritation. He tried using the mupirocin ointment and does use a management a fire on his CPAP but continues to have chronic nosebleeds. Is requesting referral to ENT.  Hypogonadism-due to recheck testosterone levels.  One of his medications is a compounded medication for referral neuropathy.    Diabetes - no hypoglycemic events. No wounds or sores that are not healing well. No increased thirst or urination. Checking glucose at home. Taking medications as prescribed without any side effects. He did request a new glucometer and kit to be sent to his pharmacy.   BP 111/77   Pulse 70   Ht _0  (1.753 m)   Wt (!) 381 lb (172.8 kg)   SpO2 98%   BMI 56.26 kg/m     Allergies  Allergen Reactions  . Colchicine Shortness Of Breath, Swelling and Itching  . Peach Flavor     "Peaches" " swelling of face"    Past Medical History:  Diagnosis Date  . Anemia   . Diabetes mellitus    controlled  . ED (erectile dysfunction)   . Gout   . Kidney stones    stent, lithotripsy  . Lymphadenopathy   . Morbid obesity ([place])   . OSA (obstructive sleep apnea)    CPAP-17.5 cm water pressure  . Paroxysmal atrial fibrillation (HCC)   . Pneumonia   . S/P emergency tracheotomy for assistance in breathing [facility])    at age 47  . Testosterone deficiency   . Vitamin D deficiency     Past Surgical History:  Procedure Laterality Date  . CHOLECYSTECTOMY    . CYSTOSCOPY     retrograde and double J catheter insertion.  Marland Kitchen FINGER SURGERY     ulnar digital nerve and artery right index finger  . GASTRIC BYPASS  2005   600 lbs  prior to surgery  . TONSILLECTOMY      Social History   Social History  . Marital status: Married    Spouse name: N/A  . Number of children: N/A  . Years of education: N/A   Occupational History  . Not on file.   Social History Main Topics  . Smoking status: Never Smoker  . Smokeless tobacco: Never Used  . Alcohol use No  . Drug use: No     Comment: prior cocaine abuse (heavy) in his 61s.  denies subsequent use  . Sexual activity: Yes    Partners: Female    Birth control/ protection: None     Comment: on full disability, separated, no regular exercise,walks some, drinks pot of coffee a day.   Other Topics Concern  . Not on file   Social History Narrative   Drinks one pot of coffee daily.   Regular exercise-no, walks some      Lives in [place] with roommate.   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    Family History  Problem Relation Age of Onset  . Cancer Mother     lung, heavy smoker  . Hypertension Mother   .  Hyperlipidemia Mother   . Cancer Father     melanoma  . Aneurysm Father     cardiac  . Hyperlipidemia Father   . Hypertension Father   . Hyperlipidemia Sister   . Hypertension Sister   . Hyperlipidemia Brother   . Hypertension Brother   . Stroke Other     Outpatient Encounter Prescriptions as of 04/30/2016  Medication Sig  . allopurinol (ZYLOPRIM) 100 MG tablet Take 1 tablet (100 mg total) by mouth daily.  . AMBULATORY NON FORMULARY MEDICATION Medication Name: CPAP machine with nasal mask and supplies set to 17.5 cm water pressure.  No humidifier.  Sent to [facility].  See sleep study from 2009 attached.  Dx: OSA.  Marland Kitchen AMBULATORY NON FORMULARY MEDICATION Knee high graduated compression stockings medium. I83.93  . AMBULATORY NON FORMULARY MEDICATION Medication Name: Cyclobenzaprine 2% gabapentin 3% Baclofen 2%  Diclofenac 3% Qty# 120 ml Sig: apply small amount topically to effected area 2-3 times a day as  directed [phone] (Fax)  . amiodarone (PACERONE) 200 MG tablet Take 1 tablet (200 mg total) by mouth daily.  . ANDROGEL PUMP 20.25 MG/ACT (1.62%) GEL place 4 pumps on the skin every morning  . buPROPion (WELLBUTRIN SR) 150 MG 12 hr tablet Take 2 tablets (300 mg total) by mouth daily.  Marland Kitchen diltiazem (CARDIZEM CD) 240 MG 24 hr capsule Take 1 capsule (240 mg total) by mouth daily.  Marland Kitchen escitalopram (LEXAPRO) 20 MG tablet Take 36m tablets twice a day.  .Marland KitchenHYDROcodone-acetaminophen (NORCO) 10-325 MG per tablet Take 1 tablet by mouth every 8 (eight) hours as needed.  . liraglutide (VICTOZA) 18 MG/3ML SOPN Inject 0.3 mLs (1.8 mg total) into the skin daily.  .Marland Kitchenlisinopril (PRINIVIL,ZESTRIL) 5 MG tablet Take 1 tablet (5 mg total) by mouth daily.  .Marland KitchenLORazepam (ATIVAN) 1 MG tablet TAKE 1 TABLET BY MOUTH EVERY 8 HOURS only as needed  . metFORMIN (GLUCOPHAGE) 500 MG tablet Take 1 tablet (500 mg total) by mouth 2 (two) times daily with a meal.  . oxyCODONE (ROXICODONE) 15 MG immediate release tablet   . pravastatin (PRAVACHOL) 40 MG tablet Take 1 tablet (40 mg total) by mouth daily.  .Marland KitchentiZANidine (ZANAFLEX) 4 MG tablet Take 4 mg by mouth once.   . [DISCONTINUED] aspirin EC 81 MG tablet Take 1 tablet (81 mg total) by mouth daily.  . [DISCONTINUED] Iron-FA-B Cmp-C-Biot-Probiotic (FUSION PLUS) CAPS Take 1 capsule by mouth daily.   No facility-administered encounter medications on file as of 04/30/2016.         Review of Systems: No fevers, chills, night sweats, weight loss, chest pain, or shortness of breath.   Objective:    Physical Exam  Constitutional: He is oriented to person, place, and time. He appears well-developed and well-nourished.  HENT:  Head: Normocephalic and atraumatic.  Right Ear: External ear normal.  Left Ear: External ear normal.  Nose: Nose normal.  Mouth/Throat: Oropharynx is clear and moist.  Eyes: Conjunctivae and EOM are normal. Pupils are equal, round, and reactive to  light.  Neck: Normal range of motion. Neck supple. No thyromegaly present.  Cardiovascular: Normal rate, regular rhythm, normal heart sounds and intact distal pulses.   Pulmonary/Chest: Effort normal and breath sounds normal.  Abdominal: Soft. Bowel sounds are normal. He exhibits no distension and no mass. There is no tenderness. There is no rebound and no guarding.  Musculoskeletal: Normal range of motion.  Lymphadenopathy:    He has no cervical adenopathy.  Neurological: He is  alert and oriented to person, place, and time. He has normal reflexes.  Skin: Skin is warm and dry.  Psychiatric: He has a normal mood and affect. His behavior is normal. Judgment and thought content normal.     Impression and Recommendations:    CPE Keep up a regular exercise program and make sure you are eating a healthy diet Try to eat 4 servings of dairy a day, or if you are lactose intolerant take a calcium with vitamin D daily.  Your vaccines are up to date.   Chronic nose bleed-refer to ENT for further evaluation.  Diabetes-well-controlled. Hemoglobin A1c of 6.2. It is up just slightly from previous. Discussed trying to maybe to expose the Victoza to 2.4 mg to see if the insurance will cover. New prescription sent to pharmacy today. Otherwise follow-up in 3-4 months. Will call for up-to-date eye exam for my eye doctor here in [place]. New prescription sent for glucometer and test strips.  Pannus  -He would like to have another referral back to plastic surgeon for panniculectomy. We previously stented to [facility] plastic surgery and will resend it there.  He would also like to be referred back to the bariatric clinic for consideration of possible repeat bariatric surgery.  Lee vaccine given today.

## 2016-05-02 ENCOUNTER — Other Ambulatory Visit: Payer: Self-pay | Admitting: Family Medicine

## 2016-05-06 DIAGNOSIS — F4323 Adjustment disorder with mixed anxiety and depressed mood: Secondary | ICD-10-CM | POA: Diagnosis not present

## 2016-05-08 ENCOUNTER — Other Ambulatory Visit: Payer: Self-pay

## 2016-05-08 ENCOUNTER — Telehealth: Payer: Self-pay | Admitting: *Deleted

## 2016-05-08 DIAGNOSIS — E119 Type 2 diabetes mellitus without complications: Secondary | ICD-10-CM

## 2016-05-08 MED ORDER — LIRAGLUTIDE 18 MG/3ML ~~LOC~~ SOPN: 1.8000 mg | PEN_INJECTOR | Freq: Every day | SUBCUTANEOUS | 5 refills | Status: DC

## 2016-05-08 NOTE — Telephone Encounter (Signed)
PA submitted [id number]

## 2016-05-10 DIAGNOSIS — Z0000 Encounter for general adult medical examination without abnormal findings: Secondary | ICD-10-CM | POA: Diagnosis not present

## 2016-05-11 LAB — COMPLETE METABOLIC PANEL WITH GFR
ALT: 26 U/L (ref 9–46)
AST: 23 U/L (ref 10–35)
Albumin: 4.2 g/dL (ref 3.6–5.1)
Alkaline Phosphatase: 86 U/L (ref 40–115)
BUN: 22 mg/dL (ref 7–25)
CO2: 28 mmol/L (ref 20–31)
Calcium: 9.2 mg/dL (ref 8.6–10.3)
Chloride: 107 mmol/L (ref 98–110)
Creat: 1.65 mg/dL — ABNORMAL HIGH (ref 0.70–1.33)
GFR, Est African American: 52 mL/min — ABNORMAL LOW (ref >=60)
GFR, Est Non African American: 45 mL/min — ABNORMAL LOW (ref >=60)
Glucose, Bld: 116 mg/dL — ABNORMAL HIGH (ref 65–99)
Potassium: 4.5 mmol/L (ref 3.5–5.3)
Sodium: 140 mmol/L (ref 135–146)
Total Bilirubin: 0.5 mg/dL (ref 0.2–1.2)
Total Protein: 7.1 g/dL (ref 6.1–8.1)

## 2016-05-11 LAB — TESTOSTERONE: Testosterone: 552 ng/dL (ref 250–827)

## 2016-05-13 ENCOUNTER — Other Ambulatory Visit: Payer: Self-pay | Admitting: Family Medicine

## 2016-05-13 DIAGNOSIS — Z903 Acquired absence of stomach [part of]: Secondary | ICD-10-CM

## 2016-05-13 DIAGNOSIS — I4891 Unspecified atrial fibrillation: Secondary | ICD-10-CM | POA: Diagnosis not present

## 2016-05-13 DIAGNOSIS — F4323 Adjustment disorder with mixed anxiety and depressed mood: Secondary | ICD-10-CM | POA: Diagnosis not present

## 2016-05-13 DIAGNOSIS — Z6841 Body Mass Index (BMI) 40.0 and over, adult: Secondary | ICD-10-CM | POA: Diagnosis not present

## 2016-05-13 DIAGNOSIS — Z9889 Other specified postprocedural states: Secondary | ICD-10-CM | POA: Diagnosis not present

## 2016-05-13 DIAGNOSIS — K912 Postsurgical malabsorption, not elsewhere classified: Secondary | ICD-10-CM | POA: Insufficient documentation

## 2016-05-13 DIAGNOSIS — Z9989 Dependence on other enabling machines and devices: Secondary | ICD-10-CM | POA: Diagnosis not present

## 2016-05-13 DIAGNOSIS — I10 Essential (primary) hypertension: Secondary | ICD-10-CM | POA: Diagnosis not present

## 2016-05-13 DIAGNOSIS — R7989 Other specified abnormal findings of blood chemistry: Secondary | ICD-10-CM

## 2016-05-13 DIAGNOSIS — I4892 Unspecified atrial flutter: Secondary | ICD-10-CM | POA: Diagnosis not present

## 2016-05-13 NOTE — Telephone Encounter (Signed)
Message from insurance is that no PA is required for victoza. Confirmed with angela that request is 3 for 30 day supply

## 2016-05-16 ENCOUNTER — Other Ambulatory Visit: Payer: Self-pay

## 2016-05-16 NOTE — Progress Notes (Signed)
HPI: FU atrial fibrillation. Previously presented to [facility] with atrial fibrillation. Based on outside records LV function was normal. Stress echocardiogram in March of 2012 showed no ischemia. Echocardiogram in March of 2014 showed an ejection fraction of 40-45%, mild left atrial enlargement and grade 2 diastolic dysfunction. Study was technically difficult. Nuclear study in April of 2014 at [facility] showed an ejection fraction greater than 65% and normal perfusion. Patient had an outpatient monitor for recurrent palpitations which revealed wide-complex tachycardia. The patient was seen by Dr. Rayann Heman and this was felt to be atrial flutter with one-to-one conduction. He was started on flecainide. Followup exercise treadmill showed no induced ventricular arrhythmias but there was a rate related left bundle branch block. Flecanide changed to amiodarone previously at [facility]. Patient felt not to be a candidate for ablation due to size. Hospitalized at [facility] 8/15 with GI bleed and anticoagulation DCed. Patient did have a colonoscopy which revealed pandiverticulosis. He also was transfused 8 units of packed red blood cells. Discharge summary states anticoagulation can be resumed in one week if hemoglobin stable. Patient decided to continue aspirin and avoid Coumadin. Since I last saw him, for the last several weeks patient has had occasional chest pain. It is to the right sternal area described as a sharp pressure sensation. Occurs with activities relieved with rest. Also occurs with stress. Last 5 minutes and resolves. No radiation or associated symptoms. He has some dyspnea on exertion but no orthopnea, PND or pedal edema. He has occasional palpitations.  Current Outpatient Prescriptions  Medication Sig Dispense Refill  . allopurinol (ZYLOPRIM) 100 MG tablet Take 1 tablet (100 mg total) by mouth daily. 30 tablet 0  . AMBULATORY NON FORMULARY MEDICATION Medication Name: CPAP machine  with nasal mask and supplies set to 17.5 cm water pressure.  No humidifier.  Sent to [facility].  See sleep study from 2009 attached.  Dx: OSA. 1 Units PRN.  Marland Kitchen AMBULATORY NON FORMULARY MEDICATION Knee high graduated compression stockings medium. I83.93 1 each 0  . AMBULATORY NON FORMULARY MEDICATION Medication Name: Cyclobenzaprine 2% gabapentin 3% Baclofen 2%  Diclofenac 3% Qty# 120 ml Sig: apply small amount topically to effected area 2-3 times a day as directed [phone] (Fax) 120 mL 2  . amiodarone (PACERONE) 200 MG tablet Take 1 tablet (200 mg total) by mouth daily. 90 tablet 3  . ANDROGEL PUMP 20.25 MG/ACT (1.62%) GEL place 4 pumps on the skin every morning 150 g 4  . blood glucose meter kit and supplies KIT Dispense based on patient and insurance preference. Use up to four times daily as directed. (FOR ICD-9 250.00, 250.01). 1 each 0  . buPROPion (WELLBUTRIN SR) 150 MG 12 hr tablet Take 2 tablets (300 mg total) by mouth daily. 60 tablet 1  . diltiazem (CARDIZEM CD) 240 MG 24 hr capsule Take 1 capsule (240 mg total) by mouth daily. 90 capsule 3  . escitalopram (LEXAPRO) 20 MG tablet Take '20mg'$  tablets twice a day. 60 tablet 1  . HYDROcodone-acetaminophen (NORCO) 10-325 MG per tablet Take 1 tablet by mouth every 8 (eight) hours as needed. 90 tablet 0  . liraglutide (VICTOZA) 18 MG/3ML SOPN Inject 0.3 mLs (1.8 mg total) into the skin daily. 3 pen 5  . lisinopril (PRINIVIL,ZESTRIL) 5 MG tablet Take 1 tablet (5 mg total) by mouth daily. 90 tablet 3  . LORazepam (ATIVAN) 1 MG tablet TAKE 1 TABLET BY MOUTH EVERY 8 HOURS only as needed 90 tablet 1  .  metFORMIN (GLUCOPHAGE) 500 MG tablet Take 1 tablet (500 mg total) by mouth 2 (two) times daily with a meal. 180 tablet 0  . oxyCODONE (ROXICODONE) 15 MG immediate release tablet   0  . pravastatin (PRAVACHOL) 40 MG tablet Take 1 tablet (40 mg total) by mouth daily. 90 tablet 3  . tiZANidine (ZANAFLEX) 4 MG tablet Take 4 mg by mouth once.      No  current facility-administered medications for this visit.      Past Medical History:  Diagnosis Date  . Anemia   . Diabetes mellitus    controlled  . ED (erectile dysfunction)   . Gout   . Kidney stones    stent, lithotripsy  . Lymphadenopathy   . Morbid obesity (HCC)   . OSA (obstructive sleep apnea)    CPAP-17.5 cm water pressure  . Paroxysmal atrial fibrillation (HCC)   . Pneumonia   . S/P emergency tracheotomy for assistance in breathing [facility])    at age 17  . Testosterone deficiency   . Vitamin D deficiency     Past Surgical History:  Procedure Laterality Date  . CHOLECYSTECTOMY    . CYSTOSCOPY     retrograde and double J catheter insertion.  Marland Kitchen FINGER SURGERY     ulnar digital nerve and artery right index finger  . GASTRIC BYPASS  2005   600 lbs prior to surgery  . TONSILLECTOMY      Social History   Social History  . Marital status: Married    Spouse name: N/A  . Number of children: N/A  . Years of education: N/A   Occupational History  . Not on file.   Social History Main Topics  . Smoking status: Never Smoker  . Smokeless tobacco: Never Used  . Alcohol use No  . Drug use: No     Comment: prior cocaine abuse (heavy) in his 69s.  denies subsequent use  . Sexual activity: Yes    Partners: Female    Birth control/ protection: None     Comment: on full disability, separated, no regular exercise,walks some, drinks pot of coffee a day.   Other Topics Concern  . Not on file   Social History Narrative   Drinks one pot of coffee daily.   Regular exercise-no, walks some      Lives in [place] with roommate.   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    Family History  Problem Relation Age of Onset  . Cancer Mother     lung, heavy smoker  . Hypertension Mother   . Hyperlipidemia Mother   . Cancer Father     melanoma  . Aneurysm Father     cardiac  . Hyperlipidemia Father   . Hypertension Father   . Hyperlipidemia  Sister   . Hypertension Sister   . Hyperlipidemia Brother   . Hypertension Brother   . Stroke Other     ROS: no fevers or chills, productive cough, hemoptysis, dysphasia, odynophagia, melena, hematochezia, dysuria, hematuria, rash, seizure activity, orthopnea, PND, pedal edema, claudication. Remaining systems are negative.  Physical Exam: Well-developed morbidly obese in no acute distress.  Skin is warm and dry.  HEENT is normal.  Neck is supple.  Chest is clear to auscultation with normal expansion.  Cardiovascular exam is regular rate and rhythm.  Abdominal exam nontender or distended. No masses palpated. Extremities show no edema. neuro grossly intact  ECG sinus rhythm at a rate of 71.  Left anterior fascicular block. Cannot rule out anterior infarct.  A/P  1 atrial fibrillation-patient remains in sinus rhythm. Continue amiodarone. Check TSH and chest x-ray. Recent LFTs normal. We again discussed anticoagulation. Given his history of life-threatening bleed he would continue to like to avoid. He understands the high risk of CVA. Add ASA 81 mg daily.  2 hypertension-blood pressure elevated but he states typically controlled. Continue present medications and follow.  3 morbid obesity-patient counseled on weight loss.  4 chest pain-symptoms with both typical and atypical features. Schedule Lexiscan nuclear study for risk stratification.  Kirk Ruths, MD

## 2016-05-20 DIAGNOSIS — F4323 Adjustment disorder with mixed anxiety and depressed mood: Secondary | ICD-10-CM | POA: Diagnosis not present

## 2016-05-21 ENCOUNTER — Other Ambulatory Visit: Payer: Self-pay

## 2016-05-22 ENCOUNTER — Ambulatory Visit (INDEPENDENT_AMBULATORY_CARE_PROVIDER_SITE_OTHER): Payer: BLUE CROSS/BLUE SHIELD | Admitting: Cardiology

## 2016-05-22 ENCOUNTER — Telehealth: Payer: Self-pay | Admitting: Family Medicine

## 2016-05-22 ENCOUNTER — Encounter: Payer: Self-pay | Admitting: Cardiology

## 2016-05-22 VITALS — BP 141/98 | HR 71 | Ht 69.0 in | Wt 354.0 lb

## 2016-05-22 DIAGNOSIS — I4891 Unspecified atrial fibrillation: Secondary | ICD-10-CM

## 2016-05-22 MED ORDER — ASPIRIN EC 81 MG PO TBEC
81.0000 mg | DELAYED_RELEASE_TABLET | Freq: Every day | ORAL | 3 refills | Status: AC
Start: 2016-05-22 — End: ?

## 2016-05-22 NOTE — Telephone Encounter (Signed)
Pt states he was advised by the pharmacy the Rx for a diabetic testing meter must be for a specific brand.

## 2016-05-22 NOTE — Telephone Encounter (Signed)
Then he will have to call his insurance and find out what brand is covered and we can update the Rx.

## 2016-05-22 NOTE — Patient Instructions (Signed)
Medication Instructions:   NO CHANGE  Labwork:  Your physician recommends that you  HAVE LAB WORK TODAY  Testing/Procedures:  Your physician has requested that you have a lexiscan myoview. For further information please visit [email or web address]. Please follow instruction sheet, as given.  A chest x-ray takes a picture of the organs and structures inside the chest, including the heart, lungs, and blood vessels. This test can show several things, including, whether the heart is enlarges; whether fluid is building up in the lungs; and whether pacemaker / defibrillator leads are still in place.     Follow-Up:  Your physician wants you to follow-up in: 6 MONTHS WITH DR Jens SomRENSHAW You will receive a reminder letter in the mail two months in advance. If you don't receive a letter, please call our office to schedule the follow-up appointment.

## 2016-05-23 ENCOUNTER — Telehealth: Payer: Self-pay | Admitting: *Deleted

## 2016-05-23 NOTE — Telephone Encounter (Signed)
Have called twice to have insurance fax appropriate form to initiate PA for compound medication. Unable to complete through covermymeds due to it being a compound.

## 2016-05-23 NOTE — Telephone Encounter (Signed)
Left VM for Pt to contact insurance then let us know. Callback information provided.

## 2016-05-28 ENCOUNTER — Ambulatory Visit (INDEPENDENT_AMBULATORY_CARE_PROVIDER_SITE_OTHER): Payer: BLUE CROSS/BLUE SHIELD

## 2016-05-28 DIAGNOSIS — N1330 Unspecified hydronephrosis: Secondary | ICD-10-CM | POA: Diagnosis not present

## 2016-05-28 DIAGNOSIS — I4891 Unspecified atrial fibrillation: Secondary | ICD-10-CM

## 2016-05-28 DIAGNOSIS — R7989 Other specified abnormal findings of blood chemistry: Secondary | ICD-10-CM

## 2016-05-28 IMAGING — US US RENAL
1 series · 14 of 25 positions shown · non-contrast
Comparison: No recent studies in PACs

CLINICAL DATA: Elevated creatinine. History of diabetes,
hypertension, and urinary tract stones.

EXAM:
RENAL / URINARY TRACT ULTRASOUND COMPLETE

[Series 1: us renal · 0.28mm/px · 14 of 33 slices shown]
[im 1/33]
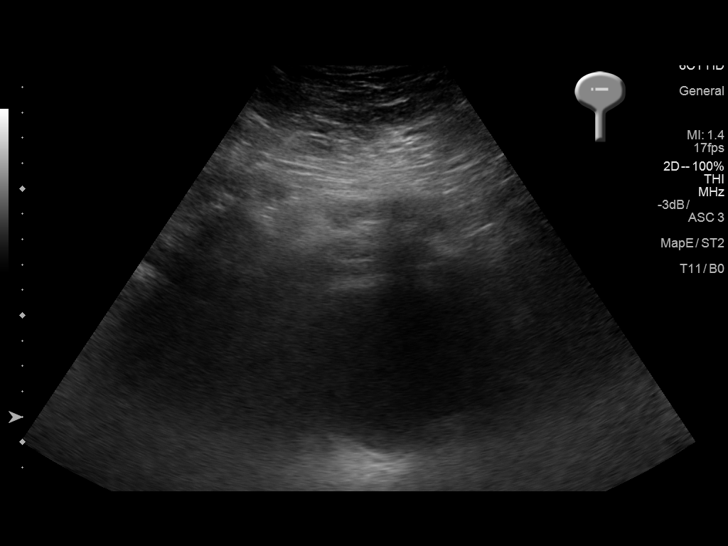
[im 3/33]
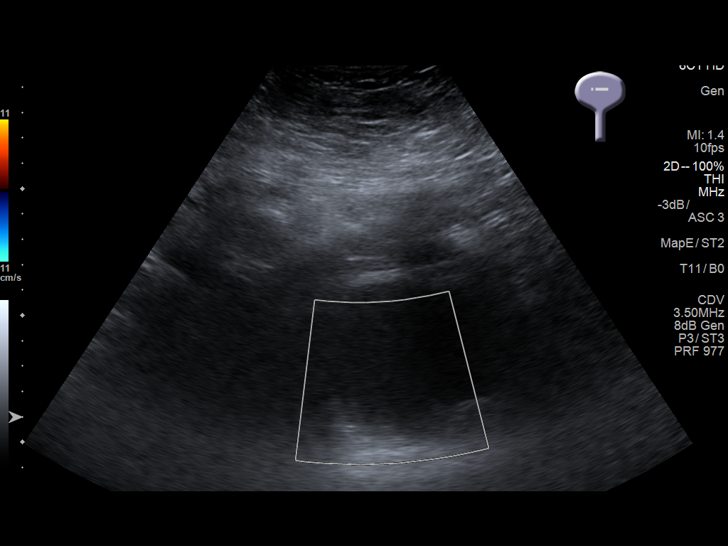
[im 6/33]
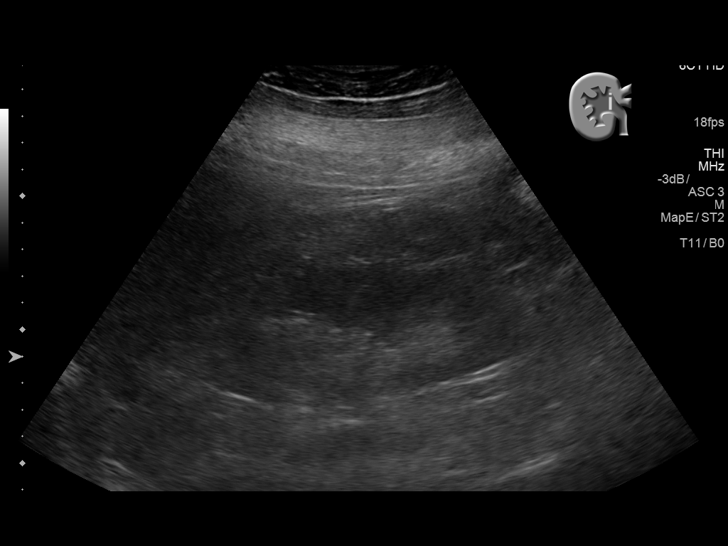
[im 9/33]
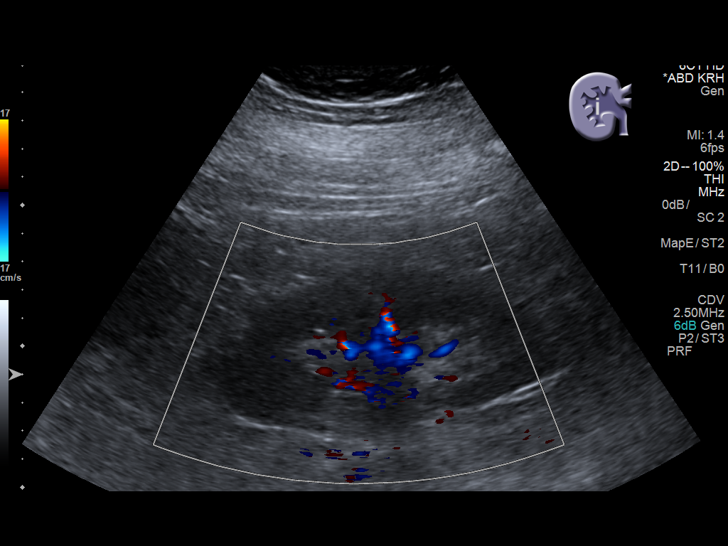
[im 11/33]
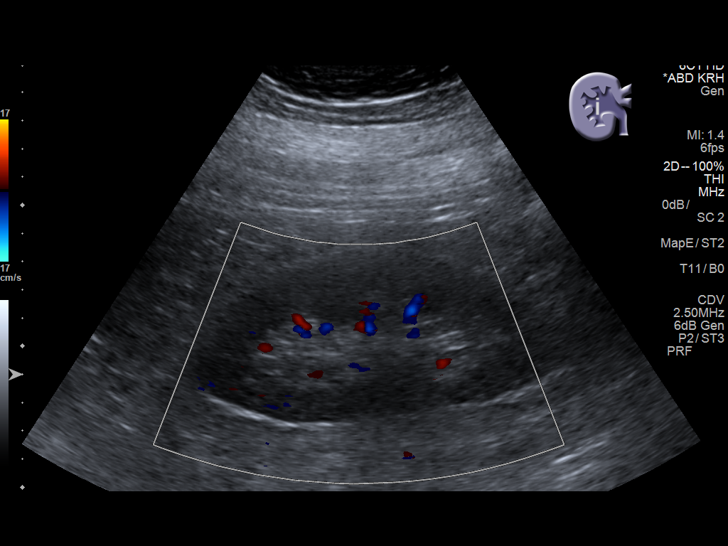
[im 13/33]
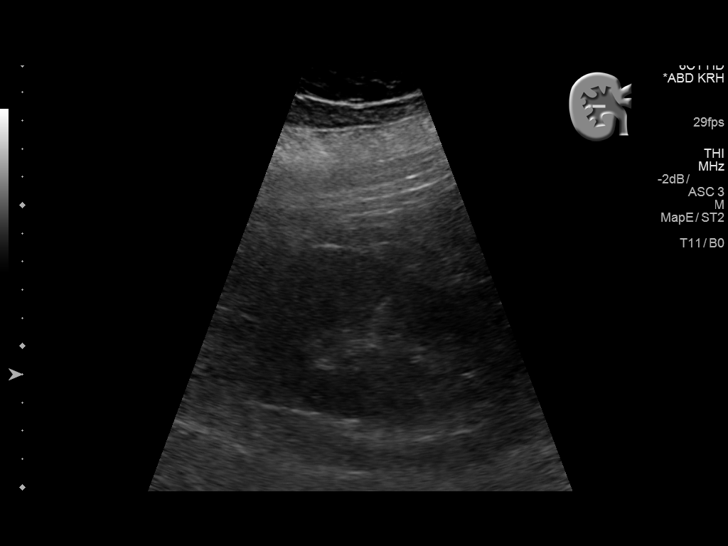
[im 15/33]
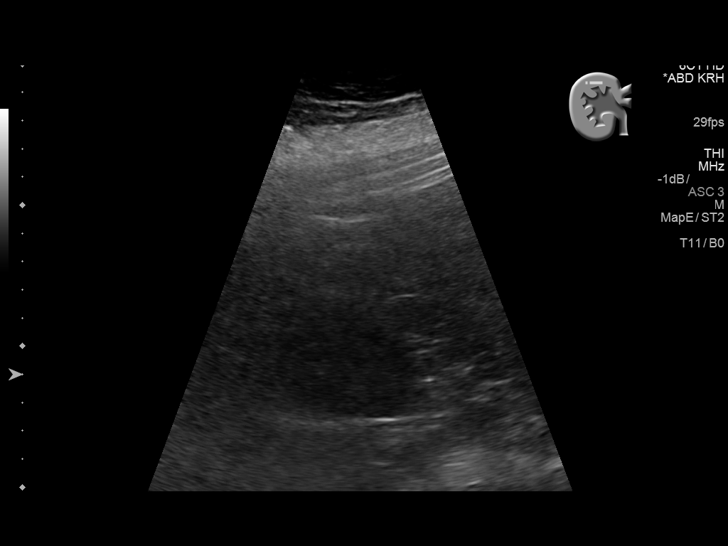
[im 18/33]
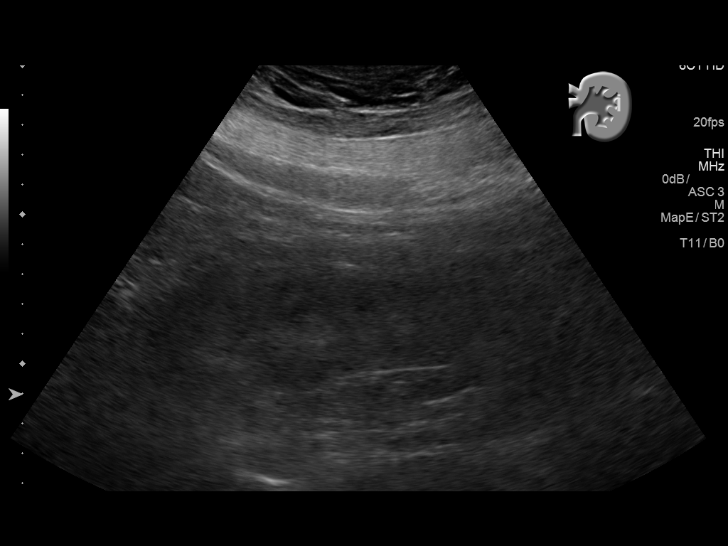
[im 21/33]
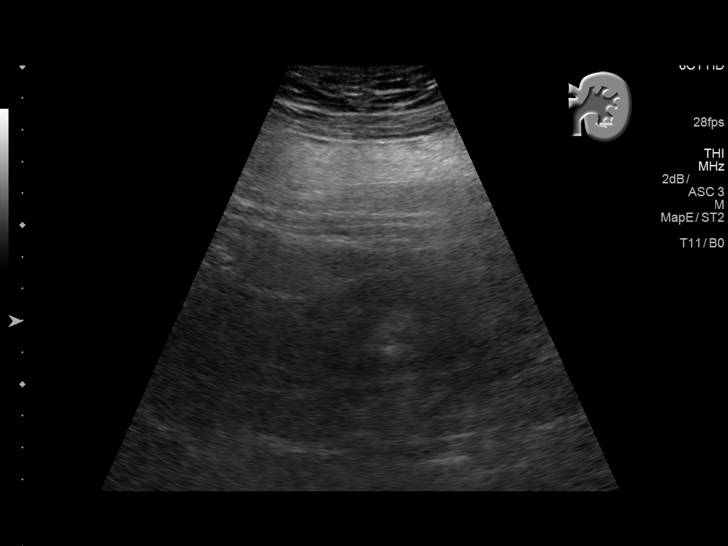
[im 22/33]
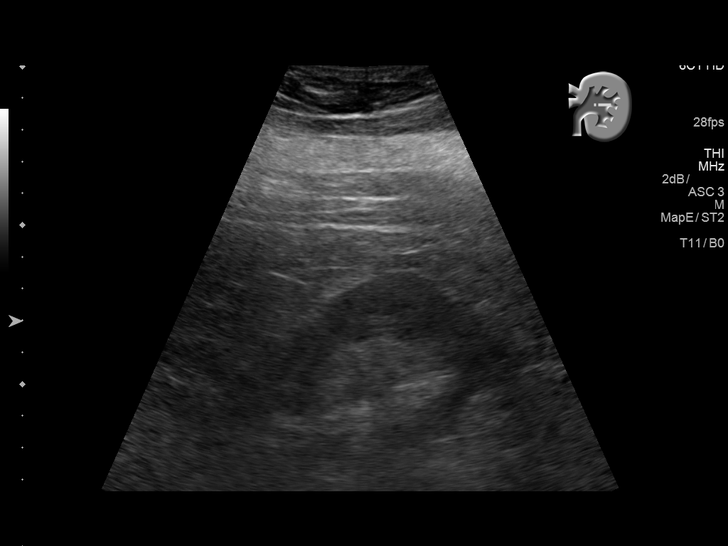
[im 25/33]
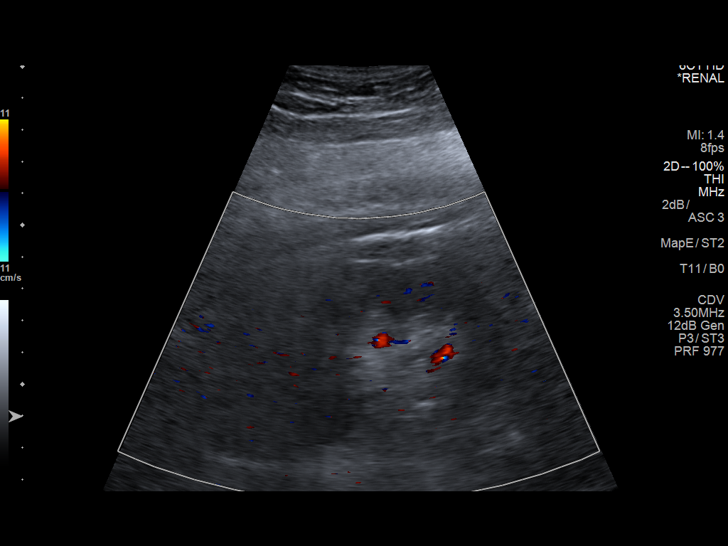
[im 27/33]
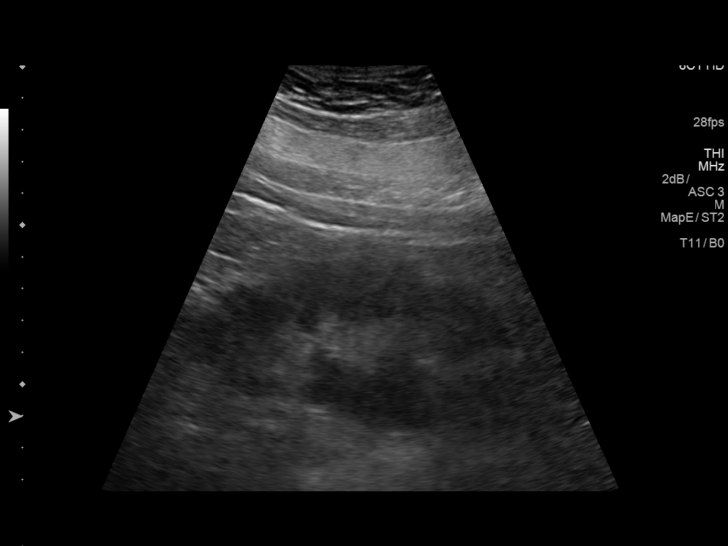
[im 30/33]
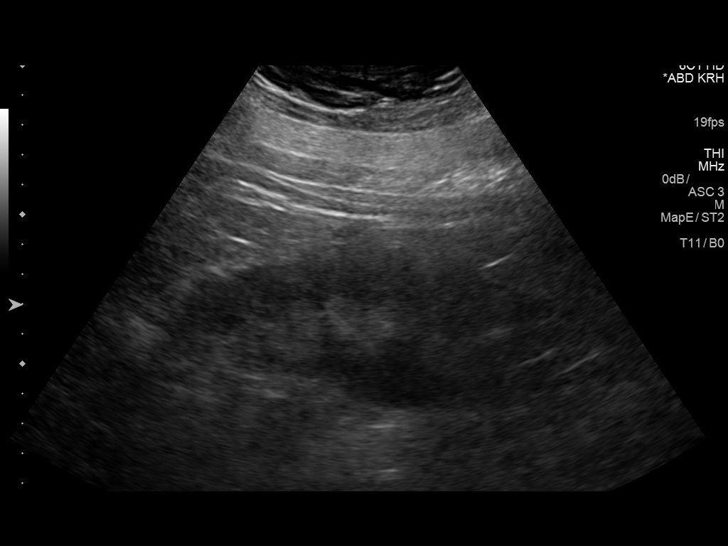
[im 33/33]
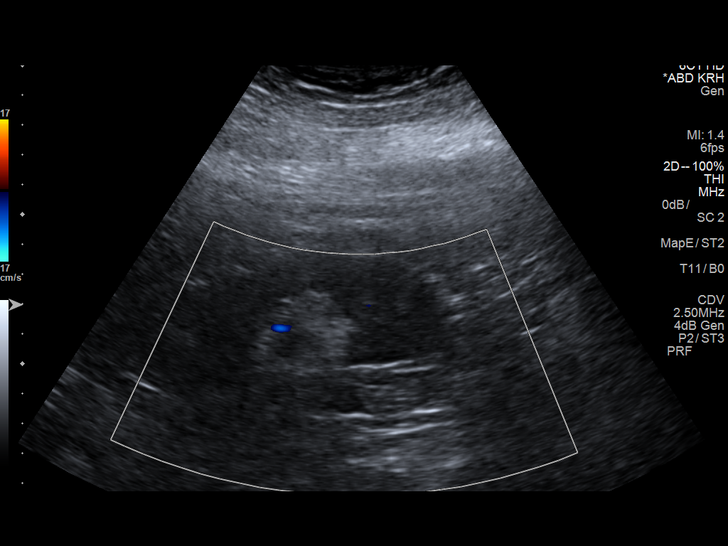

[14 of 25 positions shown; findings below may reference images not displayed]

FINDINGS: The study is limited due to the patient's body habitus.

Right Kidney:

Length: 12.3 cm. The renal cortical echotexture remains lower than
that of the adjacent liver. There is no hydronephrosis.

12.1 cm

Left Kidney:

Length: The renal cortical echotexture is similar to that on the
right. There is mild hydronephrosis which remains on postvoid
images. No definite stones are observed.. The urinary bladder
appears normal where visualized.

Bladder:

Appears normal for degree of bladder distention.
IMPRESSION: Mild left-sided hydronephrosis of uncertain etiology. Otherwise the
kidneys are unremarkable. Limited visualization of the urinary
bladder but no gross abnormality is observed.

## 2016-05-29 ENCOUNTER — Other Ambulatory Visit: Payer: Self-pay | Admitting: Family Medicine

## 2016-05-29 DIAGNOSIS — Z87442 Personal history of urinary calculi: Secondary | ICD-10-CM

## 2016-05-30 ENCOUNTER — Telehealth: Payer: Self-pay | Admitting: *Deleted

## 2016-05-30 DIAGNOSIS — R072 Precordial pain: Secondary | ICD-10-CM

## 2016-05-30 NOTE — Telephone Encounter (Signed)
Left message for pt to call, he needs a lexiscan myoview. The weight limit on the table here is 375 lbs. There is a question about the patients weight at last office visit compared to what he weighted at his PCP. Will need to discuss with the patient, if weight above 375 lbs he will need to have stress test at the hospital.

## 2016-06-03 DIAGNOSIS — F3289 Other specified depressive episodes: Secondary | ICD-10-CM | POA: Diagnosis not present

## 2016-06-03 DIAGNOSIS — Z9884 Bariatric surgery status: Secondary | ICD-10-CM | POA: Diagnosis not present

## 2016-06-03 DIAGNOSIS — F4323 Adjustment disorder with mixed anxiety and depressed mood: Secondary | ICD-10-CM | POA: Diagnosis not present

## 2016-06-03 DIAGNOSIS — Z7189 Other specified counseling: Secondary | ICD-10-CM | POA: Diagnosis not present

## 2016-06-07 ENCOUNTER — Encounter (HOSPITAL_COMMUNITY): Payer: Self-pay | Admitting: Psychiatry

## 2016-06-07 ENCOUNTER — Ambulatory Visit (INDEPENDENT_AMBULATORY_CARE_PROVIDER_SITE_OTHER): Payer: BLUE CROSS/BLUE SHIELD | Admitting: Psychiatry

## 2016-06-07 ENCOUNTER — Other Ambulatory Visit: Payer: Self-pay | Admitting: Family Medicine

## 2016-06-07 VITALS — BP 116/68 | HR 68 | Resp 16 | Ht 69.0 in | Wt 376.0 lb

## 2016-06-07 DIAGNOSIS — Z7982 Long term (current) use of aspirin: Secondary | ICD-10-CM | POA: Diagnosis not present

## 2016-06-07 DIAGNOSIS — F5102 Adjustment insomnia: Secondary | ICD-10-CM

## 2016-06-07 DIAGNOSIS — F411 Generalized anxiety disorder: Secondary | ICD-10-CM

## 2016-06-07 DIAGNOSIS — Z79899 Other long term (current) drug therapy: Secondary | ICD-10-CM | POA: Diagnosis not present

## 2016-06-07 DIAGNOSIS — F331 Major depressive disorder, recurrent, moderate: Secondary | ICD-10-CM

## 2016-06-07 DIAGNOSIS — Z79891 Long term (current) use of opiate analgesic: Secondary | ICD-10-CM

## 2016-06-07 DIAGNOSIS — F1921 Other psychoactive substance dependence, in remission: Secondary | ICD-10-CM

## 2016-06-07 MED ORDER — ESCITALOPRAM OXALATE 20 MG PO TABS: ORAL_TABLET | ORAL | 1 refills | Status: DC

## 2016-06-07 MED ORDER — LORAZEPAM 1 MG PO TABS: ORAL_TABLET | ORAL | 1 refills | Status: DC

## 2016-06-07 MED ORDER — BUPROPION HCL ER (SR) 150 MG PO TB12: 300.0000 mg | ORAL_TABLET | Freq: Every day | ORAL | 1 refills | Status: DC

## 2016-06-07 NOTE — Progress Notes (Signed)
Patient ID: Rodney Bright, male   DOB: Sep 29, 1960, 56 y.o.   MRN: [id number]   [facility] Follow-up Outpatient Visit  Rodney Bright 1960/10/19  Date: 02/20/2016  HPI Comments: Rodney Bright is a 56 y/o male with a past psychiatric history significant for symptoms of depression and anxiety. The patient was referred for psychiatric services for medication management.    Last visit we increased Wellbutrin to 300 mg for depression that helps some. He still feels the relationship has had an effect on the depression he is working in therapy. He started to distract himself he feels medication may need to be adjusted more but then he realized medication can only do some. He is also on pain medication for back and hip pain we talked about pain medication and its effect on depression.  Insomnia is relevant to stress he takes Ativan at night and also during the day for anxiety  Depression severity is 6.5 out of 10  Motivation is still poor but somewhat improved since last visit     Review of Systems  Constitutional: Negative for fever.  Cardiovascular: Negative for chest pain.  Gastrointestinal: Negative for nausea.  Skin: Negative for rash.  Psychiatric/Behavioral: Positive for depression. Negative for suicidal ideas.     Vitals:   06/07/16 1015  BP: 116/68  BP Location: Right Arm  Patient Position: Sitting  Cuff Size: Normal  Pulse: 68  Resp: 16  SpO2: 96%  Weight: (!) 376 lb (170.6 kg)  Height: _0  (1.753 m)    Physical Exam  Constitutional: No distress.  Obese  Skin: He is not diaphoretic.    Past Medical History: Reviewed  Past Medical History:  Diagnosis Date  . Anemia   . Diabetes mellitus    controlled  . ED (erectile dysfunction)   . Gout   . Kidney stones    stent, lithotripsy  . Lymphadenopathy   . Morbid obesity ([place])   . OSA (obstructive sleep apnea)    CPAP-17.5 cm water pressure  . Paroxysmal atrial fibrillation (HCC)   . Pneumonia    . S/P emergency tracheotomy for assistance in breathing [facility])    at age 79  . Testosterone deficiency   . Vitamin D deficiency      Allergies:  Allergies  Allergen Reactions  . Colchicine Shortness Of Breath, Swelling and Itching  . Peach Flavor     "Peaches" " swelling of face"     Current Medications: Current Outpatient Prescriptions on File Prior to Visit  Medication Sig Dispense Refill  . allopurinol (ZYLOPRIM) 100 MG tablet Take 1 tablet (100 mg total) by mouth daily. 30 tablet 0  . AMBULATORY NON FORMULARY MEDICATION Medication Name: CPAP machine with nasal mask and supplies set to 17.5 cm water pressure.  No humidifier.  Sent to [facility].  See sleep study from 2009 attached.  Dx: OSA. 1 Units PRN.  Marland Kitchen AMBULATORY NON FORMULARY MEDICATION Knee high graduated compression stockings medium. I83.93 1 each 0  . AMBULATORY NON FORMULARY MEDICATION Medication Name: Cyclobenzaprine 2% gabapentin 3% Baclofen 2%  Diclofenac 3% Qty# 120 ml Sig: apply small amount topically to effected area 2-3 times a day as directed [phone] (Fax) 120 mL 2  . amiodarone (PACERONE) 200 MG tablet Take 1 tablet (200 mg total) by mouth daily. 90 tablet 3  . ANDROGEL PUMP 20.25 MG/ACT (1.62%) GEL place 4 pumps on the skin every morning 150 g 4  . aspirin EC 81 MG tablet Take 1 tablet (  81 mg total) by mouth daily. 90 tablet 3  . blood glucose meter kit and supplies KIT Dispense based on patient and insurance preference. Use up to four times daily as directed. (FOR ICD-9 250.00, 250.01). 1 each 0  . diltiazem (CARDIZEM CD) 240 MG 24 hr capsule Take 1 capsule (240 mg total) by mouth daily. 90 capsule 3  . HYDROcodone-acetaminophen (NORCO) 10-325 MG per tablet Take 1 tablet by mouth every 8 (eight) hours as needed. 90 tablet 0  . liraglutide (VICTOZA) 18 MG/3ML SOPN Inject 0.3 mLs (1.8 mg total) into the skin daily. 3 pen 5  . lisinopril (PRINIVIL,ZESTRIL) 5 MG tablet Take 1 tablet (5 mg total) by mouth  daily. 90 tablet 3  . metFORMIN (GLUCOPHAGE) 500 MG tablet Take 1 tablet (500 mg total) by mouth 2 (two) times daily with a meal. 180 tablet 0  . oxyCODONE (ROXICODONE) 15 MG immediate release tablet   0  . pravastatin (PRAVACHOL) 40 MG tablet Take 1 tablet (40 mg total) by mouth daily. 90 tablet 3  . tiZANidine (ZANAFLEX) 4 MG tablet Take 4 mg by mouth once.      No current facility-administered medications on file prior to visit.         Social History   Social History  . Marital status: Married    Spouse name: N/A  . Number of children: N/A  . Years of education: N/A   Social History Main Topics  . Smoking status: Never Smoker  . Smokeless tobacco: Never Used  . Alcohol use No  . Drug use: No     Comment: prior cocaine abuse (heavy) in his 2s.  denies subsequent use  . Sexual activity: Yes    Partners: Female    Birth control/ protection: None     Comment: on full disability, separated, no regular exercise,walks some, drinks pot of coffee a day.   Other Topics Concern  . None   Social History Narrative   Drinks one pot of coffee daily.   Regular exercise-no, walks some      Lives in [place] with roommate.   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    Caffeine: Caffeinated Beverages 1 5 hour energy   Family History: Reviewed  Family History  Problem Relation Age of Onset  . Cancer Mother     lung, heavy smoker  . Hypertension Mother   . Hyperlipidemia Mother   . Cancer Father     melanoma  . Aneurysm Father     cardiac  . Hyperlipidemia Father   . Hypertension Father   . Hyperlipidemia Sister   . Hypertension Sister   . Hyperlipidemia Brother   . Hypertension Brother   . Stroke Other     Psychiatric Specialty Examination: Objective: Appearance: Casual   Eye Contact:: Good   Speech: Clear and Coherent and Normal Rate   Volume: Normal   Mood: still dysphoric at times.   Affect: constricted  Thought Process:  Coherent, Linear and Logical   Orientation: Full   Thought Content: WDL   Suicidal Thoughts: No   Homicidal Thoughts: No   Judgement: Fair   Insight: Good   Psychomotor Activity: Normal   Akathisia: Yes   Handed: Right   Memory-immediate 3/3; recent-2/3  Fund of knowledge-average to above average  Language-Intact  AIMS (if indicated): None   Assets: Communication Skills  Desire for Improvement  Financial Resources/Insurance  Physical Health    Laboratory/X-Ray  Psychological Evaluation(s)  none  Not available    Assessment:  AXIS I  Major Depression, Recurrent severe-improving , 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 GAD. insomnia  AXIS II  No diagnosis   AXIS III  Past Medical History    Diagnosis  Date    .  Atrial fibrillation     .  Lymphadenopathy     .  Testosterone deficiency     .  Anemia     .  Vitamin d deficiency     .  ED (erectile dysfunction)     .  Morbid obesity     .  OSA (obstructive sleep apnea)       CPAP-17.5 cm water pressure    .  Kidney stones       stent, lithotripsy    .  Lymphadenopathy     .  Diabetes mellitus       controlled    .  Testosterone deficiency     .  Excess or deficiency of vitamin D     .  ED (erectile dysfunction)       AXIS IV  economic problems, educational problems, occupational problems and problems with primary support group   AXIS V  60-moderate symptoms    Treatment Plan/Recommendations:  PLAN:   1. Depression: slight improvement. Will continue wellbutrin 333m and lexapro 424m Reluctant to increase wellbutrin as he has anxiety as well  2. Anxiety: de[ends on stress level. Ativan helps. Cautioned not to increase it  3. Insomnia: baseline I explained pain medication can also lead to depression in the long run he takes it because without it he has pain in her back and hip. Continue therapy will work on seeing him back in 2 months or earlier if needed no other side effects reported Follow up in  10weeks or earlier if needed.    NAMerian CapronM.D.  06/07/2016 10:30 AM

## 2016-06-07 NOTE — Telephone Encounter (Signed)
Spoke with pt, lexiscan scheduled. Instructions mailed to the patient.

## 2016-06-10 DIAGNOSIS — F4323 Adjustment disorder with mixed anxiety and depressed mood: Secondary | ICD-10-CM | POA: Diagnosis not present

## 2016-06-11 DIAGNOSIS — Z6841 Body Mass Index (BMI) 40.0 and over, adult: Secondary | ICD-10-CM | POA: Diagnosis not present

## 2016-06-14 ENCOUNTER — Telehealth (HOSPITAL_COMMUNITY): Payer: Self-pay

## 2016-06-14 NOTE — Telephone Encounter (Signed)
Encounter complete. 

## 2016-06-17 DIAGNOSIS — F4323 Adjustment disorder with mixed anxiety and depressed mood: Secondary | ICD-10-CM | POA: Diagnosis not present

## 2016-06-18 ENCOUNTER — Telehealth (HOSPITAL_COMMUNITY): Payer: Self-pay | Admitting: Psychiatry

## 2016-06-18 NOTE — Telephone Encounter (Signed)
We have talked about it and inattention can be relavant to poor sleep, anxiety and depression as well. Which we continue to work on it. For new eval ADHD please refer or can consider  [facility] for detailed assessment examination .

## 2016-06-18 NOTE — Telephone Encounter (Signed)
Return call telephone to pt. Pt states he spoke with therapist. Therapist suggested pt to start on a low dose of Vyvanse and Adderall. Pt states he is having trouble focusing. Please advise.

## 2016-06-18 NOTE — Telephone Encounter (Signed)
Pt went and seen therapist yesterday. Pt was told to call our office and speak to the nurse about some things. Pt would not give me any further information.

## 2016-06-18 NOTE — Telephone Encounter (Signed)
Referral place to [facility] at [phone]. Called and informed pt. Nothing further is needed at this time.

## 2016-06-19 ENCOUNTER — Inpatient Hospital Stay (HOSPITAL_COMMUNITY): Admission: RE | Admit: 2016-06-19 | Payer: BLUE CROSS/BLUE SHIELD | Source: Ambulatory Visit

## 2016-06-24 DIAGNOSIS — F4323 Adjustment disorder with mixed anxiety and depressed mood: Secondary | ICD-10-CM | POA: Diagnosis not present

## 2016-06-26 ENCOUNTER — Ambulatory Visit (HOSPITAL_COMMUNITY): Payer: BLUE CROSS/BLUE SHIELD

## 2016-06-26 ENCOUNTER — Telehealth (HOSPITAL_COMMUNITY): Payer: Self-pay

## 2016-06-26 DIAGNOSIS — R7989 Other specified abnormal findings of blood chemistry: Secondary | ICD-10-CM | POA: Insufficient documentation

## 2016-06-26 DIAGNOSIS — Z9884 Bariatric surgery status: Secondary | ICD-10-CM | POA: Diagnosis not present

## 2016-06-26 DIAGNOSIS — R5383 Other fatigue: Secondary | ICD-10-CM | POA: Insufficient documentation

## 2016-06-26 DIAGNOSIS — Z6841 Body Mass Index (BMI) 40.0 and over, adult: Secondary | ICD-10-CM | POA: Diagnosis not present

## 2016-06-26 DIAGNOSIS — K912 Postsurgical malabsorption, not elsewhere classified: Secondary | ICD-10-CM | POA: Diagnosis not present

## 2016-06-26 DIAGNOSIS — E611 Iron deficiency: Secondary | ICD-10-CM | POA: Insufficient documentation

## 2016-06-26 DIAGNOSIS — Z9889 Other specified postprocedural states: Secondary | ICD-10-CM | POA: Diagnosis not present

## 2016-06-26 NOTE — Telephone Encounter (Signed)
Encounter complete. 

## 2016-06-27 ENCOUNTER — Ambulatory Visit (HOSPITAL_COMMUNITY)
Admission: RE | Admit: 2016-06-27 | Discharge: 2016-06-27 | Disposition: A | Discharge status: Home / Self Care | Payer: BLUE CROSS/BLUE SHIELD | Source: Ambulatory Visit | Attending: Cardiology | Admitting: Cardiology

## 2016-06-27 DIAGNOSIS — R072 Precordial pain: Secondary | ICD-10-CM | POA: Diagnosis not present

## 2016-06-27 MED ORDER — AMINOPHYLLINE 25 MG/ML IV SOLN
75.0000 mg | Freq: Once | INTRAVENOUS | Status: AC
  Administered 2016-06-27: 75 mg via INTRAVENOUS

## 2016-06-27 MED ORDER — REGADENOSON 0.4 MG/5ML IV SOLN
0.4000 mg | Freq: Once | INTRAVENOUS | Status: AC
  Administered 2016-06-27: 0.4 mg via INTRAVENOUS

## 2016-06-27 MED ORDER — TECHNETIUM TC 99M TETROFOSMIN IV KIT
31.1000 | PACK | Freq: Once | INTRAVENOUS | Status: AC | PRN
  Administered 2016-06-27: 31.1 via INTRAVENOUS
  Filled 2016-06-27: qty 32

## 2016-06-28 ENCOUNTER — Ambulatory Visit (HOSPITAL_COMMUNITY)
Admission: RE | Admit: 2016-06-28 | Discharge: 2016-06-28 | Disposition: A | Discharge status: Home / Self Care | Payer: BLUE CROSS/BLUE SHIELD | Source: Ambulatory Visit | Attending: Cardiology | Admitting: Cardiology

## 2016-06-28 LAB — MYOCARDIAL PERFUSION IMAGING
LV dias vol: 196 mL (ref 62–150)
LV sys vol: 130 mL
Peak HR: 83 {beats}/min
Rest HR: 73 {beats}/min
SDS: 6
SRS: 17
SSS: 21
TID: 1.18

## 2016-06-28 MED ORDER — TECHNETIUM TC 99M TETROFOSMIN IV KIT: 27.9000 | PACK | Freq: Once | INTRAVENOUS | Status: DC | PRN

## 2016-07-01 ENCOUNTER — Telehealth (HOSPITAL_COMMUNITY): Payer: Self-pay | Admitting: *Deleted

## 2016-07-01 DIAGNOSIS — F4323 Adjustment disorder with mixed anxiety and depressed mood: Secondary | ICD-10-CM | POA: Diagnosis not present

## 2016-07-01 NOTE — Telephone Encounter (Signed)
Pt was seen by therapist, Karmen BongoAaron Stewart today. Per pt, therapist states he may need to be placed on a stimulant to assist with his concentration.    Informed pt, please have therapist to send notes or records from testing. Pt will need to schedule an earlier apt. Pt's follow up apt is schedule on 07/04/16.

## 2016-07-03 NOTE — Telephone Encounter (Signed)
Will assess at visit 

## 2016-07-04 ENCOUNTER — Encounter: Payer: Self-pay | Admitting: Cardiology

## 2016-07-04 ENCOUNTER — Ambulatory Visit (INDEPENDENT_AMBULATORY_CARE_PROVIDER_SITE_OTHER): Payer: BLUE CROSS/BLUE SHIELD | Admitting: Psychiatry

## 2016-07-04 VITALS — BP 126/74 | HR 88 | Resp 16 | Ht 69.0 in | Wt 373.0 lb

## 2016-07-04 DIAGNOSIS — F331 Major depressive disorder, recurrent, moderate: Secondary | ICD-10-CM

## 2016-07-04 DIAGNOSIS — F411 Generalized anxiety disorder: Secondary | ICD-10-CM

## 2016-07-04 DIAGNOSIS — F1921 Other psychoactive substance dependence, in remission: Secondary | ICD-10-CM

## 2016-07-04 DIAGNOSIS — Z79899 Other long term (current) drug therapy: Secondary | ICD-10-CM

## 2016-07-04 DIAGNOSIS — Z888 Allergy status to other drugs, medicaments and biological substances status: Secondary | ICD-10-CM

## 2016-07-04 DIAGNOSIS — F5102 Adjustment insomnia: Secondary | ICD-10-CM | POA: Diagnosis not present

## 2016-07-04 DIAGNOSIS — F900 Attention-deficit hyperactivity disorder, predominantly inattentive type: Secondary | ICD-10-CM

## 2016-07-04 MED ORDER — ATOMOXETINE HCL 40 MG PO CAPS: 40.0000 mg | ORAL_CAPSULE | Freq: Every day | ORAL | 0 refills | Status: DC

## 2016-07-04 NOTE — Progress Notes (Signed)
Patient ID: Rodney Bright, male   DOB: 16-Aug-1960, 56 y.o.   MRN: [id number]   [facility] Follow-up Outpatient Visit  Rodney Bright [birth year]  Date: 07/04/2016  HPI Comments: Rodney Bright is a 56 y/o male with a past psychiatric history significant for symptoms of depression and anxiety. The patient was referred for psychiatric services for medication management.    Patient is coming in for an early visit also have got a note from psychologist Vivia Budge who recommends for him to get started on medication for ADHD.  Patient states that he is not able to focus function. He has had low energy before but now is on testosterone therapy that is affected and his libido and energy is improved. His focus remains  Poor and so  his procrastination and that he has had probably inattentive symptoms throughout his life. He has not been able to achieve the things he wanted in life. He feels that his depression may have made it worse but states he is on anti depressants. Still gets distracted easily. He is currently not working and looking for job.    He takes Ativan for anxiety and also pain medication for his back condition I cautioned that those medication can slow processing and down can affect  concentration as well, he remains persistent about being started on ADHD medication and also refers back to the therapist that he also has suggested that.  I cautioned that he has controlled A. fib and ADHD medication can affect the heart rate he still remained persistent about being started on  Depression was not hopeless or helpless he is trying to look for another job he still going through some difficulties in the relationship that has affected his mood last year.  Tolerating wellbturin and other medications.   Insomnia : baseline. Takes ativan and also for day anxiety Depression severity is 6.5 out of 10  Motivation remains subjectively poor.      Review of Systems  Constitutional:  Negative for fever.  Cardiovascular: Negative for palpitations.  Gastrointestinal: Negative for nausea.  Skin: Negative for rash.  Psychiatric/Behavioral: Positive for depression. Negative for suicidal ideas.     Vitals:   07/04/16 1017  BP: 126/74  Pulse: 88  Resp: 16  SpO2: 95%  Weight: (!) 373 lb (169.2 kg)  Height: '5\' 9"'$  (1.753 m)    Physical Exam  Constitutional: No distress.  Obese  Skin: He is not diaphoretic.    Past Medical History: Reviewed  Past Medical History:  Diagnosis Date  . Anemia   . Diabetes mellitus    controlled  . ED (erectile dysfunction)   . Gout   . Kidney stones    stent, lithotripsy  . Lymphadenopathy   . Morbid obesity ([place])   . OSA (obstructive sleep apnea)    CPAP-17.5 cm water pressure  . Paroxysmal atrial fibrillation (HCC)   . Pneumonia   . S/P emergency tracheotomy for assistance in breathing [facility])    at age 83  . Testosterone deficiency   . Vitamin D deficiency      Allergies:  Allergies  Allergen Reactions  . Colchicine Shortness Of Breath, Swelling and Itching  . Peach Flavor     "Peaches" " swelling of face"     Current Medications: Current Outpatient Prescriptions on File Prior to Visit  Medication Sig Dispense Refill  . allopurinol (ZYLOPRIM) 100 MG tablet Take 1 tablet (100 mg total) by mouth daily. 30 tablet 1  . AMBULATORY  NON FORMULARY MEDICATION Medication Name: CPAP machine with nasal mask and supplies set to 17.5 cm water pressure.  No humidifier.  Sent to [facility].  See sleep study from 2009 attached.  Dx: OSA. 1 Units PRN.  Marland Kitchen AMBULATORY NON FORMULARY MEDICATION Knee high graduated compression stockings medium. I83.93 1 each 0  . AMBULATORY NON FORMULARY MEDICATION Medication Name: Cyclobenzaprine 2% gabapentin 3% Baclofen 2%  Diclofenac 3% Qty# 120 ml Sig: apply small amount topically to effected area 2-3 times a day as directed [phone] (Fax) 120 mL 2  . amiodarone (PACERONE) 200 MG tablet Take 1  tablet (200 mg total) by mouth daily. 90 tablet 3  . ANDROGEL PUMP 20.25 MG/ACT (1.62%) GEL place 4 pumps on the skin every morning 150 g 4  . aspirin EC 81 MG tablet Take 1 tablet (81 mg total) by mouth daily. 90 tablet 3  . blood glucose meter kit and supplies KIT Dispense based on patient and insurance preference. Use up to four times daily as directed. (FOR ICD-9 250.00, 250.01). 1 each 0  . buPROPion (WELLBUTRIN SR) 150 MG 12 hr tablet Take 2 tablets (300 mg total) by mouth daily. 60 tablet 1  . diltiazem (CARDIZEM CD) 240 MG 24 hr capsule Take 1 capsule (240 mg total) by mouth daily. 90 capsule 3  . escitalopram (LEXAPRO) 20 MG tablet Take '20mg'$  tablets twice a day. 60 tablet 1  . HYDROcodone-acetaminophen (NORCO) 10-325 MG per tablet Take 1 tablet by mouth every 8 (eight) hours as needed. 90 tablet 0  . liraglutide (VICTOZA) 18 MG/3ML SOPN Inject 0.3 mLs (1.8 mg total) into the skin daily. 3 pen 5  . lisinopril (PRINIVIL,ZESTRIL) 5 MG tablet Take 1 tablet (5 mg total) by mouth daily. 90 tablet 3  . LORazepam (ATIVAN) 1 MG tablet TAKE 1 TABLET BY MOUTH EVERY 8 HOURS only as needed 90 tablet 1  . metFORMIN (GLUCOPHAGE) 500 MG tablet Take 1 tablet (500 mg total) by mouth 2 (two) times daily with a meal. 180 tablet 0  . oxyCODONE (ROXICODONE) 15 MG immediate release tablet   0  . pravastatin (PRAVACHOL) 40 MG tablet Take 1 tablet (40 mg total) by mouth daily. 90 tablet 3  . tiZANidine (ZANAFLEX) 4 MG tablet Take 4 mg by mouth once.      Current Facility-Administered Medications on File Prior to Visit  Medication Dose Route Frequency Provider Last Rate Last Dose  . technetium tetrofosmin (TC-MYOVIEW) injection 29.9 millicurie  37.1 millicurie Intravenous Once PRN Lelon Perla, MD            Social History   Social History  . Marital status: Married    Spouse name: N/A  . Number of children: N/A  . Years of education: N/A   Social History Main Topics  . Smoking status: Never  Smoker  . Smokeless tobacco: Never Used  . Alcohol use No  . Drug use: No     Comment: prior cocaine abuse (heavy) in his 11s.  denies subsequent use  . Sexual activity: Yes    Partners: Female    Birth control/ protection: None     Comment: on full disability, separated, no regular exercise,walks some, drinks pot of coffee a day.   Other Topics Concern  . None   Social History Narrative   Drinks one pot of coffee daily.   Regular exercise-no, walks some      Lives in [place] with roommate.   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    Caffeine: Caffeinated Beverages 1 5 hour energy   Family History: Reviewed  Family History  Problem Relation Age of Onset  . Cancer Mother     lung, heavy smoker  . Hypertension Mother   . Hyperlipidemia Mother   . Cancer Father     melanoma  . Aneurysm Father     cardiac  . Hyperlipidemia Father   . Hypertension Father   . Hyperlipidemia Sister   . Hypertension Sister   . Hyperlipidemia Brother   . Hypertension Brother   . Stroke Other     Psychiatric Specialty Examination: Objective: Appearance: Casual   Eye Contact:: Good   Speech: Clear and Coherent and Normal Rate   Volume: Normal   Mood: somewhat dysphoric   Affect: reactive  Thought Process: Coherent, Linear and Logical   Orientation: Full   Thought Content: WDL   Suicidal Thoughts: No   Homicidal Thoughts: No   Judgement: Fair   Insight: Good   Psychomotor Activity: Normal   Akathisia: Yes   Handed: Right   Memory-immediate 3/3; recent-2/3  Fund of knowledge-average to above average  Language-Intact  AIMS (if indicated): None   Assets: Communication Skills  Desire for Improvement  Financial Resources/Insurance  Physical Health    Laboratory/X-Ray  Psychological Evaluation(s)   none  Not available    Assessment:  AXIS I  Major Depression, Recurrent . Rule out ADHD , Polysustance dependence in remission. GAD. insomnia  AXIS II   No diagnosis   AXIS III  Past Medical History    Diagnosis  Date    .  Atrial fibrillation     .  Lymphadenopathy     .  Testosterone deficiency     .  Anemia     .  Vitamin d deficiency     .  ED (erectile dysfunction)     .  Morbid obesity     .  OSA (obstructive sleep apnea)       CPAP-17.5 cm water pressure    .  Kidney stones       stent, lithotripsy    .  Lymphadenopathy     .  Diabetes mellitus       controlled    .  Testosterone deficiency     .  Excess or deficiency of vitamin D     .  ED (erectile dysfunction)       AXIS IV  economic problems, educational problems, occupational problems and problems with primary support group   AXIS V  60-moderate symptoms    Treatment Plan/Recommendations:  PLAN:   1. Depression: baseline. Continue wellbutrin. And lexapro 2. Anxiety: manageable with ativan 3. ADHD possibility: wants to be on adderall or vyvanse. Will start strattera '40mg'$  can increase after 7- 10 days to bid if tolerates ok. Cautioned About stimulant medication on blood pressure and heart beat. He feels persistent that he needs to be on ADHD medication and understands all the risks There is concern about polysubstance dependence or use of drugs in the past as of now he remains sober I mentioned that I would be uncomfortable changing it later other stimulants or to Adderall considering his heart condition and is prior history as stated above and he understands Also would do random urine drug testing 4. Insomnia : baseline FU 3-4 weeks or earlier if needed.     Merian Capron, M.D.  07/04/2016 10:39 AM

## 2016-07-08 DIAGNOSIS — F4323 Adjustment disorder with mixed anxiety and depressed mood: Secondary | ICD-10-CM | POA: Diagnosis not present

## 2016-07-09 DIAGNOSIS — E119 Type 2 diabetes mellitus without complications: Secondary | ICD-10-CM | POA: Diagnosis not present

## 2016-07-09 DIAGNOSIS — Z6841 Body Mass Index (BMI) 40.0 and over, adult: Secondary | ICD-10-CM | POA: Diagnosis not present

## 2016-07-09 DIAGNOSIS — R7989 Other specified abnormal findings of blood chemistry: Secondary | ICD-10-CM | POA: Diagnosis not present

## 2016-07-09 DIAGNOSIS — I10 Essential (primary) hypertension: Secondary | ICD-10-CM | POA: Diagnosis not present

## 2016-07-09 NOTE — Progress Notes (Signed)
HPI: FU atrial fibrillation. Echocardiogram in March of 2014 showed an ejection fraction of 40-45%, mild left atrial enlargement and grade 2 diastolic dysfunction. Study was technically difficult. Nuclear study in April of 2014 at [facility] showed an ejection fraction greater than 65% and normal perfusion. Patient had an outpatient monitor for recurrent palpitations which revealed wide-complex tachycardia. The patient was seen by Dr. Rayann Heman and this was felt to be atrial flutter with one-to-one conduction. He was started on flecainide. Followup exercise treadmill showed no induced ventricular arrhythmias but there was a rate related left bundle branch block. Flecanide changed to amiodarone previously at [facility]. Patient felt not to be a candidate for ablation due to size. Hospitalized at [facility] 8/15 with GI bleed and anticoagulation DCed. Patient did have a colonoscopy which revealed pandiverticulosis. He also was transfused 8 units of packed red blood cells. Discharge summary states anticoagulation can be resumed in one week if hemoglobin stable. Patient decided to continue aspirin and avoid Coumadin. Nuclear study March 2018 showed ejection fraction 34% and ischemia in the inferior, inferolateral and apical inferior walls. Since I last saw him, he has mild dyspnea on exertion but no orthopnea, PND, pedal edema or syncope. He has occasional pain in the right or left side of his chest not associated with activities lasting 2 minutes at a time. Can increase with eating.  Current Outpatient Prescriptions  Medication Sig Dispense Refill  . allopurinol (ZYLOPRIM) 100 MG tablet Take 1 tablet (100 mg total) by mouth daily. 30 tablet 1  . AMBULATORY NON FORMULARY MEDICATION Medication Name: CPAP machine with nasal mask and supplies set to 17.5 cm water pressure.  No humidifier.  Sent to [employer].  See sleep study from 2009 attached.  Dx: OSA. 1 Units PRN.  Marland Kitchen AMBULATORY NON FORMULARY MEDICATION Knee high  graduated compression stockings medium. I83.93 1 each 0  . AMBULATORY NON FORMULARY MEDICATION Medication Name: Cyclobenzaprine 2% gabapentin 3% Baclofen 2%  Diclofenac 3% Qty# 120 ml Sig: apply small amount topically to effected area 2-3 times a day as directed [phone] (Fax) 120 mL 2  . amiodarone (PACERONE) 200 MG tablet Take 1 tablet (200 mg total) by mouth daily. 90 tablet 3  . ANDROGEL PUMP 20.25 MG/ACT (1.62%) GEL place 4 pumps on the skin every morning 150 g 4  . aspirin EC 81 MG tablet Take 1 tablet (81 mg total) by mouth daily. 90 tablet 3  . atomoxetine (STRATTERA) 40 MG capsule Take 1 capsule (40 mg total) by mouth daily. 30 capsule 0  . blood glucose meter kit and supplies KIT Dispense based on patient and insurance preference. Use up to four times daily as directed. (FOR ICD-9 250.00, 250.01). 1 each 0  . buPROPion (WELLBUTRIN SR) 150 MG 12 hr tablet Take 2 tablets (300 mg total) by mouth daily. 60 tablet 1  . diltiazem (CARDIZEM CD) 240 MG 24 hr capsule Take 1 capsule (240 mg total) by mouth daily. 90 capsule 3  . escitalopram (LEXAPRO) 20 MG tablet Take '20mg'$  tablets twice a day. 60 tablet 1  . HYDROcodone-acetaminophen (NORCO) 10-325 MG per tablet Take 1 tablet by mouth every 8 (eight) hours as needed. 90 tablet 0  . liraglutide (VICTOZA) 18 MG/3ML SOPN Inject 0.3 mLs (1.8 mg total) into the skin daily. 3 pen 5  . lisinopril (PRINIVIL,ZESTRIL) 5 MG tablet Take 1 tablet (5 mg total) by mouth daily. 90 tablet 3  . LORazepam (ATIVAN) 1 MG tablet TAKE 1 TABLET  BY MOUTH EVERY 8 HOURS only as needed 90 tablet 1  . metFORMIN (GLUCOPHAGE) 500 MG tablet Take 1 tablet (500 mg total) by mouth 2 (two) times daily with a meal. 180 tablet 0  . oxyCODONE (ROXICODONE) 15 MG immediate release tablet   0  . pravastatin (PRAVACHOL) 40 MG tablet Take 1 tablet (40 mg total) by mouth daily. 90 tablet 3  . tiZANidine (ZANAFLEX) 4 MG tablet Take 4 mg by mouth once.      No current  facility-administered medications for this visit.    Facility-Administered Medications Ordered in Other Visits  Medication Dose Route Frequency Provider Last Rate Last Dose  . technetium tetrofosmin (TC-MYOVIEW) injection 66.4 millicurie  40.3 millicurie Intravenous Once PRN Lelon Perla, MD         Past Medical History:  Diagnosis Date  . Anemia   . Diabetes mellitus    controlled  . ED (erectile dysfunction)   . Gout   . Kidney stones    stent, lithotripsy  . Lymphadenopathy   . Morbid obesity ([place])   . OSA (obstructive sleep apnea)    CPAP-17.5 cm water pressure  . Paroxysmal atrial fibrillation (HCC)   . Pneumonia   . S/P emergency tracheotomy for assistance in breathing [facility])    at age 56  . Testosterone deficiency   . Vitamin D deficiency     Past Surgical History:  Procedure Laterality Date  . CHOLECYSTECTOMY    . CYSTOSCOPY     retrograde and double J catheter insertion.  Marland Kitchen FINGER SURGERY     ulnar digital nerve and artery right index finger  . GASTRIC BYPASS  2005   600 lbs prior to surgery  . TONSILLECTOMY      Social History   Social History  . Marital status: Married    Spouse name: N/A  . Number of children: N/A  . Years of education: N/A   Occupational History  . Not on file.   Social History Main Topics  . Smoking status: Never Smoker  . Smokeless tobacco: Never Used  . Alcohol use No  . Drug use: No     Comment: prior cocaine abuse (heavy) in his 18s.  denies subsequent use  . Sexual activity: Yes    Partners: Female    Birth control/ protection: None     Comment: on full disability, separated, no regular exercise,walks some, drinks pot of coffee a day.   Other Topics Concern  . Not on file   Social History Narrative   Drinks one pot of coffee daily.   Regular exercise-no, walks some      Lives in [place] with roommate.   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    Family History  Problem  Relation Age of Onset  . Cancer Mother     lung, heavy smoker  . Hypertension Mother   . Hyperlipidemia Mother   . Cancer Father     melanoma  . Aneurysm Father     cardiac  . Hyperlipidemia Father   . Hypertension Father   . Hyperlipidemia Sister   . Hypertension Sister   . Hyperlipidemia Brother   . Hypertension Brother   . Stroke Other     ROS: no fevers or chills, productive cough, hemoptysis, dysphasia, odynophagia, melena, hematochezia, dysuria, hematuria, rash, seizure activity, orthopnea, PND, pedal edema, claudication. Remaining systems are negative.  Physical Exam: Well-developed Morbidly obese in no acute distress.  Skin is  warm and dry.  HEENT is normal.  Neck is supple.  Chest is clear to auscultation with normal expansion.  Cardiovascular exam is regular rate and rhythm.  Abdominal exam nontender or distended. No masses palpated. Extremities show no edema. neuro grossly intact   A/P  1 atrial fibrillation-patient remains in sinus rhythm. Continue amiodarone. We again discussed anticoagulation. Given his history of life-threatening bleed he would continue to like to avoid. He understands the higher risk of CVA. Add ASA 81 mg daily.  2 hypertension-blood pressure controlled. Continue present medications and follow.  3 morbid obesity-patient has been seen at the bariatric clinic in they are attempting to reduce his weight without surgery.  4 chest pain-symptoms with both typical and atypical features. Recent nuclear study showed reduced LV function and ischemia. I have reviewed the patient's study with him. The ischemia appears to be in the inferior wall. This certainly may be related to his size and inferior wall attenuation artifact. We discussed continued medical therapy versus catheterization and he would like to be conservative at this point. The study suggested his LV function was reduced. I will arrange an echocardiogram to further assess. I have also  explained that if he requires bariatric surgery he would need catheterization first. I will see him back in 6 months and he will contact us if his symptoms change.  Kirk Ruths, MD

## 2016-07-10 ENCOUNTER — Other Ambulatory Visit: Payer: Self-pay | Admitting: Family Medicine

## 2016-07-15 DIAGNOSIS — F4323 Adjustment disorder with mixed anxiety and depressed mood: Secondary | ICD-10-CM | POA: Diagnosis not present

## 2016-07-17 ENCOUNTER — Ambulatory Visit (INDEPENDENT_AMBULATORY_CARE_PROVIDER_SITE_OTHER): Payer: BLUE CROSS/BLUE SHIELD | Admitting: Cardiology

## 2016-07-17 ENCOUNTER — Encounter: Payer: Self-pay | Admitting: Cardiology

## 2016-07-17 VITALS — BP 138/88 | HR 89 | Ht 69.0 in | Wt 372.0 lb

## 2016-07-17 DIAGNOSIS — R072 Precordial pain: Secondary | ICD-10-CM

## 2016-07-17 DIAGNOSIS — I10 Essential (primary) hypertension: Secondary | ICD-10-CM

## 2016-07-17 DIAGNOSIS — I4891 Unspecified atrial fibrillation: Secondary | ICD-10-CM | POA: Diagnosis not present

## 2016-07-17 DIAGNOSIS — R0600 Dyspnea, unspecified: Secondary | ICD-10-CM

## 2016-07-17 NOTE — Patient Instructions (Signed)

## 2016-07-22 DIAGNOSIS — F4323 Adjustment disorder with mixed anxiety and depressed mood: Secondary | ICD-10-CM | POA: Diagnosis not present

## 2016-07-25 ENCOUNTER — Other Ambulatory Visit: Payer: Self-pay | Admitting: *Deleted

## 2016-07-25 MED ORDER — AMIODARONE HCL 200 MG PO TABS: 200.0000 mg | ORAL_TABLET | Freq: Every day | ORAL | 3 refills | Status: DC

## 2016-07-25 MED ORDER — DILTIAZEM HCL ER COATED BEADS 240 MG PO CP24: 240.0000 mg | ORAL_CAPSULE | Freq: Every day | ORAL | 3 refills | Status: DC

## 2016-07-25 NOTE — Telephone Encounter (Signed)
REFILL 

## 2016-07-26 ENCOUNTER — Ambulatory Visit (INDEPENDENT_AMBULATORY_CARE_PROVIDER_SITE_OTHER): Payer: BLUE CROSS/BLUE SHIELD | Admitting: Family Medicine

## 2016-07-26 ENCOUNTER — Encounter: Payer: Self-pay | Admitting: Family Medicine

## 2016-07-26 VITALS — BP 118/69 | HR 80 | Temp 98.5°F | Wt 367.0 lb

## 2016-07-26 DIAGNOSIS — J0100 Acute maxillary sinusitis, unspecified: Secondary | ICD-10-CM

## 2016-07-26 DIAGNOSIS — J301 Allergic rhinitis due to pollen: Secondary | ICD-10-CM

## 2016-07-26 MED ORDER — AMOXICILLIN 875 MG PO TABS: 875.0000 mg | ORAL_TABLET | Freq: Two times a day (BID) | ORAL | 0 refills | Status: DC

## 2016-07-26 NOTE — Patient Instructions (Addendum)

## 2016-07-26 NOTE — Progress Notes (Signed)
   Subjective:    Patient ID: Rodney Bright, male    DOB: Feb 11, 1961, 56 y.o.   MRN: [id number]  HPI 52 you make c/o of sinus congestion x 4 daysAnd fatigue. He reports that he's had some low-grade fevers at home. Intermittent sore throat. No cough. He says that he's had a headache across his 4 head on the right and lies side as well as maxillary pressure. He says the right is much worse than the left in his right ear is very painful today. He has some pain with swallowing. He's been taking an over-the-counter decongestant. He does feel Sherre Scarlet experiencing some spring allergies symptoms as well..      Review of Systems     Objective:   Physical Exam  Constitutional: He is oriented to person, place, and time. He appears well-developed and well-nourished.  HENT:  Head: Normocephalic and atraumatic.  Right Ear: External ear normal.  Left Ear: External ear normal.  Nose: Nose normal.  Mouth/Throat: Oropharynx is clear and moist.  TMs and canals are clear.   Eyes: Conjunctivae and EOM are normal. Pupils are equal, round, and reactive to light.  Neck: Neck supple. No thyromegaly present.  Cardiovascular: Normal rate and normal heart sounds.   Pulmonary/Chest: Effort normal and breath sounds normal.  Lymphadenopathy:    He has no cervical adenopathy.  Neurological: He is alert and oriented to person, place, and time.  Skin: Skin is warm and dry.  Psychiatric: He has a normal mood and affect.          Assessment & Plan:  Acute sinusitis-will treat with amoxicillin. Make sure hydrating well. Okay to continue over-the-counter medications if needed. He may want to consider a Flonase or Nasonex type product since he does have spring allergies as well. Follow-up if not better in one week.   Allergic rhinitis-recommend trial of Flonase or Nasonex

## 2016-07-29 DIAGNOSIS — F4323 Adjustment disorder with mixed anxiety and depressed mood: Secondary | ICD-10-CM | POA: Diagnosis not present

## 2016-07-30 ENCOUNTER — Ambulatory Visit (HOSPITAL_COMMUNITY): Payer: Self-pay | Admitting: Psychiatry

## 2016-08-01 ENCOUNTER — Ambulatory Visit (HOSPITAL_BASED_OUTPATIENT_CLINIC_OR_DEPARTMENT_OTHER): Admission: RE | Admit: 2016-08-01 | Payer: BLUE CROSS/BLUE SHIELD | Source: Ambulatory Visit

## 2016-08-02 DIAGNOSIS — F329 Major depressive disorder, single episode, unspecified: Secondary | ICD-10-CM | POA: Diagnosis not present

## 2016-08-03 ENCOUNTER — Other Ambulatory Visit: Payer: Self-pay | Admitting: Family Medicine

## 2016-08-05 DIAGNOSIS — F4323 Adjustment disorder with mixed anxiety and depressed mood: Secondary | ICD-10-CM | POA: Diagnosis not present

## 2016-08-14 ENCOUNTER — Ambulatory Visit (HOSPITAL_BASED_OUTPATIENT_CLINIC_OR_DEPARTMENT_OTHER)
Admission: RE | Admit: 2016-08-14 | Discharge: 2016-08-14 | Disposition: A | Discharge status: Home / Self Care | Payer: BLUE CROSS/BLUE SHIELD | Source: Ambulatory Visit | Attending: Cardiology | Admitting: Cardiology

## 2016-08-14 MED ORDER — PERFLUTREN LIPID MICROSPHERE
1.0000 mL | INTRAVENOUS | Status: AC | PRN
  Filled 2016-08-14: qty 10

## 2016-08-16 DIAGNOSIS — F4323 Adjustment disorder with mixed anxiety and depressed mood: Secondary | ICD-10-CM | POA: Diagnosis not present

## 2016-08-19 DIAGNOSIS — Z713 Dietary counseling and surveillance: Secondary | ICD-10-CM | POA: Diagnosis not present

## 2016-08-19 DIAGNOSIS — Z6841 Body Mass Index (BMI) 40.0 and over, adult: Secondary | ICD-10-CM | POA: Diagnosis not present

## 2016-08-21 ENCOUNTER — Ambulatory Visit (HOSPITAL_BASED_OUTPATIENT_CLINIC_OR_DEPARTMENT_OTHER)
Admission: RE | Admit: 2016-08-21 | Discharge: 2016-08-21 | Disposition: A | Discharge status: Home / Self Care | Payer: BLUE CROSS/BLUE SHIELD | Source: Ambulatory Visit | Attending: Cardiology | Admitting: Cardiology

## 2016-08-21 DIAGNOSIS — R0600 Dyspnea, unspecified: Secondary | ICD-10-CM | POA: Diagnosis not present

## 2016-08-21 DIAGNOSIS — I10 Essential (primary) hypertension: Secondary | ICD-10-CM | POA: Insufficient documentation

## 2016-08-21 DIAGNOSIS — E119 Type 2 diabetes mellitus without complications: Secondary | ICD-10-CM | POA: Diagnosis not present

## 2016-08-21 DIAGNOSIS — I4891 Unspecified atrial fibrillation: Secondary | ICD-10-CM | POA: Diagnosis not present

## 2016-08-21 DIAGNOSIS — Z6841 Body Mass Index (BMI) 40.0 and over, adult: Secondary | ICD-10-CM | POA: Insufficient documentation

## 2016-08-21 DIAGNOSIS — I071 Rheumatic tricuspid insufficiency: Secondary | ICD-10-CM | POA: Insufficient documentation

## 2016-08-21 NOTE — Progress Notes (Signed)
  Echocardiogram 2D Echocardiogram has been performed.  Janalyn HarderWest, Zohar Laing R 08/21/2016, 12:22 PM

## 2016-08-26 ENCOUNTER — Ambulatory Visit (HOSPITAL_COMMUNITY): Payer: Self-pay | Admitting: Psychiatry

## 2016-08-26 DIAGNOSIS — F4323 Adjustment disorder with mixed anxiety and depressed mood: Secondary | ICD-10-CM | POA: Diagnosis not present

## 2016-08-28 ENCOUNTER — Other Ambulatory Visit: Payer: Self-pay | Admitting: Family Medicine

## 2016-09-20 ENCOUNTER — Other Ambulatory Visit: Payer: Self-pay | Admitting: Family Medicine

## 2016-09-25 ENCOUNTER — Other Ambulatory Visit: Payer: Self-pay | Admitting: Family Medicine

## 2016-10-02 ENCOUNTER — Telehealth (HOSPITAL_COMMUNITY): Payer: Self-pay | Admitting: *Deleted

## 2016-10-02 ENCOUNTER — Other Ambulatory Visit: Payer: Self-pay | Admitting: Family Medicine

## 2016-10-02 NOTE — Telephone Encounter (Signed)
Patient called the office requesting a refill for ativan. [facility]. Informed patient he will need to contact [facility] for medication management. Nothing further is need at this time.

## 2016-10-07 ENCOUNTER — Telehealth: Payer: Self-pay

## 2016-10-07 NOTE — Telephone Encounter (Signed)
Med PA submitted through cover my meds.  Awaiting response. -EH/RMA

## 2016-10-16 DIAGNOSIS — M5417 Radiculopathy, lumbosacral region: Secondary | ICD-10-CM | POA: Diagnosis not present

## 2016-10-16 DIAGNOSIS — G603 Idiopathic progressive neuropathy: Secondary | ICD-10-CM | POA: Diagnosis not present

## 2016-10-16 DIAGNOSIS — Z79899 Other long term (current) drug therapy: Secondary | ICD-10-CM | POA: Diagnosis not present

## 2016-10-16 DIAGNOSIS — G5603 Carpal tunnel syndrome, bilateral upper limbs: Secondary | ICD-10-CM | POA: Diagnosis not present

## 2016-10-16 DIAGNOSIS — G8929 Other chronic pain: Secondary | ICD-10-CM | POA: Diagnosis not present

## 2016-10-16 DIAGNOSIS — G5623 Lesion of ulnar nerve, bilateral upper limbs: Secondary | ICD-10-CM | POA: Diagnosis not present

## 2016-10-16 DIAGNOSIS — G4701 Insomnia due to medical condition: Secondary | ICD-10-CM | POA: Diagnosis not present

## 2016-10-16 DIAGNOSIS — M5412 Radiculopathy, cervical region: Secondary | ICD-10-CM | POA: Diagnosis not present

## 2016-10-16 DIAGNOSIS — G4733 Obstructive sleep apnea (adult) (pediatric): Secondary | ICD-10-CM | POA: Diagnosis not present

## 2016-10-18 NOTE — Telephone Encounter (Signed)
Call patient: Rodney Bright need to come in and have 2 separate testosterone levels collected. Both first thing in the morning and both at least 2 weeks apart. He Rodney Bright need be off of any type of testosterone supplementation during that time for his AndroGel to get approved with his insurance. They would not accept the levels that were from several years ago. Okay to reorder testosterone lab

## 2016-10-18 NOTE — Telephone Encounter (Signed)
PA for Androgel rejected.

## 2016-10-21 ENCOUNTER — Other Ambulatory Visit: Payer: Self-pay | Admitting: Family Medicine

## 2016-10-21 DIAGNOSIS — E291 Testicular hypofunction: Secondary | ICD-10-CM

## 2016-10-21 DIAGNOSIS — F4323 Adjustment disorder with mixed anxiety and depressed mood: Secondary | ICD-10-CM | POA: Diagnosis not present

## 2016-10-21 NOTE — Telephone Encounter (Signed)
Information discussed with pt. Pt verbalized understanding. States that he currently taking testosterone supplementation. Advised pt to hold the medication for at least 2 weeks and go to the lab for the first of two testosterone labs.

## 2016-10-21 NOTE — Telephone Encounter (Signed)
Left vm requesting callback to discuss information.

## 2016-10-21 NOTE — Telephone Encounter (Signed)
Standing order for testosterone ordered. Test 1 was released and faxed to lab.

## 2016-10-28 DIAGNOSIS — F4323 Adjustment disorder with mixed anxiety and depressed mood: Secondary | ICD-10-CM | POA: Diagnosis not present

## 2016-11-05 DIAGNOSIS — M545 Low back pain: Secondary | ICD-10-CM | POA: Diagnosis not present

## 2016-11-05 DIAGNOSIS — M25552 Pain in left hip: Secondary | ICD-10-CM | POA: Diagnosis not present

## 2016-11-05 DIAGNOSIS — M9905 Segmental and somatic dysfunction of pelvic region: Secondary | ICD-10-CM | POA: Diagnosis not present

## 2016-11-05 DIAGNOSIS — M9903 Segmental and somatic dysfunction of lumbar region: Secondary | ICD-10-CM | POA: Diagnosis not present

## 2016-11-06 ENCOUNTER — Telehealth: Payer: Self-pay | Admitting: Family Medicine

## 2016-11-06 ENCOUNTER — Other Ambulatory Visit: Payer: Self-pay | Admitting: Family Medicine

## 2016-11-06 DIAGNOSIS — E119 Type 2 diabetes mellitus without complications: Secondary | ICD-10-CM

## 2016-11-06 NOTE — Telephone Encounter (Signed)
I called pt and left a message informing him he is over due for his diabetes follow up with Dr. Linford ArnoldMetheney and to call our office to schedule an apptm

## 2016-11-08 DIAGNOSIS — M9903 Segmental and somatic dysfunction of lumbar region: Secondary | ICD-10-CM | POA: Diagnosis not present

## 2016-11-08 DIAGNOSIS — M25552 Pain in left hip: Secondary | ICD-10-CM | POA: Diagnosis not present

## 2016-11-08 DIAGNOSIS — M9905 Segmental and somatic dysfunction of pelvic region: Secondary | ICD-10-CM | POA: Diagnosis not present

## 2016-11-08 DIAGNOSIS — M545 Low back pain: Secondary | ICD-10-CM | POA: Diagnosis not present

## 2016-11-11 DIAGNOSIS — F4323 Adjustment disorder with mixed anxiety and depressed mood: Secondary | ICD-10-CM | POA: Diagnosis not present

## 2016-11-18 DIAGNOSIS — F4323 Adjustment disorder with mixed anxiety and depressed mood: Secondary | ICD-10-CM | POA: Diagnosis not present

## 2016-11-25 DIAGNOSIS — M9902 Segmental and somatic dysfunction of thoracic region: Secondary | ICD-10-CM | POA: Diagnosis not present

## 2016-11-25 DIAGNOSIS — M25552 Pain in left hip: Secondary | ICD-10-CM | POA: Diagnosis not present

## 2016-11-25 DIAGNOSIS — M545 Low back pain: Secondary | ICD-10-CM | POA: Diagnosis not present

## 2016-11-25 DIAGNOSIS — M9903 Segmental and somatic dysfunction of lumbar region: Secondary | ICD-10-CM | POA: Diagnosis not present

## 2016-11-25 DIAGNOSIS — M9905 Segmental and somatic dysfunction of pelvic region: Secondary | ICD-10-CM | POA: Diagnosis not present

## 2016-11-25 DIAGNOSIS — M25551 Pain in right hip: Secondary | ICD-10-CM | POA: Diagnosis not present

## 2016-11-25 DIAGNOSIS — M9904 Segmental and somatic dysfunction of sacral region: Secondary | ICD-10-CM | POA: Diagnosis not present

## 2016-11-28 DIAGNOSIS — F419 Anxiety disorder, unspecified: Secondary | ICD-10-CM | POA: Diagnosis not present

## 2016-11-28 DIAGNOSIS — Z79899 Other long term (current) drug therapy: Secondary | ICD-10-CM | POA: Diagnosis not present

## 2016-11-28 DIAGNOSIS — N390 Urinary tract infection, site not specified: Secondary | ICD-10-CM | POA: Diagnosis not present

## 2016-11-28 DIAGNOSIS — Z91018 Allergy to other foods: Secondary | ICD-10-CM | POA: Diagnosis not present

## 2016-11-28 DIAGNOSIS — I10 Essential (primary) hypertension: Secondary | ICD-10-CM | POA: Diagnosis not present

## 2016-11-28 DIAGNOSIS — Z888 Allergy status to other drugs, medicaments and biological substances status: Secondary | ICD-10-CM | POA: Diagnosis not present

## 2016-11-28 DIAGNOSIS — M109 Gout, unspecified: Secondary | ICD-10-CM | POA: Diagnosis not present

## 2016-11-28 DIAGNOSIS — F329 Major depressive disorder, single episode, unspecified: Secondary | ICD-10-CM | POA: Diagnosis not present

## 2016-11-28 DIAGNOSIS — R9431 Abnormal electrocardiogram [ECG] [EKG]: Secondary | ICD-10-CM | POA: Diagnosis not present

## 2016-11-28 DIAGNOSIS — E1140 Type 2 diabetes mellitus with diabetic neuropathy, unspecified: Secondary | ICD-10-CM | POA: Diagnosis not present

## 2016-11-28 DIAGNOSIS — Z9884 Bariatric surgery status: Secondary | ICD-10-CM | POA: Diagnosis not present

## 2016-11-28 DIAGNOSIS — Z7984 Long term (current) use of oral hypoglycemic drugs: Secondary | ICD-10-CM | POA: Diagnosis not present

## 2016-11-28 DIAGNOSIS — J45909 Unspecified asthma, uncomplicated: Secondary | ICD-10-CM | POA: Diagnosis not present

## 2016-11-28 DIAGNOSIS — I4891 Unspecified atrial fibrillation: Secondary | ICD-10-CM | POA: Diagnosis not present

## 2016-11-28 DIAGNOSIS — R42 Dizziness and giddiness: Secondary | ICD-10-CM | POA: Diagnosis not present

## 2016-12-02 DIAGNOSIS — F4323 Adjustment disorder with mixed anxiety and depressed mood: Secondary | ICD-10-CM | POA: Diagnosis not present

## 2016-12-03 ENCOUNTER — Telehealth: Payer: Self-pay

## 2016-12-03 ENCOUNTER — Other Ambulatory Visit: Payer: Self-pay | Admitting: Family Medicine

## 2016-12-03 MED ORDER — AMIODARONE HCL 200 MG PO TABS: 200.0000 mg | ORAL_TABLET | Freq: Every day | ORAL | 3 refills | Status: DC

## 2016-12-03 MED ORDER — PRAVASTATIN SODIUM 40 MG PO TABS: 40.0000 mg | ORAL_TABLET | Freq: Every day | ORAL | 3 refills | Status: DC

## 2016-12-03 MED ORDER — LISINOPRIL 5 MG PO TABS: ORAL_TABLET | ORAL | 3 refills | Status: DC

## 2016-12-03 NOTE — Telephone Encounter (Signed)
Medication refill sent to pharmacy  

## 2016-12-06 DIAGNOSIS — M25552 Pain in left hip: Secondary | ICD-10-CM | POA: Diagnosis not present

## 2016-12-06 DIAGNOSIS — M9905 Segmental and somatic dysfunction of pelvic region: Secondary | ICD-10-CM | POA: Diagnosis not present

## 2016-12-06 DIAGNOSIS — M9903 Segmental and somatic dysfunction of lumbar region: Secondary | ICD-10-CM | POA: Diagnosis not present

## 2016-12-06 DIAGNOSIS — M9902 Segmental and somatic dysfunction of thoracic region: Secondary | ICD-10-CM | POA: Diagnosis not present

## 2016-12-06 DIAGNOSIS — M25551 Pain in right hip: Secondary | ICD-10-CM | POA: Diagnosis not present

## 2016-12-06 DIAGNOSIS — M545 Low back pain: Secondary | ICD-10-CM | POA: Diagnosis not present

## 2016-12-06 DIAGNOSIS — M9904 Segmental and somatic dysfunction of sacral region: Secondary | ICD-10-CM | POA: Diagnosis not present

## 2016-12-09 DIAGNOSIS — F4323 Adjustment disorder with mixed anxiety and depressed mood: Secondary | ICD-10-CM | POA: Diagnosis not present

## 2016-12-19 ENCOUNTER — Ambulatory Visit (INDEPENDENT_AMBULATORY_CARE_PROVIDER_SITE_OTHER): Payer: Self-pay | Admitting: Family Medicine

## 2016-12-19 ENCOUNTER — Encounter: Payer: Self-pay | Admitting: Family Medicine

## 2016-12-19 VITALS — BP 106/63 | HR 69 | Ht 69.0 in | Wt 370.0 lb

## 2016-12-19 DIAGNOSIS — E291 Testicular hypofunction: Secondary | ICD-10-CM

## 2016-12-19 DIAGNOSIS — M2550 Pain in unspecified joint: Secondary | ICD-10-CM

## 2016-12-19 DIAGNOSIS — M26622 Arthralgia of left temporomandibular joint: Secondary | ICD-10-CM

## 2016-12-19 DIAGNOSIS — Z23 Encounter for immunization: Secondary | ICD-10-CM

## 2016-12-19 DIAGNOSIS — E1142 Type 2 diabetes mellitus with diabetic polyneuropathy: Secondary | ICD-10-CM

## 2016-12-19 LAB — POCT GLYCOSYLATED HEMOGLOBIN (HGB A1C): Hemoglobin A1C: 6.7

## 2016-12-19 NOTE — Progress Notes (Signed)
Subjective:    CC: DM, testosterone  HPI:  Diabetes - no hypoglycemic events. No wounds or sores that are not healing well. No increased thirst or urination. Checking glucose at home. Taking medications as prescribed without any side effects.  Hypogonadism - currently off of his testosterone therapy. He does have the AndroGel at home but isn't currently taking it. For any refills or require prior authorization. He's not sure why as his insurance was pain for it up until about 6 weeks ago. He says that he notices a big difference in his energy levels when he is taking it versus when he is not. He would like to move forward with the prior authorization. He currently does 4 pumps of AndroGel.  He also wonders if he could have psoriatic arthritis. His sister and mother both have this. He himself does not have psoriasis but wonders if he could have urethritis component. The joint side effects and the most are his low back and bilateral hips but he also has knee and ankle pain at times as well. He is on chronic pain medications.  He's also been having a lot of some pain in his left ear that started about 4 days ago. He's not had any drainage or discharge from the ear. No other cold or respiratory symptoms. She said he said he took a bite into a piece of raisin bread and got such a sudden sharp shooting pain into his left ear it felt like someone was sticking a needle in it. He denies any popping or clicking in the TMJ joint.  Past medical history, Surgical history, Family history not pertinant except as noted below, Social history, Allergies, and medications have been entered into the medical record, reviewed, and corrections made.   Review of Systems: No fevers, chills, night sweats, weight loss, chest pain, or shortness of breath.   Objective:    General: Well Developed, well nourished, and in no acute distress.  Neuro: Alert and oriented x3, extra-ocular muscles intact, sensation grossly intact.   HEENT: Normocephalic, atraumatic, oropharynx is clear, TMs and canals are clear bilaterally. No significant cervical lymphadenopathy. No popping or clicking over the TMJ joint. But when he clenched his teeth he experienced a pain in his left ear not as intense as what he had previously experienced but similar.  Skin: Warm and dry, no rashes. Cardiac: Regular rate and rhythm, no murmurs rubs or gallops, no lower extremity edema.  Respiratory: Clear to auscultation bilaterally. Not using accessory muscles, speaking in full sentences.   Impression and Recommendations:   DM - Well controlled. Continue current regimen. Follow up in  3 months.    Hypogonadism - we'll move forward with prior authorization for his testosterone replacement. Since he has a 90 day supply he could certainly restart that if he would like.Likely we will need to check with his insurance in January to see what might be better covered on his insurance plan.  Polyarthralgia - Recommend CBC, sed rate, CRP, HLA b27, rhuem factor, anti-CCP, ANA. May also be secondary to OA from morbid obesity.   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 Avoid excessive chewing. Recommend eating soft foods for the next couple of weeks. And he can apply his Voltaren gel over the joint itself since he cannot take oral NSAIDS becAUSE OF HIS HISTORY OF GI bleed and bariatric surgery.

## 2016-12-23 DIAGNOSIS — F4323 Adjustment disorder with mixed anxiety and depressed mood: Secondary | ICD-10-CM | POA: Diagnosis not present

## 2016-12-24 NOTE — Addendum Note (Signed)
Addended by: Chalmers Cater on: 12/24/2016 03:19 PM   Modules accepted: Orders

## 2016-12-30 ENCOUNTER — Telehealth: Payer: Self-pay | Admitting: Family Medicine

## 2016-12-30 NOTE — Telephone Encounter (Signed)
Left message for patient to call so we can give him the instructions noted for lab work.

## 2016-12-30 NOTE — Telephone Encounter (Signed)
Call patient: Please let him know I did put in a lab order for him to go to the lab to further investigate the arthritis and joint issues that we have discussed. Sorry for the delay but it took a few days to figure out the right code from the tests that we need to order. Please tell him that when he goes to have his blood work drawn to make sure to tell them that the labs are from 9/13 and 9/18. Just want to make sure that they run them all appropriately.

## 2017-01-06 DIAGNOSIS — F4323 Adjustment disorder with mixed anxiety and depressed mood: Secondary | ICD-10-CM | POA: Diagnosis not present

## 2017-01-06 NOTE — Telephone Encounter (Signed)
Can we call again before closing this?

## 2017-01-06 NOTE — Telephone Encounter (Signed)
Pt advised of recommendations.Rodney Bright  

## 2017-01-27 DIAGNOSIS — F4323 Adjustment disorder with mixed anxiety and depressed mood: Secondary | ICD-10-CM | POA: Diagnosis not present

## 2017-02-03 DIAGNOSIS — F4323 Adjustment disorder with mixed anxiety and depressed mood: Secondary | ICD-10-CM | POA: Diagnosis not present

## 2017-02-07 ENCOUNTER — Other Ambulatory Visit: Payer: Self-pay | Admitting: Family Medicine

## 2017-02-07 DIAGNOSIS — E119 Type 2 diabetes mellitus without complications: Secondary | ICD-10-CM

## 2017-02-08 ENCOUNTER — Other Ambulatory Visit: Payer: Self-pay | Admitting: Family Medicine

## 2017-02-10 DIAGNOSIS — F4323 Adjustment disorder with mixed anxiety and depressed mood: Secondary | ICD-10-CM | POA: Diagnosis not present

## 2017-02-13 ENCOUNTER — Other Ambulatory Visit: Payer: Self-pay

## 2017-02-13 ENCOUNTER — Encounter (HOSPITAL_COMMUNITY): Payer: Self-pay | Admitting: Psychiatry

## 2017-02-13 ENCOUNTER — Ambulatory Visit (INDEPENDENT_AMBULATORY_CARE_PROVIDER_SITE_OTHER): Payer: Self-pay | Admitting: Psychiatry

## 2017-02-13 VITALS — BP 128/80 | HR 79 | Resp 16 | Ht 69.0 in | Wt 365.0 lb

## 2017-02-13 DIAGNOSIS — F5102 Adjustment insomnia: Secondary | ICD-10-CM

## 2017-02-13 DIAGNOSIS — F331 Major depressive disorder, recurrent, moderate: Secondary | ICD-10-CM

## 2017-02-13 DIAGNOSIS — F410 Panic disorder [episodic paroxysmal anxiety] without agoraphobia: Secondary | ICD-10-CM

## 2017-02-13 DIAGNOSIS — F411 Generalized anxiety disorder: Secondary | ICD-10-CM

## 2017-02-13 MED ORDER — LORAZEPAM 1 MG PO TABS: ORAL_TABLET | ORAL | 1 refills | Status: DC

## 2017-02-13 MED ORDER — ESCITALOPRAM OXALATE 20 MG PO TABS: ORAL_TABLET | ORAL | 1 refills | Status: DC

## 2017-02-13 MED ORDER — BUPROPION HCL ER (XL) 300 MG PO TB24: 300.0000 mg | ORAL_TABLET | Freq: Every day | ORAL | 1 refills | Status: DC

## 2017-02-13 NOTE — Progress Notes (Signed)
Patient ID: Rodney Bright, male   DOB: 1960/06/24, 56 y.o.   MRN: [id number]   [place] Follow-up Outpatient Visit  Rodney Bright [birth year]  Date: 02/13/2017  HPI Comments: Rodney Bright is a 56 y/o male with a past psychiatric history significant for symptoms of depression and anxiety. The patient was referred for psychiatric services for medication management.   Coming after 9 months have started seeing [facility] but now not happy with their services.   Last discussion here was about adhd and we were focusing on wellbutrin while he wanted to be on stimulants. I cautioned about that considering he has AFib , also he uses ativan for anxiety and then on pain medications  His wellbutrin is now '300mg'$ . He is taking realtor classes. Says energy level flucutates but wellbutrin has helped some Gets anxious easily and ativan helps he understands to keep limit the dose Also on lexapro  Depression related to finances, fatigue and low energy  Insomnia :not worse.   Depression severity : 6/10   Motivation: somewhat better then last visit    Review of Systems  Constitutional: Negative for fever.  Cardiovascular: Negative for chest pain and palpitations.  Gastrointestinal: Negative for nausea.  Skin: Negative for rash.  Psychiatric/Behavioral: Positive for depression. Negative for suicidal ideas.     Vitals:   02/13/17 1506  BP: 128/80  Pulse: 79  Resp: 16  SpO2: 97%  Weight: (!) 365 lb (165.6 kg)  Height: '5\' 9"'$  (1.753 m)    Physical Exam  Constitutional: No distress.  Obese  Skin: He is not diaphoretic.    Past Medical History: Reviewed  Past Medical History:  Diagnosis Date  . Anemia   . Diabetes mellitus    controlled  . ED (erectile dysfunction)   . Gout   . Kidney stones    stent, lithotripsy  . Lymphadenopathy   . Morbid obesity ([place])   . OSA (obstructive sleep apnea)    CPAP-17.5 cm water pressure  . Paroxysmal atrial  fibrillation (HCC)   . Pneumonia   . S/P emergency tracheotomy for assistance in breathing [facility])    at age 13  . Testosterone deficiency   . Vitamin D deficiency      Allergies:  Allergies  Allergen Reactions  . Colchicine Shortness Of Breath, Swelling and Itching  . Peach Flavor     "Peaches" " swelling of face"     Current Medications: Current Outpatient Medications on File Prior to Visit  Medication Sig Dispense Refill  . alfuzosin (UROXATRAL) 10 MG 24 hr tablet Take 10 mg by mouth daily.  10  . allopurinol (ZYLOPRIM) 100 MG tablet Take 1 tablet (100 mg total) daily by mouth. Needs labs 30 tablet 0  . AMBULATORY NON FORMULARY MEDICATION Medication Name: CPAP machine with nasal mask and supplies set to 17.5 cm water pressure.  No humidifier.  Sent to [facility].  See sleep study from 2009 attached.  Dx: OSA. 1 Units PRN.  Marland Kitchen AMBULATORY NON FORMULARY MEDICATION Medication Name: Cyclobenzaprine 2% gabapentin 3% Baclofen 2%  Diclofenac 3% Qty# 120 ml Sig: apply small amount topically to effected area 2-3 times a day as directed [phone] (Fax) 120 mL 2  . ANDROGEL PUMP 20.25 MG/ACT (1.62%) GEL place 4 pumps on the skin every morning 150 g 4  . aspirin EC 81 MG tablet Take 1 tablet (81 mg total) by mouth daily. 90 tablet 3  . blood glucose meter kit and supplies KIT Dispense based  on patient and insurance preference. Use up to four times daily as directed. (FOR ICD-9 250.00, 250.01). 1 each 0  . diltiazem (CARDIZEM CD) 240 MG 24 hr capsule Take 1 capsule (240 mg total) by mouth daily. 90 capsule 3  . HYDROcodone-acetaminophen (NORCO) 10-325 MG tablet Take 1 tablet by mouth every 8 (eight) hours as needed.  0  . lisinopril (PRINIVIL,ZESTRIL) 5 MG tablet Take 1 tablet (5 mg total) by mouth daily. 90 tablet 3  . metFORMIN (GLUCOPHAGE) 500 MG tablet Take 1 tablet (500 mg total) by mouth 2 (two) times daily with a meal. LAST REFILL. PLEASE CALL OFFICE AND SCHEDULE APPOINTMENT. 60 tablet 0   . oxyCODONE (ROXICODONE) 15 MG immediate release tablet   0  . pravastatin (PRAVACHOL) 40 MG tablet Take 1 tablet (40 mg total) by mouth daily. 90 tablet 3  . tiZANidine (ZANAFLEX) 4 MG tablet Take 4 mg by mouth once.     Marland Kitchen VICTOZA 18 MG/3ML SOPN Inject 0.3 mLs (1.8 mg total) into the skin daily. (54/1.8=30) 9 mL 2  . zolpidem (AMBIEN) 10 MG tablet Take 10 mg by mouth at bedtime.  2   No current facility-administered medications on file prior to visit.         Social History   Socioeconomic History  . Marital status: Married    Spouse name: None  . Number of children: None  . Years of education: None  . Highest education level: None  Social Needs  . Financial resource strain: None  . Food insecurity - worry: None  . Food insecurity - inability: None  . Transportation needs - medical: None  . Transportation needs - non-medical: None  Occupational History  . None  Tobacco Use  . Smoking status: Never Smoker  . Smokeless tobacco: Never Used  Substance and Sexual Activity  . Alcohol use: No  . Drug use: No    Comment: prior cocaine abuse (heavy) in his 62s.  denies subsequent use  . Sexual activity: Yes    Partners: Female    Birth control/protection: None    Comment: on full disability, separated, no regular exercise,walks some, drinks pot of coffee a day.  Other Topics Concern  . None  Social History Narrative   Drinks one pot of coffee daily.   Regular exercise-no, walks some      Lives in [place] with roommate.   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    Caffeine: Caffeinated Beverages 1 5 hour energy   Family History: Reviewed  Family History  Problem Relation Age of Onset  . Cancer Mother        lung, heavy smoker  . Hypertension Mother   . Hyperlipidemia Mother   . Cancer Father        melanoma  . Aneurysm Father        cardiac  . Hyperlipidemia Father   . Hypertension Father   . Hyperlipidemia Sister   .  Hypertension Sister   . Hyperlipidemia Brother   . Hypertension Brother   . Stroke Other     Psychiatric Specialty Examination: Objective: Appearance: Casual   Eye Contact:: Good   Speech: Clear and Coherent and Normal Rate   Volume: Normal   Mood: subdued but not hopeless  Affect:reactive  Thought Process: Coherent, Linear and Logical   Orientation: Full   Thought Content: WDL   Suicidal Thoughts: No   Homicidal Thoughts: No   Judgement: Fair   Insight:  Good   Psychomotor Activity: Normal   Akathisia: Yes   Handed: Right   Memory-immediate 3/3; recent-2/3  Fund of knowledge-average to above average  Language-Intact  AIMS (if indicated): None   Assets: Communication Skills  Desire for Improvement  Financial Resources/Insurance  Physical Health    Laboratory/X-Ray  Psychological Evaluation(s)   none  Not available    Assessment:  AXIS I  Major Depression, Recurrent . Rule out ADHD , Polysustance dependence in remission. GAD. insomnia  AXIS II  No diagnosis   AXIS III  Past Medical History    Diagnosis  Date    .  Atrial fibrillation     .  Lymphadenopathy     .  Testosterone deficiency     .  Anemia     .  Vitamin d deficiency     .  ED (erectile dysfunction)     .  Morbid obesity     .  OSA (obstructive sleep apnea)       CPAP-17.5 cm water pressure    .  Kidney stones       stent, lithotripsy    .  Lymphadenopathy     .  Diabetes mellitus       controlled    .  Testosterone deficiency     .  Excess or deficiency of vitamin D     .  ED (erectile dysfunction)       AXIS IV  economic problems, educational problems, occupational problems and problems with primary support group   AXIS V  60-moderate symptoms    Treatment Plan/Recommendations:  PLAN:   1. Depression: subdued. meds do help . Finances a stress. Provided supportive therapy. Continue therapy with Marjory Lies.  Continue wellbutrin now at '300mg'$ . lexapro '20mg'$  bid  2. Anxiety: fluctautes. Continue  lexapro. Ativan is tid. Avoid extra use and caution about tolerance 3. ADHD : possible related to multifactors including combination of medications like ativan, pain meds and depression Continue wellbutrin . Have more memo cards and activity level   Continue therapy. Reviewed concerns and questions addressed FU 2-3 months or early if needed    Merian Capron, M.D.  02/13/2017 3:27 PM

## 2017-02-17 DIAGNOSIS — F4323 Adjustment disorder with mixed anxiety and depressed mood: Secondary | ICD-10-CM | POA: Diagnosis not present

## 2017-02-21 ENCOUNTER — Telehealth: Payer: Self-pay | Admitting: Family Medicine

## 2017-02-21 NOTE — Telephone Encounter (Signed)
Received fax for Pa on Victoza filled out form and faxed back. Waiting on determination. - CF

## 2017-02-24 DIAGNOSIS — F4323 Adjustment disorder with mixed anxiety and depressed mood: Secondary | ICD-10-CM | POA: Diagnosis not present

## 2017-02-26 NOTE — Telephone Encounter (Signed)
OK, then he will just have to have . pLease call pt and let him know. 1.8 mg per day is the max he can do.

## 2017-02-26 NOTE — Telephone Encounter (Signed)
9 ml is the limit the patient can receive for Victoza, per [employer].

## 2017-03-03 ENCOUNTER — Other Ambulatory Visit: Payer: Self-pay | Admitting: Family Medicine

## 2017-03-03 DIAGNOSIS — F4323 Adjustment disorder with mixed anxiety and depressed mood: Secondary | ICD-10-CM | POA: Diagnosis not present

## 2017-03-03 NOTE — Telephone Encounter (Signed)
Pt advised.

## 2017-03-03 NOTE — Telephone Encounter (Signed)
I don't write this for him.

## 2017-03-04 NOTE — Telephone Encounter (Signed)
Left update on Pt's VM. Also reminded him he is due for DM follow up in December.

## 2017-03-05 ENCOUNTER — Other Ambulatory Visit: Payer: Self-pay | Admitting: Family Medicine

## 2017-03-10 DIAGNOSIS — F4323 Adjustment disorder with mixed anxiety and depressed mood: Secondary | ICD-10-CM | POA: Diagnosis not present

## 2017-03-17 DIAGNOSIS — F4323 Adjustment disorder with mixed anxiety and depressed mood: Secondary | ICD-10-CM | POA: Diagnosis not present

## 2017-03-28 ENCOUNTER — Other Ambulatory Visit: Payer: Self-pay | Admitting: Family Medicine

## 2017-03-28 DIAGNOSIS — E119 Type 2 diabetes mellitus without complications: Secondary | ICD-10-CM

## 2017-04-05 ENCOUNTER — Other Ambulatory Visit: Payer: Self-pay | Admitting: Family Medicine

## 2017-04-07 ENCOUNTER — Other Ambulatory Visit: Payer: Self-pay

## 2017-04-07 ENCOUNTER — Telehealth (HOSPITAL_COMMUNITY): Payer: Self-pay | Admitting: Psychiatry

## 2017-04-07 ENCOUNTER — Telehealth: Payer: Self-pay | Admitting: Family Medicine

## 2017-04-07 DIAGNOSIS — Z0000 Encounter for general adult medical examination without abnormal findings: Secondary | ICD-10-CM

## 2017-04-07 DIAGNOSIS — I10 Essential (primary) hypertension: Secondary | ICD-10-CM

## 2017-04-07 DIAGNOSIS — E1142 Type 2 diabetes mellitus with diabetic polyneuropathy: Secondary | ICD-10-CM

## 2017-04-07 MED ORDER — ALLOPURINOL 100 MG PO TABS: 100.0000 mg | ORAL_TABLET | Freq: Every day | ORAL | 0 refills | Status: DC

## 2017-04-07 MED ORDER — BUPROPION HCL ER (XL) 300 MG PO TB24: 300.0000 mg | ORAL_TABLET | Freq: Every day | ORAL | 0 refills | Status: DC

## 2017-04-07 NOTE — Telephone Encounter (Signed)
Pt scheduled his CPE for Jan 2019 and needs all labs entered so he can go to lab before. He also mentioned Testosterone and whatever else Dr. Linford ArnoldMetheney wanted to see

## 2017-04-07 NOTE — Telephone Encounter (Signed)
Sent over a one month supply of [facility]. Called and left VM informing patient. Patients' last visit was on 02/13/17, upcoming visit on 04/24/17. Nothing further is needed at this time.

## 2017-04-07 NOTE — Telephone Encounter (Signed)
Pt needs refill on wellbutrin sent to [facility].

## 2017-04-07 NOTE — Telephone Encounter (Signed)
Labs ordered. Left VM advising Pt.  

## 2017-04-15 ENCOUNTER — Telehealth (HOSPITAL_COMMUNITY): Payer: Self-pay

## 2017-04-15 DIAGNOSIS — F331 Major depressive disorder, recurrent, moderate: Secondary | ICD-10-CM

## 2017-04-15 DIAGNOSIS — F411 Generalized anxiety disorder: Secondary | ICD-10-CM

## 2017-04-15 MED ORDER — ESCITALOPRAM OXALATE 20 MG PO TABS: ORAL_TABLET | ORAL | 0 refills | Status: DC

## 2017-04-15 MED ORDER — LORAZEPAM 1 MG PO TABS: ORAL_TABLET | ORAL | 0 refills | Status: DC

## 2017-04-15 NOTE — Telephone Encounter (Signed)
Pharmacy sent fax requesting a refill on Lorazepam and Lexapro. Patient last seen on 02/13/17. Next visit on 04/24/17. Please review and advise.

## 2017-04-15 NOTE — Telephone Encounter (Signed)
Patient states that he needs both medications refilled. Sent Lexapro to pharmacy and faxed Lorazepam. Patient notified. Nothing further is needed at this time.

## 2017-04-15 NOTE — Telephone Encounter (Signed)
Ask patient if he needs refill . If needed now can give or fax for one month

## 2017-04-22 ENCOUNTER — Ambulatory Visit (INDEPENDENT_AMBULATORY_CARE_PROVIDER_SITE_OTHER): Payer: BLUE CROSS/BLUE SHIELD | Admitting: Family Medicine

## 2017-04-22 ENCOUNTER — Encounter: Payer: Self-pay | Admitting: Family Medicine

## 2017-04-22 ENCOUNTER — Other Ambulatory Visit: Payer: Self-pay | Admitting: Family Medicine

## 2017-04-22 VITALS — BP 130/62 | HR 56 | Ht 69.0 in | Wt 377.0 lb

## 2017-04-22 DIAGNOSIS — Z0000 Encounter for general adult medical examination without abnormal findings: Secondary | ICD-10-CM | POA: Diagnosis not present

## 2017-04-22 DIAGNOSIS — E291 Testicular hypofunction: Secondary | ICD-10-CM | POA: Diagnosis not present

## 2017-04-22 DIAGNOSIS — E1142 Type 2 diabetes mellitus with diabetic polyneuropathy: Secondary | ICD-10-CM

## 2017-04-22 DIAGNOSIS — F3341 Major depressive disorder, recurrent, in partial remission: Secondary | ICD-10-CM

## 2017-04-22 LAB — POCT GLYCOSYLATED HEMOGLOBIN (HGB A1C): Hemoglobin A1C: 6.3

## 2017-04-22 NOTE — Patient Instructions (Addendum)

## 2017-04-22 NOTE — Progress Notes (Signed)
Subjective:    Patient ID: Rodney Bright, male    DOB: January 04, 1961, 57 y.o.   MRN: [id number]  HPI 57 year old male is here today for complete physical exam.  He says he is felt more low mood and anxiety and had difficulty with his weight since being off his testosterone.  His therapy was denied by his insurance.  We had called him back in the summer and let him know that he would have to repeat morning testosterone levels to qualify through his insurance.  Even though he is Artie been on supplementation for several years.  He did not return to have the blood work drawn.  He does complain of little interest and pleasure doing things more than half the days as well as feeling down several days of the week.  He also reports feeling nervous and anxious several days of the week and difficulty relaxing.  He does report some irritability several days of the week.  He is not currently exercising.  He is in between jobs and [place] with his sister.    Diabetes - no hypoglycemic events. No wounds or sores that are not healing well. No increased thirst or urination. Checking glucose at home. Taking medications as prescribed without any side effects.   Review of Systems  Comprehensive review of systems is negative.  BP 130/62   Pulse (!) 56   Ht _0  (1.753 m)   Wt (!) 377 lb (171 kg)   SpO2 94%   BMI 55.67 kg/m     Allergies  Allergen Reactions  . Colchicine Shortness Of Breath, Swelling and Itching  . Peach Flavor     "Peaches" " swelling of face"    Past Medical History:  Diagnosis Date  . Anemia   . Diabetes mellitus    controlled  . ED (erectile dysfunction)   . Gout   . Kidney stones    stent, lithotripsy  . Lymphadenopathy   . Morbid obesity ([place])   . OSA (obstructive sleep apnea)    CPAP-17.5 cm water pressure  . Paroxysmal atrial fibrillation (HCC)   . Pneumonia   . S/P emergency tracheotomy for assistance in breathing [facility])    at age 89  . Testosterone deficiency   .  Vitamin D deficiency     Past Surgical History:  Procedure Laterality Date  . CHOLECYSTECTOMY    . CYSTOSCOPY     retrograde and double J catheter insertion.  Marland Kitchen FINGER SURGERY     ulnar digital nerve and artery right index finger  . GASTRIC BYPASS  2005   600 lbs prior to surgery  . TONSILLECTOMY      Social History   Socioeconomic History  . Marital status: Married    Spouse name: Not on file  . Number of children: Not on file  . Years of education: Not on file  . Highest education level: Not on file  Social Needs  . Financial resource strain: Not on file  . Food insecurity - worry: Not on file  . Food insecurity - inability: Not on file  . Transportation needs - medical: Not on file  . Transportation needs - non-medical: Not on file  Occupational History  . Not on file  Tobacco Use  . Smoking status: Never Smoker  . Smokeless tobacco: Never Used  Substance and Sexual Activity  . Alcohol use: No  . Drug use: No    Comment: prior cocaine abuse (heavy) in his 38s.  denies subsequent  use  . Sexual activity: Yes    Partners: Female    Birth control/protection: None    Comment: on full disability, separated, no regular exercise,walks some, drinks pot of coffee a day.  Other Topics Concern  . Not on file  Social History Narrative   Drinks one pot of coffee daily.   Regular exercise-no, walks some      Lives in [place] with roommate.   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    Family History  Problem Relation Age of Onset  . Cancer Mother        lung, heavy smoker  . Hypertension Mother   . Hyperlipidemia Mother   . Cancer Father        melanoma  . Aneurysm Father        cardiac  . Hyperlipidemia Father   . Hypertension Father   . Hyperlipidemia Sister   . Hypertension Sister   . Hyperlipidemia Brother   . Hypertension Brother   . Stroke Other     Outpatient Encounter Medications as of 04/22/2017  Medication Sig  .  alfuzosin (UROXATRAL) 10 MG 24 hr tablet Take 10 mg by mouth daily.  Marland Kitchen allopurinol (ZYLOPRIM) 100 MG tablet Take 1 tablet (100 mg total) by mouth daily. Needs labs. LAST REFILL PLEASE CALL TO SCHEDULE AN APPOINTMENT  . AMBULATORY NON FORMULARY MEDICATION Medication Name: CPAP machine with nasal mask and supplies set to 17.5 cm water pressure.  No humidifier.  Sent to [facility].  See sleep study from 2009 attached.  Dx: OSA.  Marland Kitchen AMBULATORY NON FORMULARY MEDICATION Medication Name: Cyclobenzaprine 2% gabapentin 3% Baclofen 2%  Diclofenac 3% Qty# 120 ml Sig: apply small amount topically to effected area 2-3 times a day as directed [phone] (Fax)  . ANDROGEL PUMP 20.25 MG/ACT (1.62%) GEL place 4 pumps on the skin every morning  . aspirin EC 81 MG tablet Take 1 tablet (81 mg total) by mouth daily.  . blood glucose meter kit and supplies KIT Dispense based on patient and insurance preference. Use up to four times daily as directed. (FOR ICD-9 250.00, 250.01).  Marland Kitchen buPROPion (WELLBUTRIN XL) 300 MG 24 hr tablet Take 1 tablet (300 mg total) by mouth daily.  Marland Kitchen diltiazem (CARDIZEM CD) 240 MG 24 hr capsule Take 1 capsule (240 mg total) by mouth daily.  Marland Kitchen escitalopram (LEXAPRO) 20 MG tablet Take 46m tablets twice a day.  .Marland KitchenHYDROcodone-acetaminophen (NORCO) 10-325 MG tablet Take 1 tablet by mouth every 8 (eight) hours as needed.  .Marland Kitchenlisinopril (PRINIVIL,ZESTRIL) 5 MG tablet Take 1 tablet (5 mg total) by mouth daily.  .Marland KitchenLORazepam (ATIVAN) 1 MG tablet TAKE 1 TABLET BY MOUTH EVERY 8 HOURS only as needed  . metFORMIN (GLUCOPHAGE) 500 MG tablet Take 1 tablet (500 mg total) by mouth 2 (two) times daily with a meal. LAST REFILL. PLEASE CALL OFFICE AND SCHEDULE APPOINTMENT.  .Marland KitchenoxyCODONE (ROXICODONE) 15 MG immediate release tablet   . pravastatin (PRAVACHOL) 40 MG tablet Take 1 tablet (40 mg total) by mouth daily.  .Marland KitchentiZANidine (ZANAFLEX) 4 MG tablet Take 4 mg by mouth once.   .Marland KitchenVICTOZA 18 MG/3ML SOPN Inject 0.3 mLs  (1.8 mg total) into the skin daily. (54/1.8=30)  . zolpidem (AMBIEN) 10 MG tablet Take 10 mg by mouth at bedtime.   No facility-administered encounter medications on file as of 04/22/2017.           Objective:   Physical Exam  Constitutional: He is oriented to person, place, and time. He appears well-developed and well-nourished.  HENT:  Head: Normocephalic and atraumatic.  Right Ear: External ear normal.  Left Ear: External ear normal.  Nose: Nose normal.  Mouth/Throat: Oropharynx is clear and moist.  Eyes: Conjunctivae and EOM are normal. Pupils are equal, round, and reactive to light.  Neck: Normal range of motion. Neck supple. No thyromegaly present.  Cardiovascular: Normal rate, regular rhythm, normal heart sounds and intact distal pulses.  Pulmonary/Chest: Effort normal and breath sounds normal.  Abdominal: Soft. Bowel sounds are normal. He exhibits no distension and no mass. There is no tenderness. There is no rebound and no guarding.  Musculoskeletal: Normal range of motion.  Lymphadenopathy:    He has no cervical adenopathy.  Neurological: He is alert and oriented to person, place, and time. He has normal reflexes.  Skin: Skin is warm and dry.  Psychiatric: He has a normal mood and affect. His behavior is normal. Judgment and thought content normal.          Assessment & Plan:  Keep up a regular exercise program and make sure you are eating a healthy diet Try to eat 4 servings of dairy a day, or if you are lactose intolerant take a calcium with vitamin D daily.  Your vaccines are up to date.   Hypogonadism-we will recheck testosterone levels today.  Evidently his insurance company wanted proof of the fact that he had low testosterone.  We called him in July and asked him to return so that we did get confirmation with 2 labs at least 1 week apart.  But he never returned.  DM- Well controlled. Continue current regimen. Follow up in  4 months.   Atrial  fibrillation-did remind him to make sure to get back in with Dr. Stanford Breed in April of this year.  Major depressive disorder-he HQ 9 score of 11.  That he rates his symptoms as not difficult.  Gad 7 score of 6.  And he rates those symptoms is somewhat difficult.  He really feels like this is been driven by his lack of testosterone supplementation.  We will recheck his levels today and try to get him back on therapy.  He will likely need a second repeat testosterone draw in 1 week.

## 2017-04-24 ENCOUNTER — Ambulatory Visit (HOSPITAL_COMMUNITY): Payer: Self-pay | Admitting: Psychiatry

## 2017-04-24 LAB — CBC/DIFF AMBIGUOUS DEFAULT
Basophils Absolute: 0 10*3/uL (ref 0.0–0.2)
Basos: 0 %
EOS (ABSOLUTE): 0.1 10*3/uL (ref 0.0–0.4)
Eos: 1 %
Hematocrit: 38.8 % (ref 37.5–51.0)
Hemoglobin: 12.4 g/dL — ABNORMAL LOW (ref 13.0–17.7)
Immature Grans (Abs): 0 10*3/uL (ref 0.0–0.1)
Immature Granulocytes: 0 %
Lymphocytes Absolute: 1.4 10*3/uL (ref 0.7–3.1)
Lymphs: 21 %
MCH: 27.7 pg (ref 26.6–33.0)
MCHC: 32 g/dL (ref 31.5–35.7)
MCV: 87 fL (ref 79–97)
Monocytes Absolute: 0.5 10*3/uL (ref 0.1–0.9)
Monocytes: 7 %
Neutrophils Absolute: 4.7 10*3/uL (ref 1.4–7.0)
Neutrophils: 71 %
Platelets: 182 10*3/uL (ref 150–379)
RBC: 4.48 x10E6/uL (ref 4.14–5.80)
RDW: 15.1 % (ref 12.3–15.4)
WBC: 6.7 10*3/uL (ref 3.4–10.8)

## 2017-04-24 LAB — BASIC METABOLIC PANEL
BUN: 12 (ref 4–21)
Creatinine: 1.3 (ref 0.6–1.3)
Glucose: 116
Potassium: 4.9 (ref 3.4–5.3)
Sodium: 143 (ref 137–147)

## 2017-04-24 LAB — COMPREHENSIVE METABOLIC PANEL
ALT: 22 IU/L (ref 0–44)
AST: 26 IU/L (ref 0–40)
Albumin/Globulin Ratio: 1.6 (ref 1.2–2.2)
Albumin: 4.1 g/dL (ref 3.5–5.5)
Alkaline Phosphatase: 87 IU/L (ref 39–117)
BUN/Creatinine Ratio: 9 (ref 9–20)
BUN: 12 mg/dL (ref 6–24)
Bilirubin Total: 0.5 mg/dL (ref 0.0–1.2)
CO2: 24 mmol/L (ref 20–29)
Calcium: 9.2 mg/dL (ref 8.7–10.2)
Chloride: 104 mmol/L (ref 96–106)
Creatinine, Ser: 1.32 mg/dL — ABNORMAL HIGH (ref 0.76–1.27)
GFR calc Af Amer: 68 mL/min/{1.73_m2} (ref >59)
GFR calc non Af Amer: 59 mL/min/{1.73_m2} — ABNORMAL LOW (ref >59)
Globulin, Total: 2.6 g/dL (ref 1.5–4.5)
Glucose: 116 mg/dL — ABNORMAL HIGH (ref 65–99)
Potassium: 4.9 mmol/L (ref 3.5–5.2)
Sodium: 143 mmol/L (ref 134–144)
Total Protein: 6.7 g/dL (ref 6.0–8.5)

## 2017-04-24 LAB — TESTOSTERONE FREE, PROFILE I
TESTOSTERONE FREE: 1.3
Testost., % Free&Weakly Bound: 3.9
Testost., F&W Bound: 3
Testosterone, Free: 1.3
Testosterone: 76

## 2017-04-24 LAB — LIPID PANEL W/O CHOL/HDL RATIO
Cholesterol, Total: 142 mg/dL (ref 100–199)
HDL: 44 mg/dL (ref >39)
LDL Calculated: 77 mg/dL (ref 0–99)
Triglycerides: 107 mg/dL (ref 0–149)
VLDL Cholesterol Cal: 21 mg/dL (ref 5–40)

## 2017-04-24 LAB — HEPATIC FUNCTION PANEL
ALT: 22 (ref 10–40)
AST: 26 (ref 14–40)
Alkaline Phosphatase: 87 (ref 25–125)
Bilirubin, Total: 0.5

## 2017-04-24 LAB — LIPID PANEL
Cholesterol: 142 (ref 0–200)
HDL: 44 (ref 35–70)
LDL Cholesterol: 77
Triglycerides: 107 (ref 40–160)

## 2017-04-24 LAB — TESTOSTERONE, BIOAVAILABLE (M)
Testost., % Free&Weakly Bound: 3.9 % — ABNORMAL LOW (ref 9.0–46.0)
Testost., F&W Bound: 3 ng/dL — ABNORMAL LOW (ref 40.0–250.0)
Testosterone: 76 ng/dL — ABNORMAL LOW (ref 264–916)

## 2017-04-24 LAB — PSA
PSA: 1.5
Prostate Specific Ag, Serum: 1.5 ng/mL (ref 0.0–4.0)

## 2017-04-24 LAB — SPECIMEN STATUS REPORT

## 2017-04-24 LAB — CBC AND DIFFERENTIAL
HCT: 39 — AB (ref 41–53)
Hemoglobin: 12.4 — AB (ref 13.5–17.5)
Neutrophils Absolute: 5
Platelets: 182 (ref 150–399)
WBC: 6.7

## 2017-04-24 LAB — TSH
TSH: 2.18 (ref 0.41–5.90)
TSH: 2.18 u[IU]/mL (ref 0.450–4.500)

## 2017-04-24 LAB — TESTOSTERONE,FREE AND TOTAL: Testosterone, Free: 1.3 pg/mL — ABNORMAL LOW (ref 7.2–24.0)

## 2017-04-25 ENCOUNTER — Telehealth: Payer: Self-pay | Admitting: Family Medicine

## 2017-04-25 DIAGNOSIS — F4323 Adjustment disorder with mixed anxiety and depressed mood: Secondary | ICD-10-CM | POA: Diagnosis not present

## 2017-04-25 NOTE — Telephone Encounter (Signed)
Call patient: I received his lab results.  Hemoglobin was just borderline low at 12.4.  Make sure taking a daily multivitamin with iron.  In addition kidney function was mildly impaired.  We will keep an eye on this and plan to recheck in 6 months.  It is stable from previous.  Cholesterol looks good overall.  Testosterone was definitely low at 76.  Thyroid level looks great.  Prostate level is normal. We can try to get the testosterone re-approved.

## 2017-04-28 ENCOUNTER — Encounter: Payer: Self-pay | Admitting: Family Medicine

## 2017-04-28 NOTE — Telephone Encounter (Signed)
Called pt and informed of results and recommendations.[name], Maliek Schellhorn [place] Dam[place]etta

## 2017-04-29 ENCOUNTER — Ambulatory Visit (INDEPENDENT_AMBULATORY_CARE_PROVIDER_SITE_OTHER): Payer: BLUE CROSS/BLUE SHIELD | Admitting: Family Medicine

## 2017-04-29 VITALS — BP 119/81 | HR 62 | Temp 97.9°F | Resp 16 | Wt 371.0 lb

## 2017-04-29 DIAGNOSIS — E291 Testicular hypofunction: Secondary | ICD-10-CM

## 2017-04-29 MED ORDER — TESTOSTERONE CYPIONATE 200 MG/ML IM SOLN
200.0000 mg | Freq: Once | INTRAMUSCULAR | Status: AC
  Administered 2017-04-29: 200 mg via INTRAMUSCULAR

## 2017-04-29 NOTE — Progress Notes (Signed)
HPI: Patient is here for his testosterone injection. Denies chest pain, shortness of breath, headaches and any problems with this medication. Patient denies any mood changes.  Assessment and Plan: Administered injection to RUQ. Patient tolerated injection well without complications. Patient advised to schedule next injection in 14 days.  Lab work was re collected on patient to check testosterone levels due to time of previous collection. Reviewed and approved by provider.

## 2017-04-29 NOTE — Progress Notes (Signed)
Agree with documentation as above.   Catherine Metheney, MD  

## 2017-05-01 ENCOUNTER — Other Ambulatory Visit: Payer: Self-pay | Admitting: *Deleted

## 2017-05-01 ENCOUNTER — Ambulatory Visit (HOSPITAL_COMMUNITY): Payer: Self-pay | Admitting: Psychiatry

## 2017-05-01 DIAGNOSIS — E291 Testicular hypofunction: Secondary | ICD-10-CM

## 2017-05-01 LAB — TESTOSTERONE, BIOAVAILABLE (M)
Testost., % Free&Weakly Bound: 7.1 % — ABNORMAL LOW (ref 9.0–46.0)
Testost., F&W Bound: 11 ng/dL — ABNORMAL LOW (ref 40.0–250.0)
Testosterone: 155 ng/dL — ABNORMAL LOW (ref 264–916)

## 2017-05-01 LAB — TESTOSTERONE,FREE AND TOTAL: Testosterone, Free: 2.2 pg/mL — ABNORMAL LOW (ref 7.2–24.0)

## 2017-05-01 MED ORDER — TESTOSTERONE 20.25 MG/ACT (1.62%) TD GEL: TRANSDERMAL | 4 refills | Status: DC

## 2017-05-01 NOTE — Progress Notes (Signed)
Will send to pcp for signature.Annalina Needles [name]  

## 2017-05-05 DIAGNOSIS — F4323 Adjustment disorder with mixed anxiety and depressed mood: Secondary | ICD-10-CM | POA: Diagnosis not present

## 2017-05-06 ENCOUNTER — Telehealth: Payer: Self-pay | Admitting: Family Medicine

## 2017-05-06 ENCOUNTER — Ambulatory Visit (HOSPITAL_COMMUNITY): Payer: Self-pay | Admitting: Psychiatry

## 2017-05-06 NOTE — Telephone Encounter (Signed)
Received fax for PA on testosterone sent through cover my meds waiting on authorization. - CF

## 2017-05-09 NOTE — Telephone Encounter (Signed)
[facility] and [facility] coverage on Testosterone 20.25 MG TD Gel.   Reference Number: [id number] Valid Dates: 05/06/17 - 04/07/2038  I will notify pharmacy. - CF

## 2017-05-12 DIAGNOSIS — F4323 Adjustment disorder with mixed anxiety and depressed mood: Secondary | ICD-10-CM | POA: Diagnosis not present

## 2017-05-13 ENCOUNTER — Ambulatory Visit: Payer: Self-pay

## 2017-05-19 DIAGNOSIS — F4323 Adjustment disorder with mixed anxiety and depressed mood: Secondary | ICD-10-CM | POA: Diagnosis not present

## 2017-05-21 DIAGNOSIS — M5412 Radiculopathy, cervical region: Secondary | ICD-10-CM | POA: Diagnosis not present

## 2017-05-21 DIAGNOSIS — G4701 Insomnia due to medical condition: Secondary | ICD-10-CM | POA: Diagnosis not present

## 2017-05-21 DIAGNOSIS — G603 Idiopathic progressive neuropathy: Secondary | ICD-10-CM | POA: Diagnosis not present

## 2017-05-21 DIAGNOSIS — G5621 Lesion of ulnar nerve, right upper limb: Secondary | ICD-10-CM | POA: Diagnosis not present

## 2017-05-26 ENCOUNTER — Telehealth (HOSPITAL_COMMUNITY): Payer: Self-pay | Admitting: Psychiatry

## 2017-05-26 DIAGNOSIS — F4323 Adjustment disorder with mixed anxiety and depressed mood: Secondary | ICD-10-CM | POA: Diagnosis not present

## 2017-05-26 DIAGNOSIS — F331 Major depressive disorder, recurrent, moderate: Secondary | ICD-10-CM

## 2017-05-26 MED ORDER — ESCITALOPRAM OXALATE 20 MG PO TABS: ORAL_TABLET | ORAL | 0 refills | Status: DC

## 2017-05-26 NOTE — Telephone Encounter (Signed)
Pt has an apt on Thursday.  Needs refill on lexapro today.   [facility].

## 2017-05-26 NOTE — Telephone Encounter (Signed)
Sent over a one month supply of Lexapro 20 mg. Patient notified.

## 2017-05-29 ENCOUNTER — Encounter (HOSPITAL_COMMUNITY): Payer: Self-pay | Admitting: Psychiatry

## 2017-05-29 ENCOUNTER — Ambulatory Visit (INDEPENDENT_AMBULATORY_CARE_PROVIDER_SITE_OTHER): Payer: BLUE CROSS/BLUE SHIELD | Admitting: Psychiatry

## 2017-05-29 VITALS — BP 120/82 | HR 79 | Ht 69.0 in | Wt 367.0 lb

## 2017-05-29 DIAGNOSIS — F410 Panic disorder [episodic paroxysmal anxiety] without agoraphobia: Secondary | ICD-10-CM

## 2017-05-29 DIAGNOSIS — F411 Generalized anxiety disorder: Secondary | ICD-10-CM

## 2017-05-29 DIAGNOSIS — F5102 Adjustment insomnia: Secondary | ICD-10-CM | POA: Diagnosis not present

## 2017-05-29 DIAGNOSIS — F331 Major depressive disorder, recurrent, moderate: Secondary | ICD-10-CM

## 2017-05-29 DIAGNOSIS — F909 Attention-deficit hyperactivity disorder, unspecified type: Secondary | ICD-10-CM

## 2017-05-29 MED ORDER — LORAZEPAM 1 MG PO TABS: ORAL_TABLET | ORAL | 0 refills | Status: DC

## 2017-05-29 MED ORDER — BUPROPION HCL ER (XL) 300 MG PO TB24: 300.0000 mg | ORAL_TABLET | Freq: Every day | ORAL | 0 refills | Status: DC

## 2017-05-29 NOTE — Progress Notes (Signed)
Patient ID: Rodney Bright, male   DOB: [birth year], 57 y.o.   MRN: [id number]   [place] Follow-up Outpatient Visit  Rodney Bright July 11, 1960  Date: 05/29/2017  HPI Comments: [name] is a 57 y/o male with a past psychiatric history significant for symptoms of depression and anxiety. The patient was referred for psychiatric services for medication management.   Doing fair. Energy better since on testosterone. Not talking about fatigue or inattention today  Tolerating lexapro and wellbutrin  Ativan helps with anxiety not willing to cut down   Insomnia :not worse.   Depression severity :  Improved. No 7/10  Motivation: better    Review of Systems  Constitutional: Negative for fever.  Cardiovascular: Negative for chest pain and palpitations.  Gastrointestinal: Negative for nausea.  Skin: Negative for rash.  Psychiatric/Behavioral: Negative for depression, hallucinations and suicidal ideas.     Vitals:   05/29/17 1035  BP: 120/82  Pulse: 79  Weight: (!) 367 lb (166.5 kg)  Height: '5\' 9"'$  (1.753 m)    Physical Exam  Constitutional: No distress.  Obese  Skin: He is not diaphoretic.    Past Medical History: Reviewed  Past Medical History:  Diagnosis Date  . Anemia   . Diabetes mellitus    controlled  . ED (erectile dysfunction)   . Gout   . Kidney stones    stent, lithotripsy  . Lymphadenopathy   . Morbid obesity ([place])   . OSA (obstructive sleep apnea)    CPAP-17.5 cm water pressure  . Paroxysmal atrial fibrillation (HCC)   . Pneumonia   . S/P emergency tracheotomy for assistance in breathing [facility])    at age 39  . Testosterone deficiency   . Vitamin D deficiency      Allergies:  Allergies  Allergen Reactions  . Colchicine Shortness Of Breath, Swelling and Itching  . Peach Flavor     "Peaches" " swelling of face"     Current Medications: Current Outpatient Medications on File Prior to Visit  Medication Sig Dispense Refill  .  alfuzosin (UROXATRAL) 10 MG 24 hr tablet Take 10 mg by mouth daily.  10  . allopurinol (ZYLOPRIM) 100 MG tablet Take 1 tablet (100 mg total) by mouth daily. Needs labs. LAST REFILL PLEASE CALL TO SCHEDULE AN APPOINTMENT 30 tablet 0  . AMBULATORY NON FORMULARY MEDICATION Medication Name: CPAP machine with nasal mask and supplies set to 17.5 cm water pressure.  No humidifier.  Sent to [facility].  See sleep study from 2009 attached.  Dx: OSA. 1 Units PRN.  Marland Kitchen AMBULATORY NON FORMULARY MEDICATION Medication Name: Cyclobenzaprine 2% gabapentin 3% Baclofen 2%  Diclofenac 3% Qty# 120 ml Sig: apply small amount topically to effected area 2-3 times a day as directed [phone] (Fax) 120 mL 2  . aspirin EC 81 MG tablet Take 1 tablet (81 mg total) by mouth daily. 90 tablet 3  . blood glucose meter kit and supplies KIT Dispense based on patient and insurance preference. Use up to four times daily as directed. (FOR ICD-9 250.00, 250.01). 1 each 0  . diltiazem (CARDIZEM CD) 240 MG 24 hr capsule Take 1 capsule (240 mg total) by mouth daily. 90 capsule 3  . escitalopram (LEXAPRO) 20 MG tablet Take '20mg'$  tablets twice a day. 60 tablet 0  . HYDROcodone-acetaminophen (NORCO) 10-325 MG tablet Take 1 tablet by mouth every 8 (eight) hours as needed.  0  . lisinopril (PRINIVIL,ZESTRIL) 5 MG tablet Take 1 tablet (5 mg total)  by mouth daily. 90 tablet 3  . metFORMIN (GLUCOPHAGE) 500 MG tablet Take 1 tablet (500 mg total) by mouth 2 (two) times daily with a meal. LAST REFILL. PLEASE CALL OFFICE AND SCHEDULE APPOINTMENT. 60 tablet 0  . oxyCODONE (ROXICODONE) 15 MG immediate release tablet   0  . pravastatin (PRAVACHOL) 40 MG tablet Take 1 tablet (40 mg total) by mouth daily. 90 tablet 3  . Testosterone (ANDROGEL PUMP) 20.25 MG/ACT (1.62%) GEL place 4 pumps on the skin every morning 150 g 4  . tiZANidine (ZANAFLEX) 4 MG tablet Take 4 mg by mouth once.     Marland Kitchen VICTOZA 18 MG/3ML SOPN Inject 0.3 mLs (1.8 mg total) into the skin  daily. (54/1.8=30) 9 mL 2  . zolpidem (AMBIEN) 10 MG tablet Take 10 mg by mouth at bedtime.  2   No current facility-administered medications on file prior to visit.         Social History   Socioeconomic History  . Marital status: Married    Spouse name: None  . Number of children: None  . Years of education: None  . Highest education level: None  Social Needs  . Financial resource strain: None  . Food insecurity - worry: None  . Food insecurity - inability: None  . Transportation needs - medical: None  . Transportation needs - non-medical: None  Occupational History  . None  Tobacco Use  . Smoking status: Never Smoker  . Smokeless tobacco: Never Used  Substance and Sexual Activity  . Alcohol use: No  . Drug use: No    Comment: prior cocaine abuse (heavy) in his 56s.  denies subsequent use  . Sexual activity: Yes    Partners: Female    Birth control/protection: None    Comment: on full disability, separated, no regular exercise,walks some, drinks pot of coffee a day.  Other Topics Concern  . None  Social History Narrative   Drinks one pot of coffee daily.   Regular exercise-no, walks some      Lives in [place] with roommate.   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    Caffeine: Caffeinated Beverages 1 5 hour energy   Family History: Reviewed  Family History  Problem Relation Age of Onset  . Cancer Mother        lung, heavy smoker  . Hypertension Mother   . Hyperlipidemia Mother   . Cancer Father        melanoma  . Aneurysm Father        cardiac  . Hyperlipidemia Father   . Hypertension Father   . Hyperlipidemia Sister   . Hypertension Sister   . Hyperlipidemia Brother   . Hypertension Brother   . Stroke Other     Psychiatric Specialty Examination: Objective: Appearance: Casual   Eye Contact:: Good   Speech: Clear and Coherent and Normal Rate   Volume: Normal   Mood: improved  Affect:reactive  Thought Process:  Coherent, Linear and Logical   Orientation: Full   Thought Content: WDL   Suicidal Thoughts: No   Homicidal Thoughts: No   Judgement: Fair   Insight: Good   Psychomotor Activity: Normal   Akathisia: Yes   Handed: Right   Memory-immediate 3/3; recent-2/3  Fund of knowledge-average to above average  Language-Intact  AIMS (if indicated): None   Assets: Communication Skills  Desire for Improvement  Financial Resources/Insurance  Physical Health    Laboratory/X-Ray  Psychological Evaluation(s)   none  Not available    Assessment:  AXIS I  Major Depression, Recurrent . Rule out ADHD , Polysustance dependence in remission. GAD. insomnia  AXIS II  No diagnosis   AXIS III  Past Medical History    Diagnosis  Date    .  Atrial fibrillation     .  Lymphadenopathy     .  Testosterone deficiency     .  Anemia     .  Vitamin d deficiency     .  ED (erectile dysfunction)     .  Morbid obesity     .  OSA (obstructive sleep apnea)       CPAP-17.5 cm water pressure    .  Kidney stones       stent, lithotripsy    .  Lymphadenopathy     .  Diabetes mellitus       controlled    .  Testosterone deficiency     .  Excess or deficiency of vitamin D     .  ED (erectile dysfunction)       AXIS IV  economic problems, educational problems, occupational problems and problems with primary support group   AXIS V  60-moderate symptoms    Treatment Plan/Recommendations:  PLAN:   1. Depression: improved. Continue lexapro and wellbutrin   2. Anxiety: better  Continue lexapro 3. ADHD : better since on testosterone. Continue wellbutrin  Fu 2-3 m. Ativan and lexapro sent. Can call for more refills on meds.     Merian Capron, M.D.  05/29/2017 10:54 AM

## 2017-06-02 DIAGNOSIS — F4323 Adjustment disorder with mixed anxiety and depressed mood: Secondary | ICD-10-CM | POA: Diagnosis not present

## 2017-06-09 DIAGNOSIS — F4323 Adjustment disorder with mixed anxiety and depressed mood: Secondary | ICD-10-CM | POA: Diagnosis not present

## 2017-06-12 ENCOUNTER — Telehealth: Payer: Self-pay

## 2017-06-12 ENCOUNTER — Other Ambulatory Visit: Payer: Self-pay | Admitting: Family Medicine

## 2017-06-12 DIAGNOSIS — D649 Anemia, unspecified: Secondary | ICD-10-CM

## 2017-06-12 NOTE — Telephone Encounter (Signed)
Rodney Bright called and wanted to start getting vitamin B 12 injections. I advised him we have not checked his B 12 levels. He reports having a lot of fatigue. He would Dr Shelah LewandowskyMetheney's recommendations on if he should start B 12 injections.

## 2017-06-12 NOTE — Telephone Encounter (Signed)
ONly if we check his levels and they are low.

## 2017-06-12 NOTE — Telephone Encounter (Signed)
Med refilled.

## 2017-06-12 NOTE — Telephone Encounter (Signed)
Pt requesting refill on allopurinol. Pt to continue this long term? Ok to send?

## 2017-06-12 NOTE — Telephone Encounter (Signed)
Pt advised. Will order labs. Pt also questions if he can have a refill on his allopurinol.

## 2017-06-12 NOTE — Telephone Encounter (Signed)
Lab ordered. Pt wants to pick up slip.

## 2017-06-16 DIAGNOSIS — F4323 Adjustment disorder with mixed anxiety and depressed mood: Secondary | ICD-10-CM | POA: Diagnosis not present

## 2017-06-20 ENCOUNTER — Other Ambulatory Visit (HOSPITAL_COMMUNITY): Payer: Self-pay

## 2017-06-20 DIAGNOSIS — F331 Major depressive disorder, recurrent, moderate: Secondary | ICD-10-CM

## 2017-06-20 MED ORDER — ESCITALOPRAM OXALATE 20 MG PO TABS: ORAL_TABLET | ORAL | 1 refills | Status: DC

## 2017-06-23 DIAGNOSIS — F4323 Adjustment disorder with mixed anxiety and depressed mood: Secondary | ICD-10-CM | POA: Diagnosis not present

## 2017-06-30 DIAGNOSIS — F4323 Adjustment disorder with mixed anxiety and depressed mood: Secondary | ICD-10-CM | POA: Diagnosis not present

## 2017-07-05 ENCOUNTER — Other Ambulatory Visit: Payer: Self-pay | Admitting: Family Medicine

## 2017-07-07 ENCOUNTER — Telehealth (HOSPITAL_COMMUNITY): Payer: Self-pay

## 2017-07-07 DIAGNOSIS — F411 Generalized anxiety disorder: Secondary | ICD-10-CM

## 2017-07-07 DIAGNOSIS — F331 Major depressive disorder, recurrent, moderate: Secondary | ICD-10-CM

## 2017-07-07 MED ORDER — LORAZEPAM 1 MG PO TABS: ORAL_TABLET | ORAL | 0 refills | Status: DC

## 2017-07-07 NOTE — Telephone Encounter (Signed)
[facility] sent fax requesting a refill on Lorazepam 1mg . Last filled on 05/29/17. Patients' next office visit is in May. Please review and advise.

## 2017-07-07 NOTE — Telephone Encounter (Signed)
sent 

## 2017-07-08 DIAGNOSIS — F4323 Adjustment disorder with mixed anxiety and depressed mood: Secondary | ICD-10-CM | POA: Diagnosis not present

## 2017-07-14 DIAGNOSIS — F4323 Adjustment disorder with mixed anxiety and depressed mood: Secondary | ICD-10-CM | POA: Diagnosis not present

## 2017-07-21 DIAGNOSIS — F4323 Adjustment disorder with mixed anxiety and depressed mood: Secondary | ICD-10-CM | POA: Diagnosis not present

## 2017-07-28 DIAGNOSIS — F4323 Adjustment disorder with mixed anxiety and depressed mood: Secondary | ICD-10-CM | POA: Diagnosis not present

## 2017-08-04 DIAGNOSIS — F4323 Adjustment disorder with mixed anxiety and depressed mood: Secondary | ICD-10-CM | POA: Diagnosis not present

## 2017-08-11 ENCOUNTER — Other Ambulatory Visit: Payer: Self-pay

## 2017-08-11 DIAGNOSIS — G609 Hereditary and idiopathic neuropathy, unspecified: Secondary | ICD-10-CM

## 2017-08-11 DIAGNOSIS — F4323 Adjustment disorder with mixed anxiety and depressed mood: Secondary | ICD-10-CM | POA: Diagnosis not present

## 2017-08-11 MED ORDER — AMBULATORY NON FORMULARY MEDICATION: 2 refills | Status: DC

## 2017-08-14 ENCOUNTER — Other Ambulatory Visit: Payer: Self-pay

## 2017-08-14 ENCOUNTER — Other Ambulatory Visit (HOSPITAL_COMMUNITY): Payer: Self-pay

## 2017-08-14 DIAGNOSIS — F331 Major depressive disorder, recurrent, moderate: Secondary | ICD-10-CM

## 2017-08-14 MED ORDER — ESCITALOPRAM OXALATE 20 MG PO TABS: ORAL_TABLET | ORAL | 0 refills | Status: DC

## 2017-08-14 MED ORDER — DILTIAZEM HCL ER COATED BEADS 240 MG PO CP24: 240.0000 mg | ORAL_CAPSULE | Freq: Every day | ORAL | 0 refills | Status: DC

## 2017-08-18 ENCOUNTER — Telehealth (HOSPITAL_COMMUNITY): Payer: Self-pay | Admitting: Psychiatry

## 2017-08-18 DIAGNOSIS — F411 Generalized anxiety disorder: Secondary | ICD-10-CM

## 2017-08-18 DIAGNOSIS — F331 Major depressive disorder, recurrent, moderate: Secondary | ICD-10-CM

## 2017-08-18 DIAGNOSIS — F4323 Adjustment disorder with mixed anxiety and depressed mood: Secondary | ICD-10-CM | POA: Diagnosis not present

## 2017-08-18 MED ORDER — BUPROPION HCL ER (XL) 300 MG PO TB24: 300.0000 mg | ORAL_TABLET | Freq: Every day | ORAL | 0 refills | Status: DC

## 2017-08-18 MED ORDER — LORAZEPAM 1 MG PO TABS: ORAL_TABLET | ORAL | 0 refills | Status: DC

## 2017-08-18 NOTE — Telephone Encounter (Signed)
I just sent

## 2017-08-18 NOTE — Telephone Encounter (Signed)
Pt needs refills on bupropion and lorazepam sent to [facility].

## 2017-08-25 DIAGNOSIS — F4323 Adjustment disorder with mixed anxiety and depressed mood: Secondary | ICD-10-CM | POA: Diagnosis not present

## 2017-08-26 ENCOUNTER — Other Ambulatory Visit: Payer: Self-pay | Admitting: Family Medicine

## 2017-08-26 ENCOUNTER — Ambulatory Visit: Payer: Self-pay | Admitting: Family Medicine

## 2017-08-26 DIAGNOSIS — E291 Testicular hypofunction: Secondary | ICD-10-CM

## 2017-09-01 DIAGNOSIS — F4323 Adjustment disorder with mixed anxiety and depressed mood: Secondary | ICD-10-CM | POA: Diagnosis not present

## 2017-09-02 ENCOUNTER — Ambulatory Visit (INDEPENDENT_AMBULATORY_CARE_PROVIDER_SITE_OTHER): Payer: BLUE CROSS/BLUE SHIELD | Admitting: Psychiatry

## 2017-09-02 ENCOUNTER — Encounter (HOSPITAL_COMMUNITY): Payer: Self-pay | Admitting: Psychiatry

## 2017-09-02 VITALS — BP 136/96 | HR 68 | Ht 69.0 in | Wt 369.0 lb

## 2017-09-02 DIAGNOSIS — F900 Attention-deficit hyperactivity disorder, predominantly inattentive type: Secondary | ICD-10-CM | POA: Diagnosis not present

## 2017-09-02 DIAGNOSIS — F331 Major depressive disorder, recurrent, moderate: Secondary | ICD-10-CM | POA: Diagnosis not present

## 2017-09-02 DIAGNOSIS — F5102 Adjustment insomnia: Secondary | ICD-10-CM

## 2017-09-02 DIAGNOSIS — F1921 Other psychoactive substance dependence, in remission: Secondary | ICD-10-CM | POA: Diagnosis not present

## 2017-09-02 DIAGNOSIS — F411 Generalized anxiety disorder: Secondary | ICD-10-CM

## 2017-09-02 MED ORDER — ESCITALOPRAM OXALATE 20 MG PO TABS: ORAL_TABLET | ORAL | 2 refills | Status: DC

## 2017-09-02 MED ORDER — BUPROPION HCL ER (XL) 300 MG PO TB24: 300.0000 mg | ORAL_TABLET | Freq: Every day | ORAL | 2 refills | Status: DC

## 2017-09-02 NOTE — Progress Notes (Signed)
Patient ID: Rodney Bright, male   DOB: May 31, 1960, 57 y.o.   MRN: [id number]   Rodney Bright Follow-up Outpatient Visit  Rodney Bright 07/03/1960  Date: 09/02/2017  HPI Comments: Rodney Bright is a 57 y/o male with a past psychiatric history significant for symptoms of depression and anxiety. The patient was referred for psychiatric services for medication management.   Mood fair. Stays with sister. Talked about inattention causes amotivation Has to take ativan for anxiety which can also effect inattention Going thru therapy . Has gone thru a breakup recently   Ativan helps with anxiety not willing to cut down   Insomnia :not worse. On cpap machine  Depression severity :basleine. 7/10  Motivation: baseline    Review of Systems  Constitutional: Negative for fever.  Gastrointestinal: Negative for nausea.  Skin: Negative for rash.  Neurological: Negative for tremors.  Psychiatric/Behavioral: Negative for depression, hallucinations and suicidal ideas.     Vitals:   09/02/17 0958  BP: (!) 136/96  Pulse: 68  Weight: (!) 369 lb (167.4 kg)  Height: '5\' 9"'$  (1.753 m)    Physical Exam  Constitutional: No distress.  Obese  Skin: He is not diaphoretic.    Past Medical History: Reviewed  Past Medical History:  Diagnosis Date  . Anemia   . Diabetes mellitus    controlled  . ED (erectile dysfunction)   . Gout   . Kidney stones    stent, lithotripsy  . Lymphadenopathy   . Morbid obesity ([place])   . OSA (obstructive sleep apnea)    CPAP-17.5 cm water pressure  . Paroxysmal atrial fibrillation (HCC)   . Pneumonia   . S/P emergency tracheotomy for assistance in breathing [facility])    at age 63  . Testosterone deficiency   . Vitamin D deficiency      Allergies:  Allergies  Allergen Reactions  . Colchicine Shortness Of Breath, Swelling and Itching  . Peach Flavor     "Peaches" " swelling of face"     Current Medications: Current Outpatient Medications on  File Prior to Visit  Medication Sig Dispense Refill  . alfuzosin (UROXATRAL) 10 MG 24 hr tablet Take 10 mg by mouth daily.  10  . allopurinol (ZYLOPRIM) 100 MG tablet Take 1 tablet (100 mg total) by mouth daily. 90 tablet 1  . AMBULATORY NON FORMULARY MEDICATION Medication Name: CPAP machine with nasal mask and supplies set to 17.5 cm water pressure.  No humidifier.  Sent to [facility].  See sleep study from 2009 attached.  Dx: OSA. 1 Units PRN.  Marland Kitchen AMBULATORY NON FORMULARY MEDICATION Medication Name: Cyclobenzaprine 2% gabapentin 3% Baclofen 2%  Diclofenac 3% Qty# 120 ml Sig: apply small amount topically to effected area 2-3 times a day as directed Fax to [phone] 120 mL 2  . aspirin EC 81 MG tablet Take 1 tablet (81 mg total) by mouth daily. 90 tablet 3  . blood glucose meter kit and supplies KIT Dispense based on patient and insurance preference. Use up to four times daily as directed. (FOR ICD-9 250.00, 250.01). 1 each 0  . diltiazem (CARDIZEM CD) 240 MG 24 hr capsule Take 1 capsule (240 mg total) by mouth daily. 90 capsule 0  . HYDROcodone-acetaminophen (NORCO) 10-325 MG tablet Take 1 tablet by mouth every 8 (eight) hours as needed.  0  . lisinopril (PRINIVIL,ZESTRIL) 5 MG tablet Take 1 tablet (5 mg total) by mouth daily. 90 tablet 3  . LORazepam (ATIVAN) 1 MG tablet TAKE 1  TABLET BY MOUTH EVERY 8 HOURS only as needed 90 tablet 0  . metFORMIN (GLUCOPHAGE) 500 MG tablet Take 1 tablet (500 mg total) by mouth 2 (two) times daily with a meal. 60 tablet 0  . oxyCODONE (ROXICODONE) 15 MG immediate release tablet   0  . pravastatin (PRAVACHOL) 40 MG tablet Take 1 tablet (40 mg total) by mouth daily. 90 tablet 3  . Testosterone (ANDROGEL PUMP) 20.25 MG/ACT (1.62%) GEL place 4 pumps on the skin every morning 150 g 4  . tiZANidine (ZANAFLEX) 4 MG tablet Take 4 mg by mouth once.     Marland Kitchen VICTOZA 18 MG/3ML SOPN Inject 0.3 mLs (1.8 mg total) into the skin daily. (54/1.8=30) 9 mL 2  . zolpidem (AMBIEN) 10 MG  tablet Take 10 mg by mouth at bedtime.  2   No current facility-administered medications on file prior to visit.         Social History   Socioeconomic History  . Marital status: Married    Spouse name: Not on file  . Number of children: Not on file  . Years of education: Not on file  . Highest education level: Not on file  Occupational History  . Not on file  Social Needs  . Financial resource strain: Not on file  . Food insecurity:    Worry: Not on file    Inability: Not on file  . Transportation needs:    Medical: Not on file    Non-medical: Not on file  Tobacco Use  . Smoking status: Never Smoker  . Smokeless tobacco: Never Used  Substance and Sexual Activity  . Alcohol use: No  . Drug use: No    Comment: prior cocaine abuse (heavy) in his 1s.  denies subsequent use  . Sexual activity: Yes    Partners: Female    Birth control/protection: None    Comment: on full disability, separated, no regular exercise,walks some, drinks pot of coffee a day.  Lifestyle  . Physical activity:    Days per week: Not on file    Minutes per session: Not on file  . Stress: Not on file  Relationships  . Social connections:    Talks on phone: Not on file    Gets together: Not on file    Attends religious service: Not on file    Active member of club or organization: Not on file    Attends meetings of clubs or organizations: Not on file    Relationship status: Not on file  Other Topics Concern  . Not on file  Social History Narrative   Drinks one pot of coffee daily.   Regular exercise-no, walks some      Lives in [place] with roommate.   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    Caffeine: Caffeinated Beverages 1 5 hour energy   Family History: Reviewed  Family History  Problem Relation Age of Onset  . Cancer Mother        lung, heavy smoker  . Hypertension Mother   . Hyperlipidemia Mother   . Cancer Father        melanoma  . Aneurysm  Father        cardiac  . Hyperlipidemia Father   . Hypertension Father   . Hyperlipidemia Sister   . Hypertension Sister   . Hyperlipidemia Brother   . Hypertension Brother   . Stroke Other     Psychiatric Specialty Examination: Objective: Appearance: Casual  Eye Contact:: Good   Speech: Clear and Coherent and Normal Rate   Volume: Normal   Mood: fair  Affect:reactive  Thought Process: Coherent, Linear and Logical   Orientation: Full   Thought Content: WDL   Suicidal Thoughts: No   Homicidal Thoughts: No   Judgement: Fair   Insight: Good   Psychomotor Activity: Normal   Akathisia: Yes   Handed: Right   Memory-immediate 3/3; recent-2/3  Fund of knowledge-average to above average  Language-Intact  AIMS (if indicated): None   Assets: Communication Skills  Desire for Improvement  Financial Resources/Insurance  Physical Health    Laboratory/X-Ray  Psychological Evaluation(s)   none  Not available    Assessment:  AXIS I  Major Depression, Recurrent . Rule out ADHD , Polysustance dependence in remission. GAD. insomnia  AXIS II  No diagnosis   AXIS III  Past Medical History    Diagnosis  Date    .  Atrial fibrillation     .  Lymphadenopathy     .  Testosterone deficiency     .  Anemia     .  Vitamin d deficiency     .  ED (erectile dysfunction)     .  Morbid obesity     .  OSA (obstructive sleep apnea)       CPAP-17.5 cm water pressure    .  Kidney stones       stent, lithotripsy    .  Lymphadenopathy     .  Diabetes mellitus       controlled    .  Testosterone deficiency     .  Excess or deficiency of vitamin D     .  ED (erectile dysfunction)       AXIS IV  economic problems, educational problems, occupational problems and problems with primary support group   AXIS V  60-moderate symptoms    Treatment Plan/Recommendations:  PLAN:   1. Depression: fair . Continue lexapro. wellbutrin    2. Anxiety: fluctuates. Continue lexapro. Ativan. Cautioned  about its effect in long term 3. ADHD : feels distracted. Will send to [place] specialist for objective detail testing for ADHD Continue wellbutrin Fu 2-3 m. meds sent except for ativan. Call when ready   Merian Capron, M.D.  09/02/2017 10:08 AM

## 2017-09-05 ENCOUNTER — Encounter: Payer: Self-pay | Admitting: Family Medicine

## 2017-09-05 ENCOUNTER — Ambulatory Visit (INDEPENDENT_AMBULATORY_CARE_PROVIDER_SITE_OTHER): Payer: BLUE CROSS/BLUE SHIELD | Admitting: Family Medicine

## 2017-09-05 ENCOUNTER — Ambulatory Visit: Payer: Self-pay | Admitting: Family Medicine

## 2017-09-05 VITALS — BP 132/69 | HR 75 | Ht 69.0 in | Wt 373.0 lb

## 2017-09-05 DIAGNOSIS — R41840 Attention and concentration deficit: Secondary | ICD-10-CM | POA: Diagnosis not present

## 2017-09-05 DIAGNOSIS — Z6841 Body Mass Index (BMI) 40.0 and over, adult: Secondary | ICD-10-CM | POA: Diagnosis not present

## 2017-09-05 DIAGNOSIS — I10 Essential (primary) hypertension: Secondary | ICD-10-CM | POA: Diagnosis not present

## 2017-09-05 DIAGNOSIS — G4733 Obstructive sleep apnea (adult) (pediatric): Secondary | ICD-10-CM | POA: Diagnosis not present

## 2017-09-05 DIAGNOSIS — Z794 Long term (current) use of insulin: Secondary | ICD-10-CM | POA: Diagnosis not present

## 2017-09-05 DIAGNOSIS — E118 Type 2 diabetes mellitus with unspecified complications: Secondary | ICD-10-CM

## 2017-09-05 LAB — POCT GLYCOSYLATED HEMOGLOBIN (HGB A1C): Hemoglobin A1C: 6.5 % — AB (ref 4.0–5.6)

## 2017-09-05 MED ORDER — AMBULATORY NON FORMULARY MEDICATION: 0 refills | Status: DC

## 2017-09-05 NOTE — Progress Notes (Signed)
Subjective:    CC: HTN, DM  HPI:  Hypertension- Pt denies chest pain, SOB, dizziness, or heart palpitations.  Taking meds as directed w/o problems.  Denies medication side effects.    Diabetes - no hypoglycemic events. No wounds or sores that are not healing well. No increased thirst or urination. Checking glucose at home. Taking medications as prescribed without any side effects.  Hypogonadism -is finally able to get his topical testosterone and has been doing well with that.  He says he feels like it could be increased.  Morbid Obesity -actually planning on starting some weight training as of June 1.  His goal is to get stronger and healthier and lose some weight.  He will slowly work on increasing his cardio.  OSA- using hsi CPAP regularly but would like to have a printed rx for mask and tubing.    Also wanted to discuss inattention.  He has been seeing his current therapist for years, Isidor Holts.  He signed a release today so that I can speak with him.  He really feels that he probably has ADD and has had it for years.  He feels like it is making it very difficult for him to focus and stay on task.  He is interested in treatment.  He says in the past he is actually tried Strattera but it really was not helpful he is most interested in starting a stimulant medication.  Past medical history, Surgical history, Family history not pertinant except as noted below, Social history, Allergies, and medications have been entered into the medical record, reviewed, and corrections made.   Review of Systems: No fevers, chills, night sweats, weight loss, chest pain, or shortness of breath.   Objective:    General: Well Developed, well nourished, and in no acute distress.  Neuro: Alert and oriented x3, extra-ocular muscles intact, sensation grossly intact.  HEENT: Normocephalic, atraumatic  Skin: Warm and dry, no rashes. Cardiac: Regular rate and rhythm, no murmurs rubs or gallops, no lower  extremity edema.  Respiratory: Clear to auscultation bilaterally. Not using accessory muscles, speaking in full sentences.   Impression and Recommendations:    HTN - Well controlled. Continue current regimen. Follow up in  4 months.   DM - Well controlled. Continue current regimen. Follow up in  6 months.    Hypogonadism -due to recheck blood levels in July.  If they are on the low end of normal then we can certainly adjust his number of pumps per day.  Morbid Obesity -plans on starting an exercise regimen as of June 1.  OSA -1 and a paper prescription for supplies for his CPAP so he can switch to a new company.  Inattention- will speak with his therapist.  I am not sure if he is a good candidate for a stimulant. Will send a note to his cardiologist and see if they are OK with it or if needs an appt to discuss further. He will likely still need formal testing.   Time spent 40 min, > 50% spent counseling about DM, hypogonadism, obesity, OSA, and inattenttion.

## 2017-09-07 ENCOUNTER — Encounter: Payer: Self-pay | Admitting: Family Medicine

## 2017-09-08 ENCOUNTER — Telehealth: Payer: Self-pay

## 2017-09-08 DIAGNOSIS — F4323 Adjustment disorder with mixed anxiety and depressed mood: Secondary | ICD-10-CM | POA: Diagnosis not present

## 2017-09-08 NOTE — Telephone Encounter (Signed)
Veatrice BourbonAarron Bright, [facility] called and wants to talk with Dr Linford ArnoldMetheney about Rodney Bright. He recommends that he go on Adderall for his symptoms.   Address: [address], [place], [place] [address] Phone: [phone]

## 2017-09-16 ENCOUNTER — Other Ambulatory Visit: Payer: Self-pay | Admitting: Family Medicine

## 2017-09-16 NOTE — Telephone Encounter (Signed)
PT came in office today asking if Dr. Linford ArnoldMetheney would mind trying to call Clifton Custardaron again. Clifton Custardaron is on vacation this week 6/10-6/14. Thank you.

## 2017-09-17 ENCOUNTER — Telehealth (HOSPITAL_COMMUNITY): Payer: Self-pay

## 2017-09-17 DIAGNOSIS — F411 Generalized anxiety disorder: Secondary | ICD-10-CM

## 2017-09-17 DIAGNOSIS — F331 Major depressive disorder, recurrent, moderate: Secondary | ICD-10-CM

## 2017-09-17 MED ORDER — LORAZEPAM 1 MG PO TABS: ORAL_TABLET | ORAL | 0 refills | Status: DC

## 2017-09-17 NOTE — Telephone Encounter (Signed)
[facility] sent fax requesting a refill for Lorazepam 1mg . Last refilled on 08/18/17.

## 2017-09-17 NOTE — Telephone Encounter (Signed)
sent 

## 2017-09-22 DIAGNOSIS — F4323 Adjustment disorder with mixed anxiety and depressed mood: Secondary | ICD-10-CM | POA: Diagnosis not present

## 2017-09-24 ENCOUNTER — Telehealth: Payer: Self-pay | Admitting: Family Medicine

## 2017-09-24 ENCOUNTER — Telehealth (HOSPITAL_COMMUNITY): Payer: Self-pay

## 2017-09-24 NOTE — Telephone Encounter (Signed)
Pt called stating his therapist Karmen Bongo(Aaron Stewart, [facility]) recommends he increase his Wellbutrin dose. He is currently on 300mg  daily. States his therapist recommends increasing to "450mg -600mg  daily." Pt states he tried calling Dr Fredda HammedAktar but he is out of office for "a week - 10 days."  Pt also questions if PCP decided on writing his Adderall Rx. Will route.

## 2017-09-24 NOTE — Telephone Encounter (Signed)
Patient called stating that his therapist is recommending he increase his Wellbutrin to 400mg  or 450mg . I informed patient that Dr. Gilmore LarocheAkhtar is out of the office until 06/27. Patient stated that he would call his PCP Dr. Linford ArnoldMetheney to see if she would increase it. Patient wanted me to inform Dr. Gilmore LarocheAkhtar that he is increasing Wellbutrin.

## 2017-09-24 NOTE — Telephone Encounter (Signed)
Pt called clinic and left VM stating he has a question about his medication. Did not state which Rx. Attempted callback, no answer. Left VM to return clinic call.

## 2017-09-25 NOTE — Telephone Encounter (Signed)
Pt left msg stating that he spoke to Dr Jens Somrenshaw today and his nurse is going to be sending Dr Linford ArnoldMetheney his chart notes.

## 2017-09-26 NOTE — Telephone Encounter (Signed)
Pt called again because he has not heard from Dr Linford ArnoldMetheney.  I called pt back and asked him specifically what it is he needs. Pt has several needs:  1. Wants increase in Wellbutrin. I advised pt that as long as he has his Wellbutrin now and is not running out, he needs to wait until Dr Fredda HammedAktar was back in the office. Pt understands and is ok with waiting.   2. Patient states that "most importantly": he wants an RX for Adderall. Pt states he talked with Dr Linford ArnoldMetheney about it, he had Crenshaw send notes, and is waiting to heard from Dr Linford ArnoldMetheney. He has not been on Adderall for about a month, he was previously obtaining it with an RX. Wants [facility] to go to [facility].

## 2017-09-29 ENCOUNTER — Other Ambulatory Visit: Payer: Self-pay | Admitting: Family Medicine

## 2017-09-29 DIAGNOSIS — E119 Type 2 diabetes mellitus without complications: Secondary | ICD-10-CM

## 2017-09-29 DIAGNOSIS — F4323 Adjustment disorder with mixed anxiety and depressed mood: Secondary | ICD-10-CM | POA: Diagnosis not present

## 2017-09-29 MED ORDER — AMPHETAMINE-DEXTROAMPHETAMINE 15 MG PO TABS: 15.0000 mg | ORAL_TABLET | Freq: Every day | ORAL | 0 refills | Status: DC

## 2017-09-29 NOTE — Telephone Encounter (Signed)
OK, her scription sent for Adderall.  We will start with 15 mg.  I did go ahead and send for a 90-day supply.  I want him to follow-up in 3 months so we can make sure he is doing well adjust his dose if needed and make sure that his blood pressure is tolerating it okay.  In regards to the Wellbutrin he needs to discuss that with his psychiatrist.

## 2017-09-29 NOTE — Telephone Encounter (Signed)
Left pt msg on ID'd VM advising of Dr Shelah LewandowskyMetheney's note. Told pt to make sure and schedule his 3 month follow up for med management. Call back info provided

## 2017-09-29 NOTE — Telephone Encounter (Signed)
I did get in touch with his therapist.  And he agrees he states that he does feel like his symptoms are consistent with a ADD.  Also got notification from Dr. Jens Somrenshaw that even though it may increase his risk of atrial fibrillation he is okay if it improves quality of life for him to be on a stimulant as long as he is monitored.

## 2017-09-29 NOTE — Addendum Note (Signed)
Addended by: Nani GasserMETHENEY, CATHERINE D on: 09/29/2017 12:41 PM   Modules accepted: Orders

## 2017-09-30 ENCOUNTER — Other Ambulatory Visit: Payer: Self-pay | Admitting: Family Medicine

## 2017-09-30 DIAGNOSIS — E291 Testicular hypofunction: Secondary | ICD-10-CM

## 2017-10-01 ENCOUNTER — Telehealth: Payer: Self-pay

## 2017-10-01 NOTE — Telephone Encounter (Signed)
Please call pharmacy and see what issue is.  We just refilled it.  He can come in for injections if there is a problem, but they have been filling it all year so not sure why suddenly an issue. We can address the Adderall at f/u appt.

## 2017-10-01 NOTE — Telephone Encounter (Signed)
Pt called stating he has been on Adderall for several days now and states he does not feel it is working. He called his therapist and was told he should be feeling that it is working.   Also, pt reports having some issues with his pharmacy and insurance with his testosterone gel and wants to know if for the month of July he can switch to injections in the office? If so, please advise on dose and frequency.  Please advise. Thanks!

## 2017-10-02 MED ORDER — BUPROPION HCL 100 MG PO TABS: 100.0000 mg | ORAL_TABLET | Freq: Every day | ORAL | 1 refills | Status: DC

## 2017-10-02 NOTE — Telephone Encounter (Signed)
Can increase wellbutrin. Let us know he got it from primary care already

## 2017-10-02 NOTE — Telephone Encounter (Signed)
Patient states PCP told him that he would have to speak with Dr. Gilmore LarocheAkhtar to get wellbutrin increased. Sent in wellbutrin 100mg  to add to the 300mg  already taking per Dr. Gilmore LarocheAkhtar. Informed patient.

## 2017-10-02 NOTE — Telephone Encounter (Signed)
Pharmacy is not open yet. Will attempt callback.

## 2017-10-02 NOTE — Telephone Encounter (Signed)
Spoke with pharmacy. Pt has a $500 deductible to meet. Once met, he will go back to the $25/month copay. Called and left this update on Pt's VM. Asked him to contact his insurance and make sure his deductible starting over mid year is correct. If so, and he still wants to proceed with NV injections to contact clinic and be scheduled.

## 2017-10-03 NOTE — Telephone Encounter (Signed)
lvm informing pt of recommendations.Kennetha Pearman [name], CMA  

## 2017-10-07 ENCOUNTER — Encounter: Payer: Self-pay | Admitting: Family Medicine

## 2017-10-07 ENCOUNTER — Ambulatory Visit (INDEPENDENT_AMBULATORY_CARE_PROVIDER_SITE_OTHER): Payer: BLUE CROSS/BLUE SHIELD | Admitting: Family Medicine

## 2017-10-07 VITALS — BP 127/74 | HR 74 | Ht 69.0 in | Wt 355.0 lb

## 2017-10-07 DIAGNOSIS — F988 Other specified behavioral and emotional disorders with onset usually occurring in childhood and adolescence: Secondary | ICD-10-CM

## 2017-10-07 DIAGNOSIS — E291 Testicular hypofunction: Secondary | ICD-10-CM

## 2017-10-07 DIAGNOSIS — Z6841 Body Mass Index (BMI) 40.0 and over, adult: Secondary | ICD-10-CM

## 2017-10-07 MED ORDER — TESTOSTERONE CYPIONATE 200 MG/ML IM SOLN
200.0000 mg | Freq: Once | INTRAMUSCULAR | Status: AC
  Administered 2017-10-07: 200 mg via INTRAMUSCULAR

## 2017-10-07 MED ORDER — METFORMIN HCL 500 MG PO TABS: 500.0000 mg | ORAL_TABLET | Freq: Two times a day (BID) | ORAL | 0 refills | Status: DC

## 2017-10-07 MED ORDER — AMPHETAMINE-DEXTROAMPHETAMINE 30 MG PO TABS
30.0000 mg | ORAL_TABLET | Freq: Two times a day (BID) | ORAL | 0 refills | Status: DC
Start: 2017-10-07 — End: 2017-11-10

## 2017-10-07 NOTE — Patient Instructions (Signed)
Needs 4 week visit with me for ADD

## 2017-10-07 NOTE — Progress Notes (Signed)
Subjective:    Patient ID: Rodney Bright, male    DOB: [birth year], 59 y.o.   MRN: [id number]  HPI   ADD -here today for follow-up ADD.  He is a new start for Adderall.  After checking with cardiology and discussing that there is some increased risk with being on a stimulant but for improvement in quality of life that he wanted to have his ADD treated.  Also is connected with his psychiatrist who also agreed that he would benefit from treatment.  Reports symptoms are not well controlled on current regimen.  He does feel like this helped some but feels like it needs to be increased and adjusted.. Denies any problems with insomnia, chest pain, palpitations, or SOB.    Morbid obesity -  In fact he is also lost a significant amount of weight.  He is been eating a version of the keto diet mixed with Atkins and has been walking and going to the gym regularly.  Hypogonadism-because of his insurance his deductible will be $500 in 6 months to switch to the injectable testosterone temporarily hopefully just for the next couple of months.   Review of Systems  BP 127/74   Pulse 74   Ht 5' 9" (1.753 m)   Wt (!) 355 lb (161 kg)   SpO2 98% Comment: on RA  BMI 52.42 kg/m     Allergies  Allergen Reactions  . Colchicine Shortness Of Breath, Swelling and Itching  . Peach Flavor     "Peaches" " swelling of face"    Past Medical History:  Diagnosis Date  . Anemia   . Diabetes mellitus    controlled  . ED (erectile dysfunction)   . Gout   . Kidney stones    stent, lithotripsy  . Lymphadenopathy   . Morbid obesity ([place])   . OSA (obstructive sleep apnea)    CPAP-17.5 cm water pressure  . Paroxysmal atrial fibrillation ([facility])   . Pneumonia   . S/P emergency tracheotomy for assistance in breathing [facility])    at age 11  . Testosterone deficiency   . Vitamin D deficiency     Past Surgical History:  Procedure Laterality Date  . CHOLECYSTECTOMY    . CYSTOSCOPY     retrograde and double J  catheter insertion.  Marland Kitchen FINGER SURGERY     ulnar digital nerve and artery right index finger  . GASTRIC BYPASS  2005   600 lbs prior to surgery  . TONSILLECTOMY      Social History   Socioeconomic History  . Marital status: Married    Spouse name: Not on file  . Number of children: Not on file  . Years of education: Not on file  . Highest education level: Not on file  Occupational History  . Not on file  Social Needs  . Financial resource strain: Not on file  . Food insecurity:    Worry: Not on file    Inability: Not on file  . Transportation needs:    Medical: Not on file    Non-medical: Not on file  Tobacco Use  . Smoking status: Never Smoker  . Smokeless tobacco: Never Used  Substance and Sexual Activity  . Alcohol use: No  . Drug use: No    Comment: prior cocaine abuse (heavy) in his 30s.  denies subsequent use  . Sexual activity: Yes    Partners: Female    Birth control/protection: None    Comment: on full disability, separated, no  regular exercise,walks some, drinks pot of coffee a day.  Lifestyle  . Physical activity:    Days per week: Not on file    Minutes per session: Not on file  . Stress: Not on file  Relationships  . Social connections:    Talks on phone: Not on file    Gets together: Not on file    Attends religious service: Not on file    Active member of club or organization: Not on file    Attends meetings of clubs or organizations: Not on file    Relationship status: Not on file  . Intimate partner violence:    Fear of current or ex partner: Not on file    Emotionally abused: Not on file    Physically abused: Not on file    Forced sexual activity: Not on file  Other Topics Concern  . Not on file  Social History Narrative   Drinks one pot of coffee daily.   Regular exercise-no, walks some      Lives in [place] with roommate.   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    Family History  Problem Relation Age  of Onset  . Cancer Mother        lung, heavy smoker  . Hypertension Mother   . Hyperlipidemia Mother   . Cancer Father        melanoma  . Aneurysm Father        cardiac  . Hyperlipidemia Father   . Hypertension Father   . Hyperlipidemia Sister   . Hypertension Sister   . Hyperlipidemia Brother   . Hypertension Brother   . Stroke Other     Outpatient Encounter Medications as of 10/07/2017  Medication Sig  . alfuzosin (UROXATRAL) 10 MG 24 hr tablet Take 10 mg by mouth daily.  Marland Kitchen allopurinol (ZYLOPRIM) 100 MG tablet Take 1 tablet (100 mg total) by mouth daily.  . AMBULATORY NON FORMULARY MEDICATION Medication Name: CPAP machine with nasal mask and supplies set to 17.5 cm water pressure.  No humidifier.  Sent to [facility].  See sleep study from 2009 attached.  Dx: OSA.  Marland Kitchen AMBULATORY NON FORMULARY MEDICATION Medication Name: Cyclobenzaprine 2% gabapentin 3% Baclofen 2%  Diclofenac 3% Qty# 120 ml Sig: apply small amount topically to effected area 2-3 times a day as directed Fax to [phone]  . AMBULATORY NON FORMULARY MEDICATION Medication Name: Needs CPAP tubing, supplies. Dx OSA.  09/05/17.  Marland Kitchen amiodarone (PACERONE) 200 MG tablet Take 200 mg by mouth daily.  Marland Kitchen aspirin EC 81 MG tablet Take 1 tablet (81 mg total) by mouth daily.  . blood glucose meter kit and supplies KIT Dispense based on patient and insurance preference. Use up to four times daily as directed. (FOR ICD-9 250.00, 250.01).  Marland Kitchen buPROPion (WELLBUTRIN XL) 300 MG 24 hr tablet Take 1 tablet (300 mg total) by mouth daily.  Marland Kitchen buPROPion (WELLBUTRIN) 100 MG tablet Take 1 tablet (100 mg total) by mouth daily. Add to Wellbutrin 34m already taking.  . diltiazem (CARDIZEM CD) 240 MG 24 hr capsule Take 1 capsule (240 mg total) by mouth daily.  .Marland Kitchenescitalopram (LEXAPRO) 20 MG tablet Take 253mtablets twice a day.  . lisinopril (PRINIVIL,ZESTRIL) 5 MG tablet Take 1 tablet (5 mg total) by mouth daily.  . Marland KitchenORazepam (ATIVAN) 1 MG tablet TAKE 1  TABLET BY MOUTH EVERY 8 HOURS only as needed  . metFORMIN (GLUCOPHAGE) 500 MG tablet Take 1 tablet (  500 mg total) by mouth 2 (two) times daily with a meal.  . NARCAN 4 MG/0.1ML LIQD nasal spray kit Inject solution in to one nostril for suspected overdose. Repeat in 3-5 minutes if necessary in the other nostril. CALL 911  . oxyCODONE (ROXICODONE) 15 MG immediate release tablet   . pravastatin (PRAVACHOL) 40 MG tablet Take 1 tablet (40 mg total) by mouth daily.  . Testosterone 20.25 MG/ACT (1.62%) GEL place 4 pumps on the skin every morning  . tiZANidine (ZANAFLEX) 4 MG tablet Take 4 mg by mouth once.   Marland Kitchen VICTOZA 18 MG/3ML SOPN Inject 0.3 mLs (1.8 mg total) into the skin daily. (54/1.8=30)  . zolpidem (AMBIEN) 10 MG tablet Take 10 mg by mouth at bedtime.  . [DISCONTINUED] amphetamine-dextroamphetamine (ADDERALL) 15 MG tablet Take 1 tablet by mouth daily before breakfast.  . [DISCONTINUED] amphetamine-dextroamphetamine (ADDERALL) 15 MG tablet Take 1 tablet by mouth daily before breakfast.  . [DISCONTINUED] amphetamine-dextroamphetamine (ADDERALL) 15 MG tablet Take 1 tablet by mouth daily before breakfast.  . [DISCONTINUED] metFORMIN (GLUCOPHAGE) 500 MG tablet Take 1 tablet (500 mg total) by mouth 2 (two) times daily with a meal.  . amphetamine-dextroamphetamine (ADDERALL) 30 MG tablet Take 1 tablet by mouth 2 (two) times daily. 2nd dose after lunch  . HYDROcodone-acetaminophen (NORCO) 7.5-325 MG tablet Take 1 tablet by mouth every 8 (eight) hours as needed.  . [DISCONTINUED] HYDROcodone-acetaminophen (NORCO) 10-325 MG tablet Take 1 tablet by mouth every 8 (eight) hours as needed.  . [EXPIRED] testosterone cypionate (DEPOTESTOSTERONE CYPIONATE) injection 200 mg    No facility-administered encounter medications on file as of 10/07/2017.          Objective:   Physical Exam  Constitutional: He is oriented to person, place, and time. He appears well-developed and well-nourished.  HENT:  Head:  Normocephalic and atraumatic.  Cardiovascular: Normal rate, regular rhythm and normal heart sounds.  Pulmonary/Chest: Effort normal and breath sounds normal.  Neurological: He is alert and oriented to person, place, and time.  Skin: Skin is warm and dry.  Psychiatric: He has a normal mood and affect. His behavior is normal.          Assessment & Plan:  ADD -we will increase Adderall to 30 mg.  Monitor for chest pain, shortness of breath and increased blood pressure.  Blood pressure looks great today on the 15 mg.  We will also increase dose to twice daily.  Make sure is taking second dose after lunch not late in the evening.  Will need follow-up in 1 month to recheck blood pressure.  Hypogonadism-given 200 mg IM testosterone today.  Follow-up in 2 weeks for nurse visit for second injection.  Recent PSA test in January was normal.  Morbid obesity/BMI 52/Class 3 -he is doing absolutely fantastic.  Fact he has lost almost 20 pounds since I last saw him and is doing fantastic with his diet and exercise.

## 2017-10-13 ENCOUNTER — Other Ambulatory Visit: Payer: Self-pay | Admitting: *Deleted

## 2017-10-13 MED ORDER — PRAVASTATIN SODIUM 40 MG PO TABS: 40.0000 mg | ORAL_TABLET | Freq: Every day | ORAL | 0 refills | Status: DC

## 2017-10-13 MED ORDER — DILTIAZEM HCL ER COATED BEADS 240 MG PO CP24: 240.0000 mg | ORAL_CAPSULE | Freq: Every day | ORAL | 0 refills | Status: DC

## 2017-10-13 MED ORDER — LISINOPRIL 5 MG PO TABS: 5.0000 mg | ORAL_TABLET | Freq: Every day | ORAL | 0 refills | Status: DC

## 2017-10-21 ENCOUNTER — Ambulatory Visit: Payer: Self-pay

## 2017-10-22 ENCOUNTER — Ambulatory Visit (INDEPENDENT_AMBULATORY_CARE_PROVIDER_SITE_OTHER): Payer: Self-pay | Admitting: Family Medicine

## 2017-10-22 VITALS — BP 102/71 | HR 77 | Wt 353.0 lb

## 2017-10-22 DIAGNOSIS — E291 Testicular hypofunction: Secondary | ICD-10-CM

## 2017-10-22 MED ORDER — TESTOSTERONE CYPIONATE 200 MG/ML IM SOLN
200.0000 mg | Freq: Once | INTRAMUSCULAR | Status: AC
  Administered 2017-10-22: 200 mg via INTRAMUSCULAR

## 2017-10-22 NOTE — Progress Notes (Signed)
   Subjective:    Patient ID: Rodney Bright, male    DOB: 11/22/1960, 57 y.o.   MRN: [id number]  HPI  Rodney Bright is here for a testosterone injection. Denies chest pain, shortness of breath, headaches or mood changes.   Review of Systems     Objective:   Physical Exam        Assessment & Plan:  Patient tolerated injection well without complications. LUOQ. Patient advised to schedule next injection 14 days from today.

## 2017-10-22 NOTE — Progress Notes (Signed)
Agree with documentation as above.   Jalasia Eskridge, MD  

## 2017-10-29 ENCOUNTER — Other Ambulatory Visit (HOSPITAL_COMMUNITY): Payer: Self-pay

## 2017-10-29 DIAGNOSIS — F411 Generalized anxiety disorder: Secondary | ICD-10-CM

## 2017-10-29 DIAGNOSIS — F331 Major depressive disorder, recurrent, moderate: Secondary | ICD-10-CM

## 2017-10-29 MED ORDER — LORAZEPAM 1 MG PO TABS: ORAL_TABLET | ORAL | 0 refills | Status: DC

## 2017-11-05 ENCOUNTER — Telehealth (HOSPITAL_COMMUNITY): Payer: Self-pay

## 2017-11-05 NOTE — Telephone Encounter (Signed)
Left VM for Brooke at [facility] informing her that per Dr. Gilmore LarocheAkhtar patient can cut lorazepam in half in next 7 days and even stop it in 14 days. I informed her that they have to be the ones to let patient know.

## 2017-11-05 NOTE — Telephone Encounter (Signed)
Brooke from [facility] called stating that the provider the patient sees there wants to know if Dr. Gilmore LarocheAkhtar will be willing to reduce the dosage on patients' lorazepam because he is currently taking low doses of Oxycodone and Norco and the provider there does not want to go any lower on the dosages that he is on for his pain. Please review and advise.

## 2017-11-05 NOTE — Telephone Encounter (Signed)
I am always ready to reduce dose. Patient resist . We can cut dose down to half in next 7 days and even stop it in 14 days. They need to let the patient know that this will happen or if it is contingent to be on pain meds

## 2017-11-06 ENCOUNTER — Ambulatory Visit: Payer: Self-pay

## 2017-11-07 ENCOUNTER — Telehealth: Payer: Self-pay

## 2017-11-07 ENCOUNTER — Ambulatory Visit (INDEPENDENT_AMBULATORY_CARE_PROVIDER_SITE_OTHER): Payer: Self-pay | Admitting: Physician Assistant

## 2017-11-07 ENCOUNTER — Other Ambulatory Visit: Payer: Self-pay | Admitting: Family Medicine

## 2017-11-07 ENCOUNTER — Telehealth: Payer: Self-pay | Admitting: Physician Assistant

## 2017-11-07 VITALS — BP 124/74 | HR 88 | Temp 97.8°F | Wt 341.0 lb

## 2017-11-07 DIAGNOSIS — E291 Testicular hypofunction: Secondary | ICD-10-CM

## 2017-11-07 MED ORDER — TESTOSTERONE CYPIONATE 200 MG/ML IM SOLN
200.0000 mg | Freq: Once | INTRAMUSCULAR | Status: AC
  Administered 2017-11-07: 200 mg via INTRAMUSCULAR

## 2017-11-07 NOTE — Progress Notes (Signed)
   Subjective:    Patient ID: Rodney Bright, male    DOB: 01/23/1961, 57 y.o.   MRN: [id number]  HPI Patient is here for a testosterone injection. Denies chest pain, shortness of breath, headaches, problems with mood change, or medication problems.    Review of Systems     Objective:   Physical Exam        Assessment & Plan:  Patient tolerated injection in ROUQ well without complications. Patient advised to schedule his next injection for 2 weeks from today.  Agree with above plan. Tandy GawJade Breeback Pa-c

## 2017-11-07 NOTE — Telephone Encounter (Signed)
Received fax from [employer] that Testosterone requires a PA. Information has been sent to the insurance company. Awaiting determination.   

## 2017-11-07 NOTE — Telephone Encounter (Signed)
Pt states he needs refill on his Adderall. Last RX written 10-07-17 fir 1 BID #60 RX pended, please send if appropriate.   Pt also wanted to let Dr Linford ArnoldMetheney know he has only been taking 1/2 tab of his lisinopril instead of one whole tab. His BP reading at his last nurse visit was 124/74 and he has lost 14 pounds since 10-07-17. FYI to PCP

## 2017-11-10 ENCOUNTER — Other Ambulatory Visit: Payer: Self-pay | Admitting: Family Medicine

## 2017-11-10 DIAGNOSIS — F4323 Adjustment disorder with mixed anxiety and depressed mood: Secondary | ICD-10-CM | POA: Diagnosis not present

## 2017-11-10 MED ORDER — AMPHETAMINE-DEXTROAMPHETAMINE 30 MG PO TABS: 30.0000 mg | ORAL_TABLET | Freq: Two times a day (BID) | ORAL | 0 refills | Status: DC

## 2017-11-10 NOTE — Telephone Encounter (Signed)
Spoke with PCP. Will send over refill until scheduled. Pended Rx and sent to PCP for signature.

## 2017-11-10 NOTE — Telephone Encounter (Signed)
Please see last office visit note with me .  He is supposed to have a follow-up appointment as he is a new start for Adderall.

## 2017-11-10 NOTE — Telephone Encounter (Signed)
I have sooner appts. Does he need a certain time slot?  I am not filling meds until the end of Sept when he was supposed to follow up a month ago ( before I went on vacation).

## 2017-11-10 NOTE — Telephone Encounter (Signed)
Pt advised. Transferred to scheduler.  

## 2017-11-10 NOTE — Telephone Encounter (Signed)
OK Rx sent for this mont and for early Sept

## 2017-11-10 NOTE — Telephone Encounter (Signed)
Pt scheduled for appt on 01/02/18 (first available), requesting refill to last until.

## 2017-11-10 NOTE — Addendum Note (Signed)
Addended by: Collie SiadICHARDSON, Kmari Brian M on: 11/10/2017 04:53 PM   Modules accepted: Orders

## 2017-11-10 NOTE — Addendum Note (Signed)
Addended by: Nani GasserMETHENEY, Audwin Semper D on: 11/10/2017 04:59 PM   Modules accepted: Orders

## 2017-11-11 NOTE — Telephone Encounter (Signed)
[facility] that Testosterone Gel has been approved from 11/07/2017 through 04/07/2038. Pharmacy notified and form sent to scan.

## 2017-11-17 DIAGNOSIS — F4323 Adjustment disorder with mixed anxiety and depressed mood: Secondary | ICD-10-CM | POA: Diagnosis not present

## 2017-11-21 ENCOUNTER — Ambulatory Visit (INDEPENDENT_AMBULATORY_CARE_PROVIDER_SITE_OTHER): Payer: BLUE CROSS/BLUE SHIELD | Admitting: Family Medicine

## 2017-11-21 ENCOUNTER — Other Ambulatory Visit (HOSPITAL_COMMUNITY): Payer: Self-pay

## 2017-11-21 ENCOUNTER — Telehealth (HOSPITAL_COMMUNITY): Payer: Self-pay | Admitting: Psychiatry

## 2017-11-21 VITALS — BP 122/68 | HR 69 | Temp 98.0°F | Wt 342.0 lb

## 2017-11-21 DIAGNOSIS — E291 Testicular hypofunction: Secondary | ICD-10-CM | POA: Diagnosis not present

## 2017-11-21 DIAGNOSIS — Z23 Encounter for immunization: Secondary | ICD-10-CM

## 2017-11-21 MED ORDER — BUPROPION HCL ER (XL) 300 MG PO TB24: 300.0000 mg | ORAL_TABLET | Freq: Every day | ORAL | 0 refills | Status: DC

## 2017-11-21 MED ORDER — BUPROPION HCL 100 MG PO TABS: 100.0000 mg | ORAL_TABLET | Freq: Every day | ORAL | 0 refills | Status: DC

## 2017-11-21 MED ORDER — TESTOSTERONE CYPIONATE 200 MG/ML IM SOLN
200.0000 mg | Freq: Once | INTRAMUSCULAR | Status: AC
  Administered 2017-11-21: 200 mg via INTRAMUSCULAR

## 2017-11-21 NOTE — Progress Notes (Signed)
Agree with documentation as above.   Edwing Figley, MD  

## 2017-11-21 NOTE — Progress Notes (Signed)
Patient came into office today for testosterone injection. Denies chest pain, shortness of breath, headaches and problems associated with taking this medication. Patient states he has had no abnornal mood swings. Patient tolerated injection in LUOQ well without complications. Patient states he is switching to testosterone gel, so he will not need future testosterone injection appts. Patient also wanted his flu vaccination. Patient was given a flu shot questionnaire prior to administration of immunization. All questions were answered no. Patient tolerated injection of flu immunization in left deltoid well, with no immediate complications. Advised to contact our office with any questions/concerns.   Printed reminder of next appt with PCP provided prior to leaving clinic.

## 2017-11-21 NOTE — Telephone Encounter (Signed)
We sent refill for wellbutrin 300mg , pt also needs 100mg  [facility].

## 2017-11-24 DIAGNOSIS — F4323 Adjustment disorder with mixed anxiety and depressed mood: Secondary | ICD-10-CM | POA: Diagnosis not present

## 2017-11-27 ENCOUNTER — Encounter: Payer: Self-pay | Admitting: Family Medicine

## 2017-11-27 ENCOUNTER — Ambulatory Visit (INDEPENDENT_AMBULATORY_CARE_PROVIDER_SITE_OTHER): Payer: BLUE CROSS/BLUE SHIELD | Admitting: Family Medicine

## 2017-11-27 VITALS — BP 116/81 | HR 76 | Ht 69.0 in | Wt 337.0 lb

## 2017-11-27 DIAGNOSIS — I10 Essential (primary) hypertension: Secondary | ICD-10-CM

## 2017-11-27 DIAGNOSIS — Z6841 Body Mass Index (BMI) 40.0 and over, adult: Secondary | ICD-10-CM

## 2017-11-27 DIAGNOSIS — F988 Other specified behavioral and emotional disorders with onset usually occurring in childhood and adolescence: Secondary | ICD-10-CM | POA: Diagnosis not present

## 2017-11-27 DIAGNOSIS — L089 Local infection of the skin and subcutaneous tissue, unspecified: Secondary | ICD-10-CM

## 2017-11-27 DIAGNOSIS — E118 Type 2 diabetes mellitus with unspecified complications: Secondary | ICD-10-CM | POA: Diagnosis not present

## 2017-11-27 DIAGNOSIS — I480 Paroxysmal atrial fibrillation: Secondary | ICD-10-CM

## 2017-11-27 LAB — POCT GLYCOSYLATED HEMOGLOBIN (HGB A1C): Hemoglobin A1C: 5.6 % (ref 4.0–5.6)

## 2017-11-27 LAB — GLUCOSE, POCT (MANUAL RESULT ENTRY): POC Glucose: 121 mg/dl — AB (ref 70–99)

## 2017-11-27 NOTE — Progress Notes (Signed)
Subjective:    CC: ADD  HPI:  ADD -she doing really well on the Adderall 30 mg twice a day.  Is not had experienced any side effect such as chest pain shortness breath palpitations or insomnia.  He tries not to take his last dose past 4:00 in the afternoon.  He says that he is noticed a big difference as well as his psychiatrist has noticed a big difference.  Diabetes - no hypoglycemic events. No wounds or sores that are not healing well. No increased thirst or urination. Checking glucose at home. Taking medications as prescribed without any side effects.  Morbid obesity-he is actually doing really well and continues to lose weight.  He is currently on a keto diet but feels like he is starting to plateau and more recently has had a difficult time continuing to lose weight.  He is eating around 1600 cal a day though the diet says he should be eating around 2200 cal a day.  Is losing weight he continues to have problems with his abdominal pannus that folds over the lower half of his body.  He is continuing to have problems with urination because of it as well as constantly battling rash and inflammation.  Around the penis area.  Past medical history, Surgical history, Family history not pertinant except as noted below, Social history, Allergies, and medications have been entered into the medical record, reviewed, and corrections made.   Review of Systems: No fevers, chills, night sweats, weight loss, chest pain, or shortness of breath.   Objective:    General: Well Developed, well nourished, and in no acute distress.  Neuro: Alert and oriented x3, extra-ocular muscles intact, sensation grossly intact.  HEENT: Normocephalic, atraumatic  Skin: Warm and dry, no rashes.  Does have a large amount of redundant skin on his lower abdomen hanging over the pelvis area.  He does have some erythema and increased moisture underneath. Cardiac: Regular rate and rhythm, no murmurs rubs or gallops, no lower  extremity edema.  Respiratory: Clear to auscultation bilaterally. Not using accessory muscles, speaking in full sentences.   Impression and Recommendations:    ADD -to new current regimen doing great for right now.  Follow-up in about 4 months.  We will send refills to the pharmacy.   DM - Well controlled.  A1c down to 5.6 which is absolutely fantastic in great progress.  In fact he continues to do well at his next follow-up we can consider discontinuing metformin and may be keeping the Victoza since that is probably helping him with the weight loss.  Continue current regimen. Follow up in  4 months.    Atrial fibrillation-did encourage him to keep follow-up with cardiology he says he has an appointment coming up in the next month or 2.  Little hesitant to stop his ACE inhibitor but he could probably benefit from reduction in his diltiazem as his blood pressures are starting to run lower.  Morbid obesity-BMI of 49 he really has made great progress overall.  Continue with current regimen will be happy to refer him to nutritionist he would like.  I suspect he is probably under eating a little bit based on his current weight so okay to increase caloric intake.  I will also refer him to plastic surgeon for evaluation for possible panniculectomy.  Because of the side effects of this I do think he needs to have the surgery and I do feel like it is medically necessary because of difficulty with urination  because of pressure on the penis as well as recurrent yeast infections and rash.  HTN -he is actually on the low end today.  He started cutting his lisinopril in half.  There is some data looking at ACE inhibitors in people who have A. fib so feel more comfortable with Dr. Jens Som making the decision about either discontinuing the lisinopril or keeping it in decreasing the Cardizem.  Patient discussed with Dr. Jens Som at his follow-up appointment.

## 2017-12-01 DIAGNOSIS — F4323 Adjustment disorder with mixed anxiety and depressed mood: Secondary | ICD-10-CM | POA: Diagnosis not present

## 2017-12-02 ENCOUNTER — Ambulatory Visit (HOSPITAL_COMMUNITY): Payer: BLUE CROSS/BLUE SHIELD | Admitting: Psychiatry

## 2017-12-03 ENCOUNTER — Other Ambulatory Visit: Payer: Self-pay | Admitting: Family Medicine

## 2017-12-04 ENCOUNTER — Ambulatory Visit: Payer: BLUE CROSS/BLUE SHIELD

## 2017-12-04 ENCOUNTER — Other Ambulatory Visit: Payer: Self-pay

## 2017-12-04 DIAGNOSIS — E291 Testicular hypofunction: Secondary | ICD-10-CM

## 2017-12-04 NOTE — Progress Notes (Signed)
Ordered testosterone lab

## 2017-12-08 DIAGNOSIS — F4323 Adjustment disorder with mixed anxiety and depressed mood: Secondary | ICD-10-CM | POA: Diagnosis not present

## 2017-12-11 ENCOUNTER — Encounter (HOSPITAL_COMMUNITY): Payer: Self-pay | Admitting: Psychiatry

## 2017-12-11 ENCOUNTER — Ambulatory Visit (INDEPENDENT_AMBULATORY_CARE_PROVIDER_SITE_OTHER): Payer: BLUE CROSS/BLUE SHIELD | Admitting: Psychiatry

## 2017-12-11 VITALS — BP 126/82 | HR 80 | Ht 69.0 in | Wt 329.8 lb

## 2017-12-11 DIAGNOSIS — F900 Attention-deficit hyperactivity disorder, predominantly inattentive type: Secondary | ICD-10-CM

## 2017-12-11 DIAGNOSIS — F411 Generalized anxiety disorder: Secondary | ICD-10-CM

## 2017-12-11 DIAGNOSIS — F5102 Adjustment insomnia: Secondary | ICD-10-CM

## 2017-12-11 DIAGNOSIS — F331 Major depressive disorder, recurrent, moderate: Secondary | ICD-10-CM

## 2017-12-11 MED ORDER — ESCITALOPRAM OXALATE 20 MG PO TABS: ORAL_TABLET | ORAL | 2 refills | Status: DC

## 2017-12-11 MED ORDER — BUPROPION HCL 100 MG PO TABS: 100.0000 mg | ORAL_TABLET | Freq: Every day | ORAL | 2 refills | Status: DC

## 2017-12-11 MED ORDER — BUPROPION HCL ER (XL) 300 MG PO TB24: 300.0000 mg | ORAL_TABLET | Freq: Every day | ORAL | 2 refills | Status: DC

## 2017-12-11 MED ORDER — LORAZEPAM 1 MG PO TABS: ORAL_TABLET | ORAL | 2 refills | Status: DC

## 2017-12-11 NOTE — Progress Notes (Signed)
Patient ID: BRENON ANTOSH, male   DOB: 1961/01/18, 57 y.o.   MRN: [id number]   [facility] Follow-up Outpatient Visit  TYLIN FORCE October 06, 1960  Date: 09/052019  HPI Comments: Mr. Molyneux is a 57y/o male with a past psychiatric history significant for symptoms of depression and anxiety. The patient was referred for psychiatric services for medication management.    Doing fair. Referred to [place] attention specialist, but he followed upstairs with Dr. Madilyn Fireman and is now on adderall '30mg'$  bid.  Cautioned about high dose and its effect.  Says it is helping and he understands we will not be prescribing this medication.  On testosterone which may have its contribution to energy level as well. Also doing keto diet and working on weight loss Depression is fair. wellbutrin '100mg'$  was added to '300mg'$ .  Goes to gym  Going thru therapy . Building esteem    Ativan helps with anxiety not willing to cut down   Insomnia :not worse. On cpap machine  Depression severity :improved Motivation: better    Review of Systems  Constitutional: Negative for fever.  Cardiovascular: Negative for chest pain.  Gastrointestinal: Negative for nausea.  Skin: Negative for rash.  Neurological: Negative for tremors.  Psychiatric/Behavioral: Negative for depression, hallucinations and suicidal ideas.     Vitals:   12/11/17 0836  BP: 126/82  Pulse: 80  SpO2: 94%  Weight: (!) 329 lb 12.8 oz (149.6 kg)  Height: '5\' 9"'$  (1.753 m)    Physical Exam  Constitutional: No distress.  Obese  Skin: He is not diaphoretic.    Past Medical History: Reviewed  Past Medical History:  Diagnosis Date  . Anemia   . Diabetes mellitus    controlled  . ED (erectile dysfunction)   . Gout   . Kidney stones    stent, lithotripsy  . Lymphadenopathy   . Morbid obesity ([place])   . OSA (obstructive sleep apnea)    CPAP-17.5 cm water pressure  . Paroxysmal atrial fibrillation (HCC)   . Pneumonia   . S/P  emergency tracheotomy for assistance in breathing [facility])    at age 10  . Testosterone deficiency   . Vitamin D deficiency      Allergies:  Allergies  Allergen Reactions  . Colchicine Shortness Of Breath, Swelling and Itching  . Peach Flavor     "Peaches" " swelling of face"     Current Medications: Current Outpatient Medications on File Prior to Visit  Medication Sig Dispense Refill  . alfuzosin (UROXATRAL) 10 MG 24 hr tablet Take 10 mg by mouth daily.  10  . allopurinol (ZYLOPRIM) 100 MG tablet Take 1 tablet (100 mg total) by mouth daily. 90 tablet 1  . AMBULATORY NON FORMULARY MEDICATION Medication Name: CPAP machine with nasal mask and supplies set to 17.5 cm water pressure.  No humidifier.  Sent to [facility].  See sleep study from 2009 attached.  Dx: OSA. 1 Units PRN.  Marland Kitchen AMBULATORY NON FORMULARY MEDICATION Medication Name: Cyclobenzaprine 2% gabapentin 3% Baclofen 2%  Diclofenac 3% Qty# 120 ml Sig: apply small amount topically to effected area 2-3 times a day as directed Fax to [phone] 120 mL 2  . AMBULATORY NON FORMULARY MEDICATION Medication Name: Needs CPAP tubing, supplies. Dx OSA.  09/05/17. 1 vial 0  . amiodarone (PACERONE) 200 MG tablet Take 200 mg by mouth daily.  11  . amphetamine-dextroamphetamine (ADDERALL) 30 MG tablet Take 1 tablet by mouth 2 (two) times daily. 2nd dose after lunch 60 tablet  0  . amphetamine-dextroamphetamine (ADDERALL) 30 MG tablet Take 1 tablet by mouth 2 (two) times daily. 60 tablet 0  . aspirin EC 81 MG tablet Take 1 tablet (81 mg total) by mouth daily. 90 tablet 3  . blood glucose meter kit and supplies KIT Dispense based on patient and insurance preference. Use up to four times daily as directed. (FOR ICD-9 250.00, 250.01). 1 each 0  . diltiazem (CARDIZEM CD) 240 MG 24 hr capsule Take 1 capsule (240 mg total) by mouth daily. KEEP OV. 90 capsule 0  . EASY TALK BLOOD GLUCOSE TEST test strip use to test blood sugar once daily 100 each 1  .  HYDROcodone-acetaminophen (NORCO) 7.5-325 MG tablet Take 1 tablet by mouth every 8 (eight) hours as needed.  0  . lisinopril (PRINIVIL,ZESTRIL) 5 MG tablet Take 1 tablet (5 mg total) by mouth daily. KEEP OV. 90 tablet 0  . metFORMIN (GLUCOPHAGE) 500 MG tablet Take 1 tablet (500 mg total) by mouth 2 (two) times daily with a meal. 180 tablet 0  . NARCAN 4 MG/0.1ML LIQD nasal spray kit Inject solution in to one nostril for suspected overdose. Repeat in 3-5 minutes if necessary in the other nostril. CALL 911  0  . oxyCODONE (ROXICODONE) 15 MG immediate release tablet   0  . pravastatin (PRAVACHOL) 40 MG tablet Take 1 tablet (40 mg total) by mouth daily. KEEP OV. 90 tablet 0  . Testosterone 20.25 MG/ACT (1.62%) GEL place 4 pumps on the skin every morning 150 g 2  . tiZANidine (ZANAFLEX) 4 MG tablet Take 4 mg by mouth once.     Marland Kitchen VICTOZA 18 MG/3ML SOPN Inject 0.3 mLs (1.8 mg total) into the skin daily. (54/1.8=30) 9 mL 2  . zolpidem (AMBIEN) 10 MG tablet Take 10 mg by mouth at bedtime.  2   No current facility-administered medications on file prior to visit.         Social History   Socioeconomic History  . Marital status: Married    Spouse name: Not on file  . Number of children: Not on file  . Years of education: Not on file  . Highest education level: Not on file  Occupational History  . Not on file  Social Needs  . Financial resource strain: Not on file  . Food insecurity:    Worry: Not on file    Inability: Not on file  . Transportation needs:    Medical: Not on file    Non-medical: Not on file  Tobacco Use  . Smoking status: Never Smoker  . Smokeless tobacco: Never Used  Substance and Sexual Activity  . Alcohol use: No  . Drug use: No    Comment: prior cocaine abuse (heavy) in his 72s.  denies subsequent use  . Sexual activity: Yes    Partners: Female    Birth control/protection: None    Comment: on full disability, separated, no regular exercise,walks some, drinks pot  of coffee a day.  Lifestyle  . Physical activity:    Days per week: Not on file    Minutes per session: Not on file  . Stress: Not on file  Relationships  . Social connections:    Talks on phone: Not on file    Gets together: Not on file    Attends religious service: Not on file    Active member of club or organization: Not on file    Attends meetings of clubs or organizations: Not on file  Relationship status: Not on file  Other Topics Concern  . Not on file  Social History Narrative   Drinks one pot of coffee daily.   Regular exercise-no, walks some      Lives in [place] with roommate.   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    Caffeine: Caffeinated Beverages 1 5 hour energy   Family History: Reviewed  Family History  Problem Relation Age of Onset  . Cancer Mother        lung, heavy smoker  . Hypertension Mother   . Hyperlipidemia Mother   . Cancer Father        melanoma  . Aneurysm Father        cardiac  . Hyperlipidemia Father   . Hypertension Father   . Hyperlipidemia Sister   . Hypertension Sister   . Hyperlipidemia Brother   . Hypertension Brother   . Stroke Other     Psychiatric Specialty Examination: Objective: Appearance: Casual   Eye Contact:: Good   Speech: Clear and Coherent and Normal Rate   Volume: Normal   Mood: fair  Affect:reactive  Thought Process: Coherent, Linear and Logical   Orientation: Full   Thought Content: WDL   Suicidal Thoughts: No   Homicidal Thoughts: No   Judgement: Fair   Insight: Good   Psychomotor Activity: Normal   Akathisia: Yes   Handed: Right   Memory-immediate 3/3; recent-2/3  Fund of knowledge-average to above average  Language-Intact  AIMS (if indicated): None   Assets: Communication Skills  Desire for Improvement  Financial Resources/Insurance  Physical Health    Laboratory/X-Ray  Psychological Evaluation(s)   none  Not available    Assessment:  AXIS I  Major  Depression, Recurrent . Rule out ADHD , Polysustance dependence in remission. GAD. insomnia  AXIS II  No diagnosis   AXIS III  Past Medical History    Diagnosis  Date    .  Atrial fibrillation     .  Lymphadenopathy     .  Testosterone deficiency     .  Anemia     .  Vitamin d deficiency     .  ED (erectile dysfunction)     .  Morbid obesity     .  OSA (obstructive sleep apnea)       CPAP-17.5 cm water pressure    .  Kidney stones       stent, lithotripsy    .  Lymphadenopathy     .  Diabetes mellitus       controlled    .  Testosterone deficiency     .  Excess or deficiency of vitamin D     .  ED (erectile dysfunction)       AXIS IV  economic problems, educational problems, occupational problems and problems with primary support group   AXIS V  60-moderate symptoms    Treatment Plan/Recommendations:  PLAN:   1. Depression: better. Continue wellbutrin 300 plus 100. Continue lexapro. Refills sent    2. Anxiety: not worse. Continue lexapro, ativan. Cautioned and to skip dose of ativan at times to avoid tolerance 3. ADHD : not distracted. On wellbutrin, continue  from [place]. On adderall from primary care Refills sent or printed   Merian Capron, M.D.  12/11/2017 8:46 AM

## 2017-12-18 DIAGNOSIS — G603 Idiopathic progressive neuropathy: Secondary | ICD-10-CM | POA: Diagnosis not present

## 2017-12-18 DIAGNOSIS — R270 Ataxia, unspecified: Secondary | ICD-10-CM | POA: Diagnosis not present

## 2017-12-18 DIAGNOSIS — M5417 Radiculopathy, lumbosacral region: Secondary | ICD-10-CM | POA: Diagnosis not present

## 2017-12-18 DIAGNOSIS — R202 Paresthesia of skin: Secondary | ICD-10-CM | POA: Diagnosis not present

## 2017-12-18 DIAGNOSIS — G4701 Insomnia due to medical condition: Secondary | ICD-10-CM | POA: Diagnosis not present

## 2017-12-18 DIAGNOSIS — G4733 Obstructive sleep apnea (adult) (pediatric): Secondary | ICD-10-CM | POA: Diagnosis not present

## 2017-12-22 DIAGNOSIS — F4323 Adjustment disorder with mixed anxiety and depressed mood: Secondary | ICD-10-CM | POA: Diagnosis not present

## 2017-12-23 NOTE — Progress Notes (Signed)
HPI: FU atrial fibrillation. Echocardiogram in March of 2014 showed an ejection fraction of 40-45%, mild left atrial enlargement and grade 2 diastolic dysfunction. Study was technically difficult. Nuclear study in April of 2014 at [facility] showed an ejection fraction greater than 65% and normal perfusion. Patient had an outpatient monitor for recurrent palpitations which revealed wide-complex tachycardia. The patient was seen by Dr. Rayann Heman and this was felt to be atrial flutter with one-to-one conduction. He was started on flecainide. Followup exercise treadmill showed no induced ventricular arrhythmias but there was a rate related left bundle branch block. Flecanide changed to amiodarone previously at [facility]. Patient felt not to be a candidate for ablation due to size. Hospitalized at [facility] 8/15 with GI bleed and anticoagulation DCed. Patient did have a colonoscopy which revealed pandiverticulosis. He also was transfused 8 units of packed red blood cells. Discharge summary states anticoagulation can be resumed in one week if hemoglobin stable. Patient decided to continue aspirin and avoid Coumadin. Nuclear study March 2018 showed ejection fraction 34% and ischemia in the inferior, inferolateral and apical inferior walls.   At previous office visit we discussed options and elected conservative therapy.  It was felt that the inferior defect could certainly be related to his size.  Echocardiogram May 2018 showed normal LV function.  Since I last saw him, the patient denies any dyspnea on exertion, orthopnea, PND, pedal edema, palpitations, syncope or chest pain.   Current Outpatient Medications  Medication Sig Dispense Refill  . alfuzosin (UROXATRAL) 10 MG 24 hr tablet Take 10 mg by mouth daily.  10  . allopurinol (ZYLOPRIM) 100 MG tablet Take 1 tablet (100 mg total) by mouth daily. 90 tablet 1  . AMBULATORY NON FORMULARY MEDICATION Medication Name: CPAP machine with nasal mask and supplies set  to 17.5 cm water pressure.  No humidifier.  Sent to [facility].  See sleep study from 2009 attached.  Dx: OSA. 1 Units PRN.  Marland Kitchen AMBULATORY NON FORMULARY MEDICATION Medication Name: Cyclobenzaprine 2% gabapentin 3% Baclofen 2%  Diclofenac 3% Qty# 120 ml Sig: apply small amount topically to effected area 2-3 times a day as directed Fax to [phone] 120 mL 2  . AMBULATORY NON FORMULARY MEDICATION Medication Name: Needs CPAP tubing, supplies. Dx OSA.  09/05/17. 1 vial 0  . amiodarone (PACERONE) 200 MG tablet Take 200 mg by mouth daily.  11  . amphetamine-dextroamphetamine (ADDERALL) 30 MG tablet Take 1 tablet by mouth 2 (two) times daily. 2nd dose after lunch 60 tablet 0  . amphetamine-dextroamphetamine (ADDERALL) 30 MG tablet Take 1 tablet by mouth 2 (two) times daily. 60 tablet 0  . aspirin EC 81 MG tablet Take 1 tablet (81 mg total) by mouth daily. 90 tablet 3  . blood glucose meter kit and supplies KIT Dispense based on patient and insurance preference. Use up to four times daily as directed. (FOR ICD-9 250.00, 250.01). 1 each 0  . buPROPion (WELLBUTRIN XL) 300 MG 24 hr tablet Take 1 tablet (300 mg total) by mouth daily. 30 tablet 2  . buPROPion (WELLBUTRIN) 100 MG tablet Take 1 tablet (100 mg total) by mouth daily. Add to Wellbutrin '300mg'$  already taking. 30 tablet 2  . diltiazem (CARDIZEM CD) 240 MG 24 hr capsule Take 1 capsule (240 mg total) by mouth daily. KEEP OV. 90 capsule 0  . EASY TALK BLOOD GLUCOSE TEST test strip use to test blood sugar once daily 100 each 1  . escitalopram (LEXAPRO) 20 MG tablet  Take '20mg'$  tablets twice a day. 60 tablet 2  . HYDROcodone-acetaminophen (NORCO) 7.5-325 MG tablet Take 1 tablet by mouth every 8 (eight) hours as needed.  0  . LORazepam (ATIVAN) 1 MG tablet TAKE 1 TABLET BY MOUTH EVERY 8 HOURS only as needed 90 tablet 2  . metFORMIN (GLUCOPHAGE) 500 MG tablet Take 1 tablet (500 mg total) by mouth 2 (two) times daily with a meal. 180 tablet 0  . NARCAN 4 MG/0.1ML  LIQD nasal spray kit Inject solution in to one nostril for suspected overdose. Repeat in 3-5 minutes if necessary in the other nostril. CALL 911  0  . oxyCODONE (ROXICODONE) 15 MG immediate release tablet   0  . pravastatin (PRAVACHOL) 40 MG tablet Take 1 tablet (40 mg total) by mouth daily. KEEP OV. 90 tablet 0  . Testosterone 20.25 MG/ACT (1.62%) GEL place 4 pumps on the skin every morning 150 g 2  . tiZANidine (ZANAFLEX) 4 MG tablet Take 4 mg by mouth once.     Marland Kitchen VICTOZA 18 MG/3ML SOPN Inject 0.3 mLs (1.8 mg total) into the skin daily. (54/1.8=30) 9 mL 2  . zolpidem (AMBIEN CR) 12.5 MG CR tablet Take 12.5 mg by mouth at bedtime.  3  . lisinopril (PRINIVIL,ZESTRIL) 5 MG tablet Take 1 tablet (5 mg total) by mouth daily. KEEP OV. (Patient not taking: Reported on 12/31/2017) 90 tablet 0   No current facility-administered medications for this visit.      Past Medical History:  Diagnosis Date  . Anemia   . Diabetes mellitus    controlled  . ED (erectile dysfunction)   . Gout   . Kidney stones    stent, lithotripsy  . Lymphadenopathy   . Morbid obesity ([place])   . OSA (obstructive sleep apnea)    CPAP-17.5 cm water pressure  . Paroxysmal atrial fibrillation (HCC)   . Pneumonia   . S/P emergency tracheotomy for assistance in breathing [facility])    at age 54  . Testosterone deficiency   . Vitamin D deficiency     Past Surgical History:  Procedure Laterality Date  . CHOLECYSTECTOMY    . CYSTOSCOPY     retrograde and double J catheter insertion.  Marland Kitchen FINGER SURGERY     ulnar digital nerve and artery right index finger  . GASTRIC BYPASS  2005   600 lbs prior to surgery  . TONSILLECTOMY      Social History   Socioeconomic History  . Marital status: Married    Spouse name: Not on file  . Number of children: Not on file  . Years of education: Not on file  . Highest education level: Not on file  Occupational History  . Not on file  Social Needs  . Financial resource strain: Not on  file  . Food insecurity:    Worry: Not on file    Inability: Not on file  . Transportation needs:    Medical: Not on file    Non-medical: Not on file  Tobacco Use  . Smoking status: Never Smoker  . Smokeless tobacco: Never Used  Substance and Sexual Activity  . Alcohol use: No  . Drug use: No    Comment: prior cocaine abuse (heavy) in his 63s.  denies subsequent use  . Sexual activity: Yes    Partners: Female    Birth control/protection: None    Comment: on full disability, separated, no regular exercise,walks some, drinks pot of coffee a day.  Lifestyle  . Physical  activity:    Days per week: Not on file    Minutes per session: Not on file  . Stress: Not on file  Relationships  . Social connections:    Talks on phone: Not on file    Gets together: Not on file    Attends religious service: Not on file    Active member of club or organization: Not on file    Attends meetings of clubs or organizations: Not on file    Relationship status: Not on file  . Intimate partner violence:    Fear of current or ex partner: Not on file    Emotionally abused: Not on file    Physically abused: Not on file    Forced sexual activity: Not on file  Other Topics Concern  . Not on file  Social History Narrative   Drinks one pot of coffee daily.   Regular exercise-no, walks some      Lives in [place] with roommate.   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    Family History  Problem Relation Age of Onset  . Cancer Mother        lung, heavy smoker  . Hypertension Mother   . Hyperlipidemia Mother   . Cancer Father        melanoma  . Aneurysm Father        cardiac  . Hyperlipidemia Father   . Hypertension Father   . Hyperlipidemia Sister   . Hypertension Sister   . Hyperlipidemia Brother   . Hypertension Brother   . Stroke Other     ROS: no fevers or chills, productive cough, hemoptysis, dysphasia, odynophagia, melena, hematochezia, dysuria,  hematuria, rash, seizure activity, orthopnea, PND, pedal edema, claudication. Remaining systems are negative.  Physical Exam: Well-developed morbidly obese in no acute distress.  Skin is warm and dry.  HEENT is normal.  Neck is supple.  Chest is clear to auscultation with normal expansion.  Cardiovascular exam is regular rate and rhythm.  Abdominal exam nontender or distended. No masses palpated. Extremities show no edema. neuro grossly intact  ECG-normal sinus rhythm at a rate of 78.  Left axis deviation.  Anterior infarct.  Personally reviewed  A/P  1 paroxysmal atrial fibrillation-patient remains in sinus rhythm today.  We will continue with amiodarone.  Check TSH, liver functions and chest x-ray.  He had a life-threatening bleed on anticoagulation previously.  We again discussed options today.  He would prefer to be off of anticoagulation and understands the higher risk of CVA.  Continue aspirin.  Note we discussed discontinuing amiodarone.  However he is extremely symptomatic with atrial fibrillation.  As he continues to lose weight the chances of recurrent atrial fibrillation may be less.  We will consider that in the next 6 to 12 months.  2 hypertension-patient's blood pressure is controlled today.  Continue present medications and follow.  3 morbid obesity-we discussed the importance of weight loss.  4 history of chest pain-no recurrent symptoms.  Previous nuclear study showed ischemia but may have been related to patient's size.  We have elected medical therapy.  Kirk Ruths, MD

## 2017-12-27 ENCOUNTER — Other Ambulatory Visit: Payer: Self-pay | Admitting: Family Medicine

## 2017-12-29 ENCOUNTER — Other Ambulatory Visit: Payer: Self-pay | Admitting: *Deleted

## 2017-12-29 DIAGNOSIS — F4323 Adjustment disorder with mixed anxiety and depressed mood: Secondary | ICD-10-CM | POA: Diagnosis not present

## 2017-12-29 MED ORDER — DILTIAZEM HCL ER COATED BEADS 240 MG PO CP24: 240.0000 mg | ORAL_CAPSULE | Freq: Every day | ORAL | 0 refills | Status: DC

## 2017-12-29 MED ORDER — PRAVASTATIN SODIUM 40 MG PO TABS: 40.0000 mg | ORAL_TABLET | Freq: Every day | ORAL | 0 refills | Status: DC

## 2017-12-31 ENCOUNTER — Encounter: Payer: Self-pay | Admitting: Cardiology

## 2017-12-31 ENCOUNTER — Ambulatory Visit (INDEPENDENT_AMBULATORY_CARE_PROVIDER_SITE_OTHER): Payer: BLUE CROSS/BLUE SHIELD

## 2017-12-31 ENCOUNTER — Ambulatory Visit (INDEPENDENT_AMBULATORY_CARE_PROVIDER_SITE_OTHER): Payer: BLUE CROSS/BLUE SHIELD | Admitting: Cardiology

## 2017-12-31 VITALS — BP 124/85 | HR 78 | Ht 68.5 in | Wt 320.0 lb

## 2017-12-31 DIAGNOSIS — I4891 Unspecified atrial fibrillation: Secondary | ICD-10-CM

## 2017-12-31 DIAGNOSIS — R918 Other nonspecific abnormal finding of lung field: Secondary | ICD-10-CM | POA: Diagnosis not present

## 2017-12-31 DIAGNOSIS — I10 Essential (primary) hypertension: Secondary | ICD-10-CM

## 2017-12-31 NOTE — Patient Instructions (Signed)
Medication Instructions:   NO CHANGE  Labwork:  Your physician recommends that you return for lab work WHEN CONVENIENT   Testing/Procedures:  A chest x-ray takes a picture of the organs and structures inside the chest, including the heart, lungs, and blood vessels. This test can show several things, including, whether the heart is enlarges; whether fluid is building up in the lungs; and whether pacemaker / defibrillator leads are still in place.   Follow-Up:  Your physician wants you to follow-up in: 6 MONTHS WITH DR CRENSHAW You will receive a reminder letter in the mail two months in advance. If you don't receive a letter, please call our office to schedule the follow-up appointment.   If you need a refill on your cardiac medications before your next appointment, please call your pharmacy.    

## 2018-01-02 ENCOUNTER — Ambulatory Visit: Payer: Self-pay | Admitting: Family Medicine

## 2018-01-02 NOTE — Progress Notes (Deleted)
   Subjective:    Patient ID: Rodney Bright, male    DOB: Aug 22, 1960, 57 y.o.   MRN: [id number]  HPI    Review of Systems     Objective:   Physical Exam        Assessment & Plan:

## 2018-01-05 DIAGNOSIS — F4323 Adjustment disorder with mixed anxiety and depressed mood: Secondary | ICD-10-CM | POA: Diagnosis not present

## 2018-01-07 ENCOUNTER — Other Ambulatory Visit: Payer: Self-pay | Admitting: Family Medicine

## 2018-01-07 DIAGNOSIS — E119 Type 2 diabetes mellitus without complications: Secondary | ICD-10-CM

## 2018-01-07 DIAGNOSIS — E291 Testicular hypofunction: Secondary | ICD-10-CM

## 2018-01-07 NOTE — Telephone Encounter (Signed)
I did refill his medicaiton but he needs to go for the testosterone level lab that was ordered in August.

## 2018-01-07 NOTE — Telephone Encounter (Signed)
Left VM with recommendation  

## 2018-01-09 ENCOUNTER — Other Ambulatory Visit: Payer: Self-pay

## 2018-01-09 DIAGNOSIS — G609 Hereditary and idiopathic neuropathy, unspecified: Secondary | ICD-10-CM

## 2018-01-09 MED ORDER — AMBULATORY NON FORMULARY MEDICATION: 2 refills | Status: DC

## 2018-01-12 ENCOUNTER — Other Ambulatory Visit: Payer: Self-pay | Admitting: Family Medicine

## 2018-01-12 ENCOUNTER — Other Ambulatory Visit: Payer: Self-pay | Admitting: *Deleted

## 2018-01-12 DIAGNOSIS — F4323 Adjustment disorder with mixed anxiety and depressed mood: Secondary | ICD-10-CM | POA: Diagnosis not present

## 2018-01-12 MED ORDER — AMIODARONE HCL 200 MG PO TABS: 200.0000 mg | ORAL_TABLET | Freq: Every day | ORAL | 3 refills | Status: DC

## 2018-01-13 NOTE — Telephone Encounter (Signed)
Routing to pcp for signature.Thurley Francesconi [name], CMA  

## 2018-01-19 DIAGNOSIS — F4323 Adjustment disorder with mixed anxiety and depressed mood: Secondary | ICD-10-CM | POA: Diagnosis not present

## 2018-01-26 DIAGNOSIS — F4323 Adjustment disorder with mixed anxiety and depressed mood: Secondary | ICD-10-CM | POA: Diagnosis not present

## 2018-01-29 ENCOUNTER — Other Ambulatory Visit (HOSPITAL_COMMUNITY): Payer: Self-pay

## 2018-01-29 NOTE — Telephone Encounter (Signed)
Cassie at [facility] called and stated that the patient was prescribed Lorazepam on 12/11/17 after speaking with [place] about stopping the medication in July. Patient is taking both Hydrocodone and Oxycodone and they will have to stop both medications if we continue to prescribe the Lorazepam. Please review and advise.

## 2018-01-29 NOTE — Telephone Encounter (Signed)
Called pharmacy and discontinued the Lorazepam. Left a vm for patient informing him that Dr. Gilmore Laroche discontinued the Lorazepam because he is taking Hydrocodone and Oxycodone. I left instructions on how he should taper off( bid for a week, and 1 daily for a week). I called and left a message with Cassie at [facility] informing her of what Dr. Gilmore Laroche decided for the Lorazepam. Nothing further is needed at this time.

## 2018-01-29 NOTE — Telephone Encounter (Signed)
Rodney Bright know that we can not continue to prescribe ativan for the reason stated. Nor did he inform this to [place]. He should taper down dose to stop in next 10 days

## 2018-02-02 DIAGNOSIS — F4323 Adjustment disorder with mixed anxiety and depressed mood: Secondary | ICD-10-CM | POA: Diagnosis not present

## 2018-02-09 ENCOUNTER — Other Ambulatory Visit: Payer: Self-pay | Admitting: Family Medicine

## 2018-02-09 DIAGNOSIS — F4323 Adjustment disorder with mixed anxiety and depressed mood: Secondary | ICD-10-CM | POA: Diagnosis not present

## 2018-02-09 NOTE — Telephone Encounter (Signed)
Routing to pcp for signature.Joei Frangos [name], CMA  

## 2018-02-16 ENCOUNTER — Telehealth (HOSPITAL_COMMUNITY): Payer: Self-pay

## 2018-02-16 DIAGNOSIS — F4323 Adjustment disorder with mixed anxiety and depressed mood: Secondary | ICD-10-CM | POA: Diagnosis not present

## 2018-02-16 NOTE — Telephone Encounter (Signed)
Patient needs a refill on Bupropion HCL 100mg  tablets. Last seen 12-11-17. Next appointment is 04-14-2018. Please advise

## 2018-02-17 ENCOUNTER — Other Ambulatory Visit (HOSPITAL_COMMUNITY): Payer: Self-pay

## 2018-02-17 MED ORDER — BUPROPION HCL 100 MG PO TABS: 100.0000 mg | ORAL_TABLET | Freq: Every day | ORAL | 1 refills | Status: DC

## 2018-02-17 NOTE — Telephone Encounter (Signed)
Can send this prescription bupropion.

## 2018-02-17 NOTE — Telephone Encounter (Signed)
Sent in refill to [facility]

## 2018-02-20 DIAGNOSIS — F4323 Adjustment disorder with mixed anxiety and depressed mood: Secondary | ICD-10-CM | POA: Diagnosis not present

## 2018-03-02 ENCOUNTER — Ambulatory Visit: Payer: BLUE CROSS/BLUE SHIELD | Admitting: Family Medicine

## 2018-03-02 DIAGNOSIS — F988 Other specified behavioral and emotional disorders with onset usually occurring in childhood and adolescence: Secondary | ICD-10-CM | POA: Insufficient documentation

## 2018-03-02 DIAGNOSIS — F4323 Adjustment disorder with mixed anxiety and depressed mood: Secondary | ICD-10-CM | POA: Diagnosis not present

## 2018-03-02 NOTE — Progress Notes (Deleted)
Subjective:    CC: DM check, 3 mo  HPI:  Diabetes - no hypoglycemic events. No wounds or sores that are not healing well. No increased thirst or urination. Checking glucose at home. Taking medications as prescribed without any side effects.  Hypertension- Pt denies chest pain, SOB, dizziness, or heart palpitations.  Taking meds as directed w/o problems.  Denies medication side effects.    ADD - Reports symptoms are well controlled on current regimen. Denies any problems with insomnia, chest pain, palpitations, or SOB.     Past medical history, Surgical history, Family history not pertinant except as noted below, Social history, Allergies, and medications have been entered into the medical record, reviewed, and corrections made.   Review of Systems: No fevers, chills, night sweats, weight loss, chest pain, or shortness of breath.   Objective:    General: Well Developed, well nourished, and in no acute distress.  Neuro: Alert and oriented x3, extra-ocular muscles intact, sensation grossly intact.  HEENT: Normocephalic, atraumatic  Skin: Warm and dry, no rashes. Cardiac: Regular rate and rhythm, no murmurs rubs or gallops, no lower extremity edema.  Respiratory: Clear to auscultation bilaterally. Not using accessory muscles, speaking in full sentences.   Impression and Recommendations:    DM -   ADD -   HTN -

## 2018-03-03 ENCOUNTER — Ambulatory Visit (INDEPENDENT_AMBULATORY_CARE_PROVIDER_SITE_OTHER): Payer: BLUE CROSS/BLUE SHIELD | Admitting: Family Medicine

## 2018-03-03 ENCOUNTER — Encounter: Payer: Self-pay | Admitting: Family Medicine

## 2018-03-03 VITALS — BP 121/88 | HR 72 | Temp 98.5°F | Ht 69.0 in | Wt 298.0 lb

## 2018-03-03 DIAGNOSIS — F988 Other specified behavioral and emotional disorders with onset usually occurring in childhood and adolescence: Secondary | ICD-10-CM | POA: Diagnosis not present

## 2018-03-03 DIAGNOSIS — Z6841 Body Mass Index (BMI) 40.0 and over, adult: Secondary | ICD-10-CM

## 2018-03-03 DIAGNOSIS — G4700 Insomnia, unspecified: Secondary | ICD-10-CM | POA: Diagnosis not present

## 2018-03-03 DIAGNOSIS — E118 Type 2 diabetes mellitus with unspecified complications: Secondary | ICD-10-CM

## 2018-03-03 DIAGNOSIS — I480 Paroxysmal atrial fibrillation: Secondary | ICD-10-CM

## 2018-03-03 LAB — POCT GLYCOSYLATED HEMOGLOBIN (HGB A1C): Hemoglobin A1C: 5.5 % (ref 4.0–5.6)

## 2018-03-03 MED ORDER — AMPHETAMINE-DEXTROAMPHETAMINE 30 MG PO TABS: 30.0000 mg | ORAL_TABLET | Freq: Two times a day (BID) | ORAL | 0 refills | Status: DC

## 2018-03-03 MED ORDER — AMPHETAMINE-DEXTROAMPHETAMINE 30 MG PO TABS: 1.0000 | ORAL_TABLET | Freq: Two times a day (BID) | ORAL | 0 refills | Status: DC

## 2018-03-03 NOTE — Progress Notes (Signed)
Subjective:    CC:   HPI:  Diabetes - no hypoglycemic events. No wounds or sores that are not healing well. No increased thirst or urination. Checking glucose at home. Taking medications as prescribed without any side effects.  Stopped the metformin out 3 months ago he is still on the Victoza..    ADD - Reports symptoms are well controlled on current regime. Denies any problems with insomnia, chest pain, palpitations, or SOB.    He has had more problems sleeping.  He has had more insomnia recently.nto sure why.  Says can take hours to fall asleep.    Afib - he did see Dr. Jens Somrenshaw and was doing well.  In fact they are hoping that may be at some point will be able to get him off of his medication for A. fib which she will be excited about.   Past medical history, Surgical history, Family history not pertinant except as noted below, Social history, Allergies, and medications have been entered into the medical record, reviewed, and corrections made.   Review of Systems: No fevers, chills, night sweats, weight loss, chest pain, or shortness of breath.   Objective:    General: Well Developed, well nourished, and in no acute distress.  Neuro: Alert and oriented x3, extra-ocular muscles intact, sensation grossly intact.  HEENT: Normocephalic, atraumatic  Skin: Warm and dry, no rashes. Cardiac: Regular rate and rhythm, no murmurs rubs or gallops, no lower extremity edema.  Respiratory: Clear to auscultation bilaterally. Not using accessory muscles, speaking in full sentences.   Impression and Recommendations:    DM -doing fantastic off of metformin.  We will go ahead and remove her medication left wrist.  We will continue Victoza for now.  Repeat hemoglobin A1c in 4 to 6 months. Lab Results  Component Value Date   HGBA1C 5.5 03/03/2018    ADD -we discussed options including trying to take a second dose a little earlier in the afternoon to see if this affects his sleep pattern.  He also  may not need as much medication now that he has lost a significant amount of weight.  Insomnia-please see note above.  Morbid obesity/BMI 43-status post gastric bypass-encouraged him to schedule physical in January so we can do some up-to-date blood work and check for nutritional deficiencies.  He is really done fantastic with a combination of the Victoza to help control his diabetes as well as recently treating his ADD.  A Fib-he is off of his ACE inhibitor but still on Cardizem.  Is not currently anticoagulated except for aspirin.

## 2018-03-04 ENCOUNTER — Other Ambulatory Visit: Payer: Self-pay | Admitting: Family Medicine

## 2018-03-04 DIAGNOSIS — E291 Testicular hypofunction: Secondary | ICD-10-CM

## 2018-03-06 ENCOUNTER — Other Ambulatory Visit: Payer: Self-pay | Admitting: Family Medicine

## 2018-03-09 DIAGNOSIS — F4323 Adjustment disorder with mixed anxiety and depressed mood: Secondary | ICD-10-CM | POA: Diagnosis not present

## 2018-03-11 ENCOUNTER — Other Ambulatory Visit (HOSPITAL_COMMUNITY): Payer: Self-pay

## 2018-03-11 DIAGNOSIS — F331 Major depressive disorder, recurrent, moderate: Secondary | ICD-10-CM

## 2018-03-11 MED ORDER — ESCITALOPRAM OXALATE 20 MG PO TABS: ORAL_TABLET | ORAL | 1 refills | Status: DC

## 2018-03-11 MED ORDER — BUPROPION HCL ER (XL) 300 MG PO TB24: 300.0000 mg | ORAL_TABLET | Freq: Every day | ORAL | 1 refills | Status: DC

## 2018-04-03 ENCOUNTER — Other Ambulatory Visit: Payer: Self-pay | Admitting: Family Medicine

## 2018-04-03 ENCOUNTER — Other Ambulatory Visit: Payer: Self-pay | Admitting: *Deleted

## 2018-04-03 DIAGNOSIS — E291 Testicular hypofunction: Secondary | ICD-10-CM

## 2018-04-03 MED ORDER — PRAVASTATIN SODIUM 40 MG PO TABS: 40.0000 mg | ORAL_TABLET | Freq: Every day | ORAL | 0 refills | Status: DC

## 2018-04-03 MED ORDER — DILTIAZEM HCL ER COATED BEADS 240 MG PO CP24: 240.0000 mg | ORAL_CAPSULE | Freq: Every day | ORAL | 0 refills | Status: DC

## 2018-04-13 ENCOUNTER — Other Ambulatory Visit (HOSPITAL_COMMUNITY): Payer: Self-pay | Admitting: Psychiatry

## 2018-04-13 DIAGNOSIS — F331 Major depressive disorder, recurrent, moderate: Secondary | ICD-10-CM

## 2018-04-13 DIAGNOSIS — F4323 Adjustment disorder with mixed anxiety and depressed mood: Secondary | ICD-10-CM | POA: Diagnosis not present

## 2018-04-13 MED ORDER — BUPROPION HCL 100 MG PO TABS: 100.0000 mg | ORAL_TABLET | Freq: Every day | ORAL | 0 refills | Status: DC

## 2018-04-13 MED ORDER — ESCITALOPRAM OXALATE 20 MG PO TABS: ORAL_TABLET | ORAL | 0 refills | Status: DC

## 2018-04-13 MED ORDER — BUPROPION HCL ER (XL) 300 MG PO TB24: 300.0000 mg | ORAL_TABLET | Freq: Every day | ORAL | 0 refills | Status: DC

## 2018-04-13 NOTE — Telephone Encounter (Signed)
Per Dr. Lynnae Sandhoff, I sent a one month supply of Wellbutrin 100mg , Wellbutrin 300mg  and Lexapro to the pharmacy.

## 2018-04-13 NOTE — Telephone Encounter (Signed)
Can send 

## 2018-04-13 NOTE — Telephone Encounter (Signed)
Pt needs refill on wellbutrin 300 and 100 and lexapro sent to [facility].  Pt is in [place] and will be back next month to be seen.

## 2018-04-14 ENCOUNTER — Ambulatory Visit (HOSPITAL_COMMUNITY): Payer: Self-pay | Admitting: Psychiatry

## 2018-04-20 DIAGNOSIS — F4323 Adjustment disorder with mixed anxiety and depressed mood: Secondary | ICD-10-CM | POA: Diagnosis not present

## 2018-04-25 ENCOUNTER — Other Ambulatory Visit: Payer: Self-pay | Admitting: Family Medicine

## 2018-04-25 DIAGNOSIS — E291 Testicular hypofunction: Secondary | ICD-10-CM

## 2018-04-27 DIAGNOSIS — F4323 Adjustment disorder with mixed anxiety and depressed mood: Secondary | ICD-10-CM | POA: Diagnosis not present

## 2018-04-28 ENCOUNTER — Encounter: Payer: Self-pay | Admitting: Family Medicine

## 2018-04-30 ENCOUNTER — Other Ambulatory Visit: Payer: Self-pay | Admitting: Family Medicine

## 2018-05-08 DIAGNOSIS — F4323 Adjustment disorder with mixed anxiety and depressed mood: Secondary | ICD-10-CM | POA: Diagnosis not present

## 2018-05-14 ENCOUNTER — Other Ambulatory Visit: Payer: Self-pay | Admitting: Family Medicine

## 2018-05-18 DIAGNOSIS — F4323 Adjustment disorder with mixed anxiety and depressed mood: Secondary | ICD-10-CM | POA: Diagnosis not present

## 2018-05-20 ENCOUNTER — Other Ambulatory Visit: Payer: Self-pay | Admitting: Family Medicine

## 2018-05-26 ENCOUNTER — Telehealth (HOSPITAL_COMMUNITY): Payer: Self-pay

## 2018-05-26 NOTE — Telephone Encounter (Signed)
Received a medication fax request from the pharmacy for Lexapro 20mg , and Bupropion XL 300mg .  Patient hasn't been seen since 12-11-17, cancelled appointment on 04-14-2018. He doesn't have a future appointment scheduled.  Please advise

## 2018-05-27 NOTE — Telephone Encounter (Signed)
We can wait till patient call back or call him  if appointment is needed

## 2018-05-27 NOTE — Telephone Encounter (Signed)
Ativan was discontinued because patient was taking pain meds prescribed by the neurological center. The fax request sent to [place] is on for bupropion and lexapro but patient has not been seen since September of last year, and canceled the appt he had in January.

## 2018-05-27 NOTE — Telephone Encounter (Signed)
There was a concern of ativan or note, we discontinued earlier, not sure if he is coming back, can you look at the info regarding med calls or concern from other staff in the past

## 2018-06-01 DIAGNOSIS — F4323 Adjustment disorder with mixed anxiety and depressed mood: Secondary | ICD-10-CM | POA: Diagnosis not present

## 2018-06-09 ENCOUNTER — Other Ambulatory Visit: Payer: Self-pay | Admitting: Family Medicine

## 2018-06-09 DIAGNOSIS — F4323 Adjustment disorder with mixed anxiety and depressed mood: Secondary | ICD-10-CM | POA: Diagnosis not present

## 2018-06-12 ENCOUNTER — Telehealth: Payer: Self-pay | Admitting: Family Medicine

## 2018-06-12 DIAGNOSIS — Z0000 Encounter for general adult medical examination without abnormal findings: Secondary | ICD-10-CM

## 2018-06-12 DIAGNOSIS — I10 Essential (primary) hypertension: Secondary | ICD-10-CM

## 2018-06-12 DIAGNOSIS — E291 Testicular hypofunction: Secondary | ICD-10-CM

## 2018-06-12 DIAGNOSIS — E118 Type 2 diabetes mellitus with unspecified complications: Secondary | ICD-10-CM

## 2018-06-12 NOTE — Telephone Encounter (Signed)
Labs pended for PCP review.  

## 2018-06-12 NOTE — Telephone Encounter (Signed)
Pt advised that labs have been faxed.....Naevia Unterreiner [name], CMA  

## 2018-06-12 NOTE — Telephone Encounter (Signed)
Pt has a cpe scheduled with Dr.Metheney on Monday 06-22-18 and he would like for you to send his lab orders down so he can get them before his appt time

## 2018-06-15 DIAGNOSIS — F4323 Adjustment disorder with mixed anxiety and depressed mood: Secondary | ICD-10-CM | POA: Diagnosis not present

## 2018-06-22 ENCOUNTER — Encounter: Payer: Self-pay | Admitting: Family Medicine

## 2018-06-22 ENCOUNTER — Ambulatory Visit (INDEPENDENT_AMBULATORY_CARE_PROVIDER_SITE_OTHER): Payer: BLUE CROSS/BLUE SHIELD | Admitting: Family Medicine

## 2018-06-22 ENCOUNTER — Other Ambulatory Visit: Payer: Self-pay

## 2018-06-22 VITALS — BP 132/79 | HR 69 | Ht 69.0 in | Wt 324.0 lb

## 2018-06-22 DIAGNOSIS — E118 Type 2 diabetes mellitus with unspecified complications: Secondary | ICD-10-CM | POA: Diagnosis not present

## 2018-06-22 DIAGNOSIS — M793 Panniculitis, unspecified: Secondary | ICD-10-CM | POA: Diagnosis not present

## 2018-06-22 DIAGNOSIS — Z23 Encounter for immunization: Secondary | ICD-10-CM

## 2018-06-22 DIAGNOSIS — Z0000 Encounter for general adult medical examination without abnormal findings: Secondary | ICD-10-CM

## 2018-06-22 DIAGNOSIS — I83893 Varicose veins of bilateral lower extremities with other complications: Secondary | ICD-10-CM

## 2018-06-22 LAB — POCT UA - MICROALBUMIN
Albumin/Creatinine Ratio, Urine, POC: <30
Creatinine, POC: 200 mg/dL
Microalbumin Ur, POC: 30 mg/L

## 2018-06-22 NOTE — Patient Instructions (Signed)

## 2018-06-22 NOTE — Progress Notes (Signed)
Established Patient Office Visit  Subjective:  Patient ID: Rodney Bright, male    DOB: [birth year]  Age: 58 y.o. MRN: [id number]  CC:  Chief Complaint  Patient presents with  . Annual Exam    HPI Rodney Bright presents for CPE.  He says overall he is doing well.  He has lost a significant amount of weight though more recently per our scale he has gained some back.  He says he has been stressed eating a little bit but says that is a lot compared to what he weighed at the gym about a week ago which was 303 pounds.  He has been exercising lately.  He has been walking and doing yoga.  Ultimately he would like to get down to 260 pounds that is his weight loss goal.  He did have a couple of concerns today that he wanted to address.  He has some significant varicose veins on the legs that are large and raised up.  He has one on his right outer thigh that occasionally gets traumatized and bleeds.  Wants to know what could be done about that.  So would like to have a consult with plastic surgeon for excess skin removal.  We had tried to do this last year for him but unfortunately could not find anyone within his healthcare network.  Has a lot of redundancy of skin with his weight loss and it causes a lot of erythema and rashes.  Past Medical History:  Diagnosis Date  . Anemia   . Diabetes mellitus    controlled  . ED (erectile dysfunction)   . Gout   . Kidney stones    stent, lithotripsy  . Lymphadenopathy   . Morbid obesity ([place])   . OSA (obstructive sleep apnea)    CPAP-17.5 cm water pressure  . Paroxysmal atrial fibrillation (H[facility])   . Pneumonia   . S/P emergency tracheotomy for assistance in breathing [facility])    at age 62  . Testosterone deficiency   . Vitamin D deficiency     Past Surgical History:  Procedure Laterality Date  . CHOLECYSTECTOMY    . CYSTOSCOPY     retrograde and double J catheter insertion.  Marland Kitchen FINGER SURGERY     ulnar digital nerve and artery right index  finger  . GASTRIC BYPASS  2005   600 lbs prior to surgery  . TONSILLECTOMY      Family History  Problem Relation Age of Onset  . Cancer Mother        lung, heavy smoker  . Hypertension Mother   . Hyperlipidemia Mother   . Cancer Father        melanoma  . Aneurysm Father        cardiac  . Hyperlipidemia Father   . Hypertension Father   . Hyperlipidemia Sister   . Hypertension Sister   . Hyperlipidemia Brother   . Hypertension Brother   . Stroke Other     Social History   Socioeconomic History  . Marital status: Married    Spouse name: Not on file  . Number of children: Not on file  . Years of education: Not on file  . Highest education level: Not on file  Occupational History  . Not on file  Social Needs  . Financial resource strain: Not on file  . Food insecurity:    Worry: Not on file    Inability: Not on file  . Transportation needs:    Medical: Not  on file    Non-medical: Not on file  Tobacco Use  . Smoking status: Never Smoker  . Smokeless tobacco: Never Used  Substance and Sexual Activity  . Alcohol use: No  . Drug use: No    Comment: prior cocaine abuse (heavy) in his 63s.  denies subsequent use  . Sexual activity: Yes    Partners: Female    Birth control/protection: None    Comment: on full disability, separated, no regular exercise,walks some, drinks pot of coffee a day.  Lifestyle  . Physical activity:    Days per week: Not on file    Minutes per session: Not on file  . Stress: Not on file  Relationships  . Social connections:    Talks on phone: Not on file    Gets together: Not on file    Attends religious service: Not on file    Active member of club or organization: Not on file    Attends meetings of clubs or organizations: Not on file    Relationship status: Not on file  . Intimate partner violence:    Fear of current or ex partner: Not on file    Emotionally abused: Not on file    Physically abused: Not on file    Forced sexual  activity: Not on file  Other Topics Concern  . Not on file  Social History Narrative   Drinks one pot of coffee daily.   Regular exercise-no, walks some      Lives in [place] with roommate.   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    Outpatient Medications Prior to Visit  Medication Sig Dispense Refill  . EASY COMFORT PEN NEEDLES 31G X 8 MM MISC use once a day with victoza    . alfuzosin (UROXATRAL) 10 MG 24 hr tablet Take 10 mg by mouth daily.  10  . allopurinol (ZYLOPRIM) 100 MG tablet take 1 tablet by mouth every day 30 tablet 0  . AMBULATORY NON FORMULARY MEDICATION Medication Name: CPAP machine with nasal mask and supplies set to 17.5 cm water pressure.  No humidifier.  Sent to [facility].  See sleep study from 2009 attached.  Dx: OSA. 1 Units PRN.  Marland Kitchen AMBULATORY NON FORMULARY MEDICATION Medication Name: Needs CPAP tubing, supplies. Dx OSA.  09/05/17. 1 vial 0  . AMBULATORY NON FORMULARY MEDICATION Medication Name: Cyclobenzaprine 2% gabapentin 3% Baclofen 2%  Diclofenac 3% Qty# 120 ml Sig: apply small amount topically to effected area 2-3 times a day as directed Fax to [phone] 120 mL 2  . amiodarone (PACERONE) 200 MG tablet Take 1 tablet (200 mg total) by mouth daily. 90 tablet 3  . amphetamine-dextroamphetamine (ADDERALL) 30 MG tablet Take 1 tablet by mouth 2 (two) times daily. 60 tablet 0  . amphetamine-dextroamphetamine (ADDERALL) 30 MG tablet Take one tablet by mouth twice daily; 2nd dose after lunch 60 tablet 0  . aspirin EC 81 MG tablet Take 1 tablet (81 mg total) by mouth daily. (Patient not taking: Reported on 03/03/2018) 90 tablet 3  . blood glucose meter kit and supplies KIT Dispense based on patient and insurance preference. Use up to four times daily as directed. (FOR ICD-9 250.00, 250.01). 1 each 0  . buPROPion (WELLBUTRIN XL) 300 MG 24 hr tablet Take 1 tablet (300 mg total) by mouth daily. 30 tablet 0  . buPROPion (WELLBUTRIN) 100 MG tablet  Take 1 tablet (100 mg total) by mouth daily. Add to Wellbutrin 373m  already taking. 30 tablet 0  . diltiazem (CARDIZEM CD) 240 MG 24 hr capsule Take 1 capsule (240 mg total) by mouth daily. KEEP OV. 90 capsule 0  . EASY PLUS II GLUCOSE TEST test strip use to test blood sugar once daily 100 each 11  . escitalopram (LEXAPRO) 20 MG tablet Take 29m tablets twice a day. 60 tablet 0  . HYDROcodone-acetaminophen (NORCO) 7.5-325 MG tablet Take 1 tablet by mouth every 8 (eight) hours as needed.  0  . NARCAN 4 MG/0.1ML LIQD nasal spray kit Inject solution in to one nostril for suspected overdose. Repeat in 3-5 minutes if necessary in the other nostril. CALL 911  0  . oxyCODONE (ROXICODONE) 15 MG immediate release tablet   0  . pravastatin (PRAVACHOL) 40 MG tablet Take 1 tablet (40 mg total) by mouth daily. KEEP OV. 90 tablet 0  . Testosterone 1.62 % GEL place 4 pumps on the skin every morning] 150 g 2  . tiZANidine (ZANAFLEX) 4 MG tablet Take 4 mg by mouth once.     .Marland KitchenVICTOZA 18 MG/3ML SOPN Inject 0.3 mLs (1.8 mg total) into the skin daily. (54/1.8=30) 9 mL 3  . zolpidem (AMBIEN CR) 12.5 MG CR tablet Take 12.5 mg by mouth at bedtime.  3   No facility-administered medications prior to visit.     Allergies  Allergen Reactions  . Colchicine Shortness Of Breath, Swelling and Itching  . Peach Flavor     "Peaches" " swelling of face"    ROS Review of Systems    Objective:    Physical Exam  Constitutional: He is oriented to person, place, and time. He appears well-developed and well-nourished.  HENT:  Head: Normocephalic and atraumatic.  Right Ear: External ear normal.  Left Ear: External ear normal.  Nose: Nose normal.  Mouth/Throat: Oropharynx is clear and moist.  Eyes: Pupils are equal, round, and reactive to light. Conjunctivae and EOM are normal.  Neck: Normal range of motion. Neck supple. No thyromegaly present.  Cardiovascular: Normal rate, regular rhythm, normal heart sounds and  intact distal pulses.  Pulmonary/Chest: Effort normal and breath sounds normal.  Abdominal: Soft.  Musculoskeletal: Normal range of motion.        General: No edema.  Lymphadenopathy:    He has no cervical adenopathy.  Neurological: He is alert and oriented to person, place, and time. He has normal reflexes. No cranial nerve deficit.  Skin: Skin is warm and dry.  He does have some very large raised varicose veins on both lower legs and even upper thigh area.  Psychiatric: He has a normal mood and affect. His behavior is normal. Judgment and thought content normal.    BP 132/79   Pulse 69   Ht 5' 9" (1.753 m)   Wt (!) 324 lb (147 kg)   SpO2 97%   BMI 47.85 kg/m  Wt Readings from Last 3 Encounters:  06/22/18 (!) 324 lb (147 kg)  03/03/18 298 lb (135.2 kg)  12/31/17 (!) 320 lb (145.2 kg)     Health Maintenance Due  Topic Date Due  . URINE MICROALBUMIN  10/15/2014  . FOOT EXAM  04/22/2018    There are no preventive care reminders to display for this patient.  Lab Results  Component Value Date   TSH 2.18 04/24/2017   Lab Results  Component Value Date   WBC 6.7 04/24/2017   HGB 12.4 (A) 04/24/2017   HCT 39 (A) 04/24/2017   MCV 87 04/22/2017  PLT 182 04/24/2017   Lab Results  Component Value Date   NA 143 04/24/2017   K 4.9 04/24/2017   CO2 24 04/22/2017   GLUCOSE 116 (H) 04/22/2017   BUN 12 04/24/2017   CREATININE 1.3 04/24/2017   BILITOT 0.5 04/22/2017   ALKPHOS 87 04/24/2017   AST 26 04/24/2017   ALT 22 04/24/2017   PROT 6.7 04/22/2017   ALBUMIN 4.1 04/22/2017   CALCIUM 9.2 04/22/2017   Lab Results  Component Value Date   CHOL 142 04/24/2017   Lab Results  Component Value Date   HDL 44 04/24/2017   Lab Results  Component Value Date   LDLCALC 77 04/24/2017   Lab Results  Component Value Date   TRIG 107 04/24/2017   Lab Results  Component Value Date   CHOLHDL 3.8 12/26/2015   Lab Results  Component Value Date   HGBA1C 5.5 03/03/2018       Assessment & Plan:   Problem List Items Addressed This Visit      Cardiovascular and Mediastinum   Varicose vein of leg     Endocrine   Controlled diabetes mellitus type 2 with complications ([place])   Relevant Orders   POCT UA - Microalbumin    Other Visit Diagnoses    Wellness examination    -  Primary   Relevant Orders   POCT UA - Microalbumin   Panniculitis       Relevant Orders   Ambulatory referral to Plastic Surgery   Need for Zostavax administration       Relevant Orders   Varicella-zoster vaccine IM (Shingrix) (Completed)     Keep up a regular exercise program and make sure you are eating a healthy diet Try to eat 4 servings of dairy a day, or if you are lactose intolerant take a calcium with vitamin D daily.  Your vaccines are up to date.   Varicose veins-discussed options.  Including compression stockings and even consultation with vein and vascular.  Panniculitis-we will refer to plastic surgery for consultation for removal of excess skin.  Diabetes-he is not here for routine checkup but we will add A1c to his labs and did go ahead and get a urine microalbumin for further work-up today.  For shingles vaccine given today.  We will follow-up in 2 to 6 months for second vaccine.  Reminded him that he is due for an updated tetanus in May.   No orders of the defined types were placed in this encounter.   Follow-up: Return in about 3 months (around 09/22/2018) for Diabetes and 2nd Shingles vaccine and Tdap. Beatrice Lecher, MD

## 2018-06-24 ENCOUNTER — Other Ambulatory Visit: Payer: Self-pay | Admitting: Family Medicine

## 2018-06-24 DIAGNOSIS — E291 Testicular hypofunction: Secondary | ICD-10-CM

## 2018-06-24 DIAGNOSIS — E119 Type 2 diabetes mellitus without complications: Secondary | ICD-10-CM

## 2018-06-24 NOTE — Telephone Encounter (Signed)
I cannot fill this medicaiton until he goes for labs.

## 2018-06-25 ENCOUNTER — Other Ambulatory Visit: Payer: Self-pay | Admitting: *Deleted

## 2018-06-25 DIAGNOSIS — F4323 Adjustment disorder with mixed anxiety and depressed mood: Secondary | ICD-10-CM | POA: Diagnosis not present

## 2018-06-25 MED ORDER — PRAVASTATIN SODIUM 40 MG PO TABS: 40.0000 mg | ORAL_TABLET | Freq: Every day | ORAL | 6 refills | Status: DC

## 2018-06-29 ENCOUNTER — Other Ambulatory Visit: Payer: Self-pay

## 2018-06-29 DIAGNOSIS — F4323 Adjustment disorder with mixed anxiety and depressed mood: Secondary | ICD-10-CM | POA: Diagnosis not present

## 2018-06-29 MED ORDER — AMPHETAMINE-DEXTROAMPHETAMINE 30 MG PO TABS: 30.0000 mg | ORAL_TABLET | Freq: Two times a day (BID) | ORAL | 0 refills | Status: DC

## 2018-06-29 NOTE — Telephone Encounter (Signed)
Bill called for a refill on Adderall.

## 2018-07-06 ENCOUNTER — Ambulatory Visit (INDEPENDENT_AMBULATORY_CARE_PROVIDER_SITE_OTHER): Payer: BLUE CROSS/BLUE SHIELD | Admitting: Psychiatry

## 2018-07-06 ENCOUNTER — Other Ambulatory Visit: Payer: Self-pay

## 2018-07-06 ENCOUNTER — Encounter (HOSPITAL_COMMUNITY): Payer: Self-pay | Admitting: Psychiatry

## 2018-07-06 DIAGNOSIS — F5102 Adjustment insomnia: Secondary | ICD-10-CM | POA: Diagnosis not present

## 2018-07-06 DIAGNOSIS — F411 Generalized anxiety disorder: Secondary | ICD-10-CM

## 2018-07-06 DIAGNOSIS — F900 Attention-deficit hyperactivity disorder, predominantly inattentive type: Secondary | ICD-10-CM

## 2018-07-06 DIAGNOSIS — F331 Major depressive disorder, recurrent, moderate: Secondary | ICD-10-CM | POA: Diagnosis not present

## 2018-07-06 DIAGNOSIS — F4323 Adjustment disorder with mixed anxiety and depressed mood: Secondary | ICD-10-CM | POA: Diagnosis not present

## 2018-07-06 MED ORDER — BUPROPION HCL 100 MG PO TABS: 100.0000 mg | ORAL_TABLET | Freq: Every day | ORAL | 2 refills | Status: DC

## 2018-07-06 MED ORDER — BUPROPION HCL ER (XL) 300 MG PO TB24: 300.0000 mg | ORAL_TABLET | Freq: Every day | ORAL | 2 refills | Status: DC

## 2018-07-06 MED ORDER — ESCITALOPRAM OXALATE 20 MG PO TABS: ORAL_TABLET | ORAL | 2 refills | Status: DC

## 2018-07-06 NOTE — Progress Notes (Signed)
Patient ID: Rodney Bright, male   DOB: [birth year], 58 y.o.   MRN: [id number]   [facility] Follow-up tele medicine appointment  Rodney Bright 1961-01-24  Date: 07/06/2018  HPI Comments: Rodney Bright is a 58y/o male with a past psychiatric history significant for symptoms of depression and anxiety. The patient was referred for psychiatric services for medication management.   I connected with Rodney Bright on 07/06/18 at  1:15 PM EDT by telephone and verified that I am speaking with the correct person using two identifiers.   I discussed the limitations, risks, security and privacy concerns of performing an evaluation and management service by telephone and the availability of in person appointments. I also discussed with the patient that there may be a patient responsible charge related to this service. The patient expressed understanding and agreed to proceed.  Doing fair. On pain meds.  Not taking ativan as it was stopped, handling anxiety on lexapro Takes adderall from primary care No side effects  have lost weight Remains optimistic for now and folllows with therapy. Has GF in [place] he visits   Going thru therapy . Building esteem    Insomnia :not worse. On cpap machine  Depression severity :improved Motivation: better    Review of Systems  Constitutional: Negative for fever.  Cardiovascular: Negative for palpitations.  Gastrointestinal: Negative for nausea.  Skin: Negative for rash.  Neurological: Negative for tremors.  Psychiatric/Behavioral: Negative for depression, hallucinations and suicidal ideas.     There were no vitals filed for this visit.  Physical Exam  Constitutional: No distress.  Obese  Skin: He is not diaphoretic.    Past Medical History: Reviewed  Past Medical History:  Diagnosis Date  . Anemia   . Diabetes mellitus    controlled  . ED (erectile dysfunction)   . Gout   . Kidney stones    stent, lithotripsy  .  Lymphadenopathy   . Morbid obesity ([place])   . OSA (obstructive sleep apnea)    CPAP-17.5 cm water pressure  . Paroxysmal atrial fibrillation (HCC)   . Pneumonia   . S/P emergency tracheotomy for assistance in breathing [facility])    at age 40  . Testosterone deficiency   . Vitamin D deficiency      Allergies:  Allergies  Allergen Reactions  . Colchicine Shortness Of Breath, Swelling and Itching  . Peach Flavor     "Peaches" " swelling of face"     Current Medications: Current Outpatient Medications on File Prior to Visit  Medication Sig Dispense Refill  . alfuzosin (UROXATRAL) 10 MG 24 hr tablet Take 10 mg by mouth daily.  10  . allopurinol (ZYLOPRIM) 100 MG tablet take 1 tablet by mouth every day 30 tablet 0  . AMBULATORY NON FORMULARY MEDICATION Medication Name: CPAP machine with nasal mask and supplies set to 17.5 cm water pressure.  No humidifier.  Sent to [facility].  See sleep study from 2009 attached.  Dx: OSA. 1 Units PRN.  Marland Kitchen AMBULATORY NON FORMULARY MEDICATION Medication Name: Needs CPAP tubing, supplies. Dx OSA.  09/05/17. 1 vial 0  . AMBULATORY NON FORMULARY MEDICATION Medication Name: Cyclobenzaprine 2% gabapentin 3% Baclofen 2%  Diclofenac 3% Qty# 120 ml Sig: apply small amount topically to effected area 2-3 times a day as directed Fax to [phone] 120 mL 2  . amiodarone (PACERONE) 200 MG tablet Take 1 tablet (200 mg total) by mouth daily. 90 tablet 3  . amphetamine-dextroamphetamine (ADDERALL) 30 MG tablet Take  1 tablet by mouth 2 (two) times daily. 60 tablet 0  . amphetamine-dextroamphetamine (ADDERALL) 30 MG tablet Take 1 tablet by mouth 2 (two) times daily. 60 tablet 0  . aspirin EC 81 MG tablet Take 1 tablet (81 mg total) by mouth daily. (Patient not taking: Reported on 03/03/2018) 90 tablet 3  . blood glucose meter kit and supplies KIT Dispense based on patient and insurance preference. Use up to four times daily as directed. (FOR ICD-9 250.00, 250.01). 1 each 0  .  diltiazem (CARDIZEM CD) 240 MG 24 hr capsule Take 1 capsule (240 mg total) by mouth daily. KEEP OV. 90 capsule 0  . EASY COMFORT PEN NEEDLES 31G X 8 MM MISC use once a day with victoza    . EASY PLUS II GLUCOSE TEST test strip use to test blood sugar once daily 100 each 11  . HYDROcodone-acetaminophen (NORCO) 7.5-325 MG tablet Take 1 tablet by mouth every 8 (eight) hours as needed.  0  . NARCAN 4 MG/0.1ML LIQD nasal spray kit Inject solution in to one nostril for suspected overdose. Repeat in 3-5 minutes if necessary in the other nostril. CALL 911  0  . oxyCODONE (ROXICODONE) 15 MG immediate release tablet   0  . pravastatin (PRAVACHOL) 40 MG tablet Take 1 tablet (40 mg total) by mouth daily. KEEP OV. 30 tablet 6  . Testosterone 1.62 % GEL place 4 pumps on the skin every morning] 150 g 2  . tiZANidine (ZANAFLEX) 4 MG tablet Take 4 mg by mouth once.     Marland Kitchen VICTOZA 18 MG/3ML SOPN Inject 0.3 mLs (1.8 mg total) into the skin daily. (54/1.8=30) 9 mL 3  . zolpidem (AMBIEN CR) 12.5 MG CR tablet Take 12.5 mg by mouth at bedtime.  3   No current facility-administered medications on file prior to visit.         Social History   Socioeconomic History  . Marital status: Married    Spouse name: Not on file  . Number of children: Not on file  . Years of education: Not on file  . Highest education level: Not on file  Occupational History  . Not on file  Social Needs  . Financial resource strain: Not on file  . Food insecurity:    Worry: Not on file    Inability: Not on file  . Transportation needs:    Medical: Not on file    Non-medical: Not on file  Tobacco Use  . Smoking status: Never Smoker  . Smokeless tobacco: Never Used  Substance and Sexual Activity  . Alcohol use: No  . Drug use: No    Comment: prior cocaine abuse (heavy) in his 67s.  denies subsequent use  . Sexual activity: Yes    Partners: Female    Birth control/protection: None    Comment: on full disability, separated, no  regular exercise,walks some, drinks pot of coffee a day.  Lifestyle  . Physical activity:    Days per week: Not on file    Minutes per session: Not on file  . Stress: Not on file  Relationships  . Social connections:    Talks on phone: Not on file    Gets together: Not on file    Attends religious service: Not on file    Active member of club or organization: Not on file    Attends meetings of clubs or organizations: Not on file    Relationship status: Not on file  Other Topics Concern  .  Not on file  Social History Narrative   Drinks one pot of coffee daily.   Regular exercise-no, walks some      Lives in [place] with roommate.   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    Caffeine: Caffeinated Beverages 1 5 hour energy   Family History: Reviewed  Family History  Problem Relation Age of Onset  . Cancer Mother        lung, heavy smoker  . Hypertension Mother   . Hyperlipidemia Mother   . Cancer Father        melanoma  . Aneurysm Father        cardiac  . Hyperlipidemia Father   . Hypertension Father   . Hyperlipidemia Sister   . Hypertension Sister   . Hyperlipidemia Brother   . Hypertension Brother   . Stroke Other     Psychiatric Specialty Examination: Objective: Appearance:  Eye Contact::   Speech: Clear and Coherent and Normal Rate   Volume: Normal   Mood: fair  Affect:  Thought Process: Coherent, Linear and Logical   Orientation: Full   Thought Content: WDL   Suicidal Thoughts: No   Homicidal Thoughts: No   Judgement: Fair   Insight: Good   Psychomotor Activity:   Akathisia: Yes   Handed: Right   Memory-immediate 3/3; recent-2/3  Fund of knowledge-average to above average  Language-Intact  AIMS (if indicated): None   Assets: Communication Skills  Desire for Improvement  Financial Resources/Insurance  Physical Health    Laboratory/X-Ray  Psychological Evaluation(s)   none  Not available    Assessment:  AXIS I   Major Depression, Recurrent . Rule out ADHD , Polysustance dependence in remission. GAD. insomnia  AXIS II  No diagnosis   AXIS III  Past Medical History    Diagnosis  Date    .  Atrial fibrillation     .  Lymphadenopathy     .  Testosterone deficiency     .  Anemia     .  Vitamin d deficiency     .  ED (erectile dysfunction)     .  Morbid obesity     .  OSA (obstructive sleep apnea)       CPAP-17.5 cm water pressure    .  Kidney stones       stent, lithotripsy    .  Lymphadenopathy     .  Diabetes mellitus       controlled    .  Testosterone deficiency     .  Excess or deficiency of vitamin D     .  ED (erectile dysfunction)       AXIS IV  economic problems, educational problems, occupational problems and problems with primary support group   AXIS V  60-moderate symptoms    Treatment Plan/Recommendations:  PLAN:   1. Depression: doing fair. Continue wellbutrin and lexapro    2. Anxiety: not worse. Continue lexapro,avoid ativan since on pain meds. .  3. ADHD :not distracted gets adderall from primary care I discussed the assessment and treatment plan with the patient. The patient was provided an opportunity to ask questions and all were answered. The patient agreed with the plan and demonstrated an understanding of the instructions.   The patient was advised to call back or seek an in-person evaluation if the symptoms worsen or if the condition fails to improve as anticipated. meds refilled wellbutrin and lexapro Fu 4-37mI provided 15  minutes of non-face-to-face time during this encounter.   Merian Capron, M.D.  07/06/2018 1:23 PM

## 2018-07-10 ENCOUNTER — Institutional Professional Consult (permissible substitution): Payer: BLUE CROSS/BLUE SHIELD | Admitting: Plastic Surgery

## 2018-07-13 DIAGNOSIS — F4323 Adjustment disorder with mixed anxiety and depressed mood: Secondary | ICD-10-CM | POA: Diagnosis not present

## 2018-07-16 ENCOUNTER — Other Ambulatory Visit: Payer: Self-pay | Admitting: Family Medicine

## 2018-07-17 ENCOUNTER — Institutional Professional Consult (permissible substitution): Payer: Self-pay | Admitting: Plastic Surgery

## 2018-07-20 DIAGNOSIS — F4323 Adjustment disorder with mixed anxiety and depressed mood: Secondary | ICD-10-CM | POA: Diagnosis not present

## 2018-07-21 ENCOUNTER — Other Ambulatory Visit: Payer: Self-pay

## 2018-07-21 MED ORDER — DILTIAZEM HCL ER COATED BEADS 240 MG PO CP24: 240.0000 mg | ORAL_CAPSULE | Freq: Every day | ORAL | 3 refills | Status: DC

## 2018-07-28 ENCOUNTER — Other Ambulatory Visit: Payer: Self-pay

## 2018-07-28 ENCOUNTER — Encounter: Payer: Self-pay | Admitting: Plastic Surgery

## 2018-07-28 ENCOUNTER — Ambulatory Visit (INDEPENDENT_AMBULATORY_CARE_PROVIDER_SITE_OTHER): Payer: BLUE CROSS/BLUE SHIELD | Admitting: Plastic Surgery

## 2018-07-28 VITALS — BP 121/81 | HR 71 | Temp 98.9°F | Ht 69.0 in | Wt 312.6 lb

## 2018-07-28 DIAGNOSIS — E118 Type 2 diabetes mellitus with unspecified complications: Secondary | ICD-10-CM | POA: Diagnosis not present

## 2018-07-28 DIAGNOSIS — K912 Postsurgical malabsorption, not elsewhere classified: Secondary | ICD-10-CM | POA: Diagnosis not present

## 2018-07-28 DIAGNOSIS — Z903 Acquired absence of stomach [part of]: Secondary | ICD-10-CM

## 2018-07-28 DIAGNOSIS — I480 Paroxysmal atrial fibrillation: Secondary | ICD-10-CM

## 2018-07-28 DIAGNOSIS — I10 Essential (primary) hypertension: Secondary | ICD-10-CM

## 2018-07-28 DIAGNOSIS — Z9884 Bariatric surgery status: Secondary | ICD-10-CM | POA: Diagnosis not present

## 2018-07-28 DIAGNOSIS — M793 Panniculitis, unspecified: Secondary | ICD-10-CM

## 2018-07-28 NOTE — Progress Notes (Addendum)
Patient ID: Rodney Bright, male    DOB: 05/25/1960, 58 y.o.   MRN: [id number]   Chief Complaint  Patient presents with  . Skin Problem    The patient is a 58 yrs old wm here with his sister for evaluation of his pannus.  He complains of rashes, redness and irritation between the creases of his abdomen and peri-inguinal area.  He underwent a Roux-en-Y in 2003 when he was over 600 pounds.  He is now ranging from 300 to 315 pounds.  His diabetes has resolved.  His most recent hemoglobin A1c was 5.7.  He does not need any medication for that.  He does have some cardiac disease and was originally on Xarelto.  He had a severe complication with that and was taken off.  He is not on any other anticoagulation at this time.  He is concerned about the heaviness and pressure of the pannus and the perineal area on his scrotum and penis.  This makes it difficult for him to urinate.  Worse in the summer when he develops the rashes and redness.  He has tried creams and powders that only helped temporarily is around planned weight with in 40 pounds.  He is 5 feet 9 inches tall   Review of Systems  Constitutional: Positive for activity change. Negative for appetite change.  HENT: Negative.  Negative for facial swelling.   Eyes: Negative.   Respiratory: Negative.   Cardiovascular: Negative.  Negative for chest pain.  Gastrointestinal: Negative.  Negative for abdominal pain.  Endocrine: Negative.   Genitourinary: Negative.   Musculoskeletal: Positive for arthralgias, back pain and joint swelling.  Skin: Positive for color change. Negative for wound.  Psychiatric/Behavioral: Negative for self-injury.    Past Medical History:  Diagnosis Date  . Anemia   . Diabetes mellitus    controlled  . ED (erectile dysfunction)   . Gout   . Kidney stones    stent, lithotripsy  . Lymphadenopathy   . Morbid obesity ([place])   . OSA (obstructive sleep apnea)    CPAP-17.5 cm water pressure  . Paroxysmal atrial  fibrillation (HCC)   . Pneumonia   . S/P emergency tracheotomy for assistance in breathing [facility])    at age 58  . Testosterone deficiency   . Vitamin D deficiency     Past Surgical History:  Procedure Laterality Date  . CHOLECYSTECTOMY    . CYSTOSCOPY     retrograde and double J catheter insertion.  Marland Kitchen FINGER SURGERY     ulnar digital nerve and artery right index finger  . GASTRIC BYPASS  2005   600 lbs prior to surgery  . TONSILLECTOMY        Current Outpatient Medications:  .  alfuzosin (UROXATRAL) 10 MG 24 hr tablet, Take 10 mg by mouth daily., Disp: , Rfl: 10 .  allopurinol (ZYLOPRIM) 100 MG tablet, take 1 tablet by mouth every day, Disp: 30 tablet, Rfl: 0 .  AMBULATORY NON FORMULARY MEDICATION, Medication Name: CPAP machine with nasal mask and supplies set to 17.5 cm water pressure.  No humidifier.  Sent to [facility].  See sleep study from 2009 attached.  Dx: OSA., Disp: 1 Units, Rfl: PRN. Marland Kitchen  AMBULATORY NON FORMULARY MEDICATION, Medication Name: Needs CPAP tubing, supplies. Dx OSA.  09/05/17., Disp: 1 vial, Rfl: 0 .  AMBULATORY NON FORMULARY MEDICATION, Medication Name: Cyclobenzaprine 2% gabapentin 3% Baclofen 2%  Diclofenac 3% Qty# 120 ml Sig: apply small amount topically to effected  area 2-3 times a day as directed Fax to [phone], Disp: 120 mL, Rfl: 2 .  amiodarone (PACERONE) 200 MG tablet, Take 1 tablet (200 mg total) by mouth daily., Disp: 90 tablet, Rfl: 3 .  amphetamine-dextroamphetamine (ADDERALL) 30 MG tablet, Take 1 tablet by mouth 2 (two) times daily., Disp: 60 tablet, Rfl: 0 .  amphetamine-dextroamphetamine (ADDERALL) 30 MG tablet, Take 1 tablet by mouth 2 (two) times daily., Disp: 60 tablet, Rfl: 0 .  aspirin EC 81 MG tablet, Take 1 tablet (81 mg total) by mouth daily., Disp: 90 tablet, Rfl: 3 .  blood glucose meter kit and supplies KIT, Dispense based on patient and insurance preference. Use up to four times daily as directed. (FOR ICD-9 250.00, 250.01)., Disp: 1  each, Rfl: 0 .  buPROPion (WELLBUTRIN XL) 300 MG 24 hr tablet, Take 1 tablet (300 mg total) by mouth daily., Disp: 30 tablet, Rfl: 2 .  buPROPion (WELLBUTRIN) 100 MG tablet, Take 1 tablet (100 mg total) by mouth daily. Add to Wellbutrin '300mg'$  already taking., Disp: 30 tablet, Rfl: 2 .  diltiazem (CARDIZEM CD) 240 MG 24 hr capsule, Take 1 capsule (240 mg total) by mouth daily. KEEP OV., Disp: 90 capsule, Rfl: 3 .  EASY COMFORT PEN NEEDLES 31G X 8 MM MISC, use once a day with victoza, Disp: , Rfl:  .  EASY PLUS II GLUCOSE TEST test strip, use to test blood sugar once daily, Disp: 100 each, Rfl: 11 .  escitalopram (LEXAPRO) 20 MG tablet, Take '20mg'$  tablets twice a day., Disp: 60 tablet, Rfl: 2 .  HYDROcodone-acetaminophen (NORCO) 7.5-325 MG tablet, Take 1 tablet by mouth every 8 (eight) hours as needed., Disp: , Rfl: 0 .  NARCAN 4 MG/0.1ML LIQD nasal spray kit, Inject solution in to one nostril for suspected overdose. Repeat in 3-5 minutes if necessary in the other nostril. CALL 911, Disp: , Rfl: 0 .  oxyCODONE (ROXICODONE) 15 MG immediate release tablet, , Disp: , Rfl: 0 .  pravastatin (PRAVACHOL) 40 MG tablet, Take 1 tablet (40 mg total) by mouth daily. KEEP OV., Disp: 30 tablet, Rfl: 6 .  Testosterone 1.62 % GEL, place 4 pumps on the skin every morning], Disp: 150 g, Rfl: 2 .  tiZANidine (ZANAFLEX) 4 MG tablet, Take 4 mg by mouth once. , Disp: , Rfl:  .  VICTOZA 18 MG/3ML SOPN, Inject 0.3 mLs (1.8 mg total) into the skin daily. (54/1.8=30), Disp: 9 mL, Rfl: 3 .  zolpidem (AMBIEN CR) 12.5 MG CR tablet, Take 12.5 mg by mouth at bedtime., Disp: , Rfl: 3   Objective:   Vitals:   07/28/18 1113  BP: 121/81  Pulse: 71  Temp: 98.9 F (37.2 C)  SpO2: 95%    Physical Exam Vitals signs reviewed.  Constitutional:      Appearance: Normal appearance.  HENT:     Head: Normocephalic and atraumatic.     Nose: Nose normal.     Mouth/Throat:     Mouth: Mucous membranes are moist.  Eyes:      Extraocular Movements: Extraocular movements intact.  Cardiovascular:     Rate and Rhythm: Normal rate.  Pulmonary:     Effort: Pulmonary effort is normal. No respiratory distress.     Breath sounds: No wheezing.  Abdominal:     General: Abdomen is flat. There is no distension.     Palpations: There is no mass.     Tenderness: There is no abdominal tenderness.     Hernia: No  hernia is present.  Musculoskeletal:        General: No swelling.  Skin:    General: Skin is warm.  Neurological:     General: No focal deficit present.     Mental Status: He is alert.  Psychiatric:        Mood and Affect: Mood normal.        Behavior: Behavior normal.        Thought Content: Thought content normal.     Assessment & Plan:  Bariatric surgery status  MORBID OBESITY  Postgastrectomy malabsorption  Controlled type 2 diabetes mellitus with complication, without long-term current use of insulin (HCC)  Paroxysmal atrial fibrillation (HCC)  Essential hypertension, benign  Panniculitis Panniculectomy recommended.  Pictures obtained and placed in the chart with patient permission.  Discussed the course of the operation the risks and the complications over a 30-NMMHWK period of time.  The patient is eager to move forward.  Lane, DO

## 2018-07-31 ENCOUNTER — Other Ambulatory Visit: Payer: Self-pay | Admitting: Family Medicine

## 2018-07-31 MED ORDER — AMPHETAMINE-DEXTROAMPHETAMINE 30 MG PO TABS: 30.0000 mg | ORAL_TABLET | Freq: Two times a day (BID) | ORAL | 0 refills | Status: DC

## 2018-07-31 NOTE — Telephone Encounter (Signed)
OK 5 days sent.

## 2018-07-31 NOTE — Telephone Encounter (Signed)
Pt advised. Verbalized understanding.

## 2018-07-31 NOTE — Telephone Encounter (Signed)
He absolutely needs to get his labs done.  With his testosterone we have not had a level since August.  I cannot safely continue to refill medications until he can go get his blood work done.

## 2018-07-31 NOTE — Telephone Encounter (Signed)
Patient advised of recommendations. He states he will go to the lab on Monday. He would like a 5 day supply so he doesn't have to stop the medication.

## 2018-08-03 DIAGNOSIS — I10 Essential (primary) hypertension: Secondary | ICD-10-CM | POA: Diagnosis not present

## 2018-08-03 DIAGNOSIS — E118 Type 2 diabetes mellitus with unspecified complications: Secondary | ICD-10-CM | POA: Diagnosis not present

## 2018-08-03 DIAGNOSIS — F4323 Adjustment disorder with mixed anxiety and depressed mood: Secondary | ICD-10-CM | POA: Diagnosis not present

## 2018-08-03 DIAGNOSIS — E291 Testicular hypofunction: Secondary | ICD-10-CM | POA: Diagnosis not present

## 2018-08-03 DIAGNOSIS — Z0000 Encounter for general adult medical examination without abnormal findings: Secondary | ICD-10-CM | POA: Diagnosis not present

## 2018-08-04 LAB — COMPLETE METABOLIC PANEL WITH GFR
AG Ratio: 1.7 (calc) (ref 1.0–2.5)
ALT: 27 U/L (ref 9–46)
AST: 33 U/L (ref 10–35)
Albumin: 4 g/dL (ref 3.6–5.1)
Alkaline phosphatase (APISO): 65 U/L (ref 35–144)
BUN/Creatinine Ratio: 15 (calc) (ref 6–22)
BUN: 20 mg/dL (ref 7–25)
CO2: 30 mmol/L (ref 20–32)
Calcium: 9.1 mg/dL (ref 8.6–10.3)
Chloride: 107 mmol/L (ref 98–110)
Creat: 1.3 mg/dL — ABNORMAL HIGH (ref 0.70–1.25)
GFR, Est African American: 69 mL/min/{1.73_m2} (ref >=60)
GFR, Est Non African American: 59 mL/min/{1.73_m2} — ABNORMAL LOW (ref >=60)
Globulin: 2.4 g/dL (calc) (ref 1.9–3.7)
Glucose, Bld: 107 mg/dL — ABNORMAL HIGH (ref 65–99)
Potassium: 4.3 mmol/L (ref 3.5–5.3)
Sodium: 141 mmol/L (ref 135–146)
Total Bilirubin: 0.4 mg/dL (ref 0.2–1.2)
Total Protein: 6.4 g/dL (ref 6.1–8.1)

## 2018-08-04 LAB — LIPID PANEL
Cholesterol: 117 mg/dL (ref <200)
HDL: 46 mg/dL (ref >=40)
LDL Cholesterol (Calc): 57 mg/dL (calc)
Non-HDL Cholesterol (Calc): 71 mg/dL (calc) (ref <130)
Total CHOL/HDL Ratio: 2.5 (calc) (ref <5.0)
Triglycerides: 59 mg/dL (ref <150)

## 2018-08-04 LAB — CBC
HCT: 39.5 % (ref 38.5–50.0)
Hemoglobin: 12.5 g/dL — ABNORMAL LOW (ref 13.2–17.1)
MCH: 27.2 pg (ref 27.0–33.0)
MCHC: 31.6 g/dL — ABNORMAL LOW (ref 32.0–36.0)
MCV: 85.9 fL (ref 80.0–100.0)
MPV: 10.8 fL (ref 7.5–12.5)
Platelets: 203 10*3/uL (ref 140–400)
RBC: 4.6 10*6/uL (ref 4.20–5.80)
RDW: 14.7 % (ref 11.0–15.0)
WBC: 4.7 10*3/uL (ref 3.8–10.8)

## 2018-08-04 LAB — MICROALBUMIN, URINE: Microalb, Ur: 0.5 mg/dL

## 2018-08-04 LAB — PSA: PSA: 1.2 ng/mL (ref <=4.0)

## 2018-08-04 LAB — TSH: TSH: 3.14 mIU/L (ref 0.40–4.50)

## 2018-08-04 LAB — TESTOSTERONE: Testosterone: 110 ng/dL — ABNORMAL LOW (ref 250–827)

## 2018-08-05 ENCOUNTER — Encounter: Payer: Self-pay | Admitting: Family Medicine

## 2018-08-05 ENCOUNTER — Ambulatory Visit (INDEPENDENT_AMBULATORY_CARE_PROVIDER_SITE_OTHER): Payer: BLUE CROSS/BLUE SHIELD | Admitting: Family Medicine

## 2018-08-05 VITALS — BP 134/80 | HR 73 | Temp 98.1°F | Ht 69.0 in | Wt 317.0 lb

## 2018-08-05 DIAGNOSIS — E291 Testicular hypofunction: Secondary | ICD-10-CM | POA: Diagnosis not present

## 2018-08-05 DIAGNOSIS — Z794 Long term (current) use of insulin: Secondary | ICD-10-CM

## 2018-08-05 DIAGNOSIS — F988 Other specified behavioral and emotional disorders with onset usually occurring in childhood and adolescence: Secondary | ICD-10-CM

## 2018-08-05 DIAGNOSIS — I10 Essential (primary) hypertension: Secondary | ICD-10-CM

## 2018-08-05 DIAGNOSIS — M1A09X0 Idiopathic chronic gout, multiple sites, without tophus (tophi): Secondary | ICD-10-CM

## 2018-08-05 DIAGNOSIS — E118 Type 2 diabetes mellitus with unspecified complications: Secondary | ICD-10-CM

## 2018-08-05 LAB — POCT GLYCOSYLATED HEMOGLOBIN (HGB A1C): Hemoglobin A1C: 5.5 % (ref 4.0–5.6)

## 2018-08-05 MED ORDER — AMPHETAMINE-DEXTROAMPHETAMINE 30 MG PO TABS: 30.0000 mg | ORAL_TABLET | Freq: Two times a day (BID) | ORAL | 0 refills | Status: DC

## 2018-08-05 MED ORDER — AMPHETAMINE-DEXTROAMPHETAMINE 30 MG PO TABS: 1.0000 | ORAL_TABLET | Freq: Two times a day (BID) | ORAL | 0 refills | Status: DC

## 2018-08-05 NOTE — Progress Notes (Signed)
Virtual Visit via Video Note  I connected with Rodney Bright on 08/05/18 at  9:10 AM EDT by a video enabled telemedicine application and verified that I am speaking with the correct person using two identifiers.   I discussed the limitations of evaluation and management by telemedicine and the availability of in person appointments. The patient expressed understanding and agreed to proceed.  Subjective:    CC: F/U ADHD and DM  HPI: ADHD - Reports symptoms are well controlled on current regime. Denies any problems with insomnia, chest pain, palpitations, or SOB.    Diabetes - no hypoglycemic events. No wounds or sores that are not healing well. No increased thirst or urination. Checking glucose at home. Taking medications as prescribed without any side effects.  Hypertension- Pt denies chest pain, SOB, dizziness, or heart palpitations.  Taking meds as directed w/o problems.  Denies medication side effects.    He also wanted to discuss pain medications.  He normally gets his medication from Dr. Estella Husk.  He had been walking more and doing some gardening so had take some of his oxycodone more frequently and when he did his drug screen he was negative for this. He is now beeng referred to [facility] but is worried will be without his oxycodone for a month.    Hypogonadism - last testosterone check a couple of days ago. Blood draw was about 30 min after applications.    Lab Results  Component Value Date   TESTOSTERONE 110 (L) 08/03/2018      Past medical history, Surgical history, Family history not pertinant except as noted below, Social history, Allergies, and medications have been entered into the medical record, reviewed, and corrections made.   Review of Systems: No fevers, chills, night sweats, weight loss, chest pain, or shortness of breath.   Objective:    General: Speaking clearly in complete sentences without any shortness of breath.  Alert and oriented x3.  Normal judgment.  No apparent acute distress.    Impression and Recommendations:    ADHD - STable. Happy with regimen. RF medication for 90 days. F/U in 3-4 months.     Diabetes - well controlled on Victoza only.A1C is fantastic. REcheck in 6 mo.   Hypertension- Pt denies chest pain, SOB, dizziness, or heart palpitations.  Taking meds as directed w/o problems.  Denies medication side effects.    Chronic pain - he is transitioning care to [facility]. I will not fill any pain medications. Suggest he call Dr. Gertie Exon office.    Hypogonadism - plan to recheck this summer. Time about 3-4 hours after application for more true reading of testosterone level.  Continue with 4 pumps currently.       I discussed the assessment and treatment plan with the patient. The patient was provided an opportunity to ask questions and all were answered. The patient agreed with the plan and demonstrated an understanding of the instructions.   The patient was advised to call back or seek an in-person evaluation if the symptoms worsen or if the condition fails to improve as anticipated.   Nani Gasser, MD

## 2018-08-10 DIAGNOSIS — F4323 Adjustment disorder with mixed anxiety and depressed mood: Secondary | ICD-10-CM | POA: Diagnosis not present

## 2018-08-12 ENCOUNTER — Other Ambulatory Visit: Payer: Self-pay | Admitting: Family Medicine

## 2018-08-26 ENCOUNTER — Other Ambulatory Visit: Payer: Self-pay | Admitting: Family Medicine

## 2018-08-26 DIAGNOSIS — E291 Testicular hypofunction: Secondary | ICD-10-CM

## 2018-08-26 DIAGNOSIS — G8929 Other chronic pain: Secondary | ICD-10-CM | POA: Diagnosis not present

## 2018-08-26 DIAGNOSIS — Z79899 Other long term (current) drug therapy: Secondary | ICD-10-CM | POA: Diagnosis not present

## 2018-08-26 DIAGNOSIS — M545 Low back pain: Secondary | ICD-10-CM | POA: Diagnosis not present

## 2018-09-02 ENCOUNTER — Telehealth: Payer: Self-pay

## 2018-09-02 NOTE — Telephone Encounter (Signed)
Rodney Bright called and states Dr Linford Arnold is out of network with his new insurance. He wants Dr Linford Arnold to fill out a form so he can continue to see her . I advised him to call back with the information on the form. Such as where to find the form and where would we send the form once finished.

## 2018-09-10 ENCOUNTER — Telehealth: Payer: Self-pay

## 2018-09-10 DIAGNOSIS — G8929 Other chronic pain: Secondary | ICD-10-CM | POA: Insufficient documentation

## 2018-09-10 DIAGNOSIS — Z86711 Personal history of pulmonary embolism: Secondary | ICD-10-CM | POA: Insufficient documentation

## 2018-09-10 DIAGNOSIS — R7989 Other specified abnormal findings of blood chemistry: Secondary | ICD-10-CM | POA: Insufficient documentation

## 2018-09-10 DIAGNOSIS — F4323 Adjustment disorder with mixed anxiety and depressed mood: Secondary | ICD-10-CM | POA: Insufficient documentation

## 2018-09-10 DIAGNOSIS — M542 Cervicalgia: Secondary | ICD-10-CM | POA: Diagnosis not present

## 2018-09-10 DIAGNOSIS — F988 Other specified behavioral and emotional disorders with onset usually occurring in childhood and adolescence: Secondary | ICD-10-CM | POA: Diagnosis not present

## 2018-09-10 DIAGNOSIS — M545 Low back pain, unspecified: Secondary | ICD-10-CM | POA: Insufficient documentation

## 2018-09-10 DIAGNOSIS — Z8719 Personal history of other diseases of the digestive system: Secondary | ICD-10-CM | POA: Insufficient documentation

## 2018-09-10 DIAGNOSIS — G4709 Other insomnia: Secondary | ICD-10-CM | POA: Insufficient documentation

## 2018-09-10 DIAGNOSIS — G5600 Carpal tunnel syndrome, unspecified upper limb: Secondary | ICD-10-CM | POA: Insufficient documentation

## 2018-09-10 DIAGNOSIS — E782 Mixed hyperlipidemia: Secondary | ICD-10-CM | POA: Insufficient documentation

## 2018-09-10 DIAGNOSIS — F1120 Opioid dependence, uncomplicated: Secondary | ICD-10-CM | POA: Insufficient documentation

## 2018-09-10 NOTE — Telephone Encounter (Addendum)
Received VM on Triage line from Dr Wilmer Floor with [facility]. States patient presented there to establish care and he wanted to speak with Dr Linford Arnold about his past medical history.   Please call Dr Wilmer Floor back when able  Work: [phone] Cell: [phone]

## 2018-09-13 NOTE — Telephone Encounter (Signed)
Called Dr. Loa Socks and answered questions.

## 2018-09-15 DIAGNOSIS — M545 Low back pain: Secondary | ICD-10-CM | POA: Diagnosis not present

## 2018-09-15 DIAGNOSIS — Z79899 Other long term (current) drug therapy: Secondary | ICD-10-CM | POA: Diagnosis not present

## 2018-09-15 DIAGNOSIS — G8929 Other chronic pain: Secondary | ICD-10-CM | POA: Diagnosis not present

## 2018-09-22 DIAGNOSIS — M793 Panniculitis, unspecified: Secondary | ICD-10-CM | POA: Diagnosis not present

## 2018-09-25 ENCOUNTER — Telehealth: Payer: Self-pay

## 2018-09-25 DIAGNOSIS — G609 Hereditary and idiopathic neuropathy, unspecified: Secondary | ICD-10-CM

## 2018-09-25 MED ORDER — AMBULATORY NON FORMULARY MEDICATION: 0 refills | Status: DC

## 2018-09-25 NOTE — Telephone Encounter (Signed)
Printed and in Dr Gardiner Ramus basket to be signed and faxed Monday

## 2018-09-25 NOTE — Telephone Encounter (Addendum)
Received call from [facility] requesting new RX for his compounded cream (cyclobenzaprine, gabapentin, baclofen, diclofenac). Advised [facility] that patient is no longer a patient here and RX needs to come from new PCP.  Patient called me also and stated that this RX was supposed to be sent in during his appt in March, therefore he feels we should write him a refill. Please advise    Also- Dr Madilyn Fireman, I also see in his chart where he has a future dated Adderall RX to be filled 10/03/18.. do you want me to call pharmacy and discontinue this?

## 2018-09-25 NOTE — Telephone Encounter (Signed)
OK to refill compounded cream.  Ok to leave June refill on file.

## 2018-09-29 DIAGNOSIS — M542 Cervicalgia: Secondary | ICD-10-CM | POA: Diagnosis not present

## 2018-09-29 DIAGNOSIS — Z79899 Other long term (current) drug therapy: Secondary | ICD-10-CM | POA: Diagnosis not present

## 2018-09-29 DIAGNOSIS — M792 Neuralgia and neuritis, unspecified: Secondary | ICD-10-CM | POA: Diagnosis not present

## 2018-09-29 DIAGNOSIS — G894 Chronic pain syndrome: Secondary | ICD-10-CM | POA: Diagnosis not present

## 2018-10-07 DIAGNOSIS — G8929 Other chronic pain: Secondary | ICD-10-CM | POA: Diagnosis not present

## 2018-10-07 DIAGNOSIS — Z79899 Other long term (current) drug therapy: Secondary | ICD-10-CM | POA: Diagnosis not present

## 2018-10-07 DIAGNOSIS — M545 Low back pain: Secondary | ICD-10-CM | POA: Diagnosis not present

## 2018-10-22 ENCOUNTER — Other Ambulatory Visit: Payer: Self-pay | Admitting: Family Medicine

## 2018-10-22 DIAGNOSIS — E291 Testicular hypofunction: Secondary | ICD-10-CM

## 2018-10-22 DIAGNOSIS — E119 Type 2 diabetes mellitus without complications: Secondary | ICD-10-CM

## 2018-10-30 ENCOUNTER — Other Ambulatory Visit: Payer: Self-pay | Admitting: Family Medicine

## 2018-11-03 ENCOUNTER — Other Ambulatory Visit: Payer: Self-pay | Admitting: Family Medicine

## 2018-11-09 DIAGNOSIS — M5417 Radiculopathy, lumbosacral region: Secondary | ICD-10-CM | POA: Diagnosis not present

## 2018-11-09 DIAGNOSIS — M545 Low back pain: Secondary | ICD-10-CM | POA: Diagnosis not present

## 2018-11-09 DIAGNOSIS — M5412 Radiculopathy, cervical region: Secondary | ICD-10-CM | POA: Diagnosis not present

## 2018-11-09 DIAGNOSIS — G8929 Other chronic pain: Secondary | ICD-10-CM | POA: Diagnosis not present

## 2018-12-17 DIAGNOSIS — M5412 Radiculopathy, cervical region: Secondary | ICD-10-CM | POA: Diagnosis not present

## 2018-12-17 DIAGNOSIS — M5417 Radiculopathy, lumbosacral region: Secondary | ICD-10-CM | POA: Diagnosis not present

## 2018-12-17 DIAGNOSIS — M545 Low back pain: Secondary | ICD-10-CM | POA: Diagnosis not present

## 2018-12-17 DIAGNOSIS — Z79899 Other long term (current) drug therapy: Secondary | ICD-10-CM | POA: Diagnosis not present

## 2018-12-17 DIAGNOSIS — G8929 Other chronic pain: Secondary | ICD-10-CM | POA: Diagnosis not present

## 2018-12-21 ENCOUNTER — Other Ambulatory Visit: Payer: Self-pay

## 2018-12-21 MED ORDER — AMIODARONE HCL 200 MG PO TABS: 200.0000 mg | ORAL_TABLET | Freq: Every day | ORAL | 0 refills | Status: DC

## 2019-01-04 ENCOUNTER — Other Ambulatory Visit: Payer: Self-pay | Admitting: Family Medicine

## 2019-01-27 ENCOUNTER — Other Ambulatory Visit: Payer: Self-pay | Admitting: Family Medicine

## 2019-01-27 ENCOUNTER — Other Ambulatory Visit: Payer: Self-pay

## 2019-01-27 ENCOUNTER — Other Ambulatory Visit: Payer: Self-pay | Admitting: *Deleted

## 2019-02-04 DIAGNOSIS — M545 Low back pain: Secondary | ICD-10-CM | POA: Diagnosis not present

## 2019-02-04 DIAGNOSIS — M5417 Radiculopathy, lumbosacral region: Secondary | ICD-10-CM | POA: Diagnosis not present

## 2019-02-04 DIAGNOSIS — Z79899 Other long term (current) drug therapy: Secondary | ICD-10-CM | POA: Diagnosis not present

## 2019-02-04 DIAGNOSIS — M5412 Radiculopathy, cervical region: Secondary | ICD-10-CM | POA: Diagnosis not present

## 2019-02-04 DIAGNOSIS — G8929 Other chronic pain: Secondary | ICD-10-CM | POA: Diagnosis not present

## 2019-02-11 DIAGNOSIS — G4709 Other insomnia: Secondary | ICD-10-CM | POA: Diagnosis not present

## 2019-02-11 DIAGNOSIS — R7989 Other specified abnormal findings of blood chemistry: Secondary | ICD-10-CM | POA: Diagnosis not present

## 2019-02-11 DIAGNOSIS — E1169 Type 2 diabetes mellitus with other specified complication: Secondary | ICD-10-CM | POA: Diagnosis not present

## 2019-02-11 DIAGNOSIS — F988 Other specified behavioral and emotional disorders with onset usually occurring in childhood and adolescence: Secondary | ICD-10-CM | POA: Diagnosis not present

## 2019-02-24 ENCOUNTER — Other Ambulatory Visit: Payer: Self-pay | Admitting: Family Medicine

## 2019-03-10 ENCOUNTER — Other Ambulatory Visit: Payer: Self-pay

## 2019-03-10 MED ORDER — AMIODARONE HCL 200 MG PO TABS: 200.0000 mg | ORAL_TABLET | Freq: Every day | ORAL | 0 refills | Status: DC

## 2019-03-15 ENCOUNTER — Other Ambulatory Visit: Payer: Self-pay

## 2019-03-15 MED ORDER — PRAVASTATIN SODIUM 40 MG PO TABS: 40.0000 mg | ORAL_TABLET | Freq: Every day | ORAL | 0 refills | Status: DC

## 2019-04-12 DIAGNOSIS — Z79899 Other long term (current) drug therapy: Secondary | ICD-10-CM | POA: Diagnosis not present

## 2019-04-12 DIAGNOSIS — Z1159 Encounter for screening for other viral diseases: Secondary | ICD-10-CM | POA: Diagnosis not present

## 2019-04-12 DIAGNOSIS — M545 Low back pain: Secondary | ICD-10-CM | POA: Diagnosis not present

## 2019-04-12 DIAGNOSIS — G8929 Other chronic pain: Secondary | ICD-10-CM | POA: Diagnosis not present

## 2019-04-12 DIAGNOSIS — M5412 Radiculopathy, cervical region: Secondary | ICD-10-CM | POA: Diagnosis not present

## 2019-04-12 DIAGNOSIS — R0602 Shortness of breath: Secondary | ICD-10-CM | POA: Diagnosis not present

## 2019-04-12 DIAGNOSIS — M5417 Radiculopathy, lumbosacral region: Secondary | ICD-10-CM | POA: Diagnosis not present

## 2019-04-19 ENCOUNTER — Encounter (HOSPITAL_COMMUNITY): Payer: Self-pay | Admitting: Psychiatry

## 2019-04-19 ENCOUNTER — Ambulatory Visit (INDEPENDENT_AMBULATORY_CARE_PROVIDER_SITE_OTHER): Payer: BC Managed Care – PPO | Admitting: Psychiatry

## 2019-04-19 DIAGNOSIS — F5102 Adjustment insomnia: Secondary | ICD-10-CM

## 2019-04-19 DIAGNOSIS — F411 Generalized anxiety disorder: Secondary | ICD-10-CM

## 2019-04-19 DIAGNOSIS — F331 Major depressive disorder, recurrent, moderate: Secondary | ICD-10-CM

## 2019-04-19 DIAGNOSIS — F909 Attention-deficit hyperactivity disorder, unspecified type: Secondary | ICD-10-CM | POA: Diagnosis not present

## 2019-04-19 MED ORDER — BUPROPION HCL 100 MG PO TABS: 100.0000 mg | ORAL_TABLET | Freq: Every day | ORAL | 3 refills | Status: DC

## 2019-04-19 MED ORDER — ESCITALOPRAM OXALATE 20 MG PO TABS: ORAL_TABLET | ORAL | 3 refills | Status: DC

## 2019-04-19 MED ORDER — BUPROPION HCL ER (XL) 300 MG PO TB24: 300.0000 mg | ORAL_TABLET | Freq: Every day | ORAL | 3 refills | Status: DC

## 2019-04-19 NOTE — Progress Notes (Signed)
Patient ID: Rodney Bright, male   DOB: 1960/12/31, 59 y.o.   MRN: [id number]   [place] Follow-up tele medicine appointment  Rodney Bright 1961-01-19  Date: 04/19/2019  HPI Comments: Rodney Bright is a 59 y/o male with a past psychiatric history significant for symptoms of depression and anxiety. The patient was referred for psychiatric services for medication management.   I connected with Rodney Bright on 04/19/19 at  2:30 PM EST by telephone and verified that I am speaking with the correct person using two identifiers.  I discussed the limitations, risks, security and privacy concerns of performing an evaluation and management service by telephone and the availability of in person appointments. I also discussed with the patient that there may be a patient responsible charge related to this service. The patient expressed understanding and agreed to proceed.  Not seen for 9 months. Says change of insurance and now back so returning for appointment chjanged pain clinic and states in discussion to get back on ativan with their group Also on adderall from primary care  Tolerating lexapro and wellbutrin for depression Stays and helps sister out.   Building esteem but general worries related to pandemic   Insomnia :not worse. On cpap machine  Depression severity  Fair Motivation: better to baseline    Review of Systems  Cardiovascular: Negative for palpitations.  Psychiatric/Behavioral: Negative for depression, hallucinations and suicidal ideas.     There were no vitals filed for this visit.  Physical Exam  Constitutional: No distress.  Obese  Skin: He is not diaphoretic.    Past Medical History: Reviewed  Past Medical History:  Diagnosis Date  . Anemia   . Diabetes mellitus    controlled  . ED (erectile dysfunction)   . Gout   . Kidney stones    stent, lithotripsy  . Lymphadenopathy   . Morbid obesity ([place])   . OSA (obstructive sleep apnea)     CPAP-17.5 cm water pressure  . Paroxysmal atrial fibrillation (HCC)   . Pneumonia   . S/P emergency tracheotomy for assistance in breathing [facility])    at age 53  . Testosterone deficiency   . Vitamin D deficiency      Allergies:  Allergies  Allergen Reactions  . Colchicine Shortness Of Breath, Swelling and Itching  . Peach Flavor     "Peaches" " swelling of face"     Current Medications: Current Outpatient Medications on File Prior to Visit  Medication Sig Dispense Refill  . alfuzosin (UROXATRAL) 10 MG 24 hr tablet Take 10 mg by mouth daily.  10  . allopurinol (ZYLOPRIM) 100 MG tablet Take 1 tablet (100 mg total) by mouth daily. [place].APPOINTMENT AND LABS REQUIRED FOR REFILLS. 30 tablet 0  . AMBULATORY NON FORMULARY MEDICATION Medication Name: CPAP machine with nasal mask and supplies set to 17.5 cm water pressure.  No humidifier.  Sent to [facility].  See sleep study from 2009 attached.  Dx: OSA. 1 Units PRN.  Marland Kitchen AMBULATORY NON FORMULARY MEDICATION Medication Name: Needs CPAP tubing, supplies. Dx OSA.  09/05/17. 1 vial 0  . AMBULATORY NON FORMULARY MEDICATION Medication Name: Cyclobenzaprine 2% gabapentin 3% Baclofen 2%  Diclofenac 3% Qty# 120 ml Sig: apply small amount topically to effected area 2-3 times a day as directed Fax to [phone] 120 mL 0  . amiodarone (PACERONE) 200 MG tablet Take 1 tablet (200 mg total) by mouth daily. OFFICE VISIT NEEDED FOR FUTURE REFILLS 30 tablet 0  .  amphetamine-dextroamphetamine (ADDERALL) 30 MG tablet Take 1 tablet by mouth 2 (two) times daily. 60 tablet 0  . amphetamine-dextroamphetamine (ADDERALL) 30 MG tablet Take 1 tablet by mouth 2 (two) times daily. 60 tablet 0  . amphetamine-dextroamphetamine (ADDERALL) 30 MG tablet Take 1 tablet by mouth 2 (two) times daily. 60 tablet 0  . aspirin EC 81 MG tablet Take 1 tablet (81 mg total) by mouth daily. 90 tablet 3  . blood glucose meter kit and supplies KIT Dispense based on patient and  insurance preference. Use up to four times daily as directed. (FOR ICD-9 250.00, 250.01). 1 each 0  . diltiazem (CARDIZEM CD) 240 MG 24 hr capsule Take 1 capsule (240 mg total) by mouth daily. KEEP OV. 90 capsule 3  . diltiazem (CARDIZEM CD) 300 MG 24 hr capsule Take 1 capsule (240 mg total) by mouth daily. [place] Office Visit.    Marland Kitchen EASY COMFORT PEN NEEDLES 31G X 8 MM MISC use once a day with victoza    . EASY PLUS II GLUCOSE TEST test strip use to test blood sugar once daily 100 each 11  . gabapentin (NEURONTIN) 300 MG capsule Take 300 mg by mouth at bedtime.    Marland Kitchen NARCAN 4 MG/0.1ML LIQD nasal spray kit Inject solution in to one nostril for suspected overdose. Repeat in 3-5 minutes if necessary in the other nostril. CALL 911  0  . oxyCODONE (ROXICODONE) 15 MG immediate release tablet   0  . pravastatin (PRAVACHOL) 40 MG tablet Take 1 tablet (40 mg total) by mouth daily. KEEP OV. 30 tablet 0  . Testosterone 1.62 % GEL place 4 pumps on the skin every morning] 150 g 2  . tiZANidine (ZANAFLEX) 4 MG tablet Take 4 mg by mouth once.     Marland Kitchen VICTOZA 18 MG/3ML SOPN Inject 0.3 mLs (1.8 mg total) into the skin daily. (54/1.8=30) 9 mL 3  . zolpidem (AMBIEN CR) 12.5 MG CR tablet Take 12.5 mg by mouth at bedtime.  3   No current facility-administered medications on file prior to visit.        Social History   Socioeconomic History  . Marital status: Married    Spouse name: Not on file  . Number of children: Not on file  . Years of education: Not on file  . Highest education level: Not on file  Occupational History  . Not on file  Tobacco Use  . Smoking status: Never Smoker  . Smokeless tobacco: Never Used  Substance and Sexual Activity  . Alcohol use: No  . Drug use: No    Comment: prior cocaine abuse (heavy) in his 4s.  denies subsequent use  . Sexual activity: Yes    Partners: Female    Birth control/protection: None    Comment: on full disability, separated, no regular exercise,walks some,  drinks pot of coffee a day.  Other Topics Concern  . Not on file  Social History Narrative   Drinks one pot of coffee daily.   Regular exercise-no, walks some      Lives in [place] with roommate.   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   Social Determinants of Health   Financial Resource Strain:   . Difficulty of Paying Living Expenses: Not on file  Food Insecurity:   . Worried About Charity fundraiser in the Last Year: Not on file  . Ran Out of Food in the Last Year: Not on file  Transportation  Needs:   . Lack of Transportation (Medical): Not on file  . Lack of Transportation (Non-Medical): Not on file  Physical Activity:   . Days of Exercise per Week: Not on file  . Minutes of Exercise per Session: Not on file  Stress:   . Feeling of Stress : Not on file  Social Connections:   . Frequency of Communication with Friends and Family: Not on file  . Frequency of Social Gatherings with Friends and Family: Not on file  . Attends Religious Services: Not on file  . Active Member of Clubs or Organizations: Not on file  . Attends Archivist Meetings: Not on file  . Marital Status: Not on file    Caffeine: Caffeinated Beverages 1 5 hour energy   Family History: Reviewed  Family History  Problem Relation Age of Onset  . Cancer Mother        lung, heavy smoker  . Hypertension Mother   . Hyperlipidemia Mother   . Cancer Father        melanoma  . Aneurysm Father        cardiac  . Hyperlipidemia Father   . Hypertension Father   . Hyperlipidemia Sister   . Hypertension Sister   . Hyperlipidemia Brother   . Hypertension Brother   . Stroke Other     Psychiatric Specialty Examination: Objective: Appearance:  Eye Contact::   Speech: Clear and Coherent and Normal Rate   Volume: Normal   Mood: fair  Affect:  Thought Process: Coherent, Linear and Logical   Orientation: Full   Thought Content: WDL   Suicidal Thoughts: No    Homicidal Thoughts: No   Judgement: Fair   Insight: Good   Psychomotor Activity:   Akathisia: Yes   Handed: Right   Memory-immediate 3/3; recent-2/3  Fund of knowledge-average to above average  Language-Intact  AIMS (if indicated): None   Assets: Communication Skills  Desire for Improvement  Financial Resources/Insurance  Physical Health    Laboratory/X-Ray  Psychological Evaluation(s)   none  Not available    Assessment:  AXIS I  Major Depression, Recurrent . Rule out ADHD , Polysustance dependence in remission. GAD. insomnia  AXIS II  No diagnosis   AXIS III  Past Medical History    Diagnosis  Date    .  Atrial fibrillation     .  Lymphadenopathy     .  Testosterone deficiency     .  Anemia     .  Vitamin d deficiency     .  ED (erectile dysfunction)     .  Morbid obesity     .  OSA (obstructive sleep apnea)       CPAP-17.5 cm water pressure    .  Kidney stones       stent, lithotripsy    .  Lymphadenopathy     .  Diabetes mellitus       controlled    .  Testosterone deficiency     .  Excess or deficiency of vitamin D     .  ED (erectile dysfunction)       Depression : doing fair continue wellbutrin and lexapro     2. Anxiety: fluctuates, continue lexapro. May get ativan from pain clinic, cautioned about dependence  3. ADHD :not distracted gets adderall from primary care   I discussed the assessment and treatment plan with the patient. The patient was provided an opportunity to ask questions and all were answered.  The patient agreed with the plan and demonstrated an understanding of the instructions.   The patient was advised to call back or seek an in-person evaluation if the symptoms worsen or if the condition fails to improve as anticipated. meds refilled wellbutrin and lexapro Fu 4-72mI provided 15 minutes of non-face-to-face time during this encounter.   NMerian Capron M.D.  04/19/2019 2:40 PM

## 2019-04-22 ENCOUNTER — Encounter: Payer: Self-pay | Admitting: Family Medicine

## 2019-04-22 ENCOUNTER — Ambulatory Visit (INDEPENDENT_AMBULATORY_CARE_PROVIDER_SITE_OTHER): Payer: BC Managed Care – PPO | Admitting: Family Medicine

## 2019-04-22 ENCOUNTER — Other Ambulatory Visit: Payer: Self-pay

## 2019-04-22 VITALS — BP 129/84 | HR 52 | Ht 69.0 in | Wt 330.0 lb

## 2019-04-22 DIAGNOSIS — Z0000 Encounter for general adult medical examination without abnormal findings: Secondary | ICD-10-CM | POA: Diagnosis not present

## 2019-04-22 DIAGNOSIS — F988 Other specified behavioral and emotional disorders with onset usually occurring in childhood and adolescence: Secondary | ICD-10-CM | POA: Diagnosis not present

## 2019-04-22 DIAGNOSIS — Z794 Long term (current) use of insulin: Secondary | ICD-10-CM

## 2019-04-22 DIAGNOSIS — R600 Localized edema: Secondary | ICD-10-CM

## 2019-04-22 DIAGNOSIS — Z23 Encounter for immunization: Secondary | ICD-10-CM | POA: Diagnosis not present

## 2019-04-22 DIAGNOSIS — E118 Type 2 diabetes mellitus with unspecified complications: Secondary | ICD-10-CM | POA: Diagnosis not present

## 2019-04-22 DIAGNOSIS — H9202 Otalgia, left ear: Secondary | ICD-10-CM

## 2019-04-22 DIAGNOSIS — M793 Panniculitis, unspecified: Secondary | ICD-10-CM | POA: Diagnosis not present

## 2019-04-22 DIAGNOSIS — I10 Essential (primary) hypertension: Secondary | ICD-10-CM | POA: Diagnosis not present

## 2019-04-22 DIAGNOSIS — I480 Paroxysmal atrial fibrillation: Secondary | ICD-10-CM

## 2019-04-22 DIAGNOSIS — E291 Testicular hypofunction: Secondary | ICD-10-CM

## 2019-04-22 DIAGNOSIS — M1A09X0 Idiopathic chronic gout, multiple sites, without tophus (tophi): Secondary | ICD-10-CM

## 2019-04-22 DIAGNOSIS — Z125 Encounter for screening for malignant neoplasm of prostate: Secondary | ICD-10-CM

## 2019-04-22 NOTE — Patient Instructions (Signed)

## 2019-04-22 NOTE — Progress Notes (Signed)
CPE  Established Patient Office Visit  Subjective:  Patient ID: Rodney Bright, male    DOB: [birth year]  Age: 59 y.o. MRN: [id number]  CC:  Chief Complaint  Patient presents with  . Annual Exam    HPI Rodney Bright presents for CPE.    He was previously followed at [facility] this past year because his insurance changed and we were out of network.  It is changed again this year and so he is back to reestablish care.  He did want to follow-up on a couple of things and does need refills on some of his medications.  He did want to give me some updates.  His pain regimen has changed.Pain management with [facility] now and he is on oxycodone 6 tabs daily.  He is also been having some left ear and jaw pain.  It kind of radiates downward.  No known triggers but it does seem to come and go.  He has mentioned it before and has always had a normal ear exam.  He denies any pain in the TMJ jaw with chewing.  He is not sure if it could be a dental issue but he does not think so.  He is also requesting referral to get back in with cardiology with [facility].  He was previously seeing Dr. Kirk Ruths.  Again he had to transfer care for a year because of networks.  He would also like referral back to plastic surgery.  He is just consulted with them for a panniculectomy when he realized his insurance had changed and they were no longer covered.  They were actually planning on surgery which she is quite excited about.  He unfortunately experiences frequent infections in the area and it can cause problems with his urination as well.   Past Medical History:  Diagnosis Date  . Anemia   . Diabetes mellitus    controlled  . ED (erectile dysfunction)   . Gout   . Kidney stones    stent, lithotripsy  . Lymphadenopathy   . Morbid obesity ([place])   . OSA (obstructive sleep apnea)    CPAP-17.5 cm water pressure  . Paroxysmal atrial fibrillation (HCC)   . Pneumonia   . S/P emergency tracheotomy  for assistance in breathing [facility])    at age 47  . Testosterone deficiency   . Vitamin D deficiency     Past Surgical History:  Procedure Laterality Date  . CHOLECYSTECTOMY    . CYSTOSCOPY     retrograde and double J catheter insertion.  Marland Kitchen FINGER SURGERY     ulnar digital nerve and artery right index finger  . GASTRIC BYPASS  2005   600 lbs prior to surgery  . TONSILLECTOMY      Family History  Problem Relation Age of Onset  . Cancer Mother        lung, heavy smoker  . Hypertension Mother   . Hyperlipidemia Mother   . Cancer Father        melanoma  . Aneurysm Father        cardiac  . Hyperlipidemia Father   . Hypertension Father   . Hyperlipidemia Sister   . Hypertension Sister   . Hyperlipidemia Brother   . Hypertension Brother   . Stroke Other     Social History   Socioeconomic History  . Marital status: Married    Spouse name: Not on file  . Number of children: Not on file  . Years of education:  Not on file  . Highest education level: Not on file  Occupational History  . Not on file  Tobacco Use  . Smoking status: Never Smoker  . Smokeless tobacco: Never Used  Substance and Sexual Activity  . Alcohol use: No  . Drug use: No    Comment: prior cocaine abuse (heavy) in his 74s.  denies subsequent use  . Sexual activity: Yes    Partners: Female    Birth control/protection: None    Comment: on full disability, separated, no regular exercise,walks some, drinks pot of coffee a day.  Other Topics Concern  . Not on file  Social History Narrative   Drinks one pot of coffee daily.   Regular exercise-no, walks some      Lives in [place] with roommate.   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   Social Determinants of Health   Financial Resource Strain:   . Difficulty of Paying Living Expenses: Not on file  Food Insecurity:   . Worried About Charity fundraiser in the Last Year: Not on file  . Ran Out of Food in the Last Year:  Not on file  Transportation Needs:   . Lack of Transportation (Medical): Not on file  . Lack of Transportation (Non-Medical): Not on file  Physical Activity:   . Days of Exercise per Week: Not on file  . Minutes of Exercise per Session: Not on file  Stress:   . Feeling of Stress : Not on file  Social Connections:   . Frequency of Communication with Friends and Family: Not on file  . Frequency of Social Gatherings with Friends and Family: Not on file  . Attends Religious Services: Not on file  . Active Member of Clubs or Organizations: Not on file  . Attends Archivist Meetings: Not on file  . Marital Status: Not on file  Intimate Partner Violence:   . Fear of Current or Ex-Partner: Not on file  . Emotionally Abused: Not on file  . Physically Abused: Not on file  . Sexually Abused: Not on file    Outpatient Medications Prior to Visit  Medication Sig Dispense Refill  . alfuzosin (UROXATRAL) 10 MG 24 hr tablet Take 10 mg by mouth daily.  10  . allopurinol (ZYLOPRIM) 100 MG tablet Take 1 tablet (100 mg total) by mouth daily. [place].APPOINTMENT AND LABS REQUIRED FOR REFILLS. 30 tablet 0  . AMBULATORY NON FORMULARY MEDICATION Medication Name: CPAP machine with nasal mask and supplies set to 17.5 cm water pressure.  No humidifier.  Sent to [facility].  See sleep study from 2009 attached.  Dx: OSA. 1 Units PRN.  Marland Kitchen AMBULATORY NON FORMULARY MEDICATION Medication Name: Cyclobenzaprine 2% gabapentin 3% Baclofen 2%  Diclofenac 3% Qty# 120 ml Sig: apply small amount topically to effected area 2-3 times a day as directed Fax to [phone] 120 mL 0  . amiodarone (PACERONE) 200 MG tablet Take 1 tablet (200 mg total) by mouth daily. OFFICE VISIT NEEDED FOR FUTURE REFILLS 30 tablet 0  . amphetamine-dextroamphetamine (ADDERALL) 30 MG tablet Take 1 tablet by mouth 2 (two) times daily. 60 tablet 0  . amphetamine-dextroamphetamine (ADDERALL) 30 MG tablet Take 1 tablet by mouth 2 (two)  times daily. 60 tablet 0  . amphetamine-dextroamphetamine (ADDERALL) 30 MG tablet Take 1 tablet by mouth 2 (two) times daily. 60 tablet 0  . aspirin EC 81 MG tablet Take 1 tablet (81 mg total) by mouth  daily. 90 tablet 3  . blood glucose meter kit and supplies KIT Dispense based on patient and insurance preference. Use up to four times daily as directed. (FOR ICD-9 250.00, 250.01). 1 each 0  . buPROPion (WELLBUTRIN XL) 300 MG 24 hr tablet Take 1 tablet (300 mg total) by mouth daily. 30 tablet 3  . buPROPion (WELLBUTRIN) 100 MG tablet Take 1 tablet (100 mg total) by mouth daily. Add to Wellbutrin '300mg'$  already taking. 30 tablet 3  . diltiazem (CARDIZEM CD) 240 MG 24 hr capsule Take 1 capsule (240 mg total) by mouth daily. KEEP OV. 90 capsule 3  . EASY COMFORT PEN NEEDLES 31G X 8 MM MISC use once a day with victoza    . EASY PLUS II GLUCOSE TEST test strip use to test blood sugar once daily 100 each 11  . escitalopram (LEXAPRO) 20 MG tablet Take '20mg'$  tablets twice a day. 60 tablet 3  . gabapentin (NEURONTIN) 300 MG capsule Take 300 mg by mouth at bedtime.    Marland Kitchen NARCAN 4 MG/0.1ML LIQD nasal spray kit Inject solution in to one nostril for suspected overdose. Repeat in 3-5 minutes if necessary in the other nostril. CALL 911  0  . oxyCODONE (ROXICODONE) 15 MG immediate release tablet   0  . pravastatin (PRAVACHOL) 40 MG tablet Take 1 tablet (40 mg total) by mouth daily. KEEP OV. 30 tablet 0  . Testosterone 1.62 % GEL place 4 pumps on the skin every morning] 150 g 2  . tiZANidine (ZANAFLEX) 4 MG tablet Take 4 mg by mouth once.     Marland Kitchen VICTOZA 18 MG/3ML SOPN Inject 0.3 mLs (1.8 mg total) into the skin daily. (54/1.8=30) 9 mL 3  . zolpidem (AMBIEN CR) 12.5 MG CR tablet Take 12.5 mg by mouth at bedtime.  3  . diltiazem (TIAZAC) 240 MG 24 hr capsule Take 240 mg by mouth daily.    . AMBULATORY NON FORMULARY MEDICATION Medication Name: Needs CPAP tubing, supplies. Dx OSA.  09/05/17. 1 vial 0  . diltiazem  (CARDIZEM CD) 300 MG 24 hr capsule Take 1 capsule (240 mg total) by mouth daily. [place] Office Visit.     No facility-administered medications prior to visit.    Allergies  Allergen Reactions  . Colchicine Shortness Of Breath, Swelling and Itching  . Prunus Persica Swelling    "Peaches" " swelling of face"  . Peach Flavor     "Peaches" " swelling of face"    ROS Review of Systems    Objective:    Physical Exam  Constitutional: He is oriented to person, place, and time. He appears well-developed and well-nourished.  HENT:  Head: Normocephalic and atraumatic.  Right Ear: External ear normal.  Left Ear: External ear normal.  Nose: Nose normal.  Mouth/Throat: Oropharynx is clear and moist.  Large and swollen bilateral sent anterior cervical salivary glands.  Eyes: Pupils are equal, round, and reactive to light. Conjunctivae and EOM are normal.  Neck: No thyromegaly present.  Cardiovascular: Normal rate, regular rhythm, normal heart sounds and intact distal pulses.  Pulmonary/Chest: Effort normal and breath sounds normal.  Abdominal: Soft. Bowel sounds are normal. He exhibits no distension and no mass. There is no abdominal tenderness. There is no rebound and no guarding.  Musculoskeletal:        General: Normal range of motion.     Cervical back: Normal range of motion and neck supple.  Lymphadenopathy:    He has no cervical adenopathy.  Neurological:  He is alert and oriented to person, place, and time. He has normal reflexes.  Skin: Skin is warm and dry.  Psychiatric: He has a normal mood and affect. His behavior is normal. Judgment and thought content normal.    BP 129/84   Pulse (!) 52   Ht '5\' 9"'$  (1.753 m)   Wt (!) 330 lb (149.7 kg)   SpO2 95%   BMI 48.73 kg/m  Wt Readings from Last 3 Encounters:  04/22/19 (!) 330 lb (149.7 kg)  08/05/18 (!) 317 lb (143.8 kg)  07/28/18 (!) 312 lb 9.6 oz (141.8 kg)     Health Maintenance Due  Topic Date Due  . OPHTHALMOLOGY  EXAM  09/06/2015  . HEMOGLOBIN A1C  02/04/2019    There are no preventive care reminders to display for this patient.  Lab Results  Component Value Date   TSH 3.14 08/03/2018   Lab Results  Component Value Date   WBC 4.7 08/03/2018   HGB 12.5 (L) 08/03/2018   HCT 39.5 08/03/2018   MCV 85.9 08/03/2018   PLT 203 08/03/2018   Lab Results  Component Value Date   NA 141 08/03/2018   K 4.3 08/03/2018   CO2 30 08/03/2018   GLUCOSE 107 (H) 08/03/2018   BUN 20 08/03/2018   CREATININE 1.30 (H) 08/03/2018   BILITOT 0.4 08/03/2018   ALKPHOS 87 04/24/2017   AST 33 08/03/2018   ALT 27 08/03/2018   PROT 6.4 08/03/2018   ALBUMIN 4.1 04/22/2017   CALCIUM 9.1 08/03/2018   Lab Results  Component Value Date   CHOL 117 08/03/2018   Lab Results  Component Value Date   HDL 46 08/03/2018   Lab Results  Component Value Date   LDLCALC 57 08/03/2018   Lab Results  Component Value Date   TRIG 59 08/03/2018   Lab Results  Component Value Date   CHOLHDL 2.5 08/03/2018   Lab Results  Component Value Date   HGBA1C 5.5 08/05/2018      Assessment & Plan:   Problem List Items Addressed This Visit      Cardiovascular and Mediastinum   ESSENTIAL HYPERTENSION, BENIGN   Relevant Orders   HgB A1c   Testosterone Total,Free,Bio, Males   Uric acid   TSH   PSA   Microalbumin, urine   COMPLETE METABOLIC PANEL WITH GFR   Lipid Panel w/reflex Direct LDL   Atrial fibrillation ([place])   Relevant Orders   Ambulatory referral to Cardiology     Endocrine   Hypogonadism in male    Due to recheck labs. ajdust dose if needed      Controlled diabetes mellitus type 2 with complications (HCC)   Relevant Orders   HgB A1c   COMPLETE METABOLIC PANEL WITH GFR   Lipid Panel w/reflex Direct LDL     Musculoskeletal and Integument   Panniculitis   Relevant Orders   Ambulatory referral to Plastic Surgery     Other   MORBID OBESITY    He really has done a phenomenal job with weight loss  over the last couple of years but has plateaued but says he plans on getting back on track.      Attention deficit disorder (ADD) without hyperactivity    Other Visit Diagnoses    Routine general medical examination at a health care facility    -  Primary   Relevant Orders   HgB A1c   Testosterone Total,Free,Bio, Males   Uric acid   TSH  PSA   Microalbumin, urine   COMPLETE METABOLIC PANEL WITH GFR   Lipid Panel w/reflex Direct LDL   Tdap vaccine greater than or equal to 7yo IM (Completed)   Hypogonadism male       Relevant Orders   HgB A1c   Testosterone Total,Free,Bio, Males   Uric acid   TSH   PSA   Microalbumin, urine   COMPLETE METABOLIC PANEL WITH GFR   Lipid Panel w/reflex Direct LDL   Idiopathic chronic gout of multiple sites without tophus       Relevant Orders   Uric acid   Screening for prostate cancer       Relevant Orders   PSA   Salivary gland swelling       Relevant Orders   Ambulatory referral to ENT   Left ear pain       Relevant Orders   Ambulatory referral to ENT   Need for tetanus, diphtheria, and acellular pertussis (Tdap) vaccine in patient of adolescent age or older       Relevant Orders   Tdap vaccine greater than or equal to 7yo IM (Completed)   Need for immunization against influenza       Relevant Orders   Flu Vaccine QUAD 36+ mos IM (Completed)     Recommended updating Tdap and flu vaccine.  Left ear/jaw pain-we will refer to ENT for further evaluation.  Again ear exam is normal, but he does have some pretty swollen lymph nodes bilaterally, which is reassuring but it continues to recur.  Consider TMJ though he is not tender directly over the joint.  No orders of the defined types were placed in this encounter.   Follow-up: Return in about 3 months (around 07/21/2019) for Diabetes follow-up.    Beatrice Lecher, MD

## 2019-04-23 ENCOUNTER — Encounter: Payer: Self-pay | Admitting: Family Medicine

## 2019-04-23 NOTE — Assessment & Plan Note (Signed)
Due to recheck labs. ajdust dose if needed

## 2019-04-23 NOTE — Assessment & Plan Note (Signed)
He really has done a phenomenal job with weight loss over the last couple of years but has plateaued but says he plans on getting back on track.

## 2019-04-29 DIAGNOSIS — M5417 Radiculopathy, lumbosacral region: Secondary | ICD-10-CM | POA: Diagnosis not present

## 2019-04-29 DIAGNOSIS — G4733 Obstructive sleep apnea (adult) (pediatric): Secondary | ICD-10-CM | POA: Diagnosis not present

## 2019-04-29 DIAGNOSIS — G5603 Carpal tunnel syndrome, bilateral upper limbs: Secondary | ICD-10-CM | POA: Diagnosis not present

## 2019-05-05 ENCOUNTER — Institutional Professional Consult (permissible substitution): Payer: BC Managed Care – PPO | Admitting: Plastic Surgery

## 2019-05-10 DIAGNOSIS — Z79899 Other long term (current) drug therapy: Secondary | ICD-10-CM | POA: Diagnosis not present

## 2019-05-10 DIAGNOSIS — M545 Low back pain: Secondary | ICD-10-CM | POA: Diagnosis not present

## 2019-05-10 DIAGNOSIS — M5412 Radiculopathy, cervical region: Secondary | ICD-10-CM | POA: Diagnosis not present

## 2019-05-10 DIAGNOSIS — M5417 Radiculopathy, lumbosacral region: Secondary | ICD-10-CM | POA: Diagnosis not present

## 2019-05-10 DIAGNOSIS — G8929 Other chronic pain: Secondary | ICD-10-CM | POA: Diagnosis not present

## 2019-05-20 ENCOUNTER — Telehealth: Payer: Self-pay | Admitting: Family Medicine

## 2019-05-20 NOTE — Telephone Encounter (Signed)
Patient calls and states when in for his visit his refills were supposed to be sent to [facility].  They are not there please send all refills of meds to pharmacy. KG LPN

## 2019-05-28 ENCOUNTER — Other Ambulatory Visit: Payer: Self-pay | Admitting: *Deleted

## 2019-05-31 MED ORDER — ZOLPIDEM TARTRATE ER 12.5 MG PO TBCR: 12.5000 mg | EXTENDED_RELEASE_TABLET | Freq: Every day | ORAL | 1 refills | Status: DC

## 2019-06-04 DIAGNOSIS — I10 Essential (primary) hypertension: Secondary | ICD-10-CM | POA: Diagnosis not present

## 2019-06-04 DIAGNOSIS — Z0000 Encounter for general adult medical examination without abnormal findings: Secondary | ICD-10-CM | POA: Diagnosis not present

## 2019-06-04 DIAGNOSIS — E118 Type 2 diabetes mellitus with unspecified complications: Secondary | ICD-10-CM | POA: Diagnosis not present

## 2019-06-04 DIAGNOSIS — Z794 Long term (current) use of insulin: Secondary | ICD-10-CM | POA: Diagnosis not present

## 2019-06-04 DIAGNOSIS — Z125 Encounter for screening for malignant neoplasm of prostate: Secondary | ICD-10-CM | POA: Diagnosis not present

## 2019-06-07 DIAGNOSIS — M5417 Radiculopathy, lumbosacral region: Secondary | ICD-10-CM | POA: Diagnosis not present

## 2019-06-07 DIAGNOSIS — M539 Dorsopathy, unspecified: Secondary | ICD-10-CM | POA: Diagnosis not present

## 2019-06-07 DIAGNOSIS — G5603 Carpal tunnel syndrome, bilateral upper limbs: Secondary | ICD-10-CM | POA: Diagnosis not present

## 2019-06-07 DIAGNOSIS — Z79899 Other long term (current) drug therapy: Secondary | ICD-10-CM | POA: Diagnosis not present

## 2019-06-07 DIAGNOSIS — M545 Low back pain: Secondary | ICD-10-CM | POA: Diagnosis not present

## 2019-06-07 LAB — TSH: TSH: 1.72 mIU/L (ref 0.40–4.50)

## 2019-06-07 LAB — COMPLETE METABOLIC PANEL WITH GFR
AG Ratio: 1.9 (calc) (ref 1.0–2.5)
ALT: 17 U/L (ref 9–46)
AST: 21 U/L (ref 10–35)
Albumin: 4.3 g/dL (ref 3.6–5.1)
Alkaline phosphatase (APISO): 72 U/L (ref 35–144)
BUN/Creatinine Ratio: 15 (calc) (ref 6–22)
BUN: 21 mg/dL (ref 7–25)
CO2: 27 mmol/L (ref 20–32)
Calcium: 9.1 mg/dL (ref 8.6–10.3)
Chloride: 106 mmol/L (ref 98–110)
Creat: 1.38 mg/dL — ABNORMAL HIGH (ref 0.70–1.25)
GFR, Est African American: 64 mL/min/{1.73_m2} (ref >=60)
GFR, Est Non African American: 55 mL/min/{1.73_m2} — ABNORMAL LOW (ref >=60)
Globulin: 2.3 g/dL (calc) (ref 1.9–3.7)
Glucose, Bld: 134 mg/dL — ABNORMAL HIGH (ref 65–99)
Potassium: 4.6 mmol/L (ref 3.5–5.3)
Sodium: 141 mmol/L (ref 135–146)
Total Bilirubin: 0.6 mg/dL (ref 0.2–1.2)
Total Protein: 6.6 g/dL (ref 6.1–8.1)

## 2019-06-07 LAB — TESTOSTERONE TOTAL,FREE,BIO, MALES
Albumin: 4.3 g/dL (ref 3.6–5.1)
Sex Hormone Binding: 60 nmol/L (ref 22–77)
Testosterone, Bioavailable: 166.1 ng/dL (ref >=110.0)
Testosterone, Free: 84.3 pg/mL (ref 46.0–224.0)
Testosterone: 949 ng/dL — ABNORMAL HIGH (ref 250–827)

## 2019-06-07 LAB — HEMOGLOBIN A1C
Hgb A1c MFr Bld: 6.2 % of total Hgb — ABNORMAL HIGH (ref <5.7)
Mean Plasma Glucose: 131 (calc)
eAG (mmol/L): 7.3 (calc)

## 2019-06-07 LAB — LIPID PANEL W/REFLEX DIRECT LDL
Cholesterol: 145 mg/dL (ref <200)
HDL: 41 mg/dL (ref >=40)
LDL Cholesterol (Calc): 84 mg/dL (calc)
Non-HDL Cholesterol (Calc): 104 mg/dL (calc) (ref <130)
Total CHOL/HDL Ratio: 3.5 (calc) (ref <5.0)
Triglycerides: 104 mg/dL (ref <150)

## 2019-06-07 LAB — MICROALBUMIN, URINE: Microalb, Ur: 0.6 mg/dL

## 2019-06-07 LAB — PSA: PSA: 1.4 ng/mL (ref <=4.0)

## 2019-06-07 LAB — URIC ACID: Uric Acid, Serum: 7 mg/dL (ref 4.0–8.0)

## 2019-06-10 ENCOUNTER — Other Ambulatory Visit: Payer: Self-pay | Admitting: Family Medicine

## 2019-06-10 DIAGNOSIS — F4323 Adjustment disorder with mixed anxiety and depressed mood: Secondary | ICD-10-CM | POA: Diagnosis not present

## 2019-06-14 ENCOUNTER — Other Ambulatory Visit: Payer: Self-pay | Admitting: *Deleted

## 2019-06-14 MED ORDER — PRAVASTATIN SODIUM 40 MG PO TABS: 40.0000 mg | ORAL_TABLET | Freq: Every day | ORAL | 3 refills | Status: DC

## 2019-06-16 ENCOUNTER — Other Ambulatory Visit: Payer: Self-pay | Admitting: Cardiology

## 2019-06-16 ENCOUNTER — Other Ambulatory Visit: Payer: Self-pay

## 2019-06-16 MED ORDER — AMPHETAMINE-DEXTROAMPHETAMINE 30 MG PO TABS: 30.0000 mg | ORAL_TABLET | Freq: Two times a day (BID) | ORAL | 0 refills | Status: DC

## 2019-06-16 NOTE — Telephone Encounter (Signed)
 *  STAT* If patient is at the pharmacy, call can be transferred to refill team.   1. Which medications need to be refilled? (please list name of each medication and dose if known)  amiodarone (PACERONE) 200 MG tablet diltiazem (CARDIZEM CD) 240 MG 24 hr capsule   2. Which pharmacy/location (including street and city if local pharmacy) is medication to be sent to? [facility] - [place], [place] - [address]  3. Do they need a 30 day or 90 day supply? 30 days  Patient is completely out of amiodarone

## 2019-06-16 NOTE — Telephone Encounter (Signed)
Bill called and requested a refill on Adderall and Amiodarone. Amiodarone is normally prescribed by Dr Jens Som. Bill wanted to know if Dr Linford Arnold would refill the medication.

## 2019-06-17 ENCOUNTER — Other Ambulatory Visit: Payer: Self-pay | Admitting: Family Medicine

## 2019-06-18 MED ORDER — AMIODARONE HCL 200 MG PO TABS: 200.0000 mg | ORAL_TABLET | Freq: Every day | ORAL | 0 refills | Status: DC

## 2019-06-18 MED ORDER — DILTIAZEM HCL ER COATED BEADS 240 MG PO CP24: 240.0000 mg | ORAL_CAPSULE | Freq: Every day | ORAL | 0 refills | Status: DC

## 2019-06-18 NOTE — Telephone Encounter (Signed)
Left message informing patient his prescriptions for Amiodarone and Diltiazem were called in to pharmacy.

## 2019-06-22 ENCOUNTER — Other Ambulatory Visit: Payer: Self-pay | Admitting: Family Medicine

## 2019-06-22 DIAGNOSIS — E291 Testicular hypofunction: Secondary | ICD-10-CM

## 2019-06-29 ENCOUNTER — Other Ambulatory Visit: Payer: Self-pay | Admitting: Family Medicine

## 2019-06-29 NOTE — Telephone Encounter (Signed)
Please call pharmacy. He should still have a refill on file.

## 2019-06-30 NOTE — Telephone Encounter (Signed)
Called pharmacy and patient does have a refill on file.  Instructed pharmacy to fill that one and that this Rx would be declined.  Can you refuse it- it will not alloqw me to do so.  Thanks  KG LPN

## 2019-07-06 DIAGNOSIS — Z79899 Other long term (current) drug therapy: Secondary | ICD-10-CM | POA: Diagnosis not present

## 2019-07-06 DIAGNOSIS — G5603 Carpal tunnel syndrome, bilateral upper limbs: Secondary | ICD-10-CM | POA: Diagnosis not present

## 2019-07-06 DIAGNOSIS — M539 Dorsopathy, unspecified: Secondary | ICD-10-CM | POA: Diagnosis not present

## 2019-07-06 DIAGNOSIS — M545 Low back pain: Secondary | ICD-10-CM | POA: Diagnosis not present

## 2019-07-06 DIAGNOSIS — M5417 Radiculopathy, lumbosacral region: Secondary | ICD-10-CM | POA: Diagnosis not present

## 2019-07-14 ENCOUNTER — Other Ambulatory Visit: Payer: Self-pay

## 2019-07-21 ENCOUNTER — Ambulatory Visit: Payer: BC Managed Care – PPO | Admitting: Family Medicine

## 2019-07-22 NOTE — Progress Notes (Deleted)
HPI: FU atrial fibrillation. Echocardiogram in March of 2014 showed an ejection fraction of 40-45%, mild left atrial enlargement and grade 2 diastolic dysfunction. Study was technically difficult. Nuclear study in April of 2014 at [facility] showed an ejection fraction greater than 65% and normal perfusion. Patient had an outpatient monitor for recurrent palpitations which revealed wide-complex tachycardia. The patient was seen by Dr. Rayann Heman and this was felt to be atrial flutter with one-to-one conduction. He was started on flecainide. Followup exercise treadmill showed no induced ventricular arrhythmias but there was a rate related left bundle branch block. Flecanide changed to amiodarone previously at [facility]. Patient felt not to be a candidate for ablation due to size. Hospitalized at [facility] 8/15 with GI bleed and anticoagulation DCed. Patient did have a colonoscopy which revealed pandiverticulosis. He also was transfused 8 units of packed red blood cells. Discharge summary states anticoagulation can be resumed in one week if hemoglobin stable. Patient decided to continue aspirin and avoid Coumadin.Nuclear study March 2018 showed ejection fraction 34% and ischemia in the inferior, inferolateral and apical inferior walls.  At previous office visit we discussed options and elected conservative therapy.  It was felt that the inferior defect could certainly be related to his size.  Echocardiogram May 2018 showed normal LV function.  Since I last saw him,   Current Outpatient Medications  Medication Sig Dispense Refill  . alfuzosin (UROXATRAL) 10 MG 24 hr tablet Take 10 mg by mouth daily.  10  . allopurinol (ZYLOPRIM) 100 MG tablet Take 1 tablet (100 mg total) by mouth daily. [place].APPOINTMENT AND LABS REQUIRED FOR REFILLS. 30 tablet 0  . AMBULATORY NON FORMULARY MEDICATION Medication Name: CPAP machine with nasal mask and supplies set to 17.5 cm water pressure.  No humidifier.  Sent  to [facility].  See sleep study from 2009 attached.  Dx: OSA. 1 Units PRN.  Marland Kitchen AMBULATORY NON FORMULARY MEDICATION Medication Name: Cyclobenzaprine 2% gabapentin 3% Baclofen 2%  Diclofenac 3% Qty# 120 ml Sig: apply small amount topically to effected area 2-3 times a day as directed Fax to [phone] 120 mL 0  . amiodarone (PACERONE) 200 MG tablet Take 1 tablet (200 mg total) by mouth daily. Must keep appointment for future refills 30 tablet 0  . amphetamine-dextroamphetamine (ADDERALL) 30 MG tablet Take 1 tablet by mouth 2 (two) times daily. 60 tablet 0  . amphetamine-dextroamphetamine (ADDERALL) 30 MG tablet Take 1 tablet by mouth 2 (two) times daily. 60 tablet 0  . amphetamine-dextroamphetamine (ADDERALL) 30 MG tablet Take 1 tablet by mouth 2 (two) times daily. 60 tablet 0  . aspirin EC 81 MG tablet Take 1 tablet (81 mg total) by mouth daily. 90 tablet 3  . blood glucose meter kit and supplies KIT Dispense based on patient and insurance preference. Use up to four times daily as directed. (FOR ICD-9 250.00, 250.01). 1 each 0  . buPROPion (WELLBUTRIN XL) 300 MG 24 hr tablet Take 1 tablet (300 mg total) by mouth daily. 30 tablet 3  . buPROPion (WELLBUTRIN) 100 MG tablet Take 1 tablet (100 mg total) by mouth daily. Add to Wellbutrin 361m already taking. 30 tablet 3  . diltiazem (CARDIZEM CD) 240 MG 24 hr capsule Take 1 capsule (240 mg total) by mouth daily. Keep appointment for future refills. 40 capsule 0  . EASY COMFORT PEN NEEDLES 31G X 8 MM MISC use once a day with victoza    . EASY PLUS II GLUCOSE TEST  test strip use to test blood sugar once daily 100 each 11  . escitalopram (LEXAPRO) 20 MG tablet Take 27m tablets twice a day. 60 tablet 3  . gabapentin (NEURONTIN) 300 MG capsule Take 300 mg by mouth at bedtime.    .Marland KitchenNARCAN 4 MG/0.1ML LIQD nasal spray kit Inject solution in to one nostril for suspected overdose. Repeat in 3-5 minutes if necessary in the other nostril. CALL 911  0  . oxyCODONE  (ROXICODONE) 15 MG immediate release tablet   0  . pravastatin (PRAVACHOL) 40 MG tablet Take 1 tablet (40 mg total) by mouth daily. 90 tablet 3  . Testosterone 20.25 MG/ACT (1.62%) GEL Apply 3 Pump topically daily. 150 g 2  . tiZANidine (ZANAFLEX) 4 MG tablet Take 4 mg by mouth once.     .Marland KitchenVICTOZA 18 MG/3ML SOPN Inject 0.3 mLs (1.8 mg total) into the skin daily. (54/1.8=30) 9 mL 3  . zolpidem (AMBIEN CR) 12.5 MG CR tablet Take 1 tablet (12.5 mg total) by mouth at bedtime. 30 tablet 1   No current facility-administered medications for this visit.     Past Medical History:  Diagnosis Date  . Anemia   . Diabetes mellitus    controlled  . ED (erectile dysfunction)   . Gout   . Kidney stones    stent, lithotripsy  . Lymphadenopathy   . Morbid obesity ([place]   . OSA (obstructive sleep apnea)    CPAP-17.5 cm water pressure  . Paroxysmal atrial fibrillation (HCC)   . Pneumonia   . S/P emergency tracheotomy for assistance in breathing ([facility]    at age 89 . Testosterone deficiency   . Vitamin D deficiency     Past Surgical History:  Procedure Laterality Date  . CHOLECYSTECTOMY    . CYSTOSCOPY     retrograde and double J catheter insertion.  .Marland KitchenFINGER SURGERY     ulnar digital nerve and artery right index finger  . GASTRIC BYPASS  2005   600 lbs prior to surgery  . TONSILLECTOMY      Social History   Socioeconomic History  . Marital status: Married    Spouse name: Not on file  . Number of children: Not on file  . Years of education: Not on file  . Highest education level: Not on file  Occupational History  . Not on file  Tobacco Use  . Smoking status: Never Smoker  . Smokeless tobacco: Never Used  Substance and Sexual Activity  . Alcohol use: No  . Drug use: No    Comment: prior cocaine abuse (heavy) in his 264s  denies subsequent use  . Sexual activity: Yes    Partners: Female    Birth control/protection: None    Comment: on full disability, separated, no regular  exercise,walks some, drinks pot of coffee a day.  Other Topics Concern  . Not on file  Social History Narrative   Drinks one pot of coffee daily.   Regular exercise-no, walks some      [place]   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 Difficulty of Paying Living Expenses:   Food Insecurity:   . Worried About RCharity fundraiserin the Last Year:   . RArboriculturistin the Last Year:   Transportation Needs:   . LFilm/video editor(Medical):   .Marland Kitchen  Lack of Transportation (Non-Medical):   Physical Activity:   . Days of Exercise per Week:   . Minutes of Exercise per Session:   Stress:   . Feeling of Stress :   Social Connections:   . Frequency of Communication with Friends and Family:   . Frequency of Social Gatherings with Friends and Family:   . Attends Religious Services:   . Active Member of Clubs or Organizations:   . Attends Archivist Meetings:   Marland Kitchen Marital Status:   Intimate Partner Violence:   . Fear of Current or Ex-Partner:   . Emotionally Abused:   Marland Kitchen Physically Abused:   . Sexually Abused:     Family History  Problem Relation Age of Onset  . Cancer Mother        lung, heavy smoker  . Hypertension Mother   . Hyperlipidemia Mother   . Cancer Father        melanoma  . Aneurysm Father        cardiac  . Hyperlipidemia Father   . Hypertension Father   . Hyperlipidemia Sister   . Hypertension Sister   . Hyperlipidemia Brother   . Hypertension Brother   . Stroke Other     ROS: no fevers or chills, productive cough, hemoptysis, dysphasia, odynophagia, melena, hematochezia, dysuria, hematuria, rash, seizure activity, orthopnea, PND, pedal edema, claudication. Remaining systems are negative.  Physical Exam: Well-developed well-nourished in no acute distress.  Skin is warm and dry.  HEENT is normal.  Neck is supple.  Chest is  clear to auscultation with normal expansion.  Cardiovascular exam is regular rate and rhythm.  Abdominal exam nontender or distended. No masses palpated. Extremities show no edema. neuro grossly intact  ECG- personally reviewed  A/P  1 paroxysmal atrial fibrillation-patient remains in sinus rhythm.  Continue amiodarone at present dose.  Check TSH, liver functions and chest x-ray.  As outlined in previous notes he had a life-threatening bleed on anticoagulation and he declines at this point.  He understands the higher risk of CVA.  We will continue aspirin for now.  2 morbid obesity-we discussed the importance of diet, exercise and weight loss.  3 hypertension-blood pressure controlled.  Continue present medical regimen.  4 previous abnormal nuclear study-patient is not having chest pain and previous abnormality may have been secondary to patient's size.  We will continue medical therapy.  Kirk Ruths, MD

## 2019-07-23 ENCOUNTER — Other Ambulatory Visit: Payer: Self-pay | Admitting: *Deleted

## 2019-07-23 ENCOUNTER — Other Ambulatory Visit: Payer: Self-pay | Admitting: Family Medicine

## 2019-07-23 MED ORDER — AMIODARONE HCL 200 MG PO TABS: 200.0000 mg | ORAL_TABLET | Freq: Every day | ORAL | 0 refills | Status: DC

## 2019-07-23 NOTE — Telephone Encounter (Signed)
Spoke to Rodney Bright. He canceled the appointment due to an impacted tooth that will be pulled on Monday.  He has all plans of attending his next appointment.

## 2019-07-23 NOTE — Telephone Encounter (Signed)
Please call patient: Pharmacy is requesting refills on his scheduled drugs.  He was supposed to come in for his appointment this week and missed the appointment.  I will go ahead and fill prescriptions today but he does need to keep his upcoming appointment for scheduled drugs or they will not be refilled.

## 2019-07-28 ENCOUNTER — Ambulatory Visit (INDEPENDENT_AMBULATORY_CARE_PROVIDER_SITE_OTHER): Payer: BC Managed Care – PPO | Admitting: Family Medicine

## 2019-07-28 ENCOUNTER — Encounter: Payer: Self-pay | Admitting: Family Medicine

## 2019-07-28 ENCOUNTER — Ambulatory Visit: Payer: BC Managed Care – PPO | Admitting: Cardiology

## 2019-07-28 ENCOUNTER — Other Ambulatory Visit: Payer: Self-pay

## 2019-07-28 VITALS — BP 122/75 | HR 66 | Ht 69.0 in | Wt 339.0 lb

## 2019-07-28 DIAGNOSIS — M25562 Pain in left knee: Secondary | ICD-10-CM | POA: Diagnosis not present

## 2019-07-28 DIAGNOSIS — I10 Essential (primary) hypertension: Secondary | ICD-10-CM | POA: Diagnosis not present

## 2019-07-28 DIAGNOSIS — E118 Type 2 diabetes mellitus with unspecified complications: Secondary | ICD-10-CM | POA: Diagnosis not present

## 2019-07-28 DIAGNOSIS — F988 Other specified behavioral and emotional disorders with onset usually occurring in childhood and adolescence: Secondary | ICD-10-CM

## 2019-07-28 DIAGNOSIS — M793 Panniculitis, unspecified: Secondary | ICD-10-CM

## 2019-07-28 DIAGNOSIS — Z794 Long term (current) use of insulin: Secondary | ICD-10-CM

## 2019-07-28 MED ORDER — ALLOPURINOL 100 MG PO TABS: 100.0000 mg | ORAL_TABLET | Freq: Every day | ORAL | 3 refills | Status: DC

## 2019-07-28 NOTE — Assessment & Plan Note (Addendum)
Last A1C looks great!! F/U in 3 months.  Did encourage him to get back into the gym and try to start working on losing weight again.

## 2019-07-28 NOTE — Assessment & Plan Note (Signed)
He was actually scheduled for surgery for panniculectomy right before his insurance change.  Now that it is switched again he actually has a consult lined up for May with the plastic surgeon.

## 2019-07-28 NOTE — Assessment & Plan Note (Signed)
Doing well with Adderall 30 mg.  We just refilled his medication about a week ago.  Follow-up in 3 months.

## 2019-07-28 NOTE — Progress Notes (Signed)
Established Patient Office Visit  Subjective:  Patient ID: Rodney Bright, male    DOB: 17-May-1960  Age: 59 y.o. MRN: [id number]  CC:  Chief Complaint  Patient presents with  . Diabetes    HPI Rodney Bright presents for   Diabetes - no hypoglycemic events. No wounds or sores that are not healing well. No increased thirst or urination. Checking glucose at home. Taking medications as prescribed without any side effects.  Up to 2.4 mg on his Victoza.  Been tolerating this dose well without any significant side effects or nausea.  Hypertension- Pt denies chest pain, SOB, dizziness, or heart palpitations.  Taking meds as directed w/o problems.  Denies medication side effects.    He has not been exercising bc his gym has been closed during Covid and has not opened back up and he has gained some weight which he is not happy about.  He needs to find a new gym that has a pool because he is not able to do a lot of walking on the hard ground with his weight.  He also needs let me know his been having some problems with his left knee and would like to get in with orthopedist.  He does not remember a specific trauma or injury but notices that he is having pain particularly going up steps.  His pain is near the patellar tendon and also medially.  He was previously walking 4 miles a day and this has reduced his ability to exercise.  He also wanted to discuss his family history of thyroid disorder in his both of his sisters.  We discussed that we just checked his thyroid in February and it looks fantastic.  He also had a request for a handicap placard   Past Medical History:  Diagnosis Date  . Anemia   . Diabetes mellitus    controlled  . ED (erectile dysfunction)   . Gout   . Kidney stones    stent, lithotripsy  . Lymphadenopathy   . Morbid obesity ([place])   . OSA (obstructive sleep apnea)    CPAP-17.5 cm water pressure  . Paroxysmal atrial fibrillation (HCC)   . Pneumonia   . S/P  emergency tracheotomy for assistance in breathing [facility])    at age 64  . Testosterone deficiency   . Vitamin D deficiency     Past Surgical History:  Procedure Laterality Date  . CHOLECYSTECTOMY    . CYSTOSCOPY     retrograde and double J catheter insertion.  Marland Kitchen FINGER SURGERY     ulnar digital nerve and artery right index finger  . GASTRIC BYPASS  2005   600 lbs prior to surgery  . TONSILLECTOMY      Family History  Problem Relation Age of Onset  . Cancer Mother        lung, heavy smoker  . Hypertension Mother   . Hyperlipidemia Mother   . Cancer Father        melanoma  . Aneurysm Father        cardiac  . Hyperlipidemia Father   . Hypertension Father   . Hyperlipidemia Sister   . Hypertension Sister   . Hyperlipidemia Brother   . Hypertension Brother   . Stroke Other     Social History   Socioeconomic History  . Marital status: Married    Spouse name: Not on file  . Number of children: Not on file  . Years of education: Not on file  .  Highest education level: Not on file  Occupational History  . Not on file  Tobacco Use  . Smoking status: Never Smoker  . Smokeless tobacco: Never Used  Substance and Sexual Activity  . Alcohol use: No  . Drug use: No    Comment: prior cocaine abuse (heavy) in his 60s.  denies subsequent use  . Sexual activity: Yes    Partners: Female    Birth control/protection: None    Comment: on full disability, separated, no regular exercise,walks some, drinks pot of coffee a day.  Other Topics Concern  . Not on file  Social History Narrative   Drinks one pot of coffee daily.   Regular exercise-no, walks some      Lives in [place] with roommate.   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   Social Determinants of Health   Financial Resource Strain:   . Difficulty of Paying Living Expenses:   Food Insecurity:   . Worried About Charity fundraiser in the Last Year:   . Arboriculturist in the Last Year:    Transportation Needs:   . Film/video editor (Medical):   Marland Kitchen Lack of Transportation (Non-Medical):   Physical Activity:   . Days of Exercise per Week:   . Minutes of Exercise per Session:   Stress:   . Feeling of Stress :   Social Connections:   . Frequency of Communication with Friends and Family:   . Frequency of Social Gatherings with Friends and Family:   . Attends Religious Services:   . Active Member of Clubs or Organizations:   . Attends Archivist Meetings:   Marland Kitchen Marital Status:   Intimate Partner Violence:   . Fear of Current or Ex-Partner:   . Emotionally Abused:   Marland Kitchen Physically Abused:   . Sexually Abused:     Outpatient Medications Prior to Visit  Medication Sig Dispense Refill  . amphetamine-dextroamphetamine (ADDERALL) 30 MG tablet Take one tablet by mouth twice daily    . alfuzosin (UROXATRAL) 10 MG 24 hr tablet Take 10 mg by mouth daily.  10  . AMBULATORY NON FORMULARY MEDICATION Medication Name: CPAP machine with nasal mask and supplies set to 17.5 cm water pressure.  No humidifier.  Sent to [facility].  See sleep study from 2009 attached.  Dx: OSA. 1 Units PRN.  Marland Kitchen AMBULATORY NON FORMULARY MEDICATION Medication Name: Cyclobenzaprine 2% gabapentin 3% Baclofen 2%  Diclofenac 3% Qty# 120 ml Sig: apply small amount topically to effected area 2-3 times a day as directed Fax to [phone] 120 mL 0  . amiodarone (PACERONE) 200 MG tablet Take 1 tablet (200 mg total) by mouth daily. KEEP OV. 30 tablet 0  . amphetamine-dextroamphetamine (ADDERALL) 30 MG tablet Take 1 tablet by mouth 2 (two) times daily. 60 tablet 0  . amphetamine-dextroamphetamine (ADDERALL) 30 MG tablet Take 1 tablet by mouth 2 (two) times daily. 60 tablet 0  . amphetamine-dextroamphetamine (ADDERALL) 30 MG tablet Take one tablet by mouth twice daily 60 tablet 0  . aspirin EC 81 MG tablet Take 1 tablet (81 mg total) by mouth daily. 90 tablet 3  . blood glucose meter kit and supplies KIT Dispense  based on patient and insurance preference. Use up to four times daily as directed. (FOR ICD-9 250.00, 250.01). 1 each 0  . buPROPion (WELLBUTRIN XL) 300 MG 24 hr tablet Take 1 tablet (300 mg total) by mouth daily. 30 tablet 3  .  buPROPion (WELLBUTRIN) 100 MG tablet Take 1 tablet (100 mg total) by mouth daily. Add to Wellbutrin 330m already taking. 30 tablet 3  . diltiazem (CARDIZEM CD) 240 MG 24 hr capsule Take 1 capsule (240 mg total) by mouth daily. Keep appointment for future refills. 40 capsule 0  . EASY COMFORT PEN NEEDLES 31G X 8 MM MISC use once a day with victoza    . EASY PLUS II GLUCOSE TEST test strip use to test blood sugar once daily 100 each 11  . escitalopram (LEXAPRO) 20 MG tablet Take 237mtablets twice a day. 60 tablet 3  . gabapentin (NEURONTIN) 300 MG capsule Take 300 mg by mouth at bedtime.    . Marland KitchenARCAN 4 MG/0.1ML LIQD nasal spray kit Inject solution in to one nostril for suspected overdose. Repeat in 3-5 minutes if necessary in the other nostril. CALL 911  0  . oxyCODONE (ROXICODONE) 15 MG immediate release tablet   0  . pravastatin (PRAVACHOL) 40 MG tablet Take 1 tablet (40 mg total) by mouth daily. 90 tablet 3  . Testosterone 20.25 MG/ACT (1.62%) GEL Apply 3 Pump topically daily. (Patient taking differently: Apply 4 Pump topically daily. ) 150 g 2  . tiZANidine (ZANAFLEX) 4 MG tablet Take 4 mg by mouth once.     . Marland KitchenICTOZA 18 MG/3ML SOPN Inject 0.3 mLs (1.8 mg total) into the skin daily. (54/1.8=30) 9 mL 3  . zolpidem (AMBIEN CR) 12.5 MG CR tablet Take one tablet by mouth at Bedtime 30 tablet 1  . allopurinol (ZYLOPRIM) 100 MG tablet Take 1 tablet (100 mg total) by mouth daily. 30[place]PPOINTMENT AND LABS REQUIRED FOR REFILLS. 30 tablet 0   No facility-administered medications prior to visit.    Allergies  Allergen Reactions  . Colchicine Shortness Of Breath, Swelling and Itching  . Prunus Persica Swelling    "Peaches" " swelling of face"  . Peach Flavor      "Peaches" " swelling of face"    ROS Review of Systems    Objective:    Physical Exam  Constitutional: He is oriented to person, place, and time. He appears well-developed and well-nourished.  HENT:  Head: Normocephalic and atraumatic.  Cardiovascular: Normal rate, regular rhythm and normal heart sounds.  Pulmonary/Chest: Effort normal and breath sounds normal.  Neurological: He is alert and oriented to person, place, and time.  Skin: Skin is warm and dry.  Psychiatric: He has a normal mood and affect. His behavior is normal.    BP 122/75   Pulse 66   Ht _0  (1.753 m)   Wt (!) 339 lb (153.8 kg)   SpO2 95%   BMI 50.06 kg/m  Wt Readings from Last 3 Encounters:  07/28/19 (!) 339 lb (153.8 kg)  04/22/19 (!) 330 lb (149.7 kg)  08/05/18 (!) 317 lb (143.8 kg)     There are no preventive care reminders to display for this patient.  There are no preventive care reminders to display for this patient.  Lab Results  Component Value Date   TSH 1.72 06/04/2019   Lab Results  Component Value Date   WBC 4.7 08/03/2018   HGB 12.5 (L) 08/03/2018   HCT 39.5 08/03/2018   MCV 85.9 08/03/2018   PLT 203 08/03/2018   Lab Results  Component Value Date   NA 141 06/04/2019   K 4.6 06/04/2019   CO2 27 06/04/2019   GLUCOSE 134 (H) 06/04/2019   BUN 21 06/04/2019   CREATININE  1.38 (H) 06/04/2019   BILITOT 0.6 06/04/2019   ALKPHOS 87 04/24/2017   AST 21 06/04/2019   ALT 17 06/04/2019   PROT 6.6 06/04/2019   ALBUMIN 4.1 04/22/2017   CALCIUM 9.1 06/04/2019   Lab Results  Component Value Date   CHOL 145 06/04/2019   Lab Results  Component Value Date   HDL 41 06/04/2019   Lab Results  Component Value Date   LDLCALC 84 06/04/2019   Lab Results  Component Value Date   TRIG 104 06/04/2019   Lab Results  Component Value Date   CHOLHDL 3.5 06/04/2019   Lab Results  Component Value Date   HGBA1C 6.2 (H) 06/04/2019      Assessment & Plan:   Problem List Items  Addressed This Visit      Cardiovascular and Mediastinum   ESSENTIAL HYPERTENSION, BENIGN    Blood pressure looks absolutely fantastic today.  He is following up with his cardiologist later today.        Endocrine   Controlled diabetes mellitus type 2 with complications ([place]) - Primary    Last A1C looks great!! F/U in 3 months.  Did encourage him to get back into the gym and try to start working on losing weight again.        Musculoskeletal and Integument   Panniculitis    He was actually scheduled for surgery for panniculectomy right before his insurance change.  Now that it is switched again he actually has a consult lined up for May with the plastic surgeon.        Other   Attention deficit disorder (ADD) without hyperactivity    Doing well with Adderall 30 mg.  We just refilled his medication about a week ago.  Follow-up in 3 months.       Other Visit Diagnoses    Acute pain of left knee         Family history of thyroid disorder-reassured him that we will check a TSH yearly to keep an eye on this.  Acute pain of left knee-we will try to get him in with our sports med doc later this week.  I do not want it limiting his ability to exercise because he is actually been able to make really great strides with weight loss since he has been exercising.  He did let me know that his girlfriend from [place] may actually be moving to the local area.  Meds ordered this encounter  Medications  . allopurinol (ZYLOPRIM) 100 MG tablet    Sig: Take 1 tablet (100 mg total) by mouth daily.    Dispense:  90 tablet    Refill:  3    Follow-up: Return for Diabetes follow-up.    Beatrice Lecher, MD

## 2019-07-28 NOTE — Assessment & Plan Note (Signed)
Blood pressure looks absolutely fantastic today.  He is following up with his cardiologist later today.

## 2019-07-29 DIAGNOSIS — M539 Dorsopathy, unspecified: Secondary | ICD-10-CM | POA: Diagnosis not present

## 2019-07-29 DIAGNOSIS — Z79899 Other long term (current) drug therapy: Secondary | ICD-10-CM | POA: Diagnosis not present

## 2019-07-29 DIAGNOSIS — M5417 Radiculopathy, lumbosacral region: Secondary | ICD-10-CM | POA: Diagnosis not present

## 2019-07-29 DIAGNOSIS — G5603 Carpal tunnel syndrome, bilateral upper limbs: Secondary | ICD-10-CM | POA: Diagnosis not present

## 2019-07-29 DIAGNOSIS — G8918 Other acute postprocedural pain: Secondary | ICD-10-CM | POA: Diagnosis not present

## 2019-07-30 ENCOUNTER — Ambulatory Visit (INDEPENDENT_AMBULATORY_CARE_PROVIDER_SITE_OTHER): Payer: BC Managed Care – PPO | Admitting: Sports Medicine

## 2019-07-30 ENCOUNTER — Other Ambulatory Visit: Payer: Self-pay

## 2019-07-30 DIAGNOSIS — M1712 Unilateral primary osteoarthritis, left knee: Secondary | ICD-10-CM | POA: Diagnosis not present

## 2019-07-30 DIAGNOSIS — M170 Bilateral primary osteoarthritis of knee: Secondary | ICD-10-CM | POA: Insufficient documentation

## 2019-07-30 MED ORDER — ACETAMINOPHEN ER 650 MG PO TBCR: 650.0000 mg | EXTENDED_RELEASE_TABLET | Freq: Three times a day (TID) | ORAL | 3 refills | Status: AC | PRN

## 2019-07-30 NOTE — Progress Notes (Signed)
    Procedures performed today:    None.  Independent interpretation of notes and tests performed by another provider:   None.  Brief History, Exam, Impression, and Recommendations:    Primary osteoarthritis of left knee This is a pleasant 59 year old male, he has a long history of left knee pain, he had an MRI in 2010 and 2012, both of which showed some osteoarthritis. He is having an increase in pain over the past couple of weeks, he has been doing a lot of exercising and walking. Pain is worse at the medial joint line, he also has a moderate effusion. He does desire to start conservative treatment, due to Roux-en-Y gastric bypass we will avoid NSAIDs, arthritis from Tylenol will be added, baseline x-rays, aquatic physical therapy. Return to see me in a couple weeks, we can do an aspiration and injection if no better.    ___________________________________________ Ihor Austin. Benjamin Stain, M.D., ABFM., CAQSM. Primary Care and Sports Medicine [facility]  Adjunct Instructor of Family Medicine  [employer]

## 2019-07-30 NOTE — Assessment & Plan Note (Signed)
This is a pleasant 59 year old male, he has a long history of left knee pain, he had an MRI in 2010 and 2012, both of which showed some osteoarthritis. He is having an increase in pain over the past couple of weeks, he has been doing a lot of exercising and walking. Pain is worse at the medial joint line, he also has a moderate effusion. He does desire to start conservative treatment, due to Roux-en-Y gastric bypass we will avoid NSAIDs, arthritis from Tylenol will be added, baseline x-rays, aquatic physical therapy. Return to see me in a couple weeks, we can do an aspiration and injection if no better.

## 2019-08-10 ENCOUNTER — Other Ambulatory Visit: Payer: Self-pay

## 2019-08-10 ENCOUNTER — Ambulatory Visit (INDEPENDENT_AMBULATORY_CARE_PROVIDER_SITE_OTHER): Payer: BC Managed Care – PPO | Admitting: Plastic Surgery

## 2019-08-10 ENCOUNTER — Encounter: Payer: Self-pay | Admitting: Plastic Surgery

## 2019-08-10 VITALS — BP 117/75 | HR 67 | Temp 98.3°F | Ht 69.0 in | Wt 324.0 lb

## 2019-08-10 DIAGNOSIS — M545 Low back pain, unspecified: Secondary | ICD-10-CM

## 2019-08-10 DIAGNOSIS — Z9884 Bariatric surgery status: Secondary | ICD-10-CM

## 2019-08-10 DIAGNOSIS — F3341 Major depressive disorder, recurrent, in partial remission: Secondary | ICD-10-CM | POA: Diagnosis not present

## 2019-08-10 DIAGNOSIS — K912 Postsurgical malabsorption, not elsewhere classified: Secondary | ICD-10-CM | POA: Diagnosis not present

## 2019-08-10 DIAGNOSIS — Z903 Acquired absence of stomach [part of]: Secondary | ICD-10-CM

## 2019-08-10 DIAGNOSIS — G8929 Other chronic pain: Secondary | ICD-10-CM

## 2019-08-10 DIAGNOSIS — M793 Panniculitis, unspecified: Secondary | ICD-10-CM

## 2019-08-10 NOTE — Progress Notes (Signed)
Patient ID: Rodney Bright, male    DOB: 11/10/1960, 59 y.o.   MRN: [id number]   Chief Complaint  Patient presents with  . Skin Problem    The patient is a 59 year old male here for evaluation of his pannus.  He is still living with his sister but she did not accompany him today.  The patient underwent a Roux-en-Y procedure in 2003.  At the time he was over 600 pounds.  He has been really good about keeping his weight around 300 pounds.  Today he is 324 pounds.  He is 5 feet 9 inches tall.  His most recent hemoglobin A1c was 6.2 from February 2021.  This is up from his last hemoglobin A1c.  He complains of constant redness and irritation in the creases of his abdomen, pannus and periinguinal area as well as in his suprapubic area.  He has tried powders and creams with some temporary relief but nothing that cures it completely.  It is worse in the summer.  He is currently a patient at the [facility] here in [place].  He takes 90 mg of Roxie.  His current pain involves his left knee.  He is being evaluated for a possible scope of the knee.  There is skin irritation in the areas that he described.  I do not see any signs of infection.  I do not feel any hernia.   Review of Systems  Constitutional: Negative.  Negative for activity change and appetite change.  HENT: Negative.   Eyes: Negative.   Respiratory: Negative for chest tightness and shortness of breath.   Cardiovascular: Negative for leg swelling.  Gastrointestinal: Negative.  Negative for abdominal distention and abdominal pain.  Endocrine: Negative.   Genitourinary: Negative.   Musculoskeletal: Positive for back pain.  Neurological: Negative.   Psychiatric/Behavioral: Negative.     Past Medical History:  Diagnosis Date  . Anemia   . Diabetes mellitus    controlled  . ED (erectile dysfunction)   . Gout   . Kidney stones    stent, lithotripsy  . Lymphadenopathy   . Morbid obesity ([place])   . OSA (obstructive  sleep apnea)    CPAP-17.5 cm water pressure  . Paroxysmal atrial fibrillation (HCC)   . Pneumonia   . S/P emergency tracheotomy for assistance in breathing [facility])    at age 33  . Testosterone deficiency   . Vitamin D deficiency     Past Surgical History:  Procedure Laterality Date  . CHOLECYSTECTOMY    . CYSTOSCOPY     retrograde and double J catheter insertion.  Marland Kitchen FINGER SURGERY     ulnar digital nerve and artery right index finger  . GASTRIC BYPASS  2005   600 lbs prior to surgery  . TONSILLECTOMY        Current Outpatient Medications:  .  acetaminophen (TYLENOL) 650 MG CR tablet, Take 1 tablet (650 mg total) by mouth every 8 (eight) hours as needed for pain., Disp: 90 tablet, Rfl: 3 .  alfuzosin (UROXATRAL) 10 MG 24 hr tablet, Take 10 mg by mouth daily., Disp: , Rfl: 10 .  allopurinol (ZYLOPRIM) 100 MG tablet, Take 1 tablet (100 mg total) by mouth daily., Disp: 90 tablet, Rfl: 3 .  AMBULATORY NON FORMULARY MEDICATION, Medication Name: CPAP machine with nasal mask and supplies set to 17.5 cm water pressure.  No humidifier.  Sent to [facility].  See sleep study from 2009 attached.  Dx: OSA., Disp: 1  Units, Rfl: PRN. Marland Kitchen  AMBULATORY NON FORMULARY MEDICATION, Medication Name: Cyclobenzaprine 2% gabapentin 3% Baclofen 2%  Diclofenac 3% Qty# 120 ml Sig: apply small amount topically to effected area 2-3 times a day as directed Fax to [phone], Disp: 120 mL, Rfl: 0 .  amiodarone (PACERONE) 200 MG tablet, Take 1 tablet (200 mg total) by mouth daily. KEEP OV., Disp: 30 tablet, Rfl: 0 .  amphetamine-dextroamphetamine (ADDERALL) 30 MG tablet, Take 1 tablet by mouth 2 (two) times daily., Disp: 60 tablet, Rfl: 0 .  amphetamine-dextroamphetamine (ADDERALL) 30 MG tablet, Take 1 tablet by mouth 2 (two) times daily., Disp: 60 tablet, Rfl: 0 .  amphetamine-dextroamphetamine (ADDERALL) 30 MG tablet, Take one tablet by mouth twice daily, Disp: 60 tablet, Rfl: 0 .  amphetamine-dextroamphetamine (ADDERALL)  30 MG tablet, Take one tablet by mouth twice daily, Disp: , Rfl:  .  aspirin EC 81 MG tablet, Take 1 tablet (81 mg total) by mouth daily., Disp: 90 tablet, Rfl: 3 .  blood glucose meter kit and supplies KIT, Dispense based on patient and insurance preference. Use up to four times daily as directed. (FOR ICD-9 250.00, 250.01)., Disp: 1 each, Rfl: 0 .  buPROPion (WELLBUTRIN XL) 300 MG 24 hr tablet, Take 1 tablet (300 mg total) by mouth daily., Disp: 30 tablet, Rfl: 3 .  buPROPion (WELLBUTRIN) 100 MG tablet, Take 1 tablet (100 mg total) by mouth daily. Add to Wellbutrin '300mg'$  already taking., Disp: 30 tablet, Rfl: 3 .  diltiazem (CARDIZEM CD) 240 MG 24 hr capsule, Take 1 capsule (240 mg total) by mouth daily. Keep appointment for future refills., Disp: 40 capsule, Rfl: 0 .  EASY COMFORT PEN NEEDLES 31G X 8 MM MISC, use once a day with victoza, Disp: , Rfl:  .  EASY PLUS II GLUCOSE TEST test strip, use to test blood sugar once daily, Disp: 100 each, Rfl: 11 .  escitalopram (LEXAPRO) 20 MG tablet, Take '20mg'$  tablets twice a day., Disp: 60 tablet, Rfl: 3 .  gabapentin (NEURONTIN) 300 MG capsule, Take 300 mg by mouth at bedtime., Disp: , Rfl:  .  NARCAN 4 MG/0.1ML LIQD nasal spray kit, Inject solution in to one nostril for suspected overdose. Repeat in 3-5 minutes if necessary in the other nostril. CALL 911, Disp: , Rfl: 0 .  oxyCODONE (ROXICODONE) 15 MG immediate release tablet, , Disp: , Rfl: 0 .  pravastatin (PRAVACHOL) 40 MG tablet, Take 1 tablet (40 mg total) by mouth daily., Disp: 90 tablet, Rfl: 3 .  Testosterone 20.25 MG/ACT (1.62%) GEL, Apply 3 Pump topically daily. (Patient taking differently: Apply 4 Pump topically daily. ), Disp: 150 g, Rfl: 2 .  tiZANidine (ZANAFLEX) 4 MG tablet, Take 4 mg by mouth once. , Disp: , Rfl:  .  VICTOZA 18 MG/3ML SOPN, Inject 0.3 mLs (1.8 mg total) into the skin daily. (54/1.8=30), Disp: 9 mL, Rfl: 3 .  zolpidem (AMBIEN CR) 12.5 MG CR tablet, Take one tablet by mouth  at Bedtime, Disp: 30 tablet, Rfl: 1   Objective:   Vitals:   08/10/19 0926  BP: 117/75  Pulse: 67  Temp: 98.3 F (36.8 C)  SpO2: 100%    Physical Exam Vitals and nursing note reviewed.  Constitutional:      Appearance: Normal appearance.  HENT:     Head: Normocephalic and atraumatic.  Cardiovascular:     Rate and Rhythm: Normal rate.     Pulses: Normal pulses.  Pulmonary:     Effort: Pulmonary effort is  normal. No respiratory distress.     Breath sounds: No wheezing.  Abdominal:     General: Abdomen is flat. There is no distension.     Tenderness: There is no abdominal tenderness.  Skin:    General: Skin is warm.     Capillary Refill: Capillary refill takes less than 2 seconds.  Neurological:     General: No focal deficit present.     Mental Status: He is alert and oriented to person, place, and time.  Psychiatric:        Mood and Affect: Mood normal.        Behavior: Behavior normal.        Thought Content: Thought content normal.     Assessment & Plan:  Panniculitis  Postgastrectomy malabsorption  Recurrent major depressive disorder, in partial remission (HCC)  Bariatric surgery status  Chronic low back pain, unspecified back pain laterality, unspecified whether sciatica present  Chronic bilateral low back pain without sciatica  Recommend panniculectomy and a team approach I will discuss with Dr. Claudia Desanctis and see if he is open to the possibility.  If so we will submit to insurance for preauthorization.  If a team approach will not be possible here then we will plan on referral.  I did discuss with the patient that he has a very high likelihood of complications after surgery.  Mostly with slow healing.  This would require dressing changes and wound care which could possibly take up to 3 months to heal  Pictures were obtained of the patient and placed in the chart with the patient's or guardian's permission.     [place], DO

## 2019-08-11 ENCOUNTER — Ambulatory Visit: Payer: BC Managed Care – PPO | Admitting: Sports Medicine

## 2019-08-11 DIAGNOSIS — F4323 Adjustment disorder with mixed anxiety and depressed mood: Secondary | ICD-10-CM | POA: Diagnosis not present

## 2019-08-12 ENCOUNTER — Ambulatory Visit: Payer: BC Managed Care – PPO | Admitting: Sports Medicine

## 2019-08-12 ENCOUNTER — Ambulatory Visit (INDEPENDENT_AMBULATORY_CARE_PROVIDER_SITE_OTHER): Payer: BC Managed Care – PPO | Admitting: Sports Medicine

## 2019-08-12 ENCOUNTER — Other Ambulatory Visit: Payer: Self-pay

## 2019-08-12 DIAGNOSIS — M1712 Unilateral primary osteoarthritis, left knee: Secondary | ICD-10-CM | POA: Diagnosis not present

## 2019-08-12 NOTE — Assessment & Plan Note (Signed)
This is a pleasant 59 year old male, he has left knee osteoarthritis, we initially treated him conservatively, pain returns with persistent swelling and pain, aspiration and injection. Return to see me in a month. He is post Roux-en-Y gastric bypass, he used to be 600 pounds, we are avoiding NSAIDs.

## 2019-08-12 NOTE — Progress Notes (Signed)
    Procedures performed today:    Procedure: Real-time Ultrasound Guided aspiration/injection of left knee Device: Samsung HS60  Verbal informed consent obtained.  Time-out conducted.  Noted no overlying erythema, induration, or other signs of local infection.  Skin prepped in a sterile fashion.  Local anesthesia: Topical Ethyl chloride.  With sterile technique and under real time ultrasound guidance:  Using 18-gauge needle aspirated 41 cc of clear, straw-colored fluid, syringe switched and 1 cc Kenalog 40, 2 cc lidocaine, 2 cc bupivacaine injected easily Completed without difficulty  Pain immediately resolved suggesting accurate placement of the medication.  Advised to call if fevers/chills, erythema, induration, drainage, or persistent bleeding.  Images permanently stored and available for review in the ultrasound unit.  Impression: Technically successful ultrasound guided injection.  Independent interpretation of notes and tests performed by another provider:   None.  Brief History, Exam, Impression, and Recommendations:    Primary osteoarthritis of left knee This is a pleasant 59 year old male, he has left knee osteoarthritis, we initially treated him conservatively, pain returns with persistent swelling and pain, aspiration and injection. Return to see me in a month. He is post Roux-en-Y gastric bypass, he used to be 600 pounds, we are avoiding NSAIDs.    ___________________________________________ Ihor Austin. Benjamin Stain, M.D., ABFM., CAQSM. Primary Care and Sports Medicine [facility]  Adjunct Instructor of Family Medicine  [employer]

## 2019-08-16 ENCOUNTER — Telehealth (HOSPITAL_COMMUNITY): Payer: BC Managed Care – PPO | Admitting: Psychiatry

## 2019-08-16 ENCOUNTER — Ambulatory Visit (HOSPITAL_COMMUNITY): Payer: BC Managed Care – PPO | Admitting: Psychiatry

## 2019-08-17 ENCOUNTER — Telehealth: Payer: BC Managed Care – PPO | Admitting: Plastic Surgery

## 2019-08-23 ENCOUNTER — Other Ambulatory Visit: Payer: Self-pay | Admitting: Cardiology

## 2019-08-23 ENCOUNTER — Institutional Professional Consult (permissible substitution): Payer: BC Managed Care – PPO | Admitting: Plastic Surgery

## 2019-08-25 ENCOUNTER — Other Ambulatory Visit: Payer: Self-pay | Admitting: Family Medicine

## 2019-08-25 DIAGNOSIS — E291 Testicular hypofunction: Secondary | ICD-10-CM

## 2019-08-27 ENCOUNTER — Other Ambulatory Visit: Payer: Self-pay | Admitting: Cardiology

## 2019-08-27 ENCOUNTER — Other Ambulatory Visit: Payer: Self-pay | Admitting: Family Medicine

## 2019-08-27 NOTE — Telephone Encounter (Signed)
He already has a prescription on file to fill at the end of May.  Please see medication historical list.

## 2019-08-30 ENCOUNTER — Ambulatory Visit: Payer: BC Managed Care – PPO | Admitting: Sports Medicine

## 2019-08-30 DIAGNOSIS — M539 Dorsopathy, unspecified: Secondary | ICD-10-CM | POA: Diagnosis not present

## 2019-08-30 DIAGNOSIS — Z79899 Other long term (current) drug therapy: Secondary | ICD-10-CM | POA: Diagnosis not present

## 2019-08-30 DIAGNOSIS — M545 Low back pain: Secondary | ICD-10-CM | POA: Diagnosis not present

## 2019-08-30 DIAGNOSIS — G5603 Carpal tunnel syndrome, bilateral upper limbs: Secondary | ICD-10-CM | POA: Diagnosis not present

## 2019-08-30 DIAGNOSIS — M5417 Radiculopathy, lumbosacral region: Secondary | ICD-10-CM | POA: Diagnosis not present

## 2019-08-30 NOTE — Telephone Encounter (Signed)
Rx request sent to pharmacy.  

## 2019-09-03 ENCOUNTER — Ambulatory Visit (INDEPENDENT_AMBULATORY_CARE_PROVIDER_SITE_OTHER): Payer: BC Managed Care – PPO | Admitting: Sports Medicine

## 2019-09-03 ENCOUNTER — Encounter: Payer: Self-pay | Admitting: Sports Medicine

## 2019-09-03 DIAGNOSIS — M1712 Unilateral primary osteoarthritis, left knee: Secondary | ICD-10-CM | POA: Diagnosis not present

## 2019-09-03 NOTE — Progress Notes (Addendum)
    Procedures performed today:    Procedure: Real-time Ultrasound Guided  aspiration of left knee Device: Samsung HS60  Verbal informed consent obtained.  Time-out conducted.  Noted no overlying erythema, induration, or other signs of local infection.  Skin prepped in a sterile fashion.  Local anesthesia: Topical Ethyl chloride.  With sterile technique and under real time ultrasound guidance:  Using 18-gauge needle aspirated 23 cc of somewhat cloudy straw-colored fluid. Completed without difficulty  Pain immediately resolved suggesting accurate placement of the medication.  Advised to call if fevers/chills, erythema, induration, drainage, or persistent bleeding.  Images permanently stored and available for review in the ultrasound unit.  Impression: Technically successful ultrasound guided injection.  Independent interpretation of notes and tests performed by another provider:   None.  Brief History, Exam, Impression, and Recommendations:    Primary osteoarthritis and CPPD of left knee This is a very pleasant 59 year old male, he has a history of a Roux-en-Y gastric bypass. He is done really well with weight loss. Approximately 22 days ago we did an aspiration and injection, he improved considerably, he is having some recurrence of effusion with mild pain. We did an arthrocentesis today, I will go ahead and send this off for crystal analysis, he does have a strong family history of rheumatoid and psoriatic arthritis as well so we are going to do a full rheumatoid panel. He does not have any psoriatic plaques or nail pitting on exam today. Return as needed.  Fluid does show calcium pyrophosphate crystals suggestive of pseudogout, we can add colchicine if pain returns.    ___________________________________________ Ihor Austin. Benjamin Stain, M.D., ABFM., CAQSM. Primary Care and Sports Medicine [facility]  Adjunct Instructor of Family Medicine  [employer]

## 2019-09-03 NOTE — Assessment & Plan Note (Addendum)
This is a very pleasant 59 year old male, he has a history of a Roux-en-Y gastric bypass. He is done really well with weight loss. Approximately 22 days ago we did an aspiration and injection, he improved considerably, he is having some recurrence of effusion with mild pain. We did an arthrocentesis today, I will go ahead and send this off for crystal analysis, he does have a strong family history of rheumatoid and psoriatic arthritis as well so we are going to do a full rheumatoid panel. He does not have any psoriatic plaques or nail pitting on exam today. Return as needed.  Fluid does show calcium pyrophosphate crystals suggestive of pseudogout, we can add colchicine if pain returns.

## 2019-09-07 ENCOUNTER — Other Ambulatory Visit: Payer: Self-pay | Admitting: Family Medicine

## 2019-09-07 LAB — RHEUMATOID FACTOR (IGA, IGG, IGM)

## 2019-09-07 LAB — SYNOVIAL FLUID, CRYSTAL

## 2019-09-07 LAB — LUPUS(12) PANEL

## 2019-09-07 LAB — CYCLIC CITRUL PEPTIDE ANTIBODY, IGG

## 2019-09-07 LAB — CK

## 2019-09-07 LAB — SEDIMENTATION RATE

## 2019-09-08 ENCOUNTER — Encounter: Payer: Self-pay | Admitting: Plastic Surgery

## 2019-09-08 ENCOUNTER — Other Ambulatory Visit: Payer: Self-pay

## 2019-09-08 ENCOUNTER — Ambulatory Visit (INDEPENDENT_AMBULATORY_CARE_PROVIDER_SITE_OTHER): Payer: BC Managed Care – PPO | Admitting: Plastic Surgery

## 2019-09-08 VITALS — BP 125/85 | HR 71 | Temp 96.8°F | Ht 69.0 in | Wt 325.0 lb

## 2019-09-08 DIAGNOSIS — M793 Panniculitis, unspecified: Secondary | ICD-10-CM

## 2019-09-08 DIAGNOSIS — G609 Hereditary and idiopathic neuropathy, unspecified: Secondary | ICD-10-CM

## 2019-09-08 MED ORDER — AMBULATORY NON FORMULARY MEDICATION: 0 refills | Status: DC

## 2019-09-08 NOTE — Addendum Note (Signed)
Addended by: Jed Limerick on: 09/08/2019 05:11 PM   Modules accepted: Orders

## 2019-09-08 NOTE — Progress Notes (Signed)
Referring Provider Hali Marry, MD [place] [address] [place],  [place] [address]   CC: No chief complaint on file. Abdominal pannus  Rodney Bright is an 59 y.o. male.  HPI: Patient presents to discuss issues with his abdominal pannus.  He had gastric bypass surgery years ago and has lost some 100 pounds.  He has been at about his current weight for least the past year or so.  He does fluctuate by 10 to 15 pounds.  He has not had any other abdominal surgeries.  His main issue is the rashes beneath his pannus in addition to hygiene issues regarding to the excess suprapubic skin that affects his ability to urinate.  He says he will usually need to urinate sitting down and feels like he has limited evacuation of his bladder occasionally.  He has tried various over-the-counter topical treatments for the skin irritation with little success.  Allergies  Allergen Reactions  . Colchicine Shortness Of Breath, Swelling and Itching  . Prunus Persica Swelling    "Peaches" " swelling of face"  . Peach Flavor     "Peaches" " swelling of face"    Outpatient Encounter Medications as of 09/08/2019  Medication Sig Note  . acetaminophen (TYLENOL) 650 MG CR tablet Take 1 tablet (650 mg total) by mouth every 8 (eight) hours as needed for pain.   Marland Kitchen alfuzosin (UROXATRAL) 10 MG 24 hr tablet Take 10 mg by mouth daily.   Marland Kitchen allopurinol (ZYLOPRIM) 100 MG tablet Take 1 tablet (100 mg total) by mouth daily.   . AMBULATORY NON FORMULARY MEDICATION Medication Name: CPAP machine with nasal mask and supplies set to 17.5 cm water pressure.  No humidifier.  Sent to [facility].  See sleep study from 2009 attached.  Dx: OSA.   Marland Kitchen AMBULATORY NON FORMULARY MEDICATION Medication Name: Cyclobenzaprine 2% gabapentin 3% Baclofen 2%  Diclofenac 3% Qty# 120 ml Sig: apply small amount topically to effected area 2-3 times a day as directed Fax to [phone]   . amiodarone (PACERONE) 200 MG tablet Take 1 tablet  (200 mg total) by mouth daily. [place] Office visits.   Marland Kitchen amphetamine-dextroamphetamine (ADDERALL) 30 MG tablet Take 1 tablet by mouth 2 (two) times daily.   Marland Kitchen amphetamine-dextroamphetamine (ADDERALL) 30 MG tablet Take 1 tablet by mouth 2 (two) times daily.   Marland Kitchen amphetamine-dextroamphetamine (ADDERALL) 30 MG tablet Take one tablet by mouth twice daily   . [START ON 10/03/2019] amphetamine-dextroamphetamine (ADDERALL) 30 MG tablet Take one tablet by mouth twice daily   . aspirin EC 81 MG tablet Take 1 tablet (81 mg total) by mouth daily.   . blood glucose meter kit and supplies KIT Dispense based on patient and insurance preference. Use up to four times daily as directed. (FOR ICD-9 250.00, 250.01).   Marland Kitchen buPROPion (WELLBUTRIN XL) 300 MG 24 hr tablet Take 1 tablet (300 mg total) by mouth daily.   Marland Kitchen buPROPion (WELLBUTRIN) 100 MG tablet Take 1 tablet (100 mg total) by mouth daily. Add to Wellbutrin '300mg'$  already taking.   . diltiazem (CARDIZEM CD) 240 MG 24 hr capsule Take 1 capsule (240 mg total) by mouth daily. Keep appointment for future refills.   Marland Kitchen EASY COMFORT PEN NEEDLES 31G X 8 MM MISC use once a day with victoza   . EASY PLUS II GLUCOSE TEST test strip use to test blood sugar once daily   . escitalopram (LEXAPRO) 20 MG tablet Take '20mg'$  tablets twice a day.   Marland Kitchen  gabapentin (NEURONTIN) 300 MG capsule Take 300 mg by mouth at bedtime.   Marland Kitchen NARCAN 4 MG/0.1ML LIQD nasal spray kit Inject solution in to one nostril for suspected overdose. Repeat in 3-5 minutes if necessary in the other nostril. CALL 911   . oxyCODONE (ROXICODONE) 15 MG immediate release tablet    . pravastatin (PRAVACHOL) 40 MG tablet Take 1 tablet (40 mg total) by mouth daily.   . Testosterone 20.25 MG/ACT (1.62%) GEL Apply 3 Pump topically daily. (Patient taking differently: Apply 4 Pump topically daily. )   . tiZANidine (ZANAFLEX) 4 MG tablet Take 4 mg by mouth once.  12/23/2013: Received from: External Pharmacy  . VICTOZA 18 MG/3ML SOPN  Inject 0.3 mLs (1.8 mg total) into the skin daily. (54/1.8=30)   . zolpidem (AMBIEN CR) 12.5 MG CR tablet Take one tablet by mouth at Bedtime    No facility-administered encounter medications on file as of 09/08/2019.     Past Medical History:  Diagnosis Date  . Anemia   . Diabetes mellitus    controlled  . ED (erectile dysfunction)   . Gout   . Kidney stones    stent, lithotripsy  . Lymphadenopathy   . Morbid obesity ([place])   . OSA (obstructive sleep apnea)    CPAP-17.5 cm water pressure  . Paroxysmal atrial fibrillation (HCC)   . Pneumonia   . S/P emergency tracheotomy for assistance in breathing [facility])    at age 20  . Testosterone deficiency   . Vitamin D deficiency     Past Surgical History:  Procedure Laterality Date  . CHOLECYSTECTOMY    . CYSTOSCOPY     retrograde and double J catheter insertion.  Marland Kitchen FINGER SURGERY     ulnar digital nerve and artery right index finger  . GASTRIC BYPASS  2005   600 lbs prior to surgery  . TONSILLECTOMY      Family History  Problem Relation Age of Onset  . Cancer Mother        lung, heavy smoker  . Hypertension Mother   . Hyperlipidemia Mother   . Cancer Father        melanoma  . Aneurysm Father        cardiac  . Hyperlipidemia Father   . Hypertension Father   . Hyperlipidemia Sister   . Hypertension Sister   . Hyperlipidemia Brother   . Hypertension Brother   . Stroke Other     Social History   Social History Narrative   Drinks one pot of coffee daily.   Regular exercise-no, walks some      Lives in [place] with roommate.   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  Denies tobacco use  Review of Systems General: Denies fevers, chills, weight loss CV: Denies chest pain, shortness of breath, palpitations  Physical Exam Vitals with BMI 09/08/2019 08/10/2019 07/28/2019  Height '5\' 9"'$  '5\' 9"'$  '5\' 9"'$   Weight 325 lbs 324 lbs 339 lbs  BMI 47.97 01.60 10.93  Systolic 235 573 220  Diastolic 85 75  75  Pulse 71 67 66  Some encounter information is confidential and restricted. Go to Review Flowsheets activity to see all data.    General:  No acute distress,  Alert and oriented, Non-Toxic, Normal speech and affect Abdomen: He has a upper abdominal midline scar that goes approximately xiphoid to umbilicus.  The majority of his excess skin and fat is in the infra umbilical area.  He does  have an overhanging infraumbilical pannus.  Additionally he has excess skin and fat in the suprapubic skin that can cause a buried penis depending on the position.  Pressing down around the base of the penis does make it easily visible.  I do not appreciate any hernias.  Assessment/Plan Patient presents with symptomatic infraumbilical pannus in addition to symptomatic excess of suprapubic skin.  I think he would be a reasonably good candidate for a infraumbilical panniculectomy in addition to excision of excess skin in the suprapubic area.  I would try to thin out the skin around the base of his penis to make this better in terms of hygiene.  We discussed the surgery in detail including the risk that include bleeding, infection, damage to surrounding structures, need for additional procedures.  We discussed the limitations of the operation as it would not fully treat area superior to the umbilicus.  We discussed the potential for persistent contour irregularities, scarring, and wound healing problems.  We discussed the anticipated recovery time which should be about 6 weeks.  I would plan to use drains.  I also informed him that his pain management center would need to supply his narcotic pain medication.  He has had surgeries in the past and they have been very accommodating and helpful in dealing with his pain issues.  We will plan to submit this for approval shortly.  Cindra Presume 09/08/2019, 11:34 AM

## 2019-09-09 MED ORDER — AMBULATORY NON FORMULARY MEDICATION: 0 refills | Status: AC

## 2019-09-09 NOTE — Addendum Note (Signed)
Addended by: Jed Limerick on: 09/09/2019 03:00 PM   Modules accepted: Orders

## 2019-09-10 ENCOUNTER — Ambulatory Visit (INDEPENDENT_AMBULATORY_CARE_PROVIDER_SITE_OTHER): Payer: BC Managed Care – PPO

## 2019-09-10 ENCOUNTER — Telehealth: Payer: Self-pay | Admitting: Sports Medicine

## 2019-09-10 ENCOUNTER — Other Ambulatory Visit: Payer: Self-pay

## 2019-09-10 DIAGNOSIS — M1712 Unilateral primary osteoarthritis, left knee: Secondary | ICD-10-CM | POA: Diagnosis not present

## 2019-09-10 DIAGNOSIS — M1711 Unilateral primary osteoarthritis, right knee: Secondary | ICD-10-CM | POA: Diagnosis not present

## 2019-09-10 NOTE — Telephone Encounter (Signed)
Orthovisc approval please, left knee.

## 2019-09-13 NOTE — Telephone Encounter (Signed)
Submitted Orthovisc case - CF

## 2019-09-14 ENCOUNTER — Telehealth: Payer: Self-pay

## 2019-09-14 ENCOUNTER — Other Ambulatory Visit: Payer: Self-pay

## 2019-09-14 MED ORDER — AMPHETAMINE-DEXTROAMPHETAMINE 30 MG PO TABS: 1.0000 | ORAL_TABLET | Freq: Two times a day (BID) | ORAL | 0 refills | Status: DC

## 2019-09-14 MED ORDER — INDOMETHACIN 50 MG PO CAPS
50.0000 mg | ORAL_CAPSULE | Freq: Two times a day (BID) | ORAL | 0 refills | Status: DC
Start: 2019-09-14 — End: 2019-12-10

## 2019-09-14 NOTE — Telephone Encounter (Signed)
Tee wanted to know if Dr Benjamin Stain would prescribe indomethacin for the pain until the injections are approved.

## 2019-09-14 NOTE — Telephone Encounter (Signed)
Patient advised.

## 2019-09-14 NOTE — Telephone Encounter (Addendum)
Received Benefits Investigation Detail and patient is covered at 100% but medication does require PA. I filled out forms and placed in providers box for signature I will fax to [facility] once signed I will fax back - CF

## 2019-09-14 NOTE — Telephone Encounter (Signed)
Done

## 2019-09-14 NOTE — Telephone Encounter (Signed)
Rodney Bright needs a refill of Adderall. Last fill was April.

## 2019-09-14 NOTE — Telephone Encounter (Signed)
No problem, sending in indomethacin. Cannot take any other NSAIDs when taking indomethacin and should take with food on stomach.

## 2019-09-16 ENCOUNTER — Other Ambulatory Visit: Payer: Self-pay

## 2019-09-16 MED ORDER — AMPHETAMINE-DEXTROAMPHETAMINE 30 MG PO TABS: 1.0000 | ORAL_TABLET | Freq: Two times a day (BID) | ORAL | 0 refills | Status: DC

## 2019-09-16 NOTE — Telephone Encounter (Signed)
The Adderall prescription that was sent on 09/14/2019 was for 30 capsules instead of 60. I did cancel the prescription of the Adderall #30.

## 2019-09-16 NOTE — Telephone Encounter (Signed)
Received fax from [facility] and they denied coverage of Orthovisc they would like for the patient to try Specifically Durolane or Gelsyn 3 first or Synvisc. Placing in providers box for review. - CF  Reference: [id number]

## 2019-09-17 MED ORDER — SYNVISC 16 MG/2ML IX SOSY: PREFILLED_SYRINGE | INTRA_ARTICULAR | 3 refills | Status: DC

## 2019-09-17 NOTE — Telephone Encounter (Signed)
Okay, looks like [facility], sending in Synvisc.

## 2019-09-17 NOTE — Telephone Encounter (Addendum)
Happy to send in Synvisc but what is his specialty pharmacy?

## 2019-09-17 NOTE — Addendum Note (Signed)
Addended by: Monica Becton on: 09/17/2019 05:36 PM   Modules accepted: Orders

## 2019-09-20 ENCOUNTER — Telehealth: Payer: Self-pay

## 2019-09-20 NOTE — Telephone Encounter (Signed)
Received fax from [facility] needing a prior auth for Synvisc sent through cover my meds waiting on determination. - CF

## 2019-09-20 NOTE — Telephone Encounter (Signed)
Bill called to see of we have received the paperwork/forms from the skin surgeon, Dr Hester Mates.

## 2019-09-21 NOTE — Telephone Encounter (Signed)
I haven't seen any form/paperwork.

## 2019-09-22 NOTE — Telephone Encounter (Signed)
Received fax from [facility] they needed a little more information filled out form got providers signature and faxed back - CF

## 2019-09-23 ENCOUNTER — Other Ambulatory Visit: Payer: Self-pay | Admitting: Family Medicine

## 2019-09-27 ENCOUNTER — Telehealth: Payer: Self-pay

## 2019-09-27 NOTE — Progress Notes (Signed)
HPI: FU atrial fibrillation. Echocardiogram in March of 2014 showed an ejection fraction of 40-45%, mild left atrial enlargement and grade 2 diastolic dysfunction. Study was technically difficult. Nuclear study in April of 2014 at [facility] showed an ejection fraction greater than 65% and normal perfusion. Patient had an outpatient monitor for recurrent palpitations which revealed wide-complex tachycardia. The patient was seen by Dr. Rayann Heman and this was felt to be atrial flutter with one-to-one conduction. He was started on flecainide. Followup exercise treadmill showed no induced ventricular arrhythmias but there was a rate related left bundle branch block. Flecanide changed to amiodarone previously at [facility]. Patient felt not to be a candidate for ablation due to size. Hospitalized at [facility] 8/15 with GI bleed and anticoagulation DCed. Patient did have a colonoscopy which revealed pandiverticulosis. He also was transfused 8 units of packed red blood cells. Discharge summary states anticoagulation can be resumed in one week if hemoglobin stable. Patient decided to continue aspirin and avoid Coumadin.Nuclear study March 2018 showed ejection fraction 34% and ischemia in the inferior, inferolateral and apical inferior walls.  At previous office visit we discussed options and elected conservative therapy.  It was felt that the inferior defect could certainly be related to his size.  Echocardiogram May 2018 showed normal LV function.  Since I last saw him,the patient denies any dyspnea on exertion, orthopnea, PND, pedal edema, palpitations, syncope or chest pain.   Current Outpatient Medications  Medication Sig Dispense Refill  . acetaminophen (TYLENOL) 650 MG CR tablet Take 1 tablet (650 mg total) by mouth every 8 (eight) hours as needed for pain. 90 tablet 3  . alfuzosin (UROXATRAL) 10 MG 24 hr tablet Take 10 mg by mouth daily.  10  . allopurinol (ZYLOPRIM) 100 MG tablet Take 1 tablet (100 mg  total) by mouth daily. 90 tablet 3  . AMBULATORY NON FORMULARY MEDICATION Medication Name: CPAP machine with nasal mask and supplies set to 17.5 cm water pressure.  No humidifier.  Sent to [facility].  See sleep study from 2009 attached.  Dx: OSA. 1 Units PRN.  Marland Kitchen AMBULATORY NON FORMULARY MEDICATION Medication Name: Cyclobenzaprine 2% gabapentin 3% Baclofen 2%  Diclofenac 3% Qty# 120 ml Sig: apply small amount topically to effected area 2-3 times a day as directed Fax to [phone] 120 mL 0  . amiodarone (PACERONE) 200 MG tablet Take 1 tablet (200 mg total) by mouth daily. [place] Office visits. 30 tablet 0  . [START ON 10/03/2019] amphetamine-dextroamphetamine (ADDERALL) 30 MG tablet Take one tablet by mouth twice daily 60 tablet 0  . amphetamine-dextroamphetamine (ADDERALL) 30 MG tablet Take 1 tablet by mouth 2 (two) times daily. 60 tablet 0  . aspirin EC 81 MG tablet Take 1 tablet (81 mg total) by mouth daily. 90 tablet 3  . blood glucose meter kit and supplies KIT Dispense based on patient and insurance preference. Use up to four times daily as directed. (FOR ICD-9 250.00, 250.01). 1 each 0  . buPROPion (WELLBUTRIN XL) 300 MG 24 hr tablet Take 1 tablet (300 mg total) by mouth daily. 30 tablet 3  . buPROPion (WELLBUTRIN) 100 MG tablet Take 1 tablet (100 mg total) by mouth daily. Add to Wellbutrin '300mg'$  already taking. 30 tablet 3  . diltiazem (CARDIZEM CD) 240 MG 24 hr capsule Take 1 capsule (240 mg total) by mouth daily. Keep appointment for future refills. 30 capsule 0  . EASY COMFORT PEN NEEDLES 31G X 8 MM MISC use once a  day with victoza    . EASY PLUS II GLUCOSE TEST test strip use to test blood sugar once daily 100 each 11  . escitalopram (LEXAPRO) 20 MG tablet Take '20mg'$  tablets twice a day. 60 tablet 3  . gabapentin (NEURONTIN) 300 MG capsule Take 300 mg by mouth at bedtime.    . Hylan (SYNVISC) 16 MG/2ML SOSY Inject 1 syringe into the knee weekly x3 6 mL 3  . indomethacin (INDOCIN) 50 MG  capsule Take 1 capsule (50 mg total) by mouth 2 (two) times daily with a meal. 60 capsule 0  . NARCAN 4 MG/0.1ML LIQD nasal spray kit Inject solution in to one nostril for suspected overdose. Repeat in 3-5 minutes if necessary in the other nostril. CALL 911  0  . oxyCODONE (ROXICODONE) 15 MG immediate release tablet   0  . pravastatin (PRAVACHOL) 40 MG tablet Take 1 tablet (40 mg total) by mouth daily. 90 tablet 3  . Testosterone 20.25 MG/ACT (1.62%) GEL Apply 3 Pump topically daily. (Patient taking differently: Apply 4 Pump topically daily. ) 150 g 2  . tiZANidine (ZANAFLEX) 4 MG tablet Take 4 mg by mouth once.     Marland Kitchen VICTOZA 18 MG/3ML SOPN Inject 0.3 mLs (1.8 mg total) into the skin daily. (54/1.8=30) 9 mL 3  . zolpidem (AMBIEN CR) 12.5 MG CR tablet Take one tablet by mouth at Bedtime 30 tablet 1   No current facility-administered medications for this visit.     Past Medical History:  Diagnosis Date  . Anemia   . Diabetes mellitus    controlled  . ED (erectile dysfunction)   . Gout   . Kidney stones    stent, lithotripsy  . Lymphadenopathy   . Morbid obesity ([place])   . OSA (obstructive sleep apnea)    CPAP-17.5 cm water pressure  . Paroxysmal atrial fibrillation (HCC)   . Pneumonia   . S/P emergency tracheotomy for assistance in breathing [facility])    at age 46  . Testosterone deficiency   . Vitamin D deficiency     Past Surgical History:  Procedure Laterality Date  . CHOLECYSTECTOMY    . CYSTOSCOPY     retrograde and double J catheter insertion.  Marland Kitchen FINGER SURGERY     ulnar digital nerve and artery right index finger  . GASTRIC BYPASS  2005   600 lbs prior to surgery  . TONSILLECTOMY      Social History   Socioeconomic History  . Marital status: Married    Spouse name: Not on file  . Number of children: Not on file  . Years of education: Not on file  . Highest education level: Not on file  Occupational History  . Not on file  Tobacco Use  . Smoking status: Never  Smoker  . Smokeless tobacco: Never Used  Substance and Sexual Activity  . Alcohol use: No  . Drug use: No    Comment: prior cocaine abuse (heavy) in his 74s.  denies subsequent use  . Sexual activity: Yes    Partners: Female    Birth control/protection: None    Comment: on full disability, separated, no regular exercise,walks some, drinks pot of coffee a day.  Other Topics Concern  . Not on file  Social History Narrative   Drinks one pot of coffee daily.   Regular exercise-no, walks some      Lives in [place] with roommate.   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  Social Determinants of Health   Financial Resource Strain:   . Difficulty of Paying Living Expenses:   Food Insecurity:   . Worried About Charity fundraiser in the Last Year:   . Arboriculturist in the Last Year:   Transportation Needs:   . Film/video editor (Medical):   Marland Kitchen Lack of Transportation (Non-Medical):   Physical Activity:   . Days of Exercise per Week:   . Minutes of Exercise per Session:   Stress:   . Feeling of Stress :   Social Connections:   . Frequency of Communication with Friends and Family:   . Frequency of Social Gatherings with Friends and Family:   . Attends Religious Services:   . Active Member of Clubs or Organizations:   . Attends Archivist Meetings:   Marland Kitchen Marital Status:   Intimate Partner Violence:   . Fear of Current or Ex-Partner:   . Emotionally Abused:   Marland Kitchen Physically Abused:   . Sexually Abused:     Family History  Problem Relation Age of Onset  . Cancer Mother        lung, heavy smoker  . Hypertension Mother   . Hyperlipidemia Mother   . Cancer Father        melanoma  . Aneurysm Father        cardiac  . Hyperlipidemia Father   . Hypertension Father   . Hyperlipidemia Sister   . Hypertension Sister   . Hyperlipidemia Brother   . Hypertension Brother   . Stroke Other     ROS: Knee pain but no fevers or chills,  productive cough, hemoptysis, dysphasia, odynophagia, melena, hematochezia, dysuria, hematuria, rash, seizure activity, orthopnea, PND, pedal edema, claudication. Remaining systems are negative.  Physical Exam: Well-developed obese in no acute distress.  Skin is warm and dry.  HEENT is normal.  Neck is supple.  Chest is clear to auscultation with normal expansion.  Cardiovascular exam is regular rate and rhythm.  Abdominal exam nontender or distended. No masses palpated. Extremities show no edema. neuro grossly intact  ECG-sinus rhythm at a rate of 62, cannot rule out anterior infarct.  Personally reviewed  A/P  1 paroxysmal atrial fibrillation-patient remains in sinus rhythm.  Continue amiodarone at present dose.  Check chest x-ray; recent TSH and liver functions normal.  As outlined in previous notes he had a life-threatening bleed on anticoagulation and he declines to try again.  He understands the higher risk of CVA.  We will continue aspirin at present.  Note patient's electrocardiogram-cannot rule out prior anterior infarct.  We will repeat echocardiogram.  2 hypertension-blood pressure controlled.  Continue present medical regimen.  3 history of chest pain-no recurrences.  Previous nuclear study suggestive of ischemia but may be false positive particularly in light of patient's weight.  Continue medical therapy.  4 morbid obesity-we discussed importance of diet, exercise and weight loss.  Kirk Ruths, MD

## 2019-09-27 NOTE — Telephone Encounter (Signed)
It looks like they were approved, sent to the specialty pharmacy, and we are waiting on shipment, I am going to add Arline Asp to make sure that I am seeing this correctly.    I am also going to write the letter, it can be printed out for him.

## 2019-09-27 NOTE — Telephone Encounter (Signed)
Patient called requesting a work note concerning knee with explanation of his situation. He has a pain clinic appointment on Wednesday.   Also, interested in knowing the status of injection approval.

## 2019-09-27 NOTE — Telephone Encounter (Signed)
Received Fax from [facility] and they approved Prior authorization on Synvisc. I am calling [facility] to let them know.  Ref Number: [id number] 09/20/2019 - 03/18/2020 - CF

## 2019-09-29 ENCOUNTER — Other Ambulatory Visit: Payer: Self-pay

## 2019-09-29 ENCOUNTER — Encounter: Payer: Self-pay | Admitting: Cardiology

## 2019-09-29 ENCOUNTER — Ambulatory Visit (INDEPENDENT_AMBULATORY_CARE_PROVIDER_SITE_OTHER): Payer: BLUE CROSS/BLUE SHIELD

## 2019-09-29 ENCOUNTER — Ambulatory Visit (INDEPENDENT_AMBULATORY_CARE_PROVIDER_SITE_OTHER): Payer: BLUE CROSS/BLUE SHIELD | Admitting: Cardiology

## 2019-09-29 VITALS — BP 140/89 | HR 62 | Ht 68.5 in | Wt 327.8 lb

## 2019-09-29 DIAGNOSIS — Z79899 Other long term (current) drug therapy: Secondary | ICD-10-CM | POA: Diagnosis not present

## 2019-09-29 DIAGNOSIS — I10 Essential (primary) hypertension: Secondary | ICD-10-CM | POA: Diagnosis not present

## 2019-09-29 DIAGNOSIS — I4891 Unspecified atrial fibrillation: Secondary | ICD-10-CM | POA: Diagnosis not present

## 2019-09-29 DIAGNOSIS — M539 Dorsopathy, unspecified: Secondary | ICD-10-CM | POA: Diagnosis not present

## 2019-09-29 DIAGNOSIS — M5417 Radiculopathy, lumbosacral region: Secondary | ICD-10-CM | POA: Diagnosis not present

## 2019-09-29 DIAGNOSIS — Z5181 Encounter for therapeutic drug level monitoring: Secondary | ICD-10-CM | POA: Diagnosis not present

## 2019-09-29 DIAGNOSIS — M545 Low back pain: Secondary | ICD-10-CM | POA: Diagnosis not present

## 2019-09-29 DIAGNOSIS — G5603 Carpal tunnel syndrome, bilateral upper limbs: Secondary | ICD-10-CM | POA: Diagnosis not present

## 2019-09-29 NOTE — Patient Instructions (Signed)
Medication Instructions:  NO CHANGE *If you need a refill on your cardiac medications before your next appointment, please call your pharmacy*   Lab Work: If you have labs (blood work) drawn today and your tests are completely normal, you will receive your results only by: Marland Kitchen MyChart Message (if you have MyChart) OR . A paper copy in the mail If you have any lab test that is abnormal or we need to change your treatment, we will call you to review the results.   Testing/Procedures: A chest x-ray takes a picture of the organs and structures inside the chest, including the heart, lungs, and blood vessels. This test can show several things, including, whether the heart is enlarges; whether fluid is building up in the lungs; and whether pacemaker / defibrillator leads are still in place. [place] OFFICE  Your physician has requested that you have an echocardiogram. Echocardiography is a painless test that uses sound waves to create images of your heart. It provides your doctor with information about the size and shape of your heart and how well your heart's chambers and valves are working. This procedure takes approximately one hour. There are no restrictions for this procedure.[place] OFFICE-1 ST FLOOR IMAGING   Follow-Up: At [facility], you and your health needs are our priority.  As part of our continuing mission to provide you with exceptional heart care, we have created designated Provider Care Teams.  These Care Teams include your primary Cardiologist (physician) and Advanced Practice Providers (APPs -  Physician Assistants and Nurse Practitioners) who all work together to provide you with the care you need, when you need it.  We recommend signing up for the patient portal called "MyChart".  Sign up information is provided on this After Visit Summary.  MyChart is used to connect with patients for Virtual Visits (Telemedicine).  Patients are able to view lab/test results, encounter  notes, upcoming appointments, etc.  Non-urgent messages can be sent to your provider as well.   To learn more about what you can do with MyChart, go to [email or web address].    Your next appointment:   6 month(s)  The format for your next appointment:   In Person  Provider:   Olga Millers, MD

## 2019-10-01 ENCOUNTER — Telehealth: Payer: Self-pay | Admitting: Sports Medicine

## 2019-10-01 NOTE — Telephone Encounter (Signed)
Rodney Bright requested for his knee to be drained. Sadly due to the schedule we aren't able to provide anything next week. Any recommendations?

## 2019-10-02 NOTE — Telephone Encounter (Signed)
I think we can double book him for an arthrocentesis.  We could also add colchicine, this may be very beneficial to keep the effusion from returning.  Let me know what he wants to do but we can double book him early anytime this week.

## 2019-10-04 ENCOUNTER — Ambulatory Visit (INDEPENDENT_AMBULATORY_CARE_PROVIDER_SITE_OTHER): Payer: BLUE CROSS/BLUE SHIELD | Admitting: Sports Medicine

## 2019-10-04 DIAGNOSIS — M1712 Unilateral primary osteoarthritis, left knee: Secondary | ICD-10-CM

## 2019-10-04 NOTE — Telephone Encounter (Signed)
I called [facility] to find out what the status was on the Synvisc.  I spoke with a supervisor Rodney Bright who stated they needed to get Rodney Bright's permission for shipment and go over any co pays with him. She tried to call him while I was on the phone so we could go ahead and schedule the shipment but it went straight to voicemail and his voicemail is full so she was unable to leave a message.  I asked her for the number to have Rodney Bright call if we can reach him she said to let him know to go ahead and ask for a supervisor as soon as he calls so he isnt on hold and we can speed the process up.  I tried to call Rodney Bright myself and it rang then went to Voicemail which was full and I was unable to leave a message. - CF

## 2019-10-04 NOTE — Telephone Encounter (Signed)
I spoke with [name] he states he spoke to [facility] and they are sending the medication on 10/13/19 - CF

## 2019-10-04 NOTE — Progress Notes (Signed)
    Procedures performed today:    Procedure: Real-time Ultrasound Guided  aspiration of left knee Device: Samsung HS60  Verbal informed consent obtained.  Time-out conducted.  Noted no overlying erythema, induration, or other signs of local infection.  Skin prepped in a sterile fashion.  Local anesthesia: Topical Ethyl chloride.  With sterile technique and under real time ultrasound guidance:  18-gauge needle advanced into the suprapatellar recess, I aspirated 45 cc of clear, straw-colored fluid.   Completed without difficulty  Pain immediately resolved suggesting accurate placement of the medication.  Advised to call if fevers/chills, erythema, induration, drainage, or persistent bleeding.  Images permanently stored and available for review in the ultrasound unit.  Impression: Technically successful ultrasound guided injection.  Independent interpretation of notes and tests performed by another provider:   None.  Brief History, Exam, Impression, and Recommendations:    Primary osteoarthritis and CPPD of left knee Rodney Bright is a very pleasant 59 year old male, he has left knee osteoarthritis, CPPD. We have been treating him for a flare, he has had steroid injections, we are trying to get Synvisc approved, just do not have the syringes yet, he is allergic to colchicine. 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 He does have a pain clinic, and has not surprisingly had to use a few more of his pills and will likely get an early refill from his pain clinic. Today I performed a therapeutic arthrocentesis, we are still awaiting his Synvisc syringes.    ___________________________________________ Rodney Bright. Rodney Bright, M.D., ABFM., CAQSM. Primary Care and Sports Medicine [facility]  Adjunct Instructor of Family Medicine  [employer]

## 2019-10-04 NOTE — Assessment & Plan Note (Addendum)
Rodney Bright is a very pleasant 59 year old male, he has left knee osteoarthritis, CPPD. We have been treating him for a flare, he has had steroid injections, we are trying to get Synvisc approved, just do not have the syringes yet, he is allergic to colchicine. 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 He does have a pain clinic, and has not surprisingly had to use a few more of his pills and will likely get an early refill from his pain clinic. Today I performed a therapeutic arthrocentesis, we are still awaiting his Synvisc syringes.

## 2019-10-04 NOTE — Telephone Encounter (Signed)
To Arline Asp, I sent in the Synvisc, [name] is still wondering where the syringes are?

## 2019-10-11 ENCOUNTER — Other Ambulatory Visit: Payer: Self-pay | Admitting: Cardiology

## 2019-10-13 DIAGNOSIS — M545 Low back pain: Secondary | ICD-10-CM | POA: Diagnosis not present

## 2019-10-13 DIAGNOSIS — F4323 Adjustment disorder with mixed anxiety and depressed mood: Secondary | ICD-10-CM | POA: Diagnosis not present

## 2019-10-13 DIAGNOSIS — M5417 Radiculopathy, lumbosacral region: Secondary | ICD-10-CM | POA: Diagnosis not present

## 2019-10-13 DIAGNOSIS — G5603 Carpal tunnel syndrome, bilateral upper limbs: Secondary | ICD-10-CM | POA: Diagnosis not present

## 2019-10-13 DIAGNOSIS — M1712 Unilateral primary osteoarthritis, left knee: Secondary | ICD-10-CM | POA: Diagnosis not present

## 2019-10-13 DIAGNOSIS — Z9181 History of falling: Secondary | ICD-10-CM | POA: Diagnosis not present

## 2019-10-13 DIAGNOSIS — Z79899 Other long term (current) drug therapy: Secondary | ICD-10-CM | POA: Diagnosis not present

## 2019-10-13 DIAGNOSIS — M539 Dorsopathy, unspecified: Secondary | ICD-10-CM | POA: Diagnosis not present

## 2019-10-15 ENCOUNTER — Ambulatory Visit (INDEPENDENT_AMBULATORY_CARE_PROVIDER_SITE_OTHER): Payer: BLUE CROSS/BLUE SHIELD | Admitting: Sports Medicine

## 2019-10-15 DIAGNOSIS — M1712 Unilateral primary osteoarthritis, left knee: Secondary | ICD-10-CM | POA: Diagnosis not present

## 2019-10-15 NOTE — Assessment & Plan Note (Addendum)
[name] returns, we now have his Synvisc syringes.  Today we did an aspiration and Synvisc injection #1 of 3, return in 1 week for #2 of 3 left knee.

## 2019-10-15 NOTE — Progress Notes (Signed)
    Procedures performed today:    Procedure: Real-time Ultrasound Guided aspiration/injection of the left knee Device: Samsung HS60  Verbal informed consent obtained.  Time-out conducted.  Noted no overlying erythema, induration, or other signs of local infection.  Skin prepped in a sterile fashion.  Local anesthesia: Topical Ethyl chloride.  With sterile technique and under real time ultrasound guidance:  Using 18-gauge needle aspirated 34 cc of serosanguineous fluid, syringe switched and Synvisc injected into the suprapatellar recess . Completed without difficulty  Pain immediately resolved suggesting accurate placement of the medication.  Advised to call if fevers/chills, erythema, induration, drainage, or persistent bleeding.  Images permanently stored and available for review in the ultrasound unit.  Impression: Technically successful ultrasound guided injection.  Independent interpretation of notes and tests performed by another provider:   None.  Brief History, Exam, Impression, and Recommendations:    Primary osteoarthritis and CPPD of left knee Rodney Bright returns, we now have his Synvisc syringes.  Today we did an aspiration and Synvisc injection #1 of 3, return in 1 week for #2 of 3 left knee.    ___________________________________________ Ihor Austin. Benjamin Stain, M.D., ABFM., CAQSM. Primary Care and Sports Medicine [facility]  Adjunct Instructor of Family Medicine  [employer]

## 2019-10-19 ENCOUNTER — Ambulatory Visit: Payer: BLUE CROSS/BLUE SHIELD | Admitting: Sports Medicine

## 2019-10-20 ENCOUNTER — Other Ambulatory Visit: Payer: Self-pay | Admitting: *Deleted

## 2019-10-20 DIAGNOSIS — E291 Testicular hypofunction: Secondary | ICD-10-CM

## 2019-10-20 MED ORDER — TESTOSTERONE 20.25 MG/ACT (1.62%) TD GEL: 4.0000 | Freq: Every day | TRANSDERMAL | 1 refills | Status: DC

## 2019-10-20 NOTE — Telephone Encounter (Signed)
Called pt and informed him that he will need to go have his testosterone checked since it was elevated last time. He informed me that he is "completely" out of the medication I told him that I would send the request to Dr. Linford Arnold for a signature and that he MUST go for his labs he said that he would go Thursday morning.  Lab ordered and faxed.

## 2019-10-21 ENCOUNTER — Other Ambulatory Visit: Payer: Self-pay | Admitting: Family Medicine

## 2019-10-21 ENCOUNTER — Ambulatory Visit: Payer: BLUE CROSS/BLUE SHIELD | Admitting: Sports Medicine

## 2019-10-22 ENCOUNTER — Ambulatory Visit (INDEPENDENT_AMBULATORY_CARE_PROVIDER_SITE_OTHER): Payer: BLUE CROSS/BLUE SHIELD | Admitting: Sports Medicine

## 2019-10-22 ENCOUNTER — Other Ambulatory Visit: Payer: Self-pay

## 2019-10-22 DIAGNOSIS — M1712 Unilateral primary osteoarthritis, left knee: Secondary | ICD-10-CM

## 2019-10-22 NOTE — Progress Notes (Signed)
    Procedures performed today:    Procedure: Real-time Ultrasound Guidedaspiration/injection of the left knee Device: Samsung HS60  Verbal informed consent obtained.  Time-out conducted.  Noted no overlying erythema, induration, or other signs of local infection.  Skin prepped in a sterile fashion.  Local anesthesia: Topical Ethyl chloride.  With sterile technique and under real time ultrasound guidance: Using 18-gauge needle aspirated 36 cc of serosanguineous fluid, syringe switched and Synvisc injected into the suprapatellar recess . Completed without difficulty  Pain immediately resolved suggesting accurate placement of the medication.  Advised to call if fevers/chills, erythema, induration, drainage, or persistent bleeding.  Images permanently stored and available for review in the ultrasound unit.  Impression: Technically successful ultrasound guided injection.  Independent interpretation of notes and tests performed by another provider:   None.  Brief History, Exam, Impression, and Recommendations:    Primary osteoarthritis and CPPD of left knee Aspiration and Synvisc injection #2 of 3 into the left knee, return in 1 week for #3 of 3, feeling significantly better already.    ___________________________________________ Ihor Austin. Benjamin Stain, M.D., ABFM., CAQSM. Primary Care and Sports Medicine [facility]  Adjunct Instructor of Family Medicine  [employer]

## 2019-10-22 NOTE — Assessment & Plan Note (Signed)
Aspiration and Synvisc injection #2 of 3 into the left knee, return in 1 week for #3 of 3, feeling significantly better already.

## 2019-10-22 NOTE — Telephone Encounter (Signed)
Please call pharmacy.  The last one had a refill on it so he just needs to pick up the refill.

## 2019-10-28 ENCOUNTER — Ambulatory Visit: Payer: BLUE CROSS/BLUE SHIELD | Admitting: Sports Medicine

## 2019-10-28 ENCOUNTER — Other Ambulatory Visit: Payer: Self-pay | Admitting: Family Medicine

## 2019-10-28 DIAGNOSIS — Z79899 Other long term (current) drug therapy: Secondary | ICD-10-CM | POA: Diagnosis not present

## 2019-10-28 DIAGNOSIS — G5603 Carpal tunnel syndrome, bilateral upper limbs: Secondary | ICD-10-CM | POA: Diagnosis not present

## 2019-10-28 DIAGNOSIS — Z9181 History of falling: Secondary | ICD-10-CM | POA: Diagnosis not present

## 2019-10-28 DIAGNOSIS — M5417 Radiculopathy, lumbosacral region: Secondary | ICD-10-CM | POA: Diagnosis not present

## 2019-10-28 DIAGNOSIS — M539 Dorsopathy, unspecified: Secondary | ICD-10-CM | POA: Diagnosis not present

## 2019-10-28 DIAGNOSIS — M545 Low back pain: Secondary | ICD-10-CM | POA: Diagnosis not present

## 2019-10-28 DIAGNOSIS — E119 Type 2 diabetes mellitus without complications: Secondary | ICD-10-CM

## 2019-10-29 ENCOUNTER — Ambulatory Visit (INDEPENDENT_AMBULATORY_CARE_PROVIDER_SITE_OTHER): Payer: BLUE CROSS/BLUE SHIELD | Admitting: Sports Medicine

## 2019-10-29 DIAGNOSIS — M1712 Unilateral primary osteoarthritis, left knee: Secondary | ICD-10-CM

## 2019-10-29 DIAGNOSIS — I8393 Asymptomatic varicose veins of bilateral lower extremities: Secondary | ICD-10-CM

## 2019-10-29 DIAGNOSIS — S66822S Laceration of other specified muscles, fascia and tendons at wrist and hand level, left hand, sequela: Secondary | ICD-10-CM | POA: Insufficient documentation

## 2019-10-29 NOTE — Assessment & Plan Note (Signed)
Aspiration and Synvisc injection #3 of 3 into the left knee, feeling really good. Return as needed for this. He is going to start some aquatic therapy.

## 2019-10-29 NOTE — Assessment & Plan Note (Signed)
Has tried compression hose, referral to vascular surgery for consideration of percutaneous venotomy

## 2019-10-29 NOTE — Addendum Note (Signed)
Addended by: Monica Becton on: 10/29/2019 10:09 AM   Modules accepted: Orders

## 2019-10-29 NOTE — Assessment & Plan Note (Signed)
Rodney Bright also wanted to talk to me about an injury he sustained sometime ago in his left hand, it sounds like he had a laceration, afterwards he has been unable to fully extend at the PIP and he has 0/5 flexion strength at the DIP suggestive of laceration of the flexor digitorum profundus. Proceeding with x-rays, MRI to determine the location of the FDP and hopefully we can send him for surgical repair. Weight is 327 pounds.

## 2019-10-29 NOTE — Progress Notes (Addendum)
    Procedures performed today:    Procedure: Real-time Ultrasound Guidedaspiration/injection of theleft knee Device: Samsung HS60  Verbal informed consent obtained.  Time-out conducted.  Noted no overlying erythema, induration, or other signs of local infection.  Skin prepped in a sterile fashion.  Local anesthesia: Topical Ethyl chloride.  With sterile technique and under real time ultrasound guidance:Using 18-gauge needle aspirated 36 cc of serosanguineous fluid, syringe switched andSynvisc injected into the suprapatellar recess. Completed without difficulty  Pain immediately resolved suggesting accurate placement of the medication.  Advised to call if fevers/chills, erythema, induration, drainage, or persistent bleeding.  Images permanently stored and available for review in the ultrasound unit.  Impression: Technically successful ultrasound guided injection.  Independent interpretation of notes and tests performed by another provider:   None.  Brief History, Exam, Impression, and Recommendations:    Primary osteoarthritis and CPPD of left knee Aspiration and Synvisc injection #3 of 3 into the left knee, feeling really good. Return as needed for this. He is going to start some aquatic therapy.  Laceration of flexor tendon of hand, left, sequela Bill also wanted to talk to me about an injury he sustained sometime ago in his left hand, it sounds like he had a laceration, afterwards he has been unable to fully extend at the PIP and he has 0/5 flexion strength at the DIP suggestive of laceration of the flexor digitorum profundus. Proceeding with x-rays, MRI to determine the location of the FDP and hopefully we can send him for surgical repair. Weight is 327 pounds.  Varicose vein of leg Has tried compression hose, referral to vascular surgery for consideration of percutaneous venotomy    ___________________________________________ Ihor Austin. Benjamin Stain, M.D., ABFM.,  CAQSM. Primary Care and Sports Medicine [facility]  Adjunct Instructor of Family Medicine  [employer]

## 2019-11-02 ENCOUNTER — Telehealth: Payer: Self-pay

## 2019-11-02 NOTE — Telephone Encounter (Signed)
Rodney Bright called and states his insurance will pay for another 3 injections for his knee.

## 2019-11-05 ENCOUNTER — Ambulatory Visit (HOSPITAL_BASED_OUTPATIENT_CLINIC_OR_DEPARTMENT_OTHER): Admission: RE | Admit: 2019-11-05 | Payer: BLUE CROSS/BLUE SHIELD | Source: Ambulatory Visit

## 2019-11-10 ENCOUNTER — Ambulatory Visit: Payer: BC Managed Care – PPO | Admitting: Plastic Surgery

## 2019-11-11 ENCOUNTER — Telehealth: Payer: Self-pay

## 2019-11-11 NOTE — Telephone Encounter (Signed)
Patient inquiring about some notes from Dr Linford Arnold stating treatments he has received in the past for skin conditions. He states he asked for this a while ago but never heard anything back about it.   Do we know a status update? I don't see anything in the chart about it

## 2019-11-11 NOTE — Telephone Encounter (Signed)
I apologize, we will need moe info.  Does he need copies of notes? Etc? Not sure?

## 2019-11-12 DIAGNOSIS — Z79899 Other long term (current) drug therapy: Secondary | ICD-10-CM | POA: Diagnosis not present

## 2019-11-12 DIAGNOSIS — M1712 Unilateral primary osteoarthritis, left knee: Secondary | ICD-10-CM | POA: Diagnosis not present

## 2019-11-12 DIAGNOSIS — M5417 Radiculopathy, lumbosacral region: Secondary | ICD-10-CM | POA: Diagnosis not present

## 2019-11-12 DIAGNOSIS — M539 Dorsopathy, unspecified: Secondary | ICD-10-CM | POA: Diagnosis not present

## 2019-11-12 DIAGNOSIS — G5603 Carpal tunnel syndrome, bilateral upper limbs: Secondary | ICD-10-CM | POA: Diagnosis not present

## 2019-11-12 DIAGNOSIS — M545 Low back pain: Secondary | ICD-10-CM | POA: Diagnosis not present

## 2019-11-17 ENCOUNTER — Other Ambulatory Visit: Payer: Self-pay

## 2019-11-17 ENCOUNTER — Ambulatory Visit (INDEPENDENT_AMBULATORY_CARE_PROVIDER_SITE_OTHER): Payer: BLUE CROSS/BLUE SHIELD | Admitting: Sports Medicine

## 2019-11-17 ENCOUNTER — Ambulatory Visit (INDEPENDENT_AMBULATORY_CARE_PROVIDER_SITE_OTHER): Payer: BLUE CROSS/BLUE SHIELD

## 2019-11-17 DIAGNOSIS — M1712 Unilateral primary osteoarthritis, left knee: Secondary | ICD-10-CM | POA: Diagnosis not present

## 2019-11-17 DIAGNOSIS — S66822S Laceration of other specified muscles, fascia and tendons at wrist and hand level, left hand, sequela: Secondary | ICD-10-CM

## 2019-11-17 DIAGNOSIS — M79642 Pain in left hand: Secondary | ICD-10-CM | POA: Diagnosis not present

## 2019-11-17 NOTE — Progress Notes (Signed)
    Procedures performed today:    None.  Independent interpretation of notes and tests performed by another provider:   None.  Brief History, Exam, Impression, and Recommendations:    Primary osteoarthritis and CPPD of left knee Rodney Bright returns, he is a very pleasant 59 year old male with left knee osteoarthritis, at the last visit we finished Synvisc injection #3 of 3, he is doing really well. We did get delivery of another 3 Synvisc syringes which we can simply hold onto until he needs them. He will also continue with aquatic therapy.    ___________________________________________ Rodney Bright. Rodney Bright, M.D., ABFM., CAQSM. Primary Care and Sports Medicine [facility]  Adjunct Instructor of Family Medicine  [employer]

## 2019-11-17 NOTE — Assessment & Plan Note (Signed)
Rodney Bright returns, he is a very pleasant 59 year old male with left knee osteoarthritis, at the last visit we finished Synvisc injection #3 of 3, he is doing really well. We did get delivery of another 3 Synvisc syringes which we can simply hold onto until he needs them. He will also continue with aquatic therapy.

## 2019-11-17 NOTE — Telephone Encounter (Signed)
See note below. In addition needs diabetes f/u app

## 2019-11-18 NOTE — Telephone Encounter (Signed)
Spoke w/pt yesterday after his visit with Dr. Benjamin Stain and asked about what exactly he needed and printed off the information that he needed.   Will call and advise him to schedule a f/u with pcp for DM management.   Called pt and had to LVM asked that he RTN call to schedule this.

## 2019-11-18 NOTE — Telephone Encounter (Signed)
Information faxed to Dr. Kittie Plater office

## 2019-11-19 ENCOUNTER — Other Ambulatory Visit: Payer: Self-pay | Admitting: Family Medicine

## 2019-11-26 DIAGNOSIS — G5603 Carpal tunnel syndrome, bilateral upper limbs: Secondary | ICD-10-CM | POA: Diagnosis not present

## 2019-11-26 DIAGNOSIS — M545 Low back pain: Secondary | ICD-10-CM | POA: Diagnosis not present

## 2019-11-26 DIAGNOSIS — M5417 Radiculopathy, lumbosacral region: Secondary | ICD-10-CM | POA: Diagnosis not present

## 2019-11-26 DIAGNOSIS — Z79899 Other long term (current) drug therapy: Secondary | ICD-10-CM | POA: Diagnosis not present

## 2019-11-26 DIAGNOSIS — M539 Dorsopathy, unspecified: Secondary | ICD-10-CM | POA: Diagnosis not present

## 2019-11-30 ENCOUNTER — Other Ambulatory Visit: Payer: Self-pay | Admitting: Family Medicine

## 2019-11-30 ENCOUNTER — Telehealth (HOSPITAL_COMMUNITY): Payer: Self-pay | Admitting: Psychiatry

## 2019-11-30 DIAGNOSIS — E291 Testicular hypofunction: Secondary | ICD-10-CM

## 2019-11-30 NOTE — Telephone Encounter (Signed)
I havent given any meds since January refills. Is he still active our patients or was discharged

## 2019-11-30 NOTE — Telephone Encounter (Signed)
Yes, he is still a patient and has an appt tomorrow.

## 2019-11-30 NOTE — Telephone Encounter (Signed)
Called patient. Lm informing him refills will have to wait until tomorrow.

## 2019-11-30 NOTE — Telephone Encounter (Signed)
Ok will see him tomorrow and refill

## 2019-11-30 NOTE — Telephone Encounter (Signed)
Pt needs refill on Lexapro, and wellbutrin 300mg  and 100mg  [facility]

## 2019-12-01 ENCOUNTER — Telehealth (INDEPENDENT_AMBULATORY_CARE_PROVIDER_SITE_OTHER): Payer: BLUE CROSS/BLUE SHIELD | Admitting: Psychiatry

## 2019-12-01 ENCOUNTER — Encounter (HOSPITAL_COMMUNITY): Payer: Self-pay | Admitting: Psychiatry

## 2019-12-01 DIAGNOSIS — F5102 Adjustment insomnia: Secondary | ICD-10-CM

## 2019-12-01 DIAGNOSIS — F331 Major depressive disorder, recurrent, moderate: Secondary | ICD-10-CM | POA: Diagnosis not present

## 2019-12-01 DIAGNOSIS — F411 Generalized anxiety disorder: Secondary | ICD-10-CM | POA: Diagnosis not present

## 2019-12-01 MED ORDER — ESCITALOPRAM OXALATE 20 MG PO TABS: ORAL_TABLET | ORAL | 3 refills | Status: DC

## 2019-12-01 MED ORDER — BUPROPION HCL 100 MG PO TABS: 100.0000 mg | ORAL_TABLET | Freq: Every day | ORAL | 3 refills | Status: DC

## 2019-12-01 MED ORDER — BUPROPION HCL ER (XL) 300 MG PO TB24: 300.0000 mg | ORAL_TABLET | Freq: Every day | ORAL | 3 refills | Status: DC

## 2019-12-01 NOTE — Progress Notes (Signed)
Patient ID: Rodney Bright, male   DOB: 1960/06/17, 59 y.o.   MRN: [id number]   [facility] Follow-up tele medicine appointment  Rodney Bright Dec 07, 1960  Date: 12/01/2019  HPI Comments: Rodney Bright is a 59 y/o male with a past psychiatric history significant for symptoms of depression and anxiety. The patient was referred for psychiatric services for medication management.    I connected with Rodney Bright on 12/01/19 at 10:00 AM EDT by telephone and verified that I am speaking with the correct person using two identifiers.   I discussed the limitations, risks, security and privacy concerns of performing an evaluation and management service by telephone and the availability of in person appointments. I also discussed with the patient that there may be a patient responsible charge related to this service. The patient expressed understanding and agreed to proceed.  Patient location home Provider location Home office  Last seen January 2021. On pain meds and following with providers,not on ativan    Also on adderall from primary care  Tolerating wellbutrin and lexapro, feels fair,  Pain can effect mood and sleep  Discussed various meds causing effects including addreall can lead to anxiety and sleep concerns   Building esteem but general worries related to pandemic are less   Insomnia :not worse. On cpap machine Aggravating factor: pain Modifying factor: family  Depression severity  fair Motivation: better to baseline    Review of Systems  Cardiovascular: Negative for palpitations.  Psychiatric/Behavioral: Negative for depression, hallucinations and suicidal ideas.     There were no vitals filed for this visit.  Physical Exam  Constitutional: No distress.  Obese  Skin: He is not diaphoretic.    Past Medical History: Reviewed  Past Medical History:  Diagnosis Date  . Anemia   . Diabetes mellitus    controlled  . ED (erectile dysfunction)   .  Gout   . Kidney stones    stent, lithotripsy  . Lymphadenopathy   . Morbid obesity ([place])   . OSA (obstructive sleep apnea)    CPAP-17.5 cm water pressure  . Paroxysmal atrial fibrillation (HCC)   . Pneumonia   . S/P emergency tracheotomy for assistance in breathing [facility])    at age 57  . Testosterone deficiency   . Vitamin D deficiency      Allergies:  Allergies  Allergen Reactions  . Colchicine Shortness Of Breath, Swelling and Itching  . Prunus Persica Swelling    "Peaches" " swelling of face"  . Peach Flavor     "Peaches" " swelling of face"     Current Medications: Current Outpatient Medications on File Prior to Visit  Medication Sig Dispense Refill  . acetaminophen (TYLENOL) 650 MG CR tablet Take 1 tablet (650 mg total) by mouth every 8 (eight) hours as needed for pain. 90 tablet 3  . alfuzosin (UROXATRAL) 10 MG 24 hr tablet Take 10 mg by mouth daily.  10  . allopurinol (ZYLOPRIM) 100 MG tablet Take 1 tablet (100 mg total) by mouth daily. 90 tablet 3  . AMBULATORY NON FORMULARY MEDICATION Medication Name: CPAP machine with nasal mask and supplies set to 17.5 cm water pressure.  No humidifier.  Sent to [facility].  See sleep study from 2009 attached.  Dx: OSA. 1 Units PRN.  Marland Kitchen AMBULATORY NON FORMULARY MEDICATION Medication Name: Cyclobenzaprine 2% gabapentin 3% Baclofen 2%  Diclofenac 3% Qty# 120 ml Sig: apply small amount topically to effected area 2-3 times a day as directed Fax  to [phone] 120 mL 0  . amiodarone (PACERONE) 200 MG tablet take 1 tablet(s) by mouth daily keep office visits 30 tablet 5  . amphetamine-dextroamphetamine (ADDERALL) 30 MG tablet Take one tablet by mouth twice daily 60 tablet 0  . amphetamine-dextroamphetamine (ADDERALL) 30 MG tablet Take 1 tablet by mouth 2 (two) times daily. 60 tablet 0  . aspirin EC 81 MG tablet Take 1 tablet (81 mg total) by mouth daily. 90 tablet 3  . blood glucose meter kit and supplies KIT Dispense based on patient and  insurance preference. Use up to four times daily as directed. (FOR ICD-9 250.00, 250.01). 1 each 0  . diltiazem (CARDIZEM CD) 240 MG 24 hr capsule Take 1 capsule (240 mg total) by mouth daily. 30 capsule 5  . EASY COMFORT PEN NEEDLES 31G X 8 MM MISC use once a day with victoza    . EASY PLUS II GLUCOSE TEST test strip use to test blood sugar once daily 100 each 11  . gabapentin (NEURONTIN) 300 MG capsule Take 300 mg by mouth at bedtime.    . Hylan (SYNVISC) 16 MG/2ML SOSY Inject 1 syringe into the knee weekly x3 6 mL 3  . indomethacin (INDOCIN) 50 MG capsule Take 1 capsule (50 mg total) by mouth 2 (two) times daily with a meal. 60 capsule 0  . NARCAN 4 MG/0.1ML LIQD nasal spray kit Inject solution in to one nostril for suspected overdose. Repeat in 3-5 minutes if necessary in the other nostril. CALL 911  0  . oxyCODONE (ROXICODONE) 15 MG immediate release tablet   0  . pravastatin (PRAVACHOL) 40 MG tablet Take 1 tablet (40 mg total) by mouth daily. 90 tablet 3  . Testosterone 20.25 MG/ACT (1.62%) GEL Apply 4 Pump topically daily. 150 g 1  . tiZANidine (ZANAFLEX) 4 MG tablet Take 4 mg by mouth once.     Marland Kitchen VICTOZA 18 MG/3ML SOPN Inject 0.3 mLs (1.8 mg total) into the skin daily. (54/1.8=30) 9 mL 3  . zolpidem (AMBIEN CR) 12.5 MG CR tablet Take one tablet by mouth at Bedtime 30 tablet 0   No current facility-administered medications on file prior to visit.        Social History   Socioeconomic History  . Marital status: Married    Spouse name: Not on file  . Number of children: Not on file  . Years of education: Not on file  . Highest education level: Not on file  Occupational History  . Not on file  Tobacco Use  . Smoking status: Never Smoker  . Smokeless tobacco: Never Used  Substance and Sexual Activity  . Alcohol use: No  . Drug use: No    Comment: prior cocaine abuse (heavy) in his 60s.  denies subsequent use  . Sexual activity: Yes    Partners: Female    Birth  control/protection: None    Comment: on full disability, separated, no regular exercise,walks some, drinks pot of coffee a day.  Other Topics Concern  . Not on file  Social History Narrative   Drinks one pot of coffee daily.   Regular exercise-no, walks some      Lives in [place] with roommate.   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   Social Determinants of Health   Financial Resource Strain:   . Difficulty of Paying Living Expenses: Not on file  Food Insecurity:   . Worried About Charity fundraiser in the Last  Year: Not on file  . Ran Out of Food in the Last Year: Not on file  Transportation Needs:   . Lack of Transportation (Medical): Not on file  . Lack of Transportation (Non-Medical): Not on file  Physical Activity:   . Days of Exercise per Week: Not on file  . Minutes of Exercise per Session: Not on file  Stress:   . Feeling of Stress : Not on file  Social Connections:   . Frequency of Communication with Friends and Family: Not on file  . Frequency of Social Gatherings with Friends and Family: Not on file  . Attends Religious Services: Not on file  . Active Member of Clubs or Organizations: Not on file  . Attends Archivist Meetings: Not on file  . Marital Status: Not on file    Caffeine: Caffeinated Beverages 1 5 hour energy   Family History: Reviewed  Family History  Problem Relation Age of Onset  . Cancer Mother        lung, heavy smoker  . Hypertension Mother   . Hyperlipidemia Mother   . Cancer Father        melanoma  . Aneurysm Father        cardiac  . Hyperlipidemia Father   . Hypertension Father   . Hyperlipidemia Sister   . Hypertension Sister   . Hyperlipidemia Brother   . Hypertension Brother   . Stroke Other     Psychiatric Specialty Examination: Objective: Appearance:  Eye Contact::   Speech: Clear and Coherent and Normal Rate   Volume: Normal   Mood: fair  Affect:  Thought Process: Coherent,  Linear and Logical   Orientation: Full   Thought Content: WDL   Suicidal Thoughts: No   Homicidal Thoughts: No   Judgement: Fair   Insight: Good   Psychomotor Activity:   Akathisia: Yes   Handed: Right   Memory-immediate 3/3; recent-2/3  Fund of knowledge-average to above average  Language-Intact  AIMS (if indicated): None   Assets: Communication Skills  Desire for Improvement  Financial Resources/Insurance  Physical Health    Laboratory/X-Ray  Psychological Evaluation(s)   none  Not available    Assessment:  AXIS I  Major Depression, Recurrent . Rule out ADHD , Polysustance dependence in remission. GAD. insomnia  AXIS II  No diagnosis   AXIS III  Past Medical History    Diagnosis  Date    .  Atrial fibrillation     .  Lymphadenopathy     .  Testosterone deficiency     .  Anemia     .  Vitamin d deficiency     .  ED (erectile dysfunction)     .  Morbid obesity     .  OSA (obstructive sleep apnea)       CPAP-17.5 cm water pressure    .  Kidney stones       stent, lithotripsy    .  Lymphadenopathy     .  Diabetes mellitus       controlled    .  Testosterone deficiency     .  Excess or deficiency of vitamin D     .  ED (erectile dysfunction)       Depression :fair continue wellbutrin now $RemoveBefore'400mg'XaIASLDGHqCpy$  and lexapro $RemoveBef'20mg'IsShVjCIwQ$    2. Anxiety:fluctuates but manageable, continue lexapro .  3. ADHD :not distracted gets adderall from primary care 4. Insomnia: reviewed sleep hygiene, cautioned about adderall effect on sleep  I discussed the assessment and treatment plan with the patient. The patient was provided an opportunity to ask questions and all were answered. The patient agreed with the plan and demonstrated an understanding of the instructions.   The patient was advised to call back or seek an in-person evaluation if the symptoms worsen or if the condition fails to improve as anticipated. meds refilled wellbutrin and lexapro Fu 87m.  I provided 15 minutes of non-face-to-face  time during this encounter.   Merian Capron, M.D.  12/01/2019 10:09 AM

## 2019-12-10 ENCOUNTER — Other Ambulatory Visit: Payer: Self-pay

## 2019-12-10 MED ORDER — INDOMETHACIN 50 MG PO CAPS: 50.0000 mg | ORAL_CAPSULE | Freq: Two times a day (BID) | ORAL | 0 refills | Status: DC

## 2019-12-13 ENCOUNTER — Other Ambulatory Visit: Payer: Self-pay | Admitting: Sports Medicine

## 2019-12-13 MED ORDER — INDOMETHACIN 50 MG PO CAPS
ORAL_CAPSULE | ORAL | 11 refills | Status: DC
Start: 2019-12-13 — End: 2020-12-01

## 2019-12-17 ENCOUNTER — Other Ambulatory Visit: Payer: Self-pay | Admitting: Osteopathic Medicine

## 2019-12-24 DIAGNOSIS — Z79899 Other long term (current) drug therapy: Secondary | ICD-10-CM | POA: Diagnosis not present

## 2019-12-24 DIAGNOSIS — M545 Low back pain: Secondary | ICD-10-CM | POA: Diagnosis not present

## 2019-12-24 DIAGNOSIS — G8929 Other chronic pain: Secondary | ICD-10-CM | POA: Diagnosis not present

## 2019-12-24 DIAGNOSIS — M25561 Pain in right knee: Secondary | ICD-10-CM | POA: Diagnosis not present

## 2019-12-25 ENCOUNTER — Other Ambulatory Visit: Payer: Self-pay | Admitting: Family Medicine

## 2019-12-25 DIAGNOSIS — E291 Testicular hypofunction: Secondary | ICD-10-CM

## 2019-12-27 ENCOUNTER — Other Ambulatory Visit: Payer: Self-pay | Admitting: Family Medicine

## 2019-12-27 ENCOUNTER — Telehealth (HOSPITAL_COMMUNITY): Payer: Self-pay

## 2019-12-27 NOTE — Telephone Encounter (Signed)
Dr. Linford Arnold prescribes Ambien, her office may have prescribed it today.

## 2019-12-27 NOTE — Telephone Encounter (Signed)
Could you please resend the zolpidem to [facility]. You sent it as print and it did not go thru.

## 2019-12-28 NOTE — Telephone Encounter (Signed)
Informed pt that Dr. Linford Arnold prescribes that rx and has already sent it to the pharmacy. He stated his understanding.

## 2019-12-30 ENCOUNTER — Telehealth: Payer: Self-pay | Admitting: *Deleted

## 2019-12-30 NOTE — Telephone Encounter (Signed)
Error

## 2019-12-31 ENCOUNTER — Other Ambulatory Visit: Payer: Self-pay | Admitting: Family Medicine

## 2019-12-31 ENCOUNTER — Telehealth: Payer: Self-pay

## 2019-12-31 MED ORDER — ZOLPIDEM TARTRATE ER 12.5 MG PO TBCR
12.5000 mg | EXTENDED_RELEASE_TABLET | Freq: Every day | ORAL | 0 refills | Status: DC
Start: 2019-12-31 — End: 2020-01-19

## 2019-12-31 NOTE — Telephone Encounter (Signed)
Task completed. Pt has been updated of med refill. Informed that he needs an appointment before next refill request. Pt was agreeable with plan. No other inquiries during call.

## 2019-12-31 NOTE — Telephone Encounter (Signed)
[facility] called requesting a med refill for zolpidem. Pt has been out of med for 10 days. Current rx on file from 12/27/19 was not sent to the pharmacy.

## 2019-12-31 NOTE — Telephone Encounter (Signed)
It looks like the prescription was printed instead of actually sent to the pharmacy directly.  I redid it.  But he still needs an appointment.

## 2019-12-31 NOTE — Progress Notes (Signed)
It looks like it was sent but it only printed instead of actually going to the pharmacy so I really did the prescription.  But he still needs an appointment.

## 2020-01-05 ENCOUNTER — Encounter: Payer: BLUE CROSS/BLUE SHIELD | Admitting: Vascular Surgery

## 2020-01-18 ENCOUNTER — Telehealth: Payer: Self-pay | Admitting: *Deleted

## 2020-01-18 ENCOUNTER — Other Ambulatory Visit: Payer: Self-pay | Admitting: Sports Medicine

## 2020-01-18 ENCOUNTER — Telehealth: Payer: Self-pay | Admitting: Family Medicine

## 2020-01-18 NOTE — Telephone Encounter (Signed)
Looks like Friday early afternoon at 130 is the first available.  Sorry.

## 2020-01-18 NOTE — Telephone Encounter (Signed)
Reached out to patient to schedule appt, let him know the first available appointment for a knee injection was Friday afternoon but patient stated he was going out of town on Friday morning, and was questioning if he could be worked in sooner. AM

## 2020-01-18 NOTE — Telephone Encounter (Signed)
Pt left vm wanting appointment for knee aspiration/injection.  Can you call to schedule him please?

## 2020-01-18 NOTE — Telephone Encounter (Signed)
Contacted patient and let him know the first available is Friday afternoon, and he didn't want to schedule anything right now, stated due to going out of town. AM

## 2020-01-18 NOTE — Telephone Encounter (Signed)
OPENED IN ERROR

## 2020-01-19 ENCOUNTER — Telehealth: Payer: Self-pay

## 2020-01-19 ENCOUNTER — Encounter: Payer: Self-pay | Admitting: Family Medicine

## 2020-01-19 ENCOUNTER — Ambulatory Visit (INDEPENDENT_AMBULATORY_CARE_PROVIDER_SITE_OTHER): Payer: BLUE CROSS/BLUE SHIELD | Admitting: Family Medicine

## 2020-01-19 DIAGNOSIS — G4709 Other insomnia: Secondary | ICD-10-CM | POA: Diagnosis not present

## 2020-01-19 DIAGNOSIS — F988 Other specified behavioral and emotional disorders with onset usually occurring in childhood and adolescence: Secondary | ICD-10-CM

## 2020-01-19 MED ORDER — AMPHETAMINE-DEXTROAMPHETAMINE 30 MG PO TABS
1.0000 | ORAL_TABLET | Freq: Two times a day (BID) | ORAL | 0 refills | Status: DC
Start: 2020-01-19 — End: 2020-03-14

## 2020-01-19 MED ORDER — ZOLPIDEM TARTRATE ER 12.5 MG PO TBCR
12.5000 mg | EXTENDED_RELEASE_TABLET | Freq: Every day | ORAL | 1 refills | Status: DC
Start: 2020-01-19 — End: 2020-03-14

## 2020-01-19 NOTE — Assessment & Plan Note (Signed)
He continues to do well with current dose of adderall 30mg  BID.  No symptoms related to A. Fib or worsening insomnia/anxiety.  Will continue and plan to follow up in 3 months with PCP.

## 2020-01-19 NOTE — Telephone Encounter (Signed)
1.8mg  is the max per day. If you use more than that the insurance won't cover it.  You can see if your insurace will cover Saxenda which is the higher dose of Victoza

## 2020-01-19 NOTE — Assessment & Plan Note (Signed)
Insomnia remains well controlled with ambien cr.   Discussed precautions with this along with opioid pain medication, he is aware. PDMP reviewed.  Medication renewed.

## 2020-01-19 NOTE — Progress Notes (Signed)
Rodney Bright - 59 y.o. male MRN [id number]  Date of birth: 1960/09/20  Subjective No chief complaint on file.   HPI Rodney Bright is a 59 y.o. male with history of A.fib, T2DM, ADHD and anxiety here today for follow up visit for ADHD and isomnia.  ADHD is currently managed with adderall 30mg  BID.  This continues to work well for him and he denies side effects related to this.  He has not had any episodes of A. Fib recently.  Adderall has not worsened his insomnia or anxiety.    He is also taking zolpidem at bedtime for insomnia.  This continues to work very well for him.  He has not had side effects related to this.   ROS:  A comprehensive ROS was completed and negative except as noted per HPI  Allergies  Allergen Reactions  . Colchicine Shortness Of Breath, Swelling and Itching  . Prunus Persica Swelling    "Peaches" " swelling of face"  . Peach Flavor     "Peaches" " swelling of face"    Past Medical History:  Diagnosis Date  . Anemia   . Diabetes mellitus    controlled  . ED (erectile dysfunction)   . Gout   . Kidney stones    stent, lithotripsy  . Lymphadenopathy   . Morbid obesity (HCC)   . OSA (obstructive sleep apnea)    CPAP-17.5 cm water pressure  . Paroxysmal atrial fibrillation (HCC)   . Pneumonia   . S/P emergency tracheotomy for assistance in breathing [facility])    at age 17  . Testosterone deficiency   . Vitamin D deficiency     Past Surgical History:  Procedure Laterality Date  . CHOLECYSTECTOMY    . CYSTOSCOPY     retrograde and double J catheter insertion.  9 FINGER SURGERY     ulnar digital nerve and artery right index finger  . GASTRIC BYPASS  2005   600 lbs prior to surgery  . TONSILLECTOMY      Social History   Socioeconomic History  . Marital status: Married    Spouse name: Not on file  . Number of children: Not on file  . Years of education: Not on file  . Highest education level: Not on file  Occupational History  . Not on  file  Tobacco Use  . Smoking status: Never Smoker  . Smokeless tobacco: Never Used  Substance and Sexual Activity  . Alcohol use: No  . Drug use: No    Comment: prior cocaine abuse (heavy) in his 30s.  denies subsequent use  . Sexual activity: Yes    Partners: Female    Birth control/protection: None    Comment: on full disability, separated, no regular exercise,walks some, drinks pot of coffee a day.  Other Topics Concern  . Not on file  Social History Narrative   Drinks one pot of coffee daily.   Regular exercise-no, walks some      Lives in [place] with roommate.   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   Social Determinants of Health   Financial Resource Strain:   . Difficulty of Paying Living Expenses: Not on file  Food Insecurity:   . Worried About [place]A in the Last Year: Not on file  . Ran Out of Food in the Last Year: Not on file  Transportation Needs:   . Lack of Transportation (Medical): Not on file  . Lack  of Transportation (Non-Medical): Not on file  Physical Activity:   . Days of Exercise per Week: Not on file  . Minutes of Exercise per Session: Not on file  Stress:   . Feeling of Stress : Not on file  Social Connections:   . Frequency of Communication with Friends and Family: Not on file  . Frequency of Social Gatherings with Friends and Family: Not on file  . Attends Religious Services: Not on file  . Active Member of Clubs or Organizations: Not on file  . Attends Banker Meetings: Not on file  . Marital Status: Not on file    Family History  Problem Relation Age of Onset  . Cancer Mother        lung, heavy smoker  . Hypertension Mother   . Hyperlipidemia Mother   . Cancer Father        melanoma  . Aneurysm Father        cardiac  . Hyperlipidemia Father   . Hypertension Father   . Hyperlipidemia Sister   . Hypertension Sister   . Hyperlipidemia Brother   . Hypertension Brother   .  Stroke Other     Health Maintenance  Topic Date Due  . INFLUENZA VACCINE  11/07/2019  . HEMOGLOBIN A1C  12/02/2019  . URINE MICROALBUMIN  06/03/2020  . OPHTHALMOLOGY EXAM  06/06/2020  . FOOT EXAM  07/27/2020  . COLONOSCOPY  11/28/2023  . TETANUS/TDAP  04/21/2029  . PNEUMOCOCCAL POLYSACCHARIDE VACCINE AGE 54-64 HIGH RISK  Completed  . COVID-19 Vaccine  Completed  . Hepatitis C Screening  Completed  . HIV Screening  Completed     ----------------------------------------------------------------------------------------------------------------------------------------------------------------------------------------------------------------- Physical Exam There were no vitals taken for this visit.  Physical Exam Constitutional:      Appearance: Normal appearance.  Cardiovascular:     Rate and Rhythm: Normal rate and regular rhythm.  Pulmonary:     Effort: Pulmonary effort is normal.     Breath sounds: Normal breath sounds.  Neurological:     General: No focal deficit present.     Mental Status: He is alert.  Psychiatric:        Mood and Affect: Mood normal.        Behavior: Behavior normal.     ------------------------------------------------------------------------------------------------------------------------------------------------------------------------------------------------------------------- Assessment and Plan  Attention deficit disorder (ADD) without hyperactivity He continues to do well with current dose of adderall 30mg  BID.  No symptoms related to A. Fib or worsening insomnia/anxiety.  Will continue and plan to follow up in 3 months with PCP.    Other insomnia Insomnia remains well controlled with ambien cr.   Discussed precautions with this along with opioid pain medication, he is aware. PDMP reviewed.  Medication renewed.     Meds ordered this encounter  Medications  . amphetamine-dextroamphetamine (ADDERALL) 30 MG tablet    Sig: Take 1 tablet by  mouth 2 (two) times daily.    Dispense:  60 tablet    Refill:  0  . zolpidem (AMBIEN CR) 12.5 MG CR tablet    Sig: Take 1 tablet (12.5 mg total) by mouth at bedtime.    Dispense:  30 tablet    Refill:  1    No follow-ups on file.    This visit occurred during the SARS-CoV-2 public health emergency.  Safety protocols were in place, including screening questions prior to the visit, additional usage of staff PPE, and extensive cleaning of exam room while observing appropriate contact time as indicated for disinfecting solutions.

## 2020-01-19 NOTE — Telephone Encounter (Signed)
Pt states that he has extra Victoza on hand. Concerned about his weight. Wanted to know what is the maximum safe dose he can increase the Victoza?

## 2020-01-21 ENCOUNTER — Other Ambulatory Visit: Payer: Self-pay | Admitting: Family Medicine

## 2020-01-21 DIAGNOSIS — M539 Dorsopathy, unspecified: Secondary | ICD-10-CM | POA: Diagnosis not present

## 2020-01-21 DIAGNOSIS — M545 Low back pain, unspecified: Secondary | ICD-10-CM | POA: Diagnosis not present

## 2020-01-21 DIAGNOSIS — E291 Testicular hypofunction: Secondary | ICD-10-CM

## 2020-01-21 DIAGNOSIS — G8929 Other chronic pain: Secondary | ICD-10-CM | POA: Diagnosis not present

## 2020-01-21 DIAGNOSIS — M47816 Spondylosis without myelopathy or radiculopathy, lumbar region: Secondary | ICD-10-CM | POA: Diagnosis not present

## 2020-01-21 DIAGNOSIS — R0602 Shortness of breath: Secondary | ICD-10-CM | POA: Diagnosis not present

## 2020-01-21 DIAGNOSIS — Z79899 Other long term (current) drug therapy: Secondary | ICD-10-CM | POA: Diagnosis not present

## 2020-01-24 ENCOUNTER — Telehealth: Payer: Self-pay

## 2020-01-24 NOTE — Telephone Encounter (Signed)
Rodney Bright states since he has gained weight his blood pressure has been increasing. He reports his blood pressure has been 135-145/72-85. He wanted to know if he needs to go back on blood pressure medication. Please advise.

## 2020-01-25 ENCOUNTER — Other Ambulatory Visit: Payer: Self-pay | Admitting: Family Medicine

## 2020-01-25 DIAGNOSIS — E119 Type 2 diabetes mellitus without complications: Secondary | ICD-10-CM

## 2020-01-25 MED ORDER — LISINOPRIL 10 MG PO TABS
10.0000 mg | ORAL_TABLET | Freq: Every day | ORAL | 0 refills | Status: DC
Start: 2020-01-25 — End: 2020-04-16

## 2020-01-25 NOTE — Telephone Encounter (Signed)
Yes, I agree he needs to restart blood pressure medication.  Optimally systolic pressure should be under 130.  New prescription for lisinopril 10 mg sent to pharmacy recommend he follow-up in about 2 weeks for a nurse visit for blood pressure check.  Actually, he needs a appointment with provider.

## 2020-01-25 NOTE — Telephone Encounter (Signed)
Left message advising of recommendations.  

## 2020-01-26 NOTE — Telephone Encounter (Signed)
Task completed. Pt has been updated of provider's feed back. Pt will contact his insurance to see whether Saxenda rx is covered. Pt will return a call back if he wants to start Saxenda rx.

## 2020-02-10 ENCOUNTER — Other Ambulatory Visit: Payer: Self-pay | Admitting: Family Medicine

## 2020-02-10 DIAGNOSIS — E291 Testicular hypofunction: Secondary | ICD-10-CM

## 2020-02-10 NOTE — Telephone Encounter (Signed)
Last written 10/20/2019 #150 g with 1 refill Last seen 01/19/2020

## 2020-02-11 NOTE — Telephone Encounter (Signed)
Please notify patient that I am refusing his medication. He never went for his lab work in June. This is a medication that has to be very carefully monitored and so the lab work is extremely important.

## 2020-02-14 NOTE — Telephone Encounter (Signed)
Called patient, no answer and unable to leave msg

## 2020-02-18 DIAGNOSIS — M539 Dorsopathy, unspecified: Secondary | ICD-10-CM | POA: Diagnosis not present

## 2020-02-18 DIAGNOSIS — M47816 Spondylosis without myelopathy or radiculopathy, lumbar region: Secondary | ICD-10-CM | POA: Diagnosis not present

## 2020-02-18 DIAGNOSIS — G8929 Other chronic pain: Secondary | ICD-10-CM | POA: Diagnosis not present

## 2020-02-18 DIAGNOSIS — Z79899 Other long term (current) drug therapy: Secondary | ICD-10-CM | POA: Diagnosis not present

## 2020-02-18 DIAGNOSIS — M545 Low back pain, unspecified: Secondary | ICD-10-CM | POA: Diagnosis not present

## 2020-02-22 ENCOUNTER — Other Ambulatory Visit: Payer: Self-pay | Admitting: Family Medicine

## 2020-02-22 DIAGNOSIS — E119 Type 2 diabetes mellitus without complications: Secondary | ICD-10-CM

## 2020-02-25 ENCOUNTER — Other Ambulatory Visit (HOSPITAL_COMMUNITY): Payer: Self-pay | Admitting: Psychiatry

## 2020-02-25 DIAGNOSIS — F331 Major depressive disorder, recurrent, moderate: Secondary | ICD-10-CM

## 2020-03-05 DIAGNOSIS — M47816 Spondylosis without myelopathy or radiculopathy, lumbar region: Secondary | ICD-10-CM | POA: Diagnosis not present

## 2020-03-05 DIAGNOSIS — M539 Dorsopathy, unspecified: Secondary | ICD-10-CM | POA: Diagnosis not present

## 2020-03-05 DIAGNOSIS — G8929 Other chronic pain: Secondary | ICD-10-CM | POA: Diagnosis not present

## 2020-03-05 DIAGNOSIS — Z79899 Other long term (current) drug therapy: Secondary | ICD-10-CM | POA: Diagnosis not present

## 2020-03-05 DIAGNOSIS — M545 Low back pain, unspecified: Secondary | ICD-10-CM | POA: Diagnosis not present

## 2020-03-14 ENCOUNTER — Telehealth: Payer: Self-pay | Admitting: Family Medicine

## 2020-03-14 DIAGNOSIS — G4709 Other insomnia: Secondary | ICD-10-CM

## 2020-03-14 DIAGNOSIS — Z79899 Other long term (current) drug therapy: Secondary | ICD-10-CM | POA: Diagnosis not present

## 2020-03-14 DIAGNOSIS — M47816 Spondylosis without myelopathy or radiculopathy, lumbar region: Secondary | ICD-10-CM | POA: Diagnosis not present

## 2020-03-14 DIAGNOSIS — M545 Low back pain, unspecified: Secondary | ICD-10-CM | POA: Diagnosis not present

## 2020-03-14 DIAGNOSIS — G8929 Other chronic pain: Secondary | ICD-10-CM | POA: Diagnosis not present

## 2020-03-14 DIAGNOSIS — M539 Dorsopathy, unspecified: Secondary | ICD-10-CM | POA: Diagnosis not present

## 2020-03-14 DIAGNOSIS — F988 Other specified behavioral and emotional disorders with onset usually occurring in childhood and adolescence: Secondary | ICD-10-CM

## 2020-03-14 MED ORDER — AMPHETAMINE-DEXTROAMPHETAMINE 30 MG PO TABS: 1.0000 | ORAL_TABLET | Freq: Two times a day (BID) | ORAL | 0 refills | Status: DC

## 2020-03-14 MED ORDER — ZOLPIDEM TARTRATE ER 12.5 MG PO TBCR: 12.5000 mg | EXTENDED_RELEASE_TABLET | Freq: Every day | ORAL | 0 refills | Status: DC

## 2020-03-14 NOTE — Telephone Encounter (Signed)
Patient advised meds were sent in and agreeable to keep f/u in Jan. AM

## 2020-03-14 NOTE — Telephone Encounter (Signed)
meds sent. Keep f/u in Jan

## 2020-03-14 NOTE — Telephone Encounter (Signed)
Patient stopped by, stating that he is completely out of medication listed below and stated that he is going to [facility] for a month and needs a refill on medication sent to pharmacy listed below. Patient scheduled a follow up appointment 04/18/2020 with PCP. Patient also stated he had an appointment with Dr Ashley Royalty a few months ago.    zolpidem (AMBIEN CR) 12.5 MG CR tablet   amphetamine-dextroamphetamine (ADDERALL) 30 MG tablet     [facility] Address: [address], [place], [place] [address] Phone: [phone]

## 2020-03-15 NOTE — Progress Notes (Deleted)
HPI: FU atrial fibrillation. Echocardiogram in March of 2014 showed an ejection fraction of 40-45%, mild left atrial enlargement and grade 2 diastolic dysfunction. Study was technically difficult. Nuclear study in April of 2014 at [facility] showed an ejection fraction greater than 65% and normal perfusion. Patient had an outpatient monitor for recurrent palpitations which revealed wide-complex tachycardia. The patient was seen by Dr. Rayann Heman and this was felt to be atrial flutter with one-to-one conduction. He was started on flecainide. Followup exercise treadmill showed no induced ventricular arrhythmias but there was a rate related left bundle branch block. Flecanide changed to amiodarone previously at [facility]. Patient felt not to be a candidate for ablation due to size. Hospitalized at [facility] 8/15 with GI bleed and anticoagulation DCed. Patient did have a colonoscopy which revealed pandiverticulosis. He also was transfused 8 units of packed red blood cells. Discharge summary states anticoagulation can be resumed in one week if hemoglobin stable. Patient decided to continue aspirin and avoid Coumadin.Nuclear study March 2018 showed ejection fraction 34% and ischemia in the inferior, inferolateral and apical inferior walls.At previous office visit we discussed options and elected conservative therapy. It was felt that the inferior defect could certainly be related to his size. Echocardiogram May 2018 showed normal LV function.  Repeat echocardiogram ordered at last office visit but not performed. Since I last saw him,  Current Outpatient Medications  Medication Sig Dispense Refill  . acetaminophen (TYLENOL) 650 MG CR tablet Take 1 tablet (650 mg total) by mouth every 8 (eight) hours as needed for pain. 90 tablet 3  . alfuzosin (UROXATRAL) 10 MG 24 hr tablet Take 10 mg by mouth daily.  10  . allopurinol (ZYLOPRIM) 100 MG tablet Take 1 tablet (100 mg total) by mouth daily. 90 tablet 3  .  AMBULATORY NON FORMULARY MEDICATION Medication Name: CPAP machine with nasal mask and supplies set to 17.5 cm water pressure.  No humidifier.  Sent to [facility].  See sleep study from 2009 attached.  Dx: OSA. 1 Units PRN.  Marland Kitchen AMBULATORY NON FORMULARY MEDICATION Medication Name: Cyclobenzaprine 2% gabapentin 3% Baclofen 2%  Diclofenac 3% Qty# 120 ml Sig: apply small amount topically to effected area 2-3 times a day as directed Fax to [phone] 120 mL 0  . amiodarone (PACERONE) 200 MG tablet take 1 tablet(s) by mouth daily keep office visits 30 tablet 5  . amphetamine-dextroamphetamine (ADDERALL) 30 MG tablet Take 1 tablet by mouth 2 (two) times daily. 60 tablet 0  . amphetamine-dextroamphetamine (ADDERALL) 30 MG tablet Take 1 tablet by mouth 2 (two) times daily. 60 tablet 0  . aspirin EC 81 MG tablet Take 1 tablet (81 mg total) by mouth daily. 90 tablet 3  . blood glucose meter kit and supplies KIT Dispense based on patient and insurance preference. Use up to four times daily as directed. (FOR ICD-9 250.00, 250.01). 1 each 0  . buPROPion (WELLBUTRIN XL) 300 MG 24 hr tablet Take 1 tablet (300 mg total) by mouth daily. 30 tablet 3  . buPROPion (WELLBUTRIN) 100 MG tablet Take 1 tablet (100 mg total) by mouth daily. Add to Wellbutrin $RemoveBefor'300mg'LnyXxLVAkiMd$  already taking. 30 tablet 3  . diltiazem (CARDIZEM CD) 240 MG 24 hr capsule Take 1 capsule (240 mg total) by mouth daily. 30 capsule 5  . EASY COMFORT PEN NEEDLES 31G X 8 MM MISC use once a day with victoza    . EASY PLUS II GLUCOSE TEST test strip use to test blood sugar  once daily 100 each 11  . escitalopram (LEXAPRO) 20 MG tablet take 1 tablet(s) by mouth TWO TIMES DAILY 60 tablet 3  . gabapentin (NEURONTIN) 300 MG capsule Take 300 mg by mouth at bedtime.    . Hylan (SYNVISC) 16 MG/2ML SOSY Inject 1 syringe into the knee weekly x3 6 mL 3  . indomethacin (INDOCIN) 50 MG capsule 1 capsule p.o. twice daily as needed for pain 60 capsule 11  . lisinopril (ZESTRIL) 10 MG  tablet Take 1 tablet (10 mg total) by mouth daily. 90 tablet 0  . NARCAN 4 MG/0.1ML LIQD nasal spray kit Inject solution in to one nostril for suspected overdose. Repeat in 3-5 minutes if necessary in the other nostril. CALL 911  0  . oxyCODONE (ROXICODONE) 15 MG immediate release tablet   0  . pravastatin (PRAVACHOL) 40 MG tablet Take 1 tablet (40 mg total) by mouth daily. 90 tablet 3  . Testosterone 20.25 MG/ACT (1.62%) GEL Apply 4 Pump topically daily. 150 g 1  . tiZANidine (ZANAFLEX) 4 MG tablet Take 4 mg by mouth once.     Marland Kitchen VICTOZA 18 MG/3ML SOPN Inject 0.3 mLs (1.8 mg total) into the skin daily. (54/1.8=30) 9 mL 0  . zolpidem (AMBIEN CR) 12.5 MG CR tablet Take 1 tablet (12.5 mg total) by mouth at bedtime. 30 tablet 0   No current facility-administered medications for this visit.     Past Medical History:  Diagnosis Date  . Anemia   . Diabetes mellitus    controlled  . ED (erectile dysfunction)   . Gout   . Kidney stones    stent, lithotripsy  . Lymphadenopathy   . Morbid obesity ([place])   . OSA (obstructive sleep apnea)    CPAP-17.5 cm water pressure  . Paroxysmal atrial fibrillation (HCC)   . Pneumonia   . S/P emergency tracheotomy for assistance in breathing [facility])    at age 89  . Testosterone deficiency   . Vitamin D deficiency     Past Surgical History:  Procedure Laterality Date  . CHOLECYSTECTOMY    . CYSTOSCOPY     retrograde and double J catheter insertion.  Marland Kitchen FINGER SURGERY     ulnar digital nerve and artery right index finger  . GASTRIC BYPASS  2005   600 lbs prior to surgery  . TONSILLECTOMY      Social History   Socioeconomic History  . Marital status: Married    Spouse name: Not on file  . Number of children: Not on file  . Years of education: Not on file  . Highest education level: Not on file  Occupational History  . Not on file  Tobacco Use  . Smoking status: Never Smoker  . Smokeless tobacco: Never Used  Substance and Sexual Activity  .  Alcohol use: No  . Drug use: No    Comment: prior cocaine abuse (heavy) in his 45s.  denies subsequent use  . Sexual activity: Yes    Partners: Female    Birth control/protection: None    Comment: on full disability, separated, no regular exercise,walks some, drinks pot of coffee a day.  Other Topics Concern  . Not on file  Social History Narrative   Drinks one pot of coffee daily.   Regular exercise-no, walks some      Lives in [place] with roommate.   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   Social Determinants of Radio broadcast assistant  Strain:   . Difficulty of Paying Living Expenses: Not on file  Food Insecurity:   . Worried About Charity fundraiser in the Last Year: Not on file  . Ran Out of Food in the Last Year: Not on file  Transportation Needs:   . Lack of Transportation (Medical): Not on file  . Lack of Transportation (Non-Medical): Not on file  Physical Activity:   . Days of Exercise per Week: Not on file  . Minutes of Exercise per Session: Not on file  Stress:   . Feeling of Stress : Not on file  Social Connections:   . Frequency of Communication with Friends and Family: Not on file  . Frequency of Social Gatherings with Friends and Family: Not on file  . Attends Religious Services: Not on file  . Active Member of Clubs or Organizations: Not on file  . Attends Archivist Meetings: Not on file  . Marital Status: Not on file  Intimate Partner Violence:   . Fear of Current or Ex-Partner: Not on file  . Emotionally Abused: Not on file  . Physically Abused: Not on file  . Sexually Abused: Not on file    Family History  Problem Relation Age of Onset  . Cancer Mother        lung, heavy smoker  . Hypertension Mother   . Hyperlipidemia Mother   . Cancer Father        melanoma  . Aneurysm Father        cardiac  . Hyperlipidemia Father   . Hypertension Father   . Hyperlipidemia Sister   . Hypertension Sister   .  Hyperlipidemia Brother   . Hypertension Brother   . Stroke Other     ROS: no fevers or chills, productive cough, hemoptysis, dysphasia, odynophagia, melena, hematochezia, dysuria, hematuria, rash, seizure activity, orthopnea, PND, pedal edema, claudication. Remaining systems are negative.  Physical Exam: Well-developed well-nourished in no acute distress.  Skin is warm and dry.  HEENT is normal.  Neck is supple.  Chest is clear to auscultation with normal expansion.  Cardiovascular exam is regular rate and rhythm.  Abdominal exam nontender or distended. No masses palpated. Extremities show no edema. neuro grossly intact  ECG- personally reviewed  A/P  1 paroxysmal atrial fibrillation-patient remains in sinus rhythm.  Continue amiodarone.  Check TSH, liver functions and chest x-ray.  Patient again declines anticoagulation and understands the higher risk of CVA.  He is concerned because of previous life-threatening bleed on anticoagulation.  Repeat echocardiogram.  2 hypertension-blood pressure controlled.  Continue present medical regimen and follow.\  3 morbid obesity-we discussed the importance of weight loss today.  Kirk Ruths, MD

## 2020-03-16 ENCOUNTER — Other Ambulatory Visit: Payer: Self-pay | Admitting: Cardiology

## 2020-03-20 ENCOUNTER — Other Ambulatory Visit: Payer: Self-pay | Admitting: Family Medicine

## 2020-03-20 DIAGNOSIS — E119 Type 2 diabetes mellitus without complications: Secondary | ICD-10-CM

## 2020-03-22 ENCOUNTER — Ambulatory Visit: Payer: BLUE CROSS/BLUE SHIELD | Admitting: Cardiology

## 2020-04-15 ENCOUNTER — Other Ambulatory Visit: Payer: Self-pay | Admitting: Family Medicine

## 2020-04-17 ENCOUNTER — Other Ambulatory Visit: Payer: Self-pay

## 2020-04-17 DIAGNOSIS — G4709 Other insomnia: Secondary | ICD-10-CM

## 2020-04-17 MED ORDER — ZOLPIDEM TARTRATE ER 12.5 MG PO TBCR: 12.5000 mg | EXTENDED_RELEASE_TABLET | Freq: Every day | ORAL | 0 refills | Status: DC

## 2020-04-17 NOTE — Telephone Encounter (Signed)
Prescription sent to pharmacy.  But please let him know he needs to schedule a follow-up

## 2020-04-18 ENCOUNTER — Ambulatory Visit: Payer: BLUE CROSS/BLUE SHIELD | Admitting: Family Medicine

## 2020-04-18 NOTE — Telephone Encounter (Signed)
Please call Rodney Bright and inform him that he is OVERDUE for a follow up with Dr. Linford Arnold for his medications and labs. It is imperative that he schedule a f/u appt and be prepared to have fasting labs done.   This will be needed for him to obtain refills.

## 2020-04-26 NOTE — Telephone Encounter (Signed)
Patient has appt for Monday 05/01/20. AM

## 2020-05-01 ENCOUNTER — Ambulatory Visit: Payer: Self-pay | Admitting: Family Medicine

## 2020-05-03 NOTE — Telephone Encounter (Signed)
Please call pt to reschedule his missed appt on 1/24.

## 2020-05-04 NOTE — Telephone Encounter (Signed)
Last time I spoke to patient he said he wouldn't be able to come in due to his insurance not being accepted here or being out of network. LVM

## 2020-05-11 ENCOUNTER — Telehealth: Payer: Self-pay | Admitting: Family Medicine

## 2020-05-11 ENCOUNTER — Other Ambulatory Visit: Payer: Self-pay

## 2020-05-11 ENCOUNTER — Ambulatory Visit: Payer: BLUE CROSS/BLUE SHIELD | Admitting: Sports Medicine

## 2020-05-11 NOTE — Telephone Encounter (Signed)
Patient aware of information below, stated he is currently putting ice on the knee a few times a day for 20 mins. Also stated he has been taken more Oxycodone than normal due to the pain in the knee being so bad. Stated that he did let his pain clinic know this as well and also wanted to let you know. If you needed to speak with him, he said you could call him, I let patient know I would pass this information along. AM

## 2020-05-11 NOTE — Telephone Encounter (Signed)
Patient did want something called in for pain, stated oxycodone ? Is what he takes for pain    [phone] [place], [place] - [address] Phone:  [phone]  Fax:  [phone]      Scheduled on Monday with you.

## 2020-05-11 NOTE — Telephone Encounter (Signed)
Looks like he had about 138 oxycodone filled a few weeks ago by his pain doc, I think we will probably leave this alone and not add any more pain medication, he should ice the knee 20 minutes 3-4 times a day for now.

## 2020-05-15 ENCOUNTER — Other Ambulatory Visit: Payer: Self-pay | Admitting: Family Medicine

## 2020-05-15 ENCOUNTER — Telehealth: Payer: Self-pay | Admitting: Family Medicine

## 2020-05-15 ENCOUNTER — Telehealth (INDEPENDENT_AMBULATORY_CARE_PROVIDER_SITE_OTHER): Payer: BLUE CROSS/BLUE SHIELD | Admitting: Sports Medicine

## 2020-05-15 DIAGNOSIS — G4709 Other insomnia: Secondary | ICD-10-CM

## 2020-05-15 DIAGNOSIS — M1712 Unilateral primary osteoarthritis, left knee: Secondary | ICD-10-CM

## 2020-05-15 MED ORDER — OXYCODONE HCL 15 MG PO TABS: 15.0000 mg | ORAL_TABLET | ORAL | 0 refills | Status: AC | PRN

## 2020-05-15 NOTE — Progress Notes (Signed)
   Virtual Visit via Telephone   I connected with  [name]  on 05/15/20 by telephone/telehealth and verified that I am speaking with the correct person using two identifiers.   I discussed the limitations, risks, security and privacy concerns of performing an evaluation and management service by telephone, including the higher likelihood of inaccurate diagnosis and treatment, and the availability of in person appointments.  We also discussed the likely need of an additional face to face encounter for complete and high quality delivery of care.  I also discussed with the patient that there may be a patient responsible charge related to this service. The patient expressed understanding and wishes to proceed.  Provider location is in medical facility. Patient location is at their home, different from provider location. People involved in care of the patient during this telehealth encounter were myself, my nurse/medical assistant, and my front office/scheduling team member.  Review of Systems: No fevers, chills, night sweats, weight loss, chest pain, or shortness of breath.   Objective Findings:    General: Speaking full sentences, no audible heavy breathing.  Sounds alert and appropriately interactive.    Independent interpretation of tests performed by another provider:   None.  Brief History, Exam, Impression, and Recommendations:    Primary osteoarthritis and CPPD of left knee Rodney Bright is a very pleasant 60 year old male with left knee osteoarthritis, he has done a full series of Synvisc back in July.  Overall he was doing well, he did have some increasing swelling and we had planned to drain his knee last week but he did have a potential Covid contact. He was rescheduled to a virtual visit today. He does have a contract with his pain management provider, due to his pain and the delay in our procedure he did need to use a few more of his narcotics, his pain provider will be made aware,  and I am happy to call in a few more, he will need 12 more pills to get him through to tomorrow. He will be greater than 5 days from any potential Covid contacts, he is fully vaccinated, and I think he is safe to come to the office for drainage tomorrow.    I discussed the above assessment and treatment plan with the patient. The patient was provided an opportunity to ask questions and all were answered. The patient agreed with the plan and demonstrated an understanding of the instructions.   The patient was advised to call back or seek an in-person evaluation if the symptoms worsen or if the condition fails to improve as anticipated.   I provided 30 minutes of face to face and non-face-to-face time during this encounter date, time was needed to gather information, review chart, records, communicate/coordinate with staff remotely, as well as complete documentation.   ___________________________________________ Rodney Bright. Rodney Bright, M.D., ABFM., CAQSM. Primary Care and Sports Medicine [facility]  Adjunct Professor of Family Medicine  [employer]

## 2020-05-15 NOTE — Assessment & Plan Note (Signed)
Rodney Bright is a very pleasant 60 year old male with left knee osteoarthritis, he has done a full series of Synvisc back in July.  Overall he was doing well, he did have some increasing swelling and we had planned to drain his knee last week but he did have a potential Covid contact. He was rescheduled to a virtual visit today. He does have a contract with his pain management provider, due to his pain and the delay in our procedure he did need to use a few more of his narcotics, his pain provider will be made aware, and I am happy to call in a few more, he will need 12 more pills to get him through to tomorrow. He will be greater than 5 days from any potential Covid contacts, he is fully vaccinated, and I think he is safe to come to the office for drainage tomorrow.

## 2020-05-15 NOTE — Telephone Encounter (Signed)
error 

## 2020-05-16 ENCOUNTER — Ambulatory Visit (INDEPENDENT_AMBULATORY_CARE_PROVIDER_SITE_OTHER): Payer: BLUE CROSS/BLUE SHIELD

## 2020-05-16 ENCOUNTER — Ambulatory Visit: Payer: Self-pay

## 2020-05-16 ENCOUNTER — Other Ambulatory Visit: Payer: Self-pay

## 2020-05-16 ENCOUNTER — Ambulatory Visit (INDEPENDENT_AMBULATORY_CARE_PROVIDER_SITE_OTHER): Payer: BLUE CROSS/BLUE SHIELD | Admitting: Sports Medicine

## 2020-05-16 DIAGNOSIS — M1712 Unilateral primary osteoarthritis, left knee: Secondary | ICD-10-CM

## 2020-05-16 NOTE — Progress Notes (Signed)
    Procedures performed today:    Procedure: Real-time Ultrasound Guided aspiration/injection of right knee Device: Samsung HS60  Verbal informed consent obtained.  Time-out conducted.  Noted no overlying erythema, induration, or other signs of local infection.  Skin prepped in a sterile fashion.  Local anesthesia: Topical Ethyl chloride.  With sterile technique and under real time ultrasound guidance:  Noted effusion, using 18-gauge needle aspirated approximately 25 mL of clear, straw-colored fluid, syringe switched and 1 cc Kenalog 40, 2 cc lidocaine, 2 cc bupivacaine injected easily, syringe again switched and an entire syringe of Synvisc injected. Completed without difficulty  Advised to call if fevers/chills, erythema, induration, drainage, or persistent bleeding.  Images permanently stored and available for review in PACS.  Impression: Technically successful ultrasound guided injection.  Independent interpretation of notes and tests performed by another provider:   None.  Brief History, Exam, Impression, and Recommendations:    Primary osteoarthritis and CPPD of left knee Rodney Bright is here for consideration of right knee aspiration and injection with steroid and Synvisc No. 1 of 3. Repeat aspiration and injection, he did a full series of Synvisc in the left knee back in July. Return to see me in 1 week for Synvisc injection #2 of 3 right knee    ___________________________________________ Rodney Bright. Benjamin Stain, M.D., ABFM., CAQSM. Primary Care and Sports Medicine [facility]  Adjunct Instructor of Family Medicine  [employer]

## 2020-05-16 NOTE — Assessment & Plan Note (Addendum)
Rodney Bright is here for consideration of right knee aspiration and injection with steroid and Synvisc No. 1 of 3. Repeat aspiration and injection, he did a full series of Synvisc in the left knee back in July. Return to see me in 1 week for Synvisc injection #2 of 3 right knee

## 2020-05-19 ENCOUNTER — Other Ambulatory Visit: Payer: Self-pay | Admitting: Family Medicine

## 2020-05-19 DIAGNOSIS — E119 Type 2 diabetes mellitus without complications: Secondary | ICD-10-CM

## 2020-05-23 ENCOUNTER — Ambulatory Visit (INDEPENDENT_AMBULATORY_CARE_PROVIDER_SITE_OTHER): Payer: BLUE CROSS/BLUE SHIELD

## 2020-05-23 ENCOUNTER — Ambulatory Visit (INDEPENDENT_AMBULATORY_CARE_PROVIDER_SITE_OTHER): Payer: BLUE CROSS/BLUE SHIELD | Admitting: Sports Medicine

## 2020-05-23 ENCOUNTER — Encounter: Payer: Self-pay | Admitting: Sports Medicine

## 2020-05-23 ENCOUNTER — Other Ambulatory Visit: Payer: Self-pay

## 2020-05-23 DIAGNOSIS — M170 Bilateral primary osteoarthritis of knee: Secondary | ICD-10-CM

## 2020-05-23 DIAGNOSIS — M1712 Unilateral primary osteoarthritis, left knee: Secondary | ICD-10-CM

## 2020-05-23 NOTE — Progress Notes (Signed)
    Procedures performed today:    Procedure: Real-time Ultrasound Guided aspiration/injection of right knee Device: Samsung HS60  Verbal informed consent obtained.  Time-out conducted.  Noted no overlying erythema, induration, or other signs of local infection.  Skin prepped in a sterile fashion.  Local anesthesia: Topical Ethyl chloride.  With sterile technique and under real time ultrasound guidance: Noted effusion, using 18-gauge needle aspirated approximately 20 mL of clear, straw-colored fluid, syringe switched an entire syringe of Synvisc injected. Completed without difficulty  Advised to call if fevers/chills, erythema, induration, drainage, or persistent bleeding.  Images permanently stored and available for review in PACS.  Impression: Technically successful ultrasound guided injection.  Independent interpretation of notes and tests performed by another provider:   None.  Brief History, Exam, Impression, and Recommendations:    Primary osteoarthritis and CPPD of both knees Left knee continues to do well after Synvisc series back in July. We did steroid and Synvisc No. 1 of 3 at the last visit, injection #2 of 3 today. Return in 1 week for injection #3 of 3.    ___________________________________________ Ihor Austin. Benjamin Stain, M.D., ABFM., CAQSM. Primary Care and Sports Medicine [facility]  Adjunct Instructor of Family Medicine  [employer]

## 2020-05-23 NOTE — Assessment & Plan Note (Signed)
Left knee continues to do well after Synvisc series back in July. We did steroid and Synvisc No. 1 of 3 at the last visit, injection #2 of 3 today. Return in 1 week for injection #3 of 3.

## 2020-05-30 ENCOUNTER — Ambulatory Visit (INDEPENDENT_AMBULATORY_CARE_PROVIDER_SITE_OTHER): Payer: BLUE CROSS/BLUE SHIELD | Admitting: Sports Medicine

## 2020-05-30 ENCOUNTER — Ambulatory Visit (INDEPENDENT_AMBULATORY_CARE_PROVIDER_SITE_OTHER): Payer: BLUE CROSS/BLUE SHIELD

## 2020-05-30 DIAGNOSIS — M170 Bilateral primary osteoarthritis of knee: Secondary | ICD-10-CM

## 2020-05-30 NOTE — Progress Notes (Signed)
    Procedures performed today:    None.  Independent interpretation of notes and tests performed by another provider:   Procedure: Real-time Ultrasound Guided injection of the right knee Device: Samsung HS60  Verbal informed consent obtained.  Time-out conducted.  Noted no overlying erythema, induration, or other signs of local infection.  Skin prepped in a sterile fashion.  Local anesthesia: Topical Ethyl chloride.  With sterile technique and under real time ultrasound guidance:  Noted mild effusion, 1 syringe of Synvisc injected through a 22-gauge needle into the suprapatellar recess.   Completed without difficulty  Advised to call if fevers/chills, erythema, induration, drainage, or persistent bleeding.  Images permanently stored and available for review in PACS.  Impression: Technically successful ultrasound guided injection.  Brief History, Exam, Impression, and Recommendations:    Primary osteoarthritis and CPPD of both knees Left knee continues to do well after Synvisc series back in July, today we did right knee Synvisc injection #3 of 3, return as needed. Added a reaction knee brace per patient request.    ___________________________________________ Ihor Austin. Benjamin Stain, M.D., ABFM., CAQSM. Primary Care and Sports Medicine  [facility]  Adjunct Instructor of Family Medicine  [employer]

## 2020-05-30 NOTE — Assessment & Plan Note (Addendum)
Left knee continues to do well after Synvisc series back in July, today we did right knee Synvisc injection #3 of 3, return as needed. Added a reaction knee brace per patient request.

## 2020-06-16 ENCOUNTER — Other Ambulatory Visit: Payer: Self-pay | Admitting: Family Medicine

## 2020-06-16 NOTE — Telephone Encounter (Signed)
Called patient to schedule a f/u appt. there was no answer, LVM and will continue to call. tvt

## 2020-06-16 NOTE — Telephone Encounter (Signed)
Call pt and have him schedule f/u appointment with fasting labs.

## 2020-06-19 NOTE — Telephone Encounter (Signed)
Callled pt again and left vm to call and schedule for f/u appt with fasting labs

## 2020-07-10 ENCOUNTER — Other Ambulatory Visit: Payer: Self-pay | Admitting: Family Medicine

## 2020-07-11 ENCOUNTER — Other Ambulatory Visit (HOSPITAL_COMMUNITY): Payer: Self-pay | Admitting: Psychiatry

## 2020-07-11 DIAGNOSIS — F331 Major depressive disorder, recurrent, moderate: Secondary | ICD-10-CM

## 2020-07-17 ENCOUNTER — Telehealth (HOSPITAL_COMMUNITY): Payer: BLUE CROSS/BLUE SHIELD | Admitting: Psychiatry

## 2020-09-08 ENCOUNTER — Other Ambulatory Visit (HOSPITAL_COMMUNITY): Payer: Self-pay | Admitting: Psychiatry

## 2020-09-08 ENCOUNTER — Other Ambulatory Visit: Payer: Self-pay | Admitting: Cardiology

## 2020-09-08 DIAGNOSIS — F331 Major depressive disorder, recurrent, moderate: Secondary | ICD-10-CM

## 2020-10-10 ENCOUNTER — Other Ambulatory Visit: Payer: Self-pay | Admitting: Cardiology

## 2020-10-11 ENCOUNTER — Telehealth (INDEPENDENT_AMBULATORY_CARE_PROVIDER_SITE_OTHER): Payer: 59 | Admitting: Psychiatry

## 2020-10-11 ENCOUNTER — Telehealth (HOSPITAL_COMMUNITY): Payer: Self-pay

## 2020-10-11 ENCOUNTER — Encounter (HOSPITAL_COMMUNITY): Payer: Self-pay | Admitting: Psychiatry

## 2020-10-11 DIAGNOSIS — F411 Generalized anxiety disorder: Secondary | ICD-10-CM

## 2020-10-11 DIAGNOSIS — F331 Major depressive disorder, recurrent, moderate: Secondary | ICD-10-CM

## 2020-10-11 DIAGNOSIS — R69 Illness, unspecified: Secondary | ICD-10-CM | POA: Diagnosis not present

## 2020-10-11 MED ORDER — BUPROPION HCL ER (XL) 450 MG PO TB24: 450.0000 mg | ORAL_TABLET | Freq: Every day | ORAL | 0 refills | Status: DC

## 2020-10-11 MED ORDER — ESCITALOPRAM OXALATE 20 MG PO TABS: ORAL_TABLET | ORAL | 1 refills | Status: DC

## 2020-10-11 NOTE — Progress Notes (Signed)
Patient ID: RAYVION STUMPH, male   DOB: 01-10-1961, 60 y.o.   MRN: [id number]   [place] Follow-up tele medicine appointment  DAREL RICKETTS 08-05-1960  Date 10/11/20  HPI Comments: Mr. Mccravy is a 60y/o male with a past psychiatric history significant for symptoms of depression and anxiety. The patient was referred for psychiatric services for medication management.  Virtual Visit via Telephone Note  I connected with [name] on 10/11/20 at  1:15 PM EDT by telephone and verified that I am speaking with the correct person using two identifiers.  Location: Patient: home Provider: home office   I discussed the limitations, risks, security and privacy concerns of performing an evaluation and management service by telephone and the availability of in person appointments. I also discussed with the patient that there may be a patient responsible charge related to this service. The patient expressed understanding and agreed to proceed.     I discussed the assessment and treatment plan with the patient. The patient was provided an opportunity to ask questions and all were answered. The patient agreed with the plan and demonstrated an understanding of the instructions.   The patient was advised to call back or seek an in-person evaluation if the symptoms worsen or if the condition fails to improve as anticipated.  I provided 15  minutes of non-face-to-face time during this encounter.   Patient mood wise is doing reasonable currently on Wellbutrin and Lexapro He follows up with Dr. Madilyn Fireman for ADHD medication also has sleep concerns get Ambien from Dr. Madilyn Fireman On pain meds and following with providers,not on ativan    Pain can effect mood and sleep In general content with the medication has been on Ativan that was discontinued considering he is on pain medications Uses a CPAP machine still has issues without Ambien we talked about probably cutting down the Adderall  since he is taking 2 times a day and that will indirectly affect will help her sleep   Discussed various meds causing effects including addreall can lead to anxiety and sleep concerns   Insomnia :On cpap machine Aggravating factor: Pain  modifying factor: family  Depression severity  fair Motivation: Fair    Review of Systems  Psychiatric/Behavioral:  Negative for depression, hallucinations and suicidal ideas.     There were no vitals filed for this visit.  Physical Exam  Constitutional: No distress.  Obese  Skin: He is not diaphoretic.    Past Medical History: Reviewed  Past Medical History:  Diagnosis Date   Anemia    Diabetes mellitus    controlled   ED (erectile dysfunction)    Gout    Kidney stones    stent, lithotripsy   Lymphadenopathy    Morbid obesity (HCC)    OSA (obstructive sleep apnea)    CPAP-17.5 cm water pressure   Paroxysmal atrial fibrillation (HCC)    Pneumonia    S/P emergency tracheotomy for assistance in breathing ([place])    at age 35   Testosterone deficiency    Vitamin D deficiency      Allergies:  Allergies  Allergen Reactions   Colchicine Shortness Of Breath, Swelling and Itching   Prunus Persica Swelling    "Peaches" " swelling of face"   Peach Flavor     "Peaches" " swelling of face"     Current Medications: Current Outpatient Medications on File Prior to Visit  Medication Sig Dispense Refill   acetaminophen (TYLENOL) 650 MG CR tablet Take 1  tablet (650 mg total) by mouth every 8 (eight) hours as needed for pain. 90 tablet 3   alfuzosin (UROXATRAL) 10 MG 24 hr tablet Take 10 mg by mouth daily.  10   allopurinol (ZYLOPRIM) 100 MG tablet Take 1 tablet (100 mg total) by mouth daily. 90 tablet 3   AMBULATORY NON FORMULARY MEDICATION Medication Name: CPAP machine with nasal mask and supplies set to 17.5 cm water pressure.  No humidifier.  Sent to [facility].  See sleep study from 2009 attached.  Dx: OSA. 1 Units PRN.   AMBULATORY  NON FORMULARY MEDICATION Medication Name: Cyclobenzaprine 2% gabapentin 3% Baclofen 2%  Diclofenac 3% Qty# 120 ml Sig: apply small amount topically to effected area 2-3 times a day as directed Fax to [phone] 120 mL 0   amiodarone (PACERONE) 200 MG tablet take 1 tablet(s) by mouth daily keep office visits 30 tablet 0   amphetamine-dextroamphetamine (ADDERALL) 30 MG tablet Take 1 tablet by mouth 2 (two) times daily. 60 tablet 0   amphetamine-dextroamphetamine (ADDERALL) 30 MG tablet Take 1 tablet by mouth 2 (two) times daily. 60 tablet 0   aspirin EC 81 MG tablet Take 1 tablet (81 mg total) by mouth daily. 90 tablet 3   blood glucose meter kit and supplies KIT Dispense based on patient and insurance preference. Use up to four times daily as directed. (FOR ICD-9 250.00, 250.01). 1 each 0   diltiazem (CARDIZEM CD) 240 MG 24 hr capsule Take 1 capsule (240 mg total) by mouth daily. 30 capsule 5   EASY COMFORT PEN NEEDLES 31G X 8 MM MISC use once a day with victoza     EASY PLUS II GLUCOSE TEST test strip use to test blood sugar once daily 100 each 11   gabapentin (NEURONTIN) 300 MG capsule Take 300 mg by mouth at bedtime.     Hylan (SYNVISC) 16 MG/2ML SOSY Inject 1 syringe into the knee weekly x3 6 mL 3   indomethacin (INDOCIN) 50 MG capsule 1 capsule p.o. twice daily as needed for pain 60 capsule 11   lisinopril (ZESTRIL) 10 MG tablet Take 1 tablet (10 mg total) by mouth daily. Labs for further refills 15 tablet 0   NARCAN 4 MG/0.1ML LIQD nasal spray kit Inject solution in to one nostril for suspected overdose. Repeat in 3-5 minutes if necessary in the other nostril. CALL 911  0   oxyCODONE (ROXICODONE) 15 MG immediate release tablet Take 1 tablet (15 mg total) by mouth every 4 (four) hours as needed for pain. 12 tablet 0   pravastatin (PRAVACHOL) 40 MG tablet Take one tablet by mouth daily 30 tablet 0   Testosterone 20.25 MG/ACT (1.62%) GEL Apply 4 Pump topically daily. 150 g 1   tiZANidine  (ZANAFLEX) 4 MG tablet Take 4 mg by mouth once.      VICTOZA 18 MG/3ML SOPN Inject 0.3 mLs (1.8 mg total) into the skin daily. (54/1.8=30) 9 mL 0   zolpidem (AMBIEN CR) 12.5 MG CR tablet Take 1 tablet (12.5 mg total) by mouth at bedtime. 30 tablet 0   No current facility-administered medications on file prior to visit.        Social History   Socioeconomic History   Marital status: Married    Spouse name: Not on file   Number of children: Not on file   Years of education: Not on file   Highest education level: Not on file  Occupational History   Not on file  Tobacco Use  Smoking status: Never   Smokeless tobacco: Never  Substance and Sexual Activity   Alcohol use: No   Drug use: No    Comment: prior cocaine abuse (heavy) in his 26s.  denies subsequent use   Sexual activity: Yes    Partners: Female    Birth control/protection: None    Comment: on full disability, separated, no regular exercise,walks some, drinks pot of coffee a day.  Other Topics Concern   Not on file  Social History Narrative   Drinks one pot of coffee daily.   Regular exercise-no, walks some      Lives in [place] with roommate.   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   Social Determinants of Health   Financial Resource Strain: Not on file  Food Insecurity: Not on file  Transportation Needs: Not on file  Physical Activity: Not on file  Stress: Not on file  Social Connections: Not on file    Caffeine: Caffeinated Beverages 1 5 hour energy   Family History: Reviewed  Family History  Problem Relation Age of Onset   Cancer Mother        lung, heavy smoker   Hypertension Mother    Hyperlipidemia Mother    Cancer Father        melanoma   Aneurysm Father        cardiac   Hyperlipidemia Father    Hypertension Father    Hyperlipidemia Sister    Hypertension Sister    Hyperlipidemia Brother    Hypertension Brother    Stroke Other     Psychiatric Specialty  Examination: Objective: Appearance:  Engineer, water::   Speech: Clear and Coherent and Normal Rate   Volume: Normal   Mood: fair  Affect:  Thought Process: Coherent, Linear and Logical   Orientation: Full   Thought Content: WDL   Suicidal Thoughts: No   Homicidal Thoughts: No   Judgement: Fair   Insight: Good   Psychomotor Activity:   Akathisia: Yes   Handed: Right   Memory-immediate 3/3; recent-2/3  Fund of knowledge-average to above average  Language-Intact  AIMS (if indicated): None   Assets: Communication Skills  Desire for Improvement  Financial Resources/Insurance  Physical Health    Laboratory/X-Ray  Psychological Evaluation(s)   none  Not available    Assessment:  AXIS I  Major Depression, Recurrent . Rule out ADHD , Polysustance dependence in remission. GAD. insomnia  AXIS II  No diagnosis   AXIS III  Past Medical History    Diagnosis  Date      Atrial fibrillation       Lymphadenopathy       Testosterone deficiency       Anemia       Vitamin d deficiency       ED (erectile dysfunction)       Morbid obesity       OSA (obstructive sleep apnea)       CPAP-17.5 cm water pressure      Kidney stones       stent, lithotripsy      Lymphadenopathy       Diabetes mellitus       controlled      Testosterone deficiency       Excess or deficiency of vitamin D       ED (erectile dysfunction)       Prior documentation reviewed   Depression : Fair continue Wellbutrin he wants to be on 1  tablet so we will discontinue the 300 and 100 mg tablet changed to 1 tablet of 450 mg XL 2. Anxiety: Fluctuates continue Lexapro consider therapy .  3. ADHD :not distracted gets adderall from primary care Discussed possibility lower the dose so that he can sleep better as Adderall can cause insomnia  4. Insomnia: reviewed sleep hygiene, cautioned about adderall effect on sleep   Continue using CPAP machine he does get Ambien at times from his primary care physician  Follow-up  in 6 months or earlier if needed  Merian Capron, M.D.  10/11/2020 1:47 PM

## 2020-10-11 NOTE — Telephone Encounter (Signed)
[facility] pharmacy says that they can not fill the bupropion 450mg . Want to know if you can send in 300mg  and a 150mg . Please advise

## 2020-10-12 MED ORDER — BUPROPION HCL ER (XL) 300 MG PO TB24: 300.0000 mg | ORAL_TABLET | Freq: Every day | ORAL | 1 refills | Status: DC

## 2020-10-12 MED ORDER — BUPROPION HCL ER (XL) 150 MG PO TB24: 150.0000 mg | ORAL_TABLET | Freq: Every day | ORAL | 1 refills | Status: DC

## 2020-10-12 NOTE — Telephone Encounter (Signed)
Left vm informing pt of the bupropion being two different dosages

## 2020-10-13 ENCOUNTER — Ambulatory Visit: Payer: BLUE CROSS/BLUE SHIELD | Admitting: Sports Medicine

## 2020-10-18 NOTE — Progress Notes (Signed)
HPI: FU atrial fibrillation. Echocardiogram in March of 2014 showed an ejection fraction of 40-45%, mild left atrial enlargement and grade 2 diastolic dysfunction. Study was technically difficult. Nuclear study in April of 2014 at [facility] showed an ejection fraction greater than 65% and normal perfusion. Patient had an outpatient monitor for recurrent palpitations which revealed wide-complex tachycardia. The patient was seen by Dr. Rayann Heman and this was felt to be atrial flutter with one-to-one conduction. He was started on flecainide. Followup exercise treadmill showed no induced ventricular arrhythmias but there was a rate related left bundle branch block. Flecanide changed to amiodarone previously at [facility]. Patient felt not to be a candidate for ablation due to size. Hospitalized at [facility] 8/15 with GI bleed and anticoagulation DCed. Patient did have a colonoscopy which revealed pandiverticulosis. He also was transfused 8 units of packed red blood cells. Discharge summary states anticoagulation can be resumed in one week if hemoglobin stable. Patient decided to continue aspirin and avoid Coumadin. Nuclear study March 2018 showed ejection fraction 34% and ischemia in the inferior, inferolateral and apical inferior walls.   At previous office visit we discussed options and elected conservative therapy.  It was felt that the inferior defect could certainly be related to his size.  Echocardiogram May 2018 showed normal LV function.  Since I last saw him, the patient denies any dyspnea on exertion, orthopnea, PND, pedal edema, palpitations, syncope or chest pain.   Current Outpatient Medications  Medication Sig Dispense Refill   acetaminophen (TYLENOL) 650 MG CR tablet Take 1 tablet (650 mg total) by mouth every 8 (eight) hours as needed for pain. 90 tablet 3   alfuzosin (UROXATRAL) 10 MG 24 hr tablet Take 10 mg by mouth daily.  10   allopurinol (ZYLOPRIM) 100 MG tablet Take 1 tablet (100 mg  total) by mouth daily. 90 tablet 3   AMBULATORY NON FORMULARY MEDICATION Medication Name: CPAP machine with nasal mask and supplies set to 17.5 cm water pressure.  No humidifier.  Sent to [facility].  See sleep study from 2009 attached.  Dx: OSA. 1 Units PRN.   AMBULATORY NON FORMULARY MEDICATION Medication Name: Cyclobenzaprine 2% gabapentin 3% Baclofen 2%  Diclofenac 3% Qty# 120 ml Sig: apply small amount topically to effected area 2-3 times a day as directed Fax to [phone] 120 mL 0   amiodarone (PACERONE) 200 MG tablet take 1 tablet(s) by mouth daily keep office visits 30 tablet 0   amphetamine-dextroamphetamine (ADDERALL) 30 MG tablet Take 1 tablet by mouth 2 (two) times daily. 60 tablet 0   amphetamine-dextroamphetamine (ADDERALL) 30 MG tablet Take 1 tablet by mouth 2 (two) times daily. 60 tablet 0   aspirin EC 81 MG tablet Take 1 tablet (81 mg total) by mouth daily. 90 tablet 3   blood glucose meter kit and supplies KIT Dispense based on patient and insurance preference. Use up to four times daily as directed. (FOR ICD-9 250.00, 250.01). 1 each 0   buPROPion (WELLBUTRIN XL) 150 MG 24 hr tablet Take 1 tablet (150 mg total) by mouth daily. Take with the $Remove'300mg'lTODiiW$ . 30 tablet 1   buPROPion (WELLBUTRIN XL) 300 MG 24 hr tablet Take 1 tablet (300 mg total) by mouth daily. 30 tablet 1   diltiazem (CARDIZEM CD) 240 MG 24 hr capsule Take 1 capsule (240 mg total) by mouth daily. 30 capsule 5   EASY COMFORT PEN NEEDLES 31G X 8 MM MISC use once a day with victoza  EASY PLUS II GLUCOSE TEST test strip use to test blood sugar once daily 100 each 11   escitalopram (LEXAPRO) 20 MG tablet Take twice a day 60 tablet 1   gabapentin (NEURONTIN) 300 MG capsule Take 300 mg by mouth at bedtime.     Hylan (SYNVISC) 16 MG/2ML SOSY Inject 1 syringe into the knee weekly x3 6 mL 3   indomethacin (INDOCIN) 50 MG capsule 1 capsule p.o. twice daily as needed for pain 60 capsule 11   lisinopril (ZESTRIL) 10 MG tablet Take  1 tablet (10 mg total) by mouth daily. Labs for further refills 15 tablet 0   NARCAN 4 MG/0.1ML LIQD nasal spray kit Inject solution in to one nostril for suspected overdose. Repeat in 3-5 minutes if necessary in the other nostril. CALL 911  0   oxyCODONE (ROXICODONE) 15 MG immediate release tablet Take 1 tablet (15 mg total) by mouth every 4 (four) hours as needed for pain. 12 tablet 0   pravastatin (PRAVACHOL) 40 MG tablet Take one tablet by mouth daily 30 tablet 0   Testosterone 20.25 MG/ACT (1.62%) GEL Apply 4 Pump topically daily. 150 g 1   tiZANidine (ZANAFLEX) 4 MG tablet Take 4 mg by mouth once.      VICTOZA 18 MG/3ML SOPN Inject 0.3 mLs (1.8 mg total) into the skin daily. (54/1.8=30) 9 mL 0   zolpidem (AMBIEN CR) 12.5 MG CR tablet Take 1 tablet (12.5 mg total) by mouth at bedtime. 30 tablet 0   No current facility-administered medications for this visit.     Past Medical History:  Diagnosis Date   Anemia    Diabetes mellitus    controlled   ED (erectile dysfunction)    Gout    Kidney stones    stent, lithotripsy   Lymphadenopathy    Morbid obesity (HCC)    OSA (obstructive sleep apnea)    CPAP-17.5 cm water pressure   Paroxysmal atrial fibrillation (HCC)    Pneumonia    S/P emergency tracheotomy for assistance in breathing ([place])    at age 59   Testosterone deficiency    Vitamin D deficiency     Past Surgical History:  Procedure Laterality Date   CHOLECYSTECTOMY     CYSTOSCOPY     retrograde and double J catheter insertion.   FINGER SURGERY     ulnar digital nerve and artery right index finger   GASTRIC BYPASS  2005   600 lbs prior to surgery   TONSILLECTOMY      Social History   Socioeconomic History   Marital status: Married    Spouse name: Not on file   Number of children: Not on file   Years of education: Not on file   Highest education level: Not on file  Occupational History   Not on file  Tobacco Use   Smoking status: Never   Smokeless tobacco:  Never  Substance and Sexual Activity   Alcohol use: No   Drug use: No    Comment: prior cocaine abuse (heavy) in his 17s.  denies subsequent use   Sexual activity: Yes    Partners: Female    Birth control/protection: None    Comment: on full disability, separated, no regular exercise,walks some, drinks pot of coffee a day.  Other Topics Concern   Not on file  Social History Narrative   Drinks one pot of coffee daily.   Regular exercise-no, walks some      Lives in [place] with  roommate.   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   Social Determinants of Health   Financial Resource Strain: Not on file  Food Insecurity: Not on file  Transportation Needs: Not on file  Physical Activity: Not on file  Stress: Not on file  Social Connections: Not on file  Intimate Partner Violence: Not on file    Family History  Problem Relation Age of Onset   Cancer Mother        lung, heavy smoker   Hypertension Mother    Hyperlipidemia Mother    Cancer Father        melanoma   Aneurysm Father        cardiac   Hyperlipidemia Father    Hypertension Father    Hyperlipidemia Sister    Hypertension Sister    Hyperlipidemia Brother    Hypertension Brother    Stroke Other     ROS: Arthralgias but no fevers or chills, productive cough, hemoptysis, dysphasia, odynophagia, melena, hematochezia, dysuria, hematuria, rash, seizure activity, orthopnea, PND, pedal edema, claudication. Remaining systems are negative.  Physical Exam: Well-developed well-nourished in no acute distress.  Skin is warm and dry.  HEENT is normal.  Neck is supple.  Chest is clear to auscultation with normal expansion.  Cardiovascular exam is regular rate and rhythm.  Abdominal exam nontender or distended. No masses palpated. Extremities show no edema. neuro grossly intact  ECG-sinus rhythm with first-degree AV block and left bundle branch block personally reviewed  A/P  1 paroxysmal  H fibrillation-patient remains in sinus.  Continue amiodarone.  Check chest x-ray.  Recent TSH in February normal as well as liver functions.  As outlined in HPI patient had a life-threatening bleed on anticoagulation and has declined resuming.  He understands the higher risk of CVA.  We will continue aspirin.   2 hypertension-blood pressure controlled.  Continue present medications and follow.  3 previous chest pain-patient has had no recurrences and feels well.  Previous nuclear study suggestive of ischemia but may have been false positive in the setting of obesity.  We will continue medical therapy at this point.  4 morbid obesity-we discussed importance of weight loss  5 hyperlipidemia-continue statin.  Kirk Ruths, MD

## 2020-10-23 ENCOUNTER — Ambulatory Visit (INDEPENDENT_AMBULATORY_CARE_PROVIDER_SITE_OTHER): Payer: 59

## 2020-10-23 ENCOUNTER — Other Ambulatory Visit: Payer: Self-pay

## 2020-10-23 ENCOUNTER — Ambulatory Visit (INDEPENDENT_AMBULATORY_CARE_PROVIDER_SITE_OTHER): Payer: 59 | Admitting: Cardiology

## 2020-10-23 ENCOUNTER — Encounter: Payer: Self-pay | Admitting: Cardiology

## 2020-10-23 VITALS — BP 106/72 | HR 64 | Ht 69.5 in | Wt 334.1 lb

## 2020-10-23 DIAGNOSIS — Z79899 Other long term (current) drug therapy: Secondary | ICD-10-CM | POA: Diagnosis not present

## 2020-10-23 DIAGNOSIS — I10 Essential (primary) hypertension: Secondary | ICD-10-CM

## 2020-10-23 DIAGNOSIS — I4891 Unspecified atrial fibrillation: Secondary | ICD-10-CM

## 2020-10-23 DIAGNOSIS — R918 Other nonspecific abnormal finding of lung field: Secondary | ICD-10-CM | POA: Diagnosis not present

## 2020-10-23 DIAGNOSIS — Q2547 Right aortic arch: Secondary | ICD-10-CM | POA: Diagnosis not present

## 2020-10-23 NOTE — Patient Instructions (Signed)
  Testing/Procedures:  A chest x-ray takes a picture of the organs and structures inside the chest, including the heart, lungs, and blood vessels. This test can show several things, including, whether the heart is enlarges; whether fluid is building up in the lungs; and whether pacemaker / defibrillator leads are still in place.    Follow-Up: At [facility], you and your health needs are our priority.  As part of our continuing mission to provide you with exceptional heart care, we have created designated Provider Care Teams.  These Care Teams include your primary Cardiologist (physician) and Advanced Practice Providers (APPs -  Physician Assistants and Nurse Practitioners) who all work together to provide you with the care you need, when you need it.  We recommend signing up for the patient portal called "MyChart".  Sign up information is provided on this After Visit Summary.  MyChart is used to connect with patients for Virtual Visits (Telemedicine).  Patients are able to view lab/test results, encounter notes, upcoming appointments, etc.  Non-urgent messages can be sent to your provider as well.   To learn more about what you can do with MyChart, go to [email or web address].    Your next appointment:   12 month(s)  The format for your next appointment:   In Person  Provider:   Olga Millers, MD

## 2020-10-26 ENCOUNTER — Encounter: Payer: BLUE CROSS/BLUE SHIELD | Admitting: Family Medicine

## 2020-10-27 ENCOUNTER — Encounter: Payer: Self-pay | Admitting: *Deleted

## 2020-11-06 ENCOUNTER — Ambulatory Visit: Payer: 59 | Admitting: Sports Medicine

## 2020-11-06 ENCOUNTER — Encounter: Payer: 59 | Admitting: Family Medicine

## 2020-11-06 ENCOUNTER — Other Ambulatory Visit: Payer: Self-pay | Admitting: Cardiology

## 2020-11-06 ENCOUNTER — Other Ambulatory Visit: Payer: Self-pay | Admitting: Family Medicine

## 2020-11-06 DIAGNOSIS — G4709 Other insomnia: Secondary | ICD-10-CM

## 2020-11-06 DIAGNOSIS — Z79899 Other long term (current) drug therapy: Secondary | ICD-10-CM | POA: Diagnosis not present

## 2020-11-07 ENCOUNTER — Other Ambulatory Visit: Payer: Self-pay

## 2020-11-10 ENCOUNTER — Other Ambulatory Visit (HOSPITAL_COMMUNITY): Payer: Self-pay | Admitting: Psychiatry

## 2020-11-10 NOTE — Telephone Encounter (Signed)
Last VV-10/11/20 Last refill-10/12/20

## 2020-11-14 ENCOUNTER — Other Ambulatory Visit: Payer: Self-pay | Admitting: Family Medicine

## 2020-11-14 DIAGNOSIS — G4709 Other insomnia: Secondary | ICD-10-CM

## 2020-11-14 NOTE — Telephone Encounter (Signed)
I am unable to refuse this medication. Please refuse.

## 2020-11-16 ENCOUNTER — Other Ambulatory Visit: Payer: Self-pay

## 2020-11-16 DIAGNOSIS — G4709 Other insomnia: Secondary | ICD-10-CM

## 2020-11-16 MED ORDER — ZOLPIDEM TARTRATE ER 12.5 MG PO TBCR: 12.5000 mg | EXTENDED_RELEASE_TABLET | Freq: Every day | ORAL | 0 refills | Status: DC

## 2020-11-16 NOTE — Telephone Encounter (Signed)
Rodney Bright called and states he did schedule an appointment on 12/05/2020. He needs a refill of Ambien to get him through to his appointment.

## 2020-11-16 NOTE — Telephone Encounter (Signed)
Patient advised.

## 2020-11-23 ENCOUNTER — Ambulatory Visit (INDEPENDENT_AMBULATORY_CARE_PROVIDER_SITE_OTHER): Payer: 59

## 2020-11-23 ENCOUNTER — Ambulatory Visit (INDEPENDENT_AMBULATORY_CARE_PROVIDER_SITE_OTHER): Payer: 59 | Admitting: Sports Medicine

## 2020-11-23 DIAGNOSIS — M722 Plantar fascial fibromatosis: Secondary | ICD-10-CM | POA: Diagnosis not present

## 2020-11-23 DIAGNOSIS — M170 Bilateral primary osteoarthritis of knee: Secondary | ICD-10-CM

## 2020-11-23 DIAGNOSIS — M1712 Unilateral primary osteoarthritis, left knee: Secondary | ICD-10-CM

## 2020-11-23 MED ORDER — SYNVISC 16 MG/2ML IX SOSY: PREFILLED_SYRINGE | INTRA_ARTICULAR | 1 refills | Status: DC

## 2020-11-23 NOTE — Progress Notes (Signed)
    Procedures performed today:    Procedure: Real-time Ultrasound Guided injection of the left knee Device: Samsung HS60  Verbal informed consent obtained.  Time-out conducted.  Noted no overlying erythema, induration, or other signs of local infection.  Skin prepped in a sterile fashion.  Local anesthesia: Topical Ethyl chloride.  With sterile technique and under real time ultrasound guidance: Noted effusion, 1 cc Kenalog 40, 2 cc lidocaine, 2 cc bupivacaine injected easily Completed without difficulty  Advised to call if fevers/chills, erythema, induration, drainage, or persistent bleeding.  Images permanently stored and available for review in PACS.  Impression: Technically successful ultrasound guided injection.  Procedure: Real-time Ultrasound Guided aspiration/injection of the right knee Device: Samsung HS60  Verbal informed consent obtained.  Time-out conducted.  Noted no overlying erythema, induration, or other signs of local infection.  Skin prepped in a sterile fashion.  Local anesthesia: Topical Ethyl chloride.  With sterile technique and under real time ultrasound guidance: Noted effusion aspirated 50 mL of clear, straw-colored fluid, 1 cc Kenalog 40, 2 cc lidocaine, 2 cc bupivacaine injected easily Completed without difficulty  Advised to call if fevers/chills, erythema, induration, drainage, or persistent bleeding.  Images permanently stored and available for review in PACS.  Impression: Technically successful ultrasound guided injection.  Independent interpretation of notes and tests performed by another provider:   None.  Brief History, Exam, Impression, and Recommendations:    Primary osteoarthritis and CPPD of both knees Rodney Bright Stable returns, he is a pleasant 60 year old male, we did a series of Synvisc in late January/early February. He is now having recurrence of pain, today we did bilateral knee injections, I am also to get him set up for Synvisc again, I will send  the syringes and a prescription to his mail order pharmacy. He can return to start the series.  Plantar fascial fibromatosis of left foot Home rehab given, referral for custom orthotics, if not sufficiently better after a month we will do a plantar fascial fibroma injection.    ___________________________________________ Ihor Austin. Benjamin Stain, M.D., ABFM., CAQSM. Primary Care and Sports Medicine [facility]  Adjunct Instructor of Family Medicine  [employer]

## 2020-11-23 NOTE — Assessment & Plan Note (Signed)
Home rehab given, referral for custom orthotics, if not sufficiently better after a month we will do a plantar fascial fibroma injection.

## 2020-11-23 NOTE — Assessment & Plan Note (Signed)
Rodney Bright returns, he is a pleasant 60 year old male, we did a series of Synvisc in late January/early February. He is now having recurrence of pain, today we did bilateral knee injections, I am also to get him set up for Synvisc again, I will send the syringes and a prescription to his mail order pharmacy. He can return to start the series.

## 2020-11-30 ENCOUNTER — Telehealth: Payer: Self-pay

## 2020-11-30 DIAGNOSIS — G4709 Other insomnia: Secondary | ICD-10-CM

## 2020-11-30 NOTE — Telephone Encounter (Signed)
Pt called and said that he was given a 2 week supply of Ambien to bridge him through until his appt on 12/05/20 but the refill only bridged him until today. He is requesting a short supply of 5-6 days to be sent to his pharmacy until his appointment.   He is also wanting to know if labs can be entered so that he can have them done and discuss the results during his appointment.   He is also asking for a refill of indomethacin 50 mg

## 2020-12-01 ENCOUNTER — Other Ambulatory Visit: Payer: Self-pay

## 2020-12-01 MED ORDER — ZOLPIDEM TARTRATE ER 12.5 MG PO TBCR: 12.5000 mg | EXTENDED_RELEASE_TABLET | Freq: Every day | ORAL | 0 refills | Status: DC

## 2020-12-01 MED ORDER — INDOMETHACIN 50 MG PO CAPS: ORAL_CAPSULE | ORAL | 11 refills | Status: DC

## 2020-12-01 NOTE — Telephone Encounter (Signed)
Patient aware refill sent to pharmacy.

## 2020-12-01 NOTE — Telephone Encounter (Signed)
Meds ordered this encounter  Medications   zolpidem (AMBIEN CR) 12.5 MG CR tablet    Sig: Take 1 tablet (12.5 mg total) by mouth at bedtime.    Dispense:  7 tablet    Refill:  0

## 2020-12-01 NOTE — Telephone Encounter (Signed)
Patient calling for a refill of his indocin.  Last OV: 11/23/20 Last fill: 12/13/19 #60 w/ 11 refills

## 2020-12-04 ENCOUNTER — Ambulatory Visit: Payer: 59 | Admitting: Sports Medicine

## 2020-12-05 ENCOUNTER — Other Ambulatory Visit: Payer: Self-pay

## 2020-12-05 ENCOUNTER — Telehealth: Payer: Self-pay | Admitting: Family Medicine

## 2020-12-05 ENCOUNTER — Ambulatory Visit (INDEPENDENT_AMBULATORY_CARE_PROVIDER_SITE_OTHER): Payer: 59 | Admitting: Family Medicine

## 2020-12-05 ENCOUNTER — Encounter: Payer: Self-pay | Admitting: Family Medicine

## 2020-12-05 VITALS — BP 122/70 | HR 54 | Ht 69.5 in | Wt 353.0 lb

## 2020-12-05 DIAGNOSIS — E611 Iron deficiency: Secondary | ICD-10-CM

## 2020-12-05 DIAGNOSIS — G4709 Other insomnia: Secondary | ICD-10-CM

## 2020-12-05 DIAGNOSIS — F988 Other specified behavioral and emotional disorders with onset usually occurring in childhood and adolescence: Secondary | ICD-10-CM

## 2020-12-05 DIAGNOSIS — E118 Type 2 diabetes mellitus with unspecified complications: Secondary | ICD-10-CM | POA: Diagnosis not present

## 2020-12-05 DIAGNOSIS — Z794 Long term (current) use of insulin: Secondary | ICD-10-CM

## 2020-12-05 DIAGNOSIS — Z0000 Encounter for general adult medical examination without abnormal findings: Secondary | ICD-10-CM

## 2020-12-05 DIAGNOSIS — E559 Vitamin D deficiency, unspecified: Secondary | ICD-10-CM

## 2020-12-05 DIAGNOSIS — E291 Testicular hypofunction: Secondary | ICD-10-CM

## 2020-12-05 DIAGNOSIS — Z23 Encounter for immunization: Secondary | ICD-10-CM

## 2020-12-05 LAB — POCT GLYCOSYLATED HEMOGLOBIN (HGB A1C): Hemoglobin A1C: 6.2 % — AB (ref 4.0–5.6)

## 2020-12-05 MED ORDER — LISINOPRIL 10 MG PO TABS: 10.0000 mg | ORAL_TABLET | Freq: Every day | ORAL | 1 refills | Status: DC

## 2020-12-05 MED ORDER — PRAVASTATIN SODIUM 40 MG PO TABS: 40.0000 mg | ORAL_TABLET | Freq: Every day | ORAL | 3 refills | Status: DC

## 2020-12-05 MED ORDER — AMPHETAMINE-DEXTROAMPHETAMINE 30 MG PO TABS: 1.0000 | ORAL_TABLET | Freq: Two times a day (BID) | ORAL | 0 refills | Status: DC

## 2020-12-05 MED ORDER — ZOLPIDEM TARTRATE ER 12.5 MG PO TBCR: 12.5000 mg | EXTENDED_RELEASE_TABLET | Freq: Every day | ORAL | 1 refills | Status: DC

## 2020-12-05 MED ORDER — TESTOSTERONE 20.25 MG/ACT (1.62%) TD GEL: 4.0000 | Freq: Every day | TRANSDERMAL | 1 refills | Status: DC

## 2020-12-05 NOTE — Telephone Encounter (Signed)
Needs prior authorization on his testosterone therapy.  Not sure if we are already working on this he thought that we were but I did not see any notes in regards to this.  Meds ordered this encounter  Medications   Testosterone 20.25 MG/ACT (1.62%) GEL    Sig: Apply 4 Pump topically daily.    Dispense:  150 g    Refill:  1

## 2020-12-05 NOTE — Progress Notes (Signed)
CPE  Established Patient Office Visit  Subjective:  Patient ID: Rodney Bright, male    DOB: 1960-04-26  Age: 60 y.o. MRN: [id number]  CC:  Chief Complaint  Patient presents with   Annual Exam    HPI Rodney Bright presents for CPE.  Doing well overall does need refills on medications.  Had a panel blood work in February.  Did have his eye exam done at my eye doctor this year.  Colonoscopy was in 2015.  Last Tdap was in 2021.  Due for flu vaccine today.  Pneumonia vaccine up-to-date.  Hasn't heard back from the prior authorization for his AndroGel.  So he has been off of it for several weeks.  He is also been out of his statin for probably about 4 months.  Does go to my eye doctor for his eye care.  We will call to get most up-to-date report.  Last colonoscopy was in 2015.  Tdap and pneumonia vaccines are up-to-date.  Been having a lot of pain and joint problems particularly in his knees he has been working with Rodney Bright, our sports med doc and will start getting Synvisc injections.  Is hoping that this will really improve his ability to be active he was walking regularly before his knees became more problematic and he would really like to get back into that.  Past Medical History:  Diagnosis Date   Anemia    Diabetes mellitus    controlled   ED (erectile dysfunction)    Gout    Kidney stones    stent, lithotripsy   Lymphadenopathy    Morbid obesity (HCC)    OSA (obstructive sleep apnea)    CPAP-17.5 cm water pressure   Paroxysmal atrial fibrillation (HCC)    Pneumonia    S/P emergency tracheotomy for assistance in breathing (Rodney Bright)    at age 66   Testosterone deficiency    Vitamin D deficiency     Past Surgical History:  Procedure Laterality Date   CHOLECYSTECTOMY     CYSTOSCOPY     retrograde and double J catheter insertion.   FINGER SURGERY     ulnar digital nerve and artery right index finger   GASTRIC BYPASS  2005   600 lbs prior to surgery   TONSILLECTOMY       Family History  Problem Relation Age of Onset   Cancer Mother        lung, heavy smoker   Hypertension Mother    Hyperlipidemia Mother    Cancer Father        melanoma   Aneurysm Father        cardiac   Hyperlipidemia Father    Hypertension Father    Hyperlipidemia Sister    Hypertension Sister    Hyperlipidemia Brother    Hypertension Brother    Stroke Other     Social History   Socioeconomic History   Marital status: Married    Spouse name: Not on file   Number of children: Not on file   Years of education: Not on file   Highest education level: Not on file  Occupational History   Not on file  Tobacco Use   Smoking status: Never   Smokeless tobacco: Never  Substance and Sexual Activity   Alcohol use: No   Drug use: No    Comment: prior cocaine abuse (heavy) in his 66s.  denies subsequent use   Sexual activity: Yes    Partners: Female    Birth  control/protection: None    Comment: on full disability, separated, no regular exercise,walks some, drinks pot of coffee a day.  Other Topics Concern   Not on file  Social History Narrative   Drinks one pot of coffee daily.   Regular exercise-no, walks some      Lives in [place] with roommate.   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   Social Determinants of Health   Financial Resource Strain: Not on file  Food Insecurity: Not on file  Transportation Needs: Not on file  Physical Activity: Not on file  Stress: Not on file  Social Connections: Not on file  Intimate Partner Violence: Not on file    Outpatient Medications Prior to Visit  Medication Sig Dispense Refill   acetaminophen (TYLENOL) 650 MG CR tablet Take 1 tablet (650 mg total) by mouth every 8 (eight) hours as needed for pain. 90 tablet 3   alfuzosin (UROXATRAL) 10 MG 24 hr tablet Take 10 mg by mouth daily.  10   allopurinol (ZYLOPRIM) 100 MG tablet Take 1 tablet (100 mg total) by mouth daily. 90 tablet 3    AMBULATORY NON FORMULARY MEDICATION Medication Name: CPAP machine with nasal mask and supplies set to 17.5 cm water pressure.  No humidifier.  Sent to [facility].  See sleep study from 2009 attached.  Dx: OSA. 1 Units PRN.   AMBULATORY NON FORMULARY MEDICATION Medication Name: Cyclobenzaprine 2% gabapentin 3% Baclofen 2%  Diclofenac 3% Qty# 120 ml Sig: apply small amount topically to effected area 2-3 times a day as directed Fax to [phone] 120 mL 0   amiodarone (PACERONE) 200 MG tablet take 1 tablet(s) by mouth daily 90 tablet 3   aspirin EC 81 MG tablet Take 1 tablet (81 mg total) by mouth daily. 90 tablet 3   blood glucose meter kit and supplies KIT Dispense based on patient and insurance preference. Use up to four times daily as directed. (FOR ICD-9 250.00, 250.01). 1 each 0   buPROPion (WELLBUTRIN XL) 150 MG 24 hr tablet Take 1 tablet (150 mg total) by mouth daily. Take with the $Remove'300mg'QsnmVRg$ . 30 tablet 1   buPROPion (WELLBUTRIN XL) 300 MG 24 hr tablet Take 1 tablet (300 mg total) by mouth daily. 30 tablet 1   diltiazem (CARDIZEM CD) 240 MG 24 hr capsule Take 1 capsule (240 mg total) by mouth daily. 30 capsule 5   EASY COMFORT PEN NEEDLES 31G X 8 MM MISC use once a day with victoza     EASY PLUS II GLUCOSE TEST test strip use to test blood sugar once daily 100 each 11   escitalopram (LEXAPRO) 20 MG tablet Take twice a day 60 tablet 1   gabapentin (NEURONTIN) 300 MG capsule Take 300 mg by mouth at bedtime.     Hylan (SYNVISC) 16 MG/2ML SOSY Inject 1 syringe into each knee weekly x3, diagnosis: Bilateral knee osteoarthritis, need 6 total syringes. 12 mL 1   amphetamine-dextroamphetamine (ADDERALL) 30 MG tablet Take 1 tablet by mouth 2 (two) times daily. 60 tablet 0   amphetamine-dextroamphetamine (ADDERALL) 30 MG tablet Take 1 tablet by mouth 2 (two) times daily. 60 tablet 0   indomethacin (INDOCIN) 50 MG capsule 1 capsule p.o. twice daily as needed for pain 60 capsule 11   NARCAN 4 MG/0.1ML LIQD nasal  spray kit Inject solution in to one nostril for suspected overdose. Repeat in 3-5 minutes if necessary in the other nostril. CALL 911  0  oxyCODONE (ROXICODONE) 15 MG immediate release tablet Take 1 tablet (15 mg total) by mouth every 4 (four) hours as needed for pain. 12 tablet 0   tiZANidine (ZANAFLEX) 4 MG tablet Take 4 mg by mouth once.      VICTOZA 18 MG/3ML SOPN Inject 0.3 mLs (1.8 mg total) into the skin daily. (54/1.8=30) 9 mL 0   lisinopril (ZESTRIL) 10 MG tablet Take 1 tablet (10 mg total) by mouth daily. Labs for further refills 15 tablet 0   pravastatin (PRAVACHOL) 40 MG tablet Take one tablet by mouth daily 30 tablet 0   Testosterone 20.25 MG/ACT (1.62%) GEL Apply 4 Pump topically daily. 150 g 1   zolpidem (AMBIEN CR) 12.5 MG CR tablet Take 1 tablet (12.5 mg total) by mouth at bedtime. 7 tablet 0   No facility-administered medications prior to visit.    Allergies  Allergen Reactions   Colchicine Shortness Of Breath, Swelling and Itching   Prunus Persica Swelling    "Peaches" " swelling of face"   Peach Flavor     "Peaches" " swelling of face"    ROS Review of Systems    Objective:    Physical Exam Constitutional:      Appearance: He is well-developed.  HENT:     Head: Normocephalic and atraumatic.     Right Ear: External ear normal.     Left Ear: External ear normal.     Nose: Nose normal.  Eyes:     Conjunctiva/sclera: Conjunctivae normal.     Pupils: Pupils are equal, round, and reactive to light.  Neck:     Thyroid: No thyromegaly.  Cardiovascular:     Rate and Rhythm: Normal rate and regular rhythm.     Heart sounds: Normal heart sounds.  Pulmonary:     Effort: Pulmonary effort is normal.     Breath sounds: Normal breath sounds.  Abdominal:     General: Bowel sounds are normal. There is no distension.     Palpations: Abdomen is soft. There is no mass.     Tenderness: There is no abdominal tenderness. There is no guarding or rebound.   Musculoskeletal:        General: Normal range of motion.     Cervical back: Normal range of motion and neck supple.  Lymphadenopathy:     Cervical: No cervical adenopathy.  Skin:    General: Skin is warm and dry.  Neurological:     Mental Status: He is alert and oriented to person, place, and time.     Deep Tendon Reflexes: Reflexes are normal and symmetric.  Psychiatric:        Behavior: Behavior normal.        Thought Content: Thought content normal.        Judgment: Judgment normal.    BP 122/70   Pulse (!) 54   Ht 5' 9.5" (1.765 m)   Wt (!) 353 lb (160.1 kg)   SpO2 99%   BMI 51.38 kg/m  Wt Readings from Last 3 Encounters:  12/05/20 (!) 353 lb (160.1 kg)  10/23/20 (!) 334 lb 1.9 oz (151.6 kg)  05/23/20 (!) 342 lb (155.1 kg)     Health Maintenance Due  Topic Date Due   Zoster Vaccines- Shingrix (2 of 2) 08/17/2018   COVID-19 Vaccine (2 - Janssen risk series) 08/14/2019   Pneumococcal Vaccine 37-26 Years old (3 - PPSV23 or PCV20) 04/16/2020   OPHTHALMOLOGY EXAM  06/06/2020   FOOT EXAM  07/27/2020  There are no preventive care reminders to display for this patient.  Lab Results  Component Value Date   TSH 1.72 06/04/2019   Lab Results  Component Value Date   WBC 4.7 08/03/2018   HGB 12.5 (L) 08/03/2018   HCT 39.5 08/03/2018   MCV 85.9 08/03/2018   PLT 203 08/03/2018   Lab Results  Component Value Date   NA 141 06/04/2019   K 4.6 06/04/2019   CO2 27 06/04/2019   GLUCOSE 134 (H) 06/04/2019   BUN 21 06/04/2019   CREATININE 1.38 (H) 06/04/2019   BILITOT 0.6 06/04/2019   ALKPHOS 87 04/24/2017   AST 21 06/04/2019   ALT 17 06/04/2019   PROT 6.6 06/04/2019   ALBUMIN 4.1 04/22/2017   CALCIUM 9.1 06/04/2019   Lab Results  Component Value Date   CHOL 145 06/04/2019   Lab Results  Component Value Date   HDL 41 06/04/2019   Lab Results  Component Value Date   LDLCALC 84 06/04/2019   Lab Results  Component Value Date   TRIG 104 06/04/2019    Lab Results  Component Value Date   CHOLHDL 3.5 06/04/2019   Lab Results  Component Value Date   HGBA1C 6.2 (A) 12/05/2020      Assessment & Plan:   Problem List Items Addressed This Visit       Endocrine   Controlled diabetes mellitus type 2 with complications (HCC)   Relevant Medications   pravastatin (PRAVACHOL) 40 MG tablet   lisinopril (ZESTRIL) 10 MG tablet   Other Relevant Orders   POCT glycosylated hemoglobin (Hb A1C) (Completed)     Other   Other insomnia   Relevant Medications   zolpidem (AMBIEN CR) 12.5 MG CR tablet   Attention deficit disorder (ADD) without hyperactivity   Relevant Medications   amphetamine-dextroamphetamine (ADDERALL) 30 MG tablet   Other Visit Diagnoses     Wellness examination    -  Primary   Relevant Orders   POCT glycosylated hemoglobin (Hb A1C) (Completed)   BASIC METABOLIC PANEL WITH GFR   Testosterone Total,Free,Bio, Males   CBC   VITAMIN D 25 Hydroxy (Vit-D Deficiency, Fractures)   Need for immunization against influenza       Relevant Orders   Flu Vaccine QUAD 79mo+IM (Fluarix, Fluzone & Alfiuria Quad PF) (Completed)   Vitamin D deficiency       Relevant Orders   VITAMIN D 25 Hydroxy (Vit-D Deficiency, Fractures)   Low iron       Relevant Orders   Fe+TIBC+Fer      Keep up a regular exercise program and make sure you are eating a healthy diet Try to eat 4 servings of dairy a day, or if you are lactose intolerant take a calcium with vitamin D daily.  Your vaccines are up to date.  Discussed strategies around exercise such as doing stationary bike or even water exercise to take pressure off of his joints.  He has gained back about 30 pounds and knows that he needs to work on his diet. Check on the prior authorization for his testosterone. Get updated labs today.  He did have a lipid panel in February with an LDL of 84.    Meds ordered this encounter  Medications   zolpidem (AMBIEN CR) 12.5 MG CR tablet    Sig: Take  1 tablet (12.5 mg total) by mouth at bedtime.    Dispense:  90 tablet    Refill:  1   pravastatin (PRAVACHOL) 40  MG tablet    Sig: Take 1 tablet (40 mg total) by mouth daily.    Dispense:  90 tablet    Refill:  3   lisinopril (ZESTRIL) 10 MG tablet    Sig: Take 1 tablet (10 mg total) by mouth daily. Labs for further refills    Dispense:  90 tablet    Refill:  1   amphetamine-dextroamphetamine (ADDERALL) 30 MG tablet    Sig: Take 1 tablet by mouth 2 (two) times daily.    Dispense:  60 tablet    Refill:  0   amphetamine-dextroamphetamine (ADDERALL) 30 MG tablet    Sig: Take 1 tablet by mouth 2 (two) times daily.    Dispense:  60 tablet    Refill:  0    Follow-up: Return in about 4 months (around 04/06/2021) for Diabetes follow-up.    Beatrice Lecher, MD

## 2020-12-06 LAB — BASIC METABOLIC PANEL WITH GFR
BUN/Creatinine Ratio: 17 (calc) (ref 6–22)
BUN: 25 mg/dL (ref 7–25)
CO2: 29 mmol/L (ref 20–32)
Calcium: 9.5 mg/dL (ref 8.6–10.3)
Chloride: 102 mmol/L (ref 98–110)
Creat: 1.48 mg/dL — ABNORMAL HIGH (ref 0.70–1.35)
Glucose, Bld: 96 mg/dL (ref 65–99)
Potassium: 5.2 mmol/L (ref 3.5–5.3)
Sodium: 139 mmol/L (ref 135–146)
eGFR: 53 mL/min/{1.73_m2} — ABNORMAL LOW (ref >=60)

## 2020-12-06 LAB — CBC
HCT: 42.1 % (ref 38.5–50.0)
Hemoglobin: 13.8 g/dL (ref 13.2–17.1)
MCH: 28.8 pg (ref 27.0–33.0)
MCHC: 32.8 g/dL (ref 32.0–36.0)
MCV: 87.9 fL (ref 80.0–100.0)
MPV: 11 fL (ref 7.5–12.5)
Platelets: 220 10*3/uL (ref 140–400)
RBC: 4.79 10*6/uL (ref 4.20–5.80)
RDW: 14.7 % (ref 11.0–15.0)
WBC: 8.3 10*3/uL (ref 3.8–10.8)

## 2020-12-06 LAB — IRON,TIBC AND FERRITIN PANEL
%SAT: 17 % (calc) — ABNORMAL LOW (ref 20–48)
Ferritin: 11 ng/mL — ABNORMAL LOW (ref 24–380)
Iron: 70 ug/dL (ref 50–180)
TIBC: 417 mcg/dL (calc) (ref 250–425)

## 2020-12-06 LAB — TESTOSTERONE TOTAL,FREE,BIO, MALES
Albumin: 4.1 g/dL (ref 3.6–5.1)
Sex Hormone Binding: 52 nmol/L (ref 22–77)
Testosterone: 67 ng/dL — ABNORMAL LOW (ref 250–827)

## 2020-12-06 LAB — VITAMIN D 25 HYDROXY (VIT D DEFICIENCY, FRACTURES): Vit D, 25-Hydroxy: 24 ng/mL — ABNORMAL LOW (ref 30–100)

## 2020-12-06 NOTE — Progress Notes (Signed)
Hi Rodney Bright, kidney function is stable.  Testosterone is low but that is not surprising as you have been out of the medication.  Your blood count looks great.  Your vitamin D is low so make sure you are still taking a vitamin D supplement.  Your iron stores are also on the low end.  Just make sure you are taking an iron supplement as well.  I do make some plant-based iron products if you have a hard time with the traditional over-the-counter supplements.  Please schedule diabetic follow-up in 3 months.

## 2020-12-07 ENCOUNTER — Telehealth: Payer: Self-pay

## 2020-12-07 ENCOUNTER — Encounter (HOSPITAL_COMMUNITY): Payer: Self-pay

## 2020-12-07 ENCOUNTER — Ambulatory Visit (INDEPENDENT_AMBULATORY_CARE_PROVIDER_SITE_OTHER): Payer: 59 | Admitting: Licensed Clinical Social Worker

## 2020-12-07 DIAGNOSIS — F331 Major depressive disorder, recurrent, moderate: Secondary | ICD-10-CM

## 2020-12-07 DIAGNOSIS — F411 Generalized anxiety disorder: Secondary | ICD-10-CM

## 2020-12-07 DIAGNOSIS — R69 Illness, unspecified: Secondary | ICD-10-CM | POA: Diagnosis not present

## 2020-12-07 NOTE — Telephone Encounter (Signed)
Medication: Testosterone 20.25 MG/ACT (1.62%) GEL Prior authorization submitted via CoverMyMeds on 12/07/2020 PA submission pending Pt aware via MyChart
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
## 2020-12-07 NOTE — Telephone Encounter (Signed)
Medication: amphetamine-dextroamphetamine (ADDERALL) 30 MG tablet Prior authorization determination received via real time PA Medication has been approved Approval dates: will be provided on faxed PA approval letter  Patient aware via: MyChart Pharmacy aware: Yes Provider aware via this encounter

## 2020-12-07 NOTE — Telephone Encounter (Signed)
Medication: zolpidem (AMBIEN CR) 12.5 MG CR tablet Prior authorization submitted via CoverMyMeds on 12/07/2020 PA submission pending Pt aware via MyChart

## 2020-12-13 ENCOUNTER — Telehealth: Payer: Self-pay | Admitting: *Deleted

## 2020-12-13 NOTE — Telephone Encounter (Signed)
Pt lvm stating that the pharmacy doesn't have the Zolpidem.    Called pt's pharmacy and spoke w/Tracy and she informed me that he can pick this up today.

## 2020-12-13 NOTE — Telephone Encounter (Signed)
Medication: Testosterone 20.25 MG/ACT (1.62%) GEL Prior authorization determination received Medication has been denied Reason for denial:  Examples of alternatives to the requested drug listed on your plans formulary are: testosterone transdermal gel 25 mg/2.5 g (1%); testosterone transdermal gel 10 mg/act (2%); testosterone cypionate; testosterone enanthate. Your prescriber will be responsible for determining what alternative is appropriate for you. This list may not include all alternatives covered by your plan and may include alternatives that require prior authorization. Please refer to your plan documents for a complete list of alternatives. Requirement: you must have tried 3 alternatives if there are 3 or more covered alternatives, 2 alternatives if there are 2 covered alternatives, or 1 alternative if there is only 1 covered alternative.

## 2020-12-13 NOTE — Telephone Encounter (Signed)
Medication: zolpidem (AMBIEN CR) 12.5 MG CR tablet Prior authorization determination received Medication has been approved Approval dates: 12/09/2020-12/10/2023  Patient aware via: MyChart Pharmacy aware: Yes Provider aware via this encounter

## 2020-12-14 MED ORDER — TESTOSTERONE 10 MG/ACT (2%) TD GEL: 3.0000 | Freq: Every day | TRANSDERMAL | 3 refills | Status: DC

## 2020-12-14 NOTE — Telephone Encounter (Signed)
Please notify patient of denial.  They will cover the 2%.  Some get a send over new prescription and he will do 3 pumps instead of 4 pumps since it is actually a little bit more concentrated than the 1.62% he was previously on.

## 2020-12-15 NOTE — Telephone Encounter (Signed)
LVM for pt to call to discuss.  T. Kaiser Belluomini, CMA  

## 2020-12-17 DIAGNOSIS — Z79899 Other long term (current) drug therapy: Secondary | ICD-10-CM | POA: Diagnosis not present

## 2020-12-18 NOTE — Telephone Encounter (Signed)
Medication: Testosterone 10 MG/ACT (2%) GEL Prior authorization determination received Medication has been approved Approval dates: 12/18/2020-12/19/2023  Patient aware via: MyChart Pharmacy aware: Yes Provider aware via this encounter

## 2020-12-19 ENCOUNTER — Telehealth: Payer: Self-pay

## 2020-12-19 NOTE — Telephone Encounter (Signed)
Rodney Bright called and left a message stating he needs the testosterone to go to [facility].

## 2020-12-20 MED ORDER — TESTOSTERONE 10 MG/ACT (2%) TD GEL: 3.0000 | Freq: Every day | TRANSDERMAL | 3 refills | Status: DC

## 2020-12-20 NOTE — Telephone Encounter (Signed)
Meds ordered this encounter  Medications   Testosterone 10 MG/ACT (2%) GEL    Sig: Place 3 Pump onto the skin daily.    Dispense:  120 g    Refill:  3

## 2020-12-29 ENCOUNTER — Telehealth: Payer: Self-pay

## 2020-12-29 NOTE — Telephone Encounter (Signed)
Medication: indomethacin (INDOCIN) 50 MG capsule Prior authorization submitted via CoverMyMeds on 12/29/2020 PA submission pending

## 2021-01-02 ENCOUNTER — Ambulatory Visit (INDEPENDENT_AMBULATORY_CARE_PROVIDER_SITE_OTHER): Payer: 59 | Admitting: Licensed Clinical Social Worker

## 2021-01-02 DIAGNOSIS — F411 Generalized anxiety disorder: Secondary | ICD-10-CM

## 2021-01-02 DIAGNOSIS — R69 Illness, unspecified: Secondary | ICD-10-CM | POA: Diagnosis not present

## 2021-01-02 DIAGNOSIS — F331 Major depressive disorder, recurrent, moderate: Secondary | ICD-10-CM | POA: Diagnosis not present

## 2021-01-02 NOTE — Telephone Encounter (Signed)
Medication: indomethacin (INDOCIN) 50 MG capsule Prior authorization determination received Medication has been denied Reason for denial: "This medication may be approved when: -The member has a clinical condition or needs a specific dosage form for which there is no alternative on the formulary OR -The listed formulary alternatives are not recommended based on published guidelines or clinical literature OR -The formulary alternatives will likely be ineffective or less effective for the member OR -The formulary alternatives will likely cause an adverse effect OR -The member is unable to take the required number of formulary alternatives for the given diagnosis due to a trial and inadequate treatment response or contraindication OR -The member has tried and failed the required number of formulary alternatives"

## 2021-01-03 ENCOUNTER — Encounter (HOSPITAL_COMMUNITY): Payer: Self-pay

## 2021-01-03 NOTE — Progress Notes (Signed)
   THERAPIST PROGRESS NOTE  Session Time: 8:00 am-8:50 am  Type of Therapy: Individual Therapy  Purpose of session: [name] will manage mood and anxiety as evidenced by challenging self defeating thoughts, increase motivation, and reduce unhealthy coping skills for 5 out of 7 days for 60 days.   Interventions: Therapist utilized CBT and Solution Focused brief therapy to address mood and anxiety. Therapist provided support and empathy to patient during session. Therapist processed patient's relationship to identify triggers for anxiety and mood. Therapist worked with patient to identify small steps to start making changes to improve mood.   Effectiveness: Patient was oriented x5 (person, place, situation, time, and object). Patient was casually dressed, and appropriately groomed. Patient was alert, engaged, pleasant, and cooperative. Patient was talkative during session. He shared that he knows the problem that is impacting him which is lack of motivation. After discussion, patient identified thoughts such as difficulty with aging, his girlfriend not saying I love you, that start his loop/cycle, he then feels down, uses an unhealthy coping skill, feels better for the moment, and then feels worse which continues the loop. He has not applied for jobs, etc due to lack of motivation and feeling down. 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   Patient engaged in session. He responded well to interventions. Patient continues to meet criteria for Generalized Anxiety Disorder and Major Depressive disorder, recurrent episode, moderate. Patient will continue in outpatient therapy due to being the least restrictive service to meet his needs. Patient made no progress on his goals.   Suicidal/Homicidal: NAwithout intent/plan  Plan: Return again in 1-2 weeks.  Diagnosis: Axis I: Generalized Anxiety  Disorder    Axis II: No diagnosis    Bynum Bellows, LCSW 01/03/2021

## 2021-01-03 NOTE — Plan of Care (Signed)
Patient participated in treatment plan °

## 2021-01-09 ENCOUNTER — Ambulatory Visit (INDEPENDENT_AMBULATORY_CARE_PROVIDER_SITE_OTHER): Payer: 59 | Admitting: Family Medicine

## 2021-01-09 ENCOUNTER — Telehealth: Payer: Self-pay

## 2021-01-09 ENCOUNTER — Other Ambulatory Visit: Payer: Self-pay

## 2021-01-09 ENCOUNTER — Encounter: Payer: Self-pay | Admitting: Family Medicine

## 2021-01-09 ENCOUNTER — Ambulatory Visit (INDEPENDENT_AMBULATORY_CARE_PROVIDER_SITE_OTHER): Payer: 59 | Admitting: Licensed Clinical Social Worker

## 2021-01-09 VITALS — BP 85/51 | HR 61 | Temp 98.3°F | Wt 353.0 lb

## 2021-01-09 DIAGNOSIS — R399 Unspecified symptoms and signs involving the genitourinary system: Secondary | ICD-10-CM

## 2021-01-09 DIAGNOSIS — F331 Major depressive disorder, recurrent, moderate: Secondary | ICD-10-CM

## 2021-01-09 DIAGNOSIS — Z87442 Personal history of urinary calculi: Secondary | ICD-10-CM

## 2021-01-09 DIAGNOSIS — R0602 Shortness of breath: Secondary | ICD-10-CM

## 2021-01-09 DIAGNOSIS — N3090 Cystitis, unspecified without hematuria: Secondary | ICD-10-CM

## 2021-01-09 DIAGNOSIS — F411 Generalized anxiety disorder: Secondary | ICD-10-CM

## 2021-01-09 DIAGNOSIS — I480 Paroxysmal atrial fibrillation: Secondary | ICD-10-CM

## 2021-01-09 DIAGNOSIS — R69 Illness, unspecified: Secondary | ICD-10-CM | POA: Diagnosis not present

## 2021-01-09 DIAGNOSIS — R42 Dizziness and giddiness: Secondary | ICD-10-CM

## 2021-01-09 LAB — POCT URINALYSIS DIP (CLINITEK)
Bilirubin, UA: NEGATIVE
Glucose, UA: NEGATIVE mg/dL
Ketones, POC UA: NEGATIVE mg/dL
Nitrite, UA: POSITIVE — AB
Spec Grav, UA: 1.02
Urobilinogen, UA: 0.2 U/dL
pH, UA: 5.5

## 2021-01-09 MED ORDER — ALLOPURINOL 100 MG PO TABS: 100.0000 mg | ORAL_TABLET | Freq: Every day | ORAL | 3 refills | Status: AC

## 2021-01-09 MED ORDER — NITROFURANTOIN MONOHYD MACRO 100 MG PO CAPS
100.0000 mg | ORAL_CAPSULE | Freq: Two times a day (BID) | ORAL | 0 refills | Status: DC
Start: 2021-01-09 — End: 2021-08-07

## 2021-01-09 NOTE — Patient Instructions (Addendum)
Take the antibiotic - we will let you know if the culture results change our treatment Stay hydrated! Hold lisinopril for a few days while BP is low. Given your severity of symptoms, we recommend you go to the emergency department.

## 2021-01-09 NOTE — Progress Notes (Signed)
   THERAPIST PROGRESS NOTE  Session Time: 8:00 am-8:50 am  Type of Therapy: Individual Therapy  Purpose of session: [name] will manage mood and anxiety as evidenced by challenging self defeating thoughts, increase motivation, and reduce unhealthy coping skills for 5 out of 7 days for 60 days.   Interventions: Therapist utilized CBT and Solution Focused brief therapy to address mood and anxiety. Therapist provided support and empathy to patient during session. Therapist explained basic motivations for behavior and assisted patient in connecting his behaviors to those basic motivations that keep him stuck.   Effectiveness: Patient was oriented x5 (person, place, situation, time, and object). Patient was casually dressed, and appropriately groomed. Patient was alert, engaged, pleasant, and cooperative. Patient was sick. He feels like he has a UTI. Patient has reduced his unhealthy coping skills. He has struggled for years in engaging in compulsive behaviors (eating, sex, etc) but struggles to do the things that would make his life better. Patient has a fear about trying to get a job. After discussion, patient understood that in the simplest terms we are moving toward pleasure, or moving away from pain. It is easier to engage in compulsive behaviors and experience the "instant" pleasure and it is more difficult to experience the "pain" of making improvements in life such as getting a job.   Patient engaged in session. He responded well to interventions. Patient continues to meet criteria for Generalized Anxiety Disorder and Major Depressive disorder, recurrent episode, moderate. Patient will continue in outpatient therapy due to being the least restrictive service to meet his needs. Patient made minimal progress on his goals.   Suicidal/Homicidal: NAwithout intent/plan  Plan: Return again in 1-2 weeks.  Diagnosis: Axis I: Generalized Anxiety Disorder    Axis II: No diagnosis    Bynum Bellows,  LCSW 01/09/2021

## 2021-01-09 NOTE — Progress Notes (Signed)
Acute Office Visit  Subjective:    Patient ID: Rodney Bright, male    DOB: 05/01/1960, 60 y.o.   MRN: [id number]  Chief Complaint  Patient presents with   Urinary Tract Infection    X 6 days   Dizziness    X 3 days   Shortness of Breath    X 3 days     HPI Patient is in today for urinary symptoms.   Patient states symptoms started 5-6 days ago - increased urination, frequency, urgency, malodorous urine, fever (103 on Saturday, now resolved), fatigue, dizziness, lightheadedness, headache, and fall/syncope at home over the weekend. He has not yet tried anything for his symptoms. States he does not have a way to check vital signs at home.  He denies any chest pain, palpitations, new edema, rashes, vision changes.     Past Medical History:  Diagnosis Date   Anemia    Diabetes mellitus    controlled   ED (erectile dysfunction)    Gout    Kidney stones    stent, lithotripsy   Lymphadenopathy    Morbid obesity (HCC)    OSA (obstructive sleep apnea)    CPAP-17.5 cm water pressure   Paroxysmal atrial fibrillation (HCC)    Pneumonia    S/P emergency tracheotomy for assistance in breathing ([place])    at age 31   Testosterone deficiency    Vitamin D deficiency     Past Surgical History:  Procedure Laterality Date   CHOLECYSTECTOMY     CYSTOSCOPY     retrograde and double J catheter insertion.   FINGER SURGERY     ulnar digital nerve and artery right index finger   GASTRIC BYPASS  2005   600 lbs prior to surgery   TONSILLECTOMY      Family History  Problem Relation Age of Onset   Cancer Mother        lung, heavy smoker   Hypertension Mother    Hyperlipidemia Mother    Cancer Father        melanoma   Aneurysm Father        cardiac   Hyperlipidemia Father    Hypertension Father    Hyperlipidemia Sister    Hypertension Sister    Hyperlipidemia Brother    Hypertension Brother    Stroke Other     Social History   Socioeconomic History   Marital status:  Married    Spouse name: Not on file   Number of children: Not on file   Years of education: Not on file   Highest education level: Not on file  Occupational History   Not on file  Tobacco Use   Smoking status: Never   Smokeless tobacco: Never  Substance and Sexual Activity   Alcohol use: No   Drug use: No    Comment: prior cocaine abuse (heavy) in his 30s.  denies subsequent use   Sexual activity: Yes    Partners: Female    Birth control/protection: None    Comment: on full disability, separated, no regular exercise,walks some, drinks pot of coffee a day.  Other Topics Concern   Not on file  Social History Narrative   Drinks one pot of coffee daily.   Regular exercise-no, walks some      Lives in [place] with roommate.   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   Social Determinants of Health   Financial Resource Strain: Not on file  Food  Insecurity: Not on file  Transportation Needs: Not on file  Physical Activity: Not on file  Stress: Not on file  Social Connections: Not on file  Intimate Partner Violence: Not on file    Outpatient Medications Prior to Visit  Medication Sig Dispense Refill   acetaminophen (TYLENOL) 650 MG CR tablet Take 1 tablet (650 mg total) by mouth every 8 (eight) hours as needed for pain. 90 tablet 3   alfuzosin (UROXATRAL) 10 MG 24 hr tablet Take 10 mg by mouth daily.  10   AMBULATORY NON FORMULARY MEDICATION Medication Name: CPAP machine with nasal mask and supplies set to 17.5 cm water pressure.  No humidifier.  Sent to [facility].  See sleep study from 2009 attached.  Dx: OSA. 1 Units PRN.   AMBULATORY NON FORMULARY MEDICATION Medication Name: Cyclobenzaprine 2% gabapentin 3% Baclofen 2%  Diclofenac 3% Qty# 120 ml Sig: apply small amount topically to effected area 2-3 times a day as directed Fax to [phone] 120 mL 0   amiodarone (PACERONE) 200 MG tablet take 1 tablet(s) by mouth daily 90 tablet 3    amphetamine-dextroamphetamine (ADDERALL) 30 MG tablet Take 1 tablet by mouth 2 (two) times daily. 60 tablet 0   amphetamine-dextroamphetamine (ADDERALL) 30 MG tablet Take 1 tablet by mouth 2 (two) times daily. 60 tablet 0   aspirin EC 81 MG tablet Take 1 tablet (81 mg total) by mouth daily. 90 tablet 3   blood glucose meter kit and supplies KIT Dispense based on patient and insurance preference. Use up to four times daily as directed. (FOR ICD-9 250.00, 250.01). 1 each 0   buPROPion (WELLBUTRIN XL) 150 MG 24 hr tablet Take 1 tablet (150 mg total) by mouth daily. Take with the $Remove'300mg'bHaDNwW$ . 30 tablet 1   buPROPion (WELLBUTRIN XL) 300 MG 24 hr tablet Take 1 tablet (300 mg total) by mouth daily. 30 tablet 1   diltiazem (CARDIZEM CD) 240 MG 24 hr capsule Take 1 capsule (240 mg total) by mouth daily. 30 capsule 5   EASY COMFORT PEN NEEDLES 31G X 8 MM MISC use once a day with victoza     EASY PLUS II GLUCOSE TEST test strip use to test blood sugar once daily 100 each 11   escitalopram (LEXAPRO) 20 MG tablet Take twice a day 60 tablet 1   gabapentin (NEURONTIN) 300 MG capsule Take 300 mg by mouth at bedtime.     Hylan (SYNVISC) 16 MG/2ML SOSY Inject 1 syringe into each knee weekly x3, diagnosis: Bilateral knee osteoarthritis, need 6 total syringes. 12 mL 1   indomethacin (INDOCIN) 50 MG capsule 1 capsule p.o. twice daily as needed for pain 60 capsule 11   lisinopril (ZESTRIL) 10 MG tablet Take 1 tablet (10 mg total) by mouth daily. Labs for further refills 90 tablet 1   NARCAN 4 MG/0.1ML LIQD nasal spray kit Inject solution in to one nostril for suspected overdose. Repeat in 3-5 minutes if necessary in the other nostril. CALL 911  0   oxyCODONE (ROXICODONE) 15 MG immediate release tablet Take 1 tablet (15 mg total) by mouth every 4 (four) hours as needed for pain. 12 tablet 0   pravastatin (PRAVACHOL) 40 MG tablet Take 1 tablet (40 mg total) by mouth daily. 90 tablet 3   Testosterone 10 MG/ACT (2%) GEL Place 3  Pump onto the skin daily. 120 g 3   tiZANidine (ZANAFLEX) 4 MG tablet Take 4 mg by mouth once.      VICTOZA 18  MG/3ML SOPN Inject 0.3 mLs (1.8 mg total) into the skin daily. (54/1.8=30) 9 mL 0   zolpidem (AMBIEN CR) 12.5 MG CR tablet Take 1 tablet (12.5 mg total) by mouth at bedtime. 90 tablet 1   allopurinol (ZYLOPRIM) 100 MG tablet Take 1 tablet (100 mg total) by mouth daily. 90 tablet 3   No facility-administered medications prior to visit.    Allergies  Allergen Reactions   Colchicine Shortness Of Breath, Swelling and Itching   Prunus Persica Swelling    "Peaches" " swelling of face"   Peach Flavor     "Peaches" " swelling of face"    Review of Systems All review of systems negative except what is listed in the HPI     Objective:    Physical Exam Constitutional:      General: He is not in acute distress.    Appearance: He is well-developed. He is obese.  HENT:     Head: Normocephalic and atraumatic.  Cardiovascular:     Rate and Rhythm: Normal rate. Rhythm irregular.  Pulmonary:     Effort: Pulmonary effort is normal.     Breath sounds: Normal breath sounds.  Abdominal:     General: Bowel sounds are normal.     Palpations: Abdomen is soft.  Musculoskeletal:     Cervical back: Normal range of motion and neck supple.  Skin:    General: Skin is warm and dry.  Neurological:     Mental Status: He is alert and oriented to person, place, and time.  Psychiatric:        Mood and Affect: Mood normal.        Behavior: Behavior normal.    BP (!) 85/51 (BP Location: Left Arm, Patient Position: Sitting, Cuff Size: Large)   Pulse 61   Temp 98.3 F (36.8 C) (Oral)   Wt (!) 353 lb (160.1 kg)   SpO2 96%   BMI 51.38 kg/m  Wt Readings from Last 3 Encounters:  01/09/21 (!) 353 lb (160.1 kg)  12/05/20 (!) 353 lb (160.1 kg)  10/23/20 (!) 334 lb 1.9 oz (151.6 kg)    Health Maintenance Due  Topic Date Due   OPHTHALMOLOGY EXAM  06/06/2020   FOOT EXAM  07/27/2020     There are no preventive care reminders to display for this patient.   Lab Results  Component Value Date   TSH 1.72 06/04/2019   Lab Results  Component Value Date   WBC 8.3 12/05/2020   HGB 13.8 12/05/2020   HCT 42.1 12/05/2020   MCV 87.9 12/05/2020   PLT 220 12/05/2020   Lab Results  Component Value Date   NA 139 12/05/2020   K 5.2 12/05/2020   CO2 29 12/05/2020   GLUCOSE 96 12/05/2020   BUN 25 12/05/2020   CREATININE 1.48 (H) 12/05/2020   BILITOT 0.6 06/04/2019   ALKPHOS 87 04/24/2017   AST 21 06/04/2019   ALT 17 06/04/2019   PROT 6.6 06/04/2019   ALBUMIN 4.1 04/22/2017   CALCIUM 9.5 12/05/2020   EGFR 53 (L) 12/05/2020   Lab Results  Component Value Date   CHOL 145 06/04/2019   Lab Results  Component Value Date   HDL 41 06/04/2019   Lab Results  Component Value Date   LDLCALC 84 06/04/2019   Lab Results  Component Value Date   TRIG 104 06/04/2019   Lab Results  Component Value Date   CHOLHDL 3.5 06/04/2019   Lab Results  Component Value Date  HGBA1C 6.2 (A) 12/05/2020       Assessment & Plan:   1. UTI symptoms 2. Cystitis UA positive for leuks and nitrites. Will go ahead and send in [place]. Urine microscopic and culture sent out - will let patient know if we need to make any changes. Push fluids (hypotensive). Strict ED precautions.   - POCT URINALYSIS DIP (CLINITEK) - Urinalysis, Routine w reflex microscopic - Urine Culture - nitrofurantoin, macrocrystal-monohydrate, (MACROBID) 100 MG capsule; Take 1 capsule (100 mg total) by mouth 2 (two) times daily.  Dispense: 10 capsule; Refill: 0   3. Dizziness 4. SOB (shortness of breath) 5. Atrial Fibrillation EKG revealed Afib 73, left axis deviation - history of PAF, but last ekg in July was NSR. Currently on amio and cardizem daily. Not anticoagulated d/t history of significant bleeding. States he can usually tell when he is in Afib because he always goes into RVR. Given that he is so  symptomatic (hypotensive, dizzy/lightheaded, fell at home) recommend he go to the ED - patient refused. Agrees to monitor symptoms closely and go to the ED if not improving or if symptoms begin to worsen. Recommend he hold lisinopril until BP improves. Stay well hydrated.  - EKG 12-Lead - Rhythm ECG, report  Follow-up if symptoms worsen or fail to improve.    Terrilyn Saver, NP

## 2021-01-09 NOTE — Telephone Encounter (Signed)
Medication: Hylan (SYNVISC) 16 MG/2ML SOSY Prior authorization submitted via CoverMyMeds on 01/09/2021 PA submission pending   Pt stopped by Christal's desk and told her that he is supposed to be getting Synvisc injections in his knees again and wanted to know about the PA that needed to be done. Pt informed that we will start the process today.

## 2021-01-12 LAB — URINALYSIS, ROUTINE W REFLEX MICROSCOPIC
Bilirubin Urine: NEGATIVE
Glucose, UA: NEGATIVE
Ketones, ur: NEGATIVE
Nitrite: POSITIVE — AB
RBC / HPF: NONE SEEN /HPF (ref 0–2)
Specific Gravity, Urine: 1.015 (ref 1.001–1.035)
Squamous Epithelial / HPF: NONE SEEN /HPF (ref <=5)
WBC, UA: >=60 /HPF — AB (ref 0–5)
pH: 5.5 (ref 5.0–8.0)

## 2021-01-12 LAB — URINE CULTURE
MICRO NUMBER:: 12462093
SPECIMEN QUALITY:: ADEQUATE

## 2021-01-12 LAB — MICROSCOPIC MESSAGE

## 2021-01-12 NOTE — Addendum Note (Signed)
Addended by: Delfino Lovett on: 01/12/2021 02:23 PM   Modules accepted: Orders

## 2021-01-12 NOTE — Telephone Encounter (Signed)
Left patient a voicemail to call us back.

## 2021-01-16 ENCOUNTER — Ambulatory Visit (HOSPITAL_COMMUNITY): Payer: 59 | Admitting: Licensed Clinical Social Worker

## 2021-01-23 ENCOUNTER — Ambulatory Visit (HOSPITAL_COMMUNITY): Payer: 59 | Admitting: Licensed Clinical Social Worker

## 2021-01-30 ENCOUNTER — Ambulatory Visit (HOSPITAL_COMMUNITY): Payer: 59 | Admitting: Licensed Clinical Social Worker

## 2021-01-31 NOTE — Telephone Encounter (Signed)
Benefits Investigation Details received from [employer].  Medical: Deductible does not apply. Since the OOP has been met, patient is covered at 100%. PA is required for the drub through [place] [facility] [place].  [place] faxed[place] [phone].  Fax confirmation received  Pharmacy: The product is not covered under the pharmacy plan Specialty Pharmacy: [facility]  May fill through: [facility] and [place] [facility] Copay/Coinsurance: $8 Product Copay: $0 Administration Coinsurance: $0 Administration Copay: $0 Deductible: $700 (met: $700) Out of Pocket Max: $0 (met: $0)

## 2021-02-01 ENCOUNTER — Ambulatory Visit (HOSPITAL_COMMUNITY): Payer: 59 | Admitting: Licensed Clinical Social Worker

## 2021-02-01 NOTE — Telephone Encounter (Signed)
Notification received from [facility] stating the pt does not have active insurance with them. Pt now has [employer]. New enrollment form sent to [employer] to get the Benefits Investigation Details.

## 2021-02-02 ENCOUNTER — Other Ambulatory Visit: Payer: Self-pay | Admitting: Family Medicine

## 2021-02-06 ENCOUNTER — Ambulatory Visit (HOSPITAL_COMMUNITY): Payer: 59 | Admitting: Licensed Clinical Social Worker

## 2021-02-07 NOTE — Telephone Encounter (Signed)
Benefits Investigation Details received from [employer] Injection: Orthovisc  Medical: Deductible applies. Since the deductible has been met, patient is responsible for a coinsurance and a copay. Once the OOP has been met, patient is covered at 100%. Only one copay applies per date of service. PA is required for the drug through [place].  PA form faxed: [employer] at [phone].  Fax confirmation received  Pharmacy: The product is not covered under the pharmacy plan  Specialty Pharmacy: [facility] fill through: Specialty Pharmacy [facility] Copay/Coinsurance: non Product Copay: 25% Administration Coinsurance: none Administration Copay: $10 Deductible: $200 (met: $200) Out of Pocket Max: $1000 (met: $487.26)

## 2021-02-08 NOTE — Telephone Encounter (Signed)
Specific PA form requested by [employer]. Form completed and faxed to [employer] at [phone]. Fax confirmation received.   [employer] PA# [phone]

## 2021-02-13 ENCOUNTER — Ambulatory Visit (INDEPENDENT_AMBULATORY_CARE_PROVIDER_SITE_OTHER): Payer: 59 | Admitting: Licensed Clinical Social Worker

## 2021-02-13 DIAGNOSIS — F411 Generalized anxiety disorder: Secondary | ICD-10-CM | POA: Diagnosis not present

## 2021-02-13 DIAGNOSIS — R69 Illness, unspecified: Secondary | ICD-10-CM | POA: Diagnosis not present

## 2021-02-13 NOTE — Progress Notes (Signed)
   THERAPIST PROGRESS NOTE  Session Time: 10:00 am-10:45 am  Type of Therapy: Individual Therapy  Purpose of session: [name] will manage mood and anxiety as evidenced by challenging self defeating thoughts, increase motivation, and reduce unhealthy coping skills for 5 out of 7 days for 60 days.   Interventions: Therapist utilized CBT and Solution Focused brief therapy to address mood and anxiety. Therapist provided support and empathy to patient during session. Therapist explored his relationship dynamics with his girlfriend and his goal for the relationship.   Effectiveness: Patient was oriented x5 (person, place, situation, time, and object). Patient was alert, engaged, pleasant, and cooperative. Patient was casually dressed, and appropriately groomed. Patient returned recently from helping his girlfriend pack up to move. Patient moved quilt/fabrics that she didn't want the movers to touch. Patient spent the majority of his time moving this for her. He ended up hurting his back and spiking a fever. He had to come home early and was in bed for a week. Patient had a conversation with her before he left about their relationship. His girlfriend is moving down to [place] and he wants to take the relationship to the next level and live together. Patient said that his girlfriend was hesitant/unsure about this even though they have discussed this several times. He told her that if she didn't want to take that step then he wasn't sure if he wanted to continue the relationship. She will be moving down in a week or so and wants to have a relationship to with her for the rest of their life.   Patient engaged in session. He responded well to interventions. Patient continues to meet criteria for Generalized Anxiety Disorder and Major Depressive disorder, recurrent episode, moderate. Patient will continue in outpatient therapy due to being the least restrictive service to meet his needs. Patient made minimal progress on  his goals.   Suicidal/Homicidal: NAwithout intent/plan  Plan: Return again in 1-2 weeks.  Diagnosis: Axis I: Generalized Anxiety Disorder    Axis II: No diagnosis    Bynum Bellows, LCSW 02/13/2021

## 2021-02-15 ENCOUNTER — Ambulatory Visit (INDEPENDENT_AMBULATORY_CARE_PROVIDER_SITE_OTHER): Payer: 59 | Admitting: Licensed Clinical Social Worker

## 2021-02-15 DIAGNOSIS — F411 Generalized anxiety disorder: Secondary | ICD-10-CM | POA: Diagnosis not present

## 2021-02-15 DIAGNOSIS — R69 Illness, unspecified: Secondary | ICD-10-CM | POA: Diagnosis not present

## 2021-02-15 NOTE — Progress Notes (Signed)
   THERAPIST PROGRESS NOTE  Session Time: 10:00 am-10:45 am  Type of Therapy: Individual Therapy  Purpose of session: [name] will manage mood and anxiety as evidenced by challenging self defeating thoughts, increase motivation, and reduce unhealthy coping skills for 5 out of 7 days for 60 days.   Interventions: Therapist utilized CBT and Solution Focused brief therapy to address mood and anxiety. Therapist provided support and empathy to patient. Therapist discussed with patient uncertainty in his life and how he can manage that uncertainty.    Effectiveness: Patient was oriented x5 (person, place, situation, time, and object). Patient was alert, engaged, pleasant, and cooperative. Patient was casually dressed, and appropriately groomed. Patient feels uncertainty in his relationship. He knows what he wants but has not gotten a clear answer from his girlfriend. He feels like she may have someone else, just not be interested in sex, or possibly gay. She doesn't express her love for him verbally or physically. Patient has uncertainty in his professional life. 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 Patient understood that change in his physical, values, relationship, and mood can impact the other areas of his life.   Patient engaged in session. He responded well to interventions. Patient continues to meet criteria for Generalized Anxiety Disorder and Major Depressive disorder, recurrent episode, moderate. Patient will continue in outpatient therapy due to being the least restrictive service to meet his needs. Patient made minimal progress on his goals.   Suicidal/Homicidal: NAwithout intent/plan  Plan: Return again in 1-2 weeks.  Diagnosis: Axis I: Generalized Anxiety Disorder    Axis II: No diagnosis    Bynum Bellows, LCSW 02/15/2021

## 2021-02-20 ENCOUNTER — Ambulatory Visit (INDEPENDENT_AMBULATORY_CARE_PROVIDER_SITE_OTHER): Payer: 59 | Admitting: Licensed Clinical Social Worker

## 2021-02-20 ENCOUNTER — Other Ambulatory Visit: Payer: Self-pay

## 2021-02-20 DIAGNOSIS — R69 Illness, unspecified: Secondary | ICD-10-CM | POA: Diagnosis not present

## 2021-02-20 DIAGNOSIS — F331 Major depressive disorder, recurrent, moderate: Secondary | ICD-10-CM | POA: Diagnosis not present

## 2021-02-20 NOTE — Progress Notes (Signed)
   THERAPIST PROGRESS NOTE  Session Time: 9:00 am-9:45 am  Type of Therapy: Individual Therapy  Purpose of session: [name] will manage mood and anxiety as evidenced by challenging self defeating thoughts, increase motivation, and reduce unhealthy coping skills for 5 out of 7 days for 60 days.   Interventions: Therapist utilized CBT and Solution Focused brief therapy to address mood and anxiety. Therapist provided support and empathy to patient during session. Therapist processed patient's feelings to identify triggers for mood. Therapist worked with patient to identify the root of his triggers.    Effectiveness: Patient was oriented x5 (person, place, situation, time, and object). Patient was alert, engaged, pleasant, and cooperative. Patient was casually dressed, and appropriately groomed. Patient has felt down and frustrated. He feels like he has had a lot of failure over his life due to not completing or following through on things. Patient had a lot of successes in his younger years (20's) but it all changed after his mother passed and he experienced a major depressive episode. Patient used food, sex, drugs, etc during that time to try to feel something besides depressed. Patient pursues pleasure over "pain" (boredom, work, Catering manager). Patient knows he needs to work but can't bring himself to apply. 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   Patient engaged in session. He responded well to interventions. Patient continues to meet criteria for Generalized Anxiety Disorder and Major Depressive disorder, recurrent episode, moderate. Patient will continue in outpatient therapy due to being the least restrictive service to meet his needs. Patient made minimal progress on his goals.   Suicidal/Homicidal: NAwithout intent/plan  Plan: Return again in 1-2 weeks.  Diagnosis: Axis I: Generalized Anxiety Disorder    Axis II: No  diagnosis    Bynum Bellows, LCSW 02/20/2021

## 2021-02-21 ENCOUNTER — Other Ambulatory Visit: Payer: Self-pay

## 2021-02-21 MED ORDER — INDOMETHACIN 50 MG PO CAPS: ORAL_CAPSULE | ORAL | 11 refills | Status: AC

## 2021-02-27 ENCOUNTER — Ambulatory Visit (HOSPITAL_COMMUNITY): Payer: 59 | Admitting: Licensed Clinical Social Worker

## 2021-03-06 ENCOUNTER — Ambulatory Visit (INDEPENDENT_AMBULATORY_CARE_PROVIDER_SITE_OTHER): Payer: 59 | Admitting: Licensed Clinical Social Worker

## 2021-03-06 DIAGNOSIS — R69 Illness, unspecified: Secondary | ICD-10-CM | POA: Diagnosis not present

## 2021-03-06 DIAGNOSIS — F331 Major depressive disorder, recurrent, moderate: Secondary | ICD-10-CM | POA: Diagnosis not present

## 2021-03-06 NOTE — Progress Notes (Signed)
   THERAPIST PROGRESS NOTE  Session Time: 11:00 am-11:45 am  Type of Therapy: Individual Therapy  Purpose of session: [name] will manage mood and anxiety as evidenced by challenging self defeating thoughts, increase motivation, and reduce unhealthy coping skills for 5 out of 7 days for 60 days.   Interventions: Therapist utilized CBT and Solution Focused brief therapy to address mood and anxiety. Therapist provided support and empathy to patient. Therapist explored patient's escapism and avoidance. Therapist assisted patient in identifying the meaning or root of his triggers.   Effectiveness: Patient was oriented x5 (person, place, situation, time, and object). Patient was casually dressed, and appropriately groomed. Patient was alert, engaged, pleasant, and cooperative. Patient was feels down and to the point he knows he needs to make changes but doesn't want to file for disability and doesn't want to work in [employer]. Patient feels like he doesn't bring much to the table since he is not working and living with hi sister. Patient admitted that he avoids discomfort such as working a job he doesn't want to, etc. Patient avoid and escapes but also feels down because of this kind of behavior. 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   Patient engaged in session. He responded well to interventions. Patient continues to meet criteria for Generalized Anxiety Disorder and Major Depressive disorder, recurrent episode, moderate. Patient will continue in outpatient therapy due to being the least restrictive service to meet his needs. Patient made minimal progress on his goals.   Suicidal/Homicidal: NAwithout intent/plan  Plan: Return again in 1-2 weeks.  Diagnosis: Axis I: Generalized Anxiety Disorder    Axis II: No diagnosis    Bynum Bellows, LCSW 03/06/2021

## 2021-03-07 ENCOUNTER — Other Ambulatory Visit: Payer: Self-pay | Admitting: Sports Medicine

## 2021-03-07 NOTE — Telephone Encounter (Signed)
To PCP

## 2021-03-07 NOTE — Telephone Encounter (Signed)
Last fill: 02/02/21 Last OV: 11/23/20

## 2021-03-08 ENCOUNTER — Ambulatory Visit (INDEPENDENT_AMBULATORY_CARE_PROVIDER_SITE_OTHER): Payer: 59 | Admitting: Licensed Clinical Social Worker

## 2021-03-08 ENCOUNTER — Other Ambulatory Visit: Payer: Self-pay

## 2021-03-08 DIAGNOSIS — F331 Major depressive disorder, recurrent, moderate: Secondary | ICD-10-CM | POA: Diagnosis not present

## 2021-03-08 DIAGNOSIS — F411 Generalized anxiety disorder: Secondary | ICD-10-CM | POA: Diagnosis not present

## 2021-03-08 DIAGNOSIS — R69 Illness, unspecified: Secondary | ICD-10-CM | POA: Diagnosis not present

## 2021-03-08 NOTE — Progress Notes (Signed)
   THERAPIST PROGRESS NOTE  Session Time: 11:00 am-11:45 am  Type of Therapy: Individual Therapy  Purpose of session: [name] will manage mood and anxiety as evidenced by challenging self defeating thoughts, increase motivation, and reduce unhealthy coping skills for 5 out of 7 days for 60 days.   Interventions: Therapist utilized CBT and Solution Focused brief therapy to address mood and anxiety. Therapist provided support and empathy to patient during session. Therapist explored patient's increased anxiety around his relationship.   Effectiveness: Patient was oriented x5 (person, place, situation, time, and object). Patient was casually dressed, and appropriately groomed. Patient was alert, engaged, pleasant, and cooperative. Patient has noted an increase in anxiety. Patient feels like he is not going to measure up to his girlfriend. Patient is not working and feels like some of his unhealthy coping skills are a concern to her. Patient wonders if his girlfriend will want to be with him. Patient has times where he doesn't hear from her and wonders how she can go 24-36 hours without talking. He cares for her and wants to talk with her even if it is a Art gallery manager. When he doesn't hear from her he questions if she cares. He recognizes it is him and his own thinking causing this. 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   Patient engaged in session. He responded well to interventions. Patient continues to meet criteria for Generalized Anxiety Disorder and Major Depressive disorder, recurrent episode, moderate. Patient will continue in outpatient therapy due to being the least restrictive service to meet his needs. Patient made minimal progress on his goals.   Suicidal/Homicidal: NAwithout intent/plan  Plan: Return again in 1-2 weeks.  Diagnosis: Axis I: Generalized Anxiety Disorder    Axis II: No diagnosis    Bynum Bellows,  LCSW 03/08/2021

## 2021-03-14 NOTE — Telephone Encounter (Signed)
[employer] PA form re-faxed as "Urgent/Expedited" Fax confirmation received.

## 2021-03-21 ENCOUNTER — Ambulatory Visit (INDEPENDENT_AMBULATORY_CARE_PROVIDER_SITE_OTHER): Payer: 59 | Admitting: Licensed Clinical Social Worker

## 2021-03-21 DIAGNOSIS — F331 Major depressive disorder, recurrent, moderate: Secondary | ICD-10-CM

## 2021-03-21 DIAGNOSIS — R69 Illness, unspecified: Secondary | ICD-10-CM | POA: Diagnosis not present

## 2021-03-21 MED ORDER — BUPROPION HCL ER (XL) 150 MG PO TB24: 150.0000 mg | ORAL_TABLET | Freq: Every day | ORAL | 1 refills | Status: DC

## 2021-03-21 MED ORDER — BUPROPION HCL ER (XL) 300 MG PO TB24: 300.0000 mg | ORAL_TABLET | Freq: Every day | ORAL | 1 refills | Status: DC

## 2021-03-21 MED ORDER — ESCITALOPRAM OXALATE 20 MG PO TABS: ORAL_TABLET | ORAL | 1 refills | Status: DC

## 2021-03-22 NOTE — Progress Notes (Signed)
° °  THERAPIST PROGRESS NOTE  Session Time: 11:00 am-11:45 am  Type of Therapy: Individual Therapy  Purpose of session: Rodney Bright will manage mood and anxiety as evidenced by challenging self defeating thoughts, increase motivation, and reduce unhealthy coping skills for 5 out of 7 days for 60 days.   Interventions: Therapist utilized CBT and Solution Focused brief therapy to address mood and anxiety. Therapist provided support and empathy to patient during session. Therapist explored patient mood. Therapist discussed with patient what lead him to change his perception of being on disability.   Effectiveness: Patient was oriented x5 (person, place, situation, time, and object). Patient was casually dressed, and appropriately groomed. Patient was alert, engaged, pleasant, and cooperative. Patient was in a somewhat better mood. He has been doing a lot to prepare for his girlfriend moving to [facility]. He has been trying to help her set up internet, power, and unload some of the items from her moving [place] while she is still in another state. Patient has decided to pursue disability. He feels like this will be a source of income and will allow him to focus on finding a low key job. Patient wants to be able to provide for himself and live up to his girlfriends expectations. He is not sure of those expectations but feels like working will be part of those expectations.   Patient engaged in session. He responded well to interventions. Patient continues to meet criteria for Generalized Anxiety Disorder and Major Depressive disorder, recurrent episode, moderate. Patient will continue in outpatient therapy due to being the least restrictive service to meet his needs. Patient made minimal progress on his goals.   Suicidal/Homicidal: NAwithout intent/plan  Plan: Return again in 1-2 weeks.  Diagnosis: Axis I: Generalized Anxiety Disorder    Axis II: No diagnosis    Bynum Bellows, LCSW 03/22/2021

## 2021-04-05 ENCOUNTER — Ambulatory Visit: Payer: 59 | Admitting: Family Medicine

## 2021-04-13 ENCOUNTER — Other Ambulatory Visit: Payer: Self-pay | Admitting: Cardiology

## 2021-04-16 ENCOUNTER — Telehealth (HOSPITAL_COMMUNITY): Payer: 59 | Admitting: Psychiatry

## 2021-04-23 ENCOUNTER — Ambulatory Visit (HOSPITAL_COMMUNITY): Payer: 59 | Admitting: Licensed Clinical Social Worker

## 2021-04-26 NOTE — Telephone Encounter (Signed)
Pt now has new insurance. Will need to get a copy of his new card to proceed with PA for Synvisc. Pt states it is still with [employer], but the info is different. I told him that he could just stop by the office and have someone at the front desk scan his new card into his chart.

## 2021-04-30 ENCOUNTER — Encounter: Payer: 59 | Admitting: Family Medicine

## 2021-04-30 ENCOUNTER — Ambulatory Visit (HOSPITAL_COMMUNITY): Payer: 59 | Admitting: Licensed Clinical Social Worker

## 2021-05-07 ENCOUNTER — Ambulatory Visit (HOSPITAL_COMMUNITY): Payer: 59 | Admitting: Licensed Clinical Social Worker

## 2021-05-09 ENCOUNTER — Encounter (HOSPITAL_COMMUNITY): Payer: Self-pay | Admitting: Licensed Clinical Social Worker

## 2021-05-11 ENCOUNTER — Other Ambulatory Visit: Payer: Self-pay | Admitting: Family Medicine

## 2021-05-11 DIAGNOSIS — G4709 Other insomnia: Secondary | ICD-10-CM

## 2021-05-11 MED ORDER — ZOLPIDEM TARTRATE ER 12.5 MG PO TBCR: 12.5000 mg | EXTENDED_RELEASE_TABLET | Freq: Every day | ORAL | 0 refills | Status: DC

## 2021-05-11 NOTE — Progress Notes (Signed)
Meds ordered this encounter  Medications   zolpidem (AMBIEN CR) 12.5 MG CR tablet    Sig: Take 1 tablet (12.5 mg total) by mouth at bedtime.    Dispense:  30 tablet    Refill:  0    Needs follow up appt.

## 2021-05-14 ENCOUNTER — Ambulatory Visit (HOSPITAL_COMMUNITY): Payer: 59 | Admitting: Licensed Clinical Social Worker

## 2021-05-16 ENCOUNTER — Ambulatory Visit: Payer: 59 | Admitting: Sports Medicine

## 2021-05-21 ENCOUNTER — Ambulatory Visit (HOSPITAL_COMMUNITY): Payer: 59 | Admitting: Licensed Clinical Social Worker

## 2021-05-21 ENCOUNTER — Ambulatory Visit: Payer: 59 | Admitting: Sports Medicine

## 2021-05-28 ENCOUNTER — Ambulatory Visit (HOSPITAL_COMMUNITY): Payer: 59 | Admitting: Licensed Clinical Social Worker

## 2021-05-28 ENCOUNTER — Other Ambulatory Visit: Payer: Self-pay | Admitting: Family Medicine

## 2021-05-28 ENCOUNTER — Encounter: Payer: 59 | Admitting: Family Medicine

## 2021-06-04 ENCOUNTER — Ambulatory Visit (HOSPITAL_COMMUNITY): Payer: 59 | Admitting: Licensed Clinical Social Worker

## 2021-06-13 ENCOUNTER — Other Ambulatory Visit: Payer: Self-pay | Admitting: Family Medicine

## 2021-06-18 ENCOUNTER — Other Ambulatory Visit: Payer: Self-pay | Admitting: *Deleted

## 2021-06-18 ENCOUNTER — Other Ambulatory Visit (HOSPITAL_COMMUNITY): Payer: Self-pay

## 2021-06-18 DIAGNOSIS — F331 Major depressive disorder, recurrent, moderate: Secondary | ICD-10-CM

## 2021-06-18 MED ORDER — BUPROPION HCL ER (XL) 300 MG PO TB24: 300.0000 mg | ORAL_TABLET | Freq: Every day | ORAL | 0 refills | Status: DC

## 2021-06-18 MED ORDER — ESCITALOPRAM OXALATE 20 MG PO TABS: ORAL_TABLET | ORAL | 0 refills | Status: DC

## 2021-06-18 MED ORDER — PRAVASTATIN SODIUM 40 MG PO TABS: 40.0000 mg | ORAL_TABLET | Freq: Every day | ORAL | 3 refills | Status: AC

## 2021-06-18 MED ORDER — DILTIAZEM HCL ER COATED BEADS 240 MG PO CP24: 240.0000 mg | ORAL_CAPSULE | Freq: Every day | ORAL | 3 refills | Status: AC

## 2021-06-18 MED ORDER — AMIODARONE HCL 200 MG PO TABS
ORAL_TABLET | ORAL | 3 refills | Status: AC
Start: 2021-06-18 — End: ?

## 2021-06-18 MED ORDER — BUPROPION HCL ER (XL) 150 MG PO TB24
150.0000 mg | ORAL_TABLET | Freq: Every day | ORAL | 0 refills | Status: DC
Start: 2021-06-18 — End: 2021-07-20

## 2021-06-18 MED ORDER — LISINOPRIL 10 MG PO TABS: 10.0000 mg | ORAL_TABLET | Freq: Every day | ORAL | 1 refills | Status: AC

## 2021-06-25 ENCOUNTER — Telehealth (HOSPITAL_COMMUNITY): Payer: 59 | Admitting: Psychiatry

## 2021-06-27 ENCOUNTER — Telehealth (HOSPITAL_COMMUNITY): Payer: Self-pay | Admitting: Psychiatry

## 2021-06-27 NOTE — Telephone Encounter (Signed)
Called the pt & left a vm for him  ? ? - he left a vm saying he wanted someone to call him ?

## 2021-07-04 ENCOUNTER — Other Ambulatory Visit: Payer: Self-pay | Admitting: Family Medicine

## 2021-07-04 DIAGNOSIS — G4709 Other insomnia: Secondary | ICD-10-CM

## 2021-07-09 ENCOUNTER — Telehealth (HOSPITAL_COMMUNITY): Payer: Self-pay | Admitting: Psychiatry

## 2021-07-09 NOTE — Telephone Encounter (Signed)
Pt left vm he wanted to set up an appt. ? ?I called him back to set up appt left vm ? # [phone] ?

## 2021-07-17 ENCOUNTER — Other Ambulatory Visit: Payer: Self-pay | Admitting: Family Medicine

## 2021-07-17 DIAGNOSIS — G4709 Other insomnia: Secondary | ICD-10-CM

## 2021-07-17 NOTE — Telephone Encounter (Signed)
Please call pt and inform him that he is OVERDUE for an office visit and will need an appointment for refills on this medication and others that are coming up for refills.  ?

## 2021-07-18 MED ORDER — ZOLPIDEM TARTRATE ER 12.5 MG PO TBCR: 12.5000 mg | EXTENDED_RELEASE_TABLET | Freq: Every day | ORAL | 0 refills | Status: DC

## 2021-07-18 NOTE — Telephone Encounter (Signed)
Meds ordered this encounter  ?Medications  ? zolpidem (AMBIEN CR) 12.5 MG CR tablet  ?  Sig: Take 1 tablet (12.5 mg total) by mouth at bedtime.  ?  Dispense:  20 tablet  ?  Refill:  0  ?  Needs follow up appt.  ? ? ?

## 2021-07-18 NOTE — Telephone Encounter (Signed)
Patient stopped by office to schedule appt with next available with PCP on 08/07/2021 And needed medication refilled up until appt date. Patient possibly wanted a call regarding this as he needs his medication that he has not slept in days. I let patient know all I could do was send the message that he is overdue for a f.u. AM ?

## 2021-07-18 NOTE — Telephone Encounter (Signed)
LVM letting patient know he is OVERDUE for an appt & will need to scheduled & keep appt for further refills. AM ?

## 2021-07-19 ENCOUNTER — Other Ambulatory Visit (HOSPITAL_COMMUNITY): Payer: Self-pay | Admitting: Psychiatry

## 2021-07-20 ENCOUNTER — Encounter (HOSPITAL_COMMUNITY): Payer: Self-pay | Admitting: Psychiatry

## 2021-07-20 ENCOUNTER — Telehealth (INDEPENDENT_AMBULATORY_CARE_PROVIDER_SITE_OTHER): Payer: 59 | Admitting: Psychiatry

## 2021-07-20 DIAGNOSIS — F331 Major depressive disorder, recurrent, moderate: Secondary | ICD-10-CM | POA: Diagnosis not present

## 2021-07-20 DIAGNOSIS — F411 Generalized anxiety disorder: Secondary | ICD-10-CM | POA: Diagnosis not present

## 2021-07-20 DIAGNOSIS — R69 Illness, unspecified: Secondary | ICD-10-CM | POA: Diagnosis not present

## 2021-07-20 DIAGNOSIS — F5102 Adjustment insomnia: Secondary | ICD-10-CM | POA: Diagnosis not present

## 2021-07-20 MED ORDER — ESCITALOPRAM OXALATE 20 MG PO TABS: ORAL_TABLET | ORAL | 4 refills | Status: AC

## 2021-07-20 MED ORDER — BUPROPION HCL ER (XL) 150 MG PO TB24: 150.0000 mg | ORAL_TABLET | Freq: Every day | ORAL | 5 refills | Status: AC

## 2021-07-20 MED ORDER — BUPROPION HCL ER (XL) 300 MG PO TB24: 300.0000 mg | ORAL_TABLET | Freq: Every day | ORAL | 5 refills | Status: AC

## 2021-07-20 NOTE — Progress Notes (Signed)
Patient ID: Rodney Bright, male   DOB: May 08, 1960, 61 y.o.   MRN: [id number] ? ? ?[place] ?Follow-up tele medicine appointment ? ?Rodney Bright ?05-04-1960 ? ?Date 07/20/2021 ? ?Virtual Visit via Video Note ? ?I connected with Rodney Bright on 07/20/21 at  1:00 PM EDT by a video enabled telemedicine application and verified that I am speaking with the correct person using two identifiers. ? ?Location: ?Patient: home ?Provider: home office ?  ?I discussed the limitations of evaluation and management by telemedicine and the availability of in person appointments. The patient expressed understanding and agreed to proceed. ? ? ?  ?I discussed the assessment and treatment plan with the patient. The patient was provided an opportunity to ask questions and all were answered. The patient agreed with the plan and demonstrated an understanding of the instructions. ?  ?The patient was advised to call back or seek an in-person evaluation if the symptoms worsen or if the condition fails to improve as anticipated. ? ?I provided 15 minutes of non-face-to-face time during this encounter. ? ? ?HPI Comments: Mr. Postlewaite is a 61 y/o male with a past psychiatric history significant for symptoms of depression and anxiety. The patient was referred for psychiatric services for medication management. ? ?Last seen July 2022, also has missed therapy apppointments ? ?Says got insurance back and wants to reschedule therapy ? ?Overall mood fair, GF moving to live her so keeping him busy findhing home ? ?Follows with PCP for knee conditions and pain ?Sleep poor without ambien, gets from pcp ?Understands the effect of ambien and adderall and concerns ?Says plan to lower adderral from pcp ? ?On pain meds and following with providers,not on ativan  ? ?Uses CPap ? ? ?Discussed various meds causing effects including addreall can lead to anxiety and sleep concerns ? ? ?Insomnia :On cpap machine ?Aggravating factor: medical co  morbidities ?modifying factor: family, GF ? ?Depression severity: manageable ?Motivation: Fair ?Duration adult life ? ? ?Review of Systems  ?Cardiovascular:  Negative for chest pain.  ?Psychiatric/Behavioral:  Negative for depression, hallucinations and suicidal ideas.   ? ? ?There were no vitals filed for this visit. ? ?Physical Exam  ?Constitutional: No distress.  ?Obese  ?Skin: He is not diaphoretic.  ? ? ?Past Medical History: Reviewed  ?Past Medical History:  ?Diagnosis Date  ? Anemia   ? Diabetes mellitus   ? controlled  ? ED (erectile dysfunction)   ? Gout   ? Kidney stones   ? stent, lithotripsy  ? Lymphadenopathy   ? Morbid obesity ([place])   ? OSA (obstructive sleep apnea)   ? CPAP-17.5 cm water pressure  ? Paroxysmal atrial fibrillation (HCC)   ? Pneumonia   ? S/P emergency tracheotomy for assistance in breathing [facility])   ? at age 10  ? Testosterone deficiency   ? Vitamin D deficiency   ? ? ? ?Allergies:  ?Allergies  ?Allergen Reactions  ? Colchicine Shortness Of Breath, Swelling and Itching  ? Prunus Persica Swelling  ?  "Peaches" " swelling of face"  ? Peach Flavor   ?  "Peaches" " swelling of face"  ? ? ? ?Current Medications: ?Current Outpatient Medications on File Prior to Visit  ?Medication Sig Dispense Refill  ? acetaminophen (TYLENOL) 650 MG CR tablet Take 1 tablet (650 mg total) by mouth every 8 (eight) hours as needed for pain. 90 tablet 3  ? alfuzosin (UROXATRAL) 10 MG 24 hr tablet Take 10 mg by mouth daily.  10  ? allopurinol (ZYLOPRIM) 100 MG tablet Take 1 tablet (100 mg total) by mouth daily. 90 tablet 3  ? AMBULATORY NON FORMULARY MEDICATION Medication Name: CPAP machine with nasal mask and supplies set to 17.5 cm water pressure.  No humidifier.  Sent to [facility].  See sleep study from 2009 attached.  Dx: OSA. 1 Units PRN.  ? AMBULATORY NON FORMULARY MEDICATION Medication Name: Cyclobenzaprine 2% gabapentin 3% Baclofen 2%  Diclofenac 3% ?Qty# 120 ml Sig: apply small amount topically to effected  area 2-3 times a day as directed Fax to [phone] 120 mL 0  ? amiodarone (PACERONE) 200 MG tablet take 1 tablet(s) by mouth daily 90 tablet 3  ? amphetamine-dextroamphetamine (ADDERALL) 30 MG tablet Take 1 tablet by mouth 2 (two) times daily. 60 tablet 0  ? amphetamine-dextroamphetamine (ADDERALL) 30 MG tablet Take 1 tablet by mouth 2 (two) times daily. 60 tablet 0  ? aspirin EC 81 MG tablet Take 1 tablet (81 mg total) by mouth daily. 90 tablet 3  ? blood glucose meter kit and supplies KIT Dispense based on patient and insurance preference. Use up to four times daily as directed. (FOR ICD-9 250.00, 250.01). 1 each 0  ? diltiazem (CARDIZEM CD) 240 MG 24 hr capsule Take 1 capsule (240 mg total) by mouth daily. 90 capsule 3  ? EASY COMFORT PEN NEEDLES 31G X 8 MM MISC use once a day with victoza    ? EASY PLUS II GLUCOSE TEST test strip use to test blood sugar once daily 100 each 11  ? gabapentin (NEURONTIN) 300 MG capsule Take 300 mg by mouth at bedtime.    ? Hylan (SYNVISC) 16 MG/2ML SOSY Inject 1 syringe into each knee weekly x3, diagnosis: Bilateral knee osteoarthritis, need 6 total syringes. 12 mL 1  ? indomethacin (INDOCIN) 50 MG capsule 1 capsule p.o. twice daily as needed for pain 60 capsule 11  ? lisinopril (ZESTRIL) 10 MG tablet Take 1 tablet (10 mg total) by mouth daily. Labs for further refills 90 tablet 1  ? NARCAN 4 MG/0.1ML LIQD nasal spray kit Inject solution in to one nostril for suspected overdose. Repeat in 3-5 minutes if necessary in the other nostril. CALL 911  0  ? nitrofurantoin, macrocrystal-monohydrate, (MACROBID) 100 MG capsule Take 1 capsule (100 mg total) by mouth 2 (two) times daily. 10 capsule 0  ? oxyCODONE (ROXICODONE) 15 MG immediate release tablet Take 1 tablet (15 mg total) by mouth every 4 (four) hours as needed for pain. 12 tablet 0  ? pravastatin (PRAVACHOL) 40 MG tablet Take 1 tablet (40 mg total) by mouth daily. 90 tablet 3  ? Testosterone 10 MG/ACT (2%) GEL PLACE 3 PUMP ONTO  THE SKIN DAILY. 120 g 0  ? tiZANidine (ZANAFLEX) 4 MG tablet Take 4 mg by mouth once.     ? VICTOZA 18 MG/3ML SOPN Inject 0.3 mLs (1.8 mg total) into the skin daily. (54/1.8=30) 9 mL 0  ? zolpidem (AMBIEN CR) 12.5 MG CR tablet Take 1 tablet (12.5 mg total) by mouth at bedtime. 20 tablet 0  ? ?No current facility-administered medications on file prior to visit.  ? ? ? ? ? ? ?Social History  ? ?Socioeconomic History  ? Marital status: Married  ?  Spouse name: Not on file  ? Number of children: Not on file  ? Years of education: Not on file  ? Highest education level: Not on file  ?Occupational History  ? Not on file  ?Tobacco Use  ?  Smoking status: Never  ? Smokeless tobacco: Never  ?Substance and Sexual Activity  ? Alcohol use: No  ? Drug use: No  ?  Comment: prior cocaine abuse (heavy) in his 26s.  denies subsequent use  ? Sexual activity: Yes  ?  Partners: Female  ?  Birth control/protection: None  ?  Comment: on full disability, separated, no regular exercise,walks some, drinks pot of coffee a day.  ?Other Topics Concern  ? Not on file  ?Social History Narrative  ? Drinks one pot of coffee daily.  ? Regular exercise-no, walks some  ?   ? Lives in [place] with roommate.  ? 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  ? ?Social Determinants of Health  ? ?Financial Resource Strain: Not on file  ?Food Insecurity: Not on file  ?Transportation Needs: Not on file  ?Physical Activity: Not on file  ?Stress: Not on file  ?Social Connections: Not on file  ? ? ?Caffeine: Caffeinated Beverages 1 5 hour energy ? ? ?Family History: Reviewed  ?Family History  ?Problem Relation Age of Onset  ? Cancer Mother   ?     lung, heavy smoker  ? Hypertension Mother   ? Hyperlipidemia Mother   ? Cancer Father   ?     melanoma  ? Aneurysm Father   ?     cardiac  ? Hyperlipidemia Father   ? Hypertension Father   ? Hyperlipidemia Sister   ? Hypertension Sister   ? Hyperlipidemia Brother   ? Hypertension Brother   ?  Stroke Other   ? ? ?Psychiatric Specialty Examination: ?Objective: Appearance: casual  ?Eye Contact:: fair  ?Speech: Clear and Coherent and Normal Rate   ?Volume: Normal   ?Mood: fair  ?Affect:  ?Thought Process:

## 2021-07-31 ENCOUNTER — Ambulatory Visit (INDEPENDENT_AMBULATORY_CARE_PROVIDER_SITE_OTHER): Payer: 59 | Admitting: Sports Medicine

## 2021-07-31 ENCOUNTER — Telehealth: Payer: Self-pay | Admitting: Sports Medicine

## 2021-07-31 ENCOUNTER — Ambulatory Visit (INDEPENDENT_AMBULATORY_CARE_PROVIDER_SITE_OTHER): Payer: 59

## 2021-07-31 DIAGNOSIS — M170 Bilateral primary osteoarthritis of knee: Secondary | ICD-10-CM | POA: Diagnosis not present

## 2021-07-31 NOTE — Telephone Encounter (Signed)
Lets try Visco again, bilateral, x-ray confirmed, failed greater than 6 weeks of therapy, injections, oral medications. ?

## 2021-07-31 NOTE — Progress Notes (Signed)
? ? ?  Procedures performed today:   ? ?Procedure: Real-time Ultrasound Guided injection of the left knee ?Device: Samsung HS60  ?Verbal informed consent obtained.  ?Time-out conducted.  ?Noted no overlying erythema, induration, or other signs of local infection.  ?Skin prepped in a sterile fashion.  ?Local anesthesia: Topical Ethyl chloride.  ?With sterile technique and under real time ultrasound guidance: Mild effusion noted, 1 cc Kenalog 40, 2 cc lidocaine, 2 cc bupivacaine injected easily ?Completed without difficulty  ?Advised to call if fevers/chills, erythema, induration, drainage, or persistent bleeding.  ?Images permanently stored and available for review in PACS.  ?Impression: Technically successful ultrasound guided injection. ? ?Procedure: Real-time Ultrasound Guided injection of the right knee ?Device: Samsung HS60  ?Verbal informed consent obtained.  ?Time-out conducted.  ?Noted no overlying erythema, induration, or other signs of local infection.  ?Skin prepped in a sterile fashion.  ?Local anesthesia: Topical Ethyl chloride.  ?With sterile technique and under real time ultrasound guidance: Mild effusion noted, 1 cc Kenalog 40, 2 cc lidocaine, 2 cc bupivacaine injected easily ?Completed without difficulty  ?Advised to call if fevers/chills, erythema, induration, drainage, or persistent bleeding.  ?Images permanently stored and available for review in PACS.  ?Impression: Technically successful ultrasound guided injection. ? ?Independent interpretation of notes and tests performed by another provider:  ? ?None. ? ?Brief History, Exam, Impression, and Recommendations:   ? ?Primary osteoarthritis and CPPD of both knees ?Rodney Bright returns, he is a 61 year old male with x-ray confirmed osteoarthritis, he did a series of Synvisc in January 2022, then had a recurrence of pain. ?He has had a few steroid injections along the way, he is interested in doing viscosupplementation again so we will work on getting him  approved again. ?Bilateral steroid injections today for symptom relief. ?Return to start Orthovisc when approved. ? ?Chronic process with exacerbation and pharmacologic intervention ? ?___________________________________________ ?Ihor Austin. Benjamin Stain, M.D., ABFM., CAQSM. ?Primary Care and Sports Medicine ?[facility] ? ?Adjunct Instructor of Family Medicine  ?[employer] ?

## 2021-07-31 NOTE — Telephone Encounter (Signed)
Faxed orthovisc for benefit summery  ?

## 2021-07-31 NOTE — Assessment & Plan Note (Signed)
Rodney Bright returns, he is a 61 year old male with x-ray confirmed osteoarthritis, he did a series of Synvisc in January 2022, then had a recurrence of pain. ?He has had a few steroid injections along the way, he is interested in doing viscosupplementation again so we will work on getting him approved again. ?Bilateral steroid injections today for symptom relief. ?Return to start Orthovisc when approved. ?

## 2021-08-03 MED ORDER — EUFLEXXA 20 MG/2ML IX SOSY: PREFILLED_SYRINGE | INTRA_ARTICULAR | 0 refills | Status: AC

## 2021-08-03 NOTE — Telephone Encounter (Signed)
Benefits Investigation Details received from [employer] ?Injection: Euflexxa ? ?Medical: Deductible does not apply. Once the OOP has been met, patient is covered at 100%. Prior authorization is required for the drug through [employer].  ? ?PA required: Yes ?PA form downloaded from [employer] and faxed to [place] at [phone]  ?Fax confirmation received ? ?Pharmacy: The product is not covered under the pharmacy plan.  ? ?Specialty Pharmacy: [facility] ? ?May fill through: Specialty Pharmacy ?OV Copay/Coinsurance:  ?Product Copay: 10% ?Administration Coinsurance: 10% ?Administration Copay:  ?Deductible:  ?Out of Pocket Max: $2800 (met: $26.08)   ? ?Auth obtained good from 08/06/2021 through 08/06/2022. Patient aware that he will be contacted by the specialty pharmacy for copay and shipment verification.  ? ?Prescription tee'd up, please verify prior to sending for bilateral knee.  ?

## 2021-08-03 NOTE — Telephone Encounter (Signed)
Sent!

## 2021-08-03 NOTE — Addendum Note (Signed)
Addended by: Monica Becton on: 08/03/2021 01:05 PM ? ? Modules accepted: Orders ? ?

## 2021-08-03 NOTE — Addendum Note (Signed)
Addended by: Dema Severin on: 08/03/2021 11:44 AM ? ? Modules accepted: Orders ? ?

## 2021-08-07 ENCOUNTER — Ambulatory Visit (INDEPENDENT_AMBULATORY_CARE_PROVIDER_SITE_OTHER): Payer: 59 | Admitting: Family Medicine

## 2021-08-07 ENCOUNTER — Encounter: Payer: Self-pay | Admitting: Family Medicine

## 2021-08-07 VITALS — BP 139/72 | HR 64 | Ht 68.5 in | Wt 350.0 lb

## 2021-08-07 DIAGNOSIS — E118 Type 2 diabetes mellitus with unspecified complications: Secondary | ICD-10-CM | POA: Diagnosis not present

## 2021-08-07 DIAGNOSIS — I10 Essential (primary) hypertension: Secondary | ICD-10-CM

## 2021-08-07 DIAGNOSIS — I480 Paroxysmal atrial fibrillation: Secondary | ICD-10-CM

## 2021-08-07 DIAGNOSIS — Z125 Encounter for screening for malignant neoplasm of prostate: Secondary | ICD-10-CM

## 2021-08-07 DIAGNOSIS — Z794 Long term (current) use of insulin: Secondary | ICD-10-CM | POA: Diagnosis not present

## 2021-08-07 DIAGNOSIS — F988 Other specified behavioral and emotional disorders with onset usually occurring in childhood and adolescence: Secondary | ICD-10-CM

## 2021-08-07 DIAGNOSIS — E291 Testicular hypofunction: Secondary | ICD-10-CM

## 2021-08-07 DIAGNOSIS — R69 Illness, unspecified: Secondary | ICD-10-CM | POA: Diagnosis not present

## 2021-08-07 LAB — POCT GLYCOSYLATED HEMOGLOBIN (HGB A1C): Hemoglobin A1C: 6 % — AB (ref 4.0–5.6)

## 2021-08-07 MED ORDER — VICTOZA 18 MG/3ML ~~LOC~~ SOPN: PEN_INJECTOR | SUBCUTANEOUS | 5 refills | Status: AC

## 2021-08-07 MED ORDER — AMPHETAMINE-DEXTROAMPHETAMINE 30 MG PO TABS: 1.0000 | ORAL_TABLET | Freq: Two times a day (BID) | ORAL | 0 refills | Status: AC

## 2021-08-07 NOTE — Assessment & Plan Note (Signed)
Discussed options.  His A1c honestly looks phenomenal today but he really feels like the Victoza was helping to control appetite and has gained a fair amount of weight since coming off of it.  He would like to restart the medication he feels like he would do better with the daily injection versus weekly.  We did discuss that some of the other medications can be just a little bit more powerful so it certainly an option.  For now we will restart Victoza and then plan to follow-up in 4 months. ?

## 2021-08-07 NOTE — Assessment & Plan Note (Signed)
Asymptomatic. 

## 2021-08-07 NOTE — Assessment & Plan Note (Signed)
Blood pressure little borderline today.  Discussed monitoring at home over the next couple of weeks.  Optimally systolic pressure needs to be under 130.  To have room to adjust his lisinopril if needed. ?

## 2021-08-07 NOTE — Progress Notes (Addendum)
? ?Established Patient Office Visit ? ?Subjective   ?Patient ID: Rodney Bright, male    DOB: 10/28/1960  Age: 61 y.o. MRN: [id number] ? ?Chief Complaint  ?Patient presents with  ? Diabetes  ? Hypertension  ? ? ?HPI ? ?Hypertension- Pt denies chest pain, SOB, dizziness, or heart palpitations.  Taking meds as directed w/o problems.  Denies medication side effects.   ? ?Diabetes - no hypoglycemic events. No wounds or sores that are not healing well. No increased thirst or urination. Checking glucose at home. Taking medications as prescribed without any side effects.Hasn't been on Victoza on for several months.   ? ?Hypogonadism - On Testerone -usually uses 3-4 pumps Labs are overdue. ? ?Fibrillation-follows with Dr. Jens Bright twice a year.  Reports that overall he is actually doing great.  He is completely off of anticoagulation. ? ?ADD - Reports symptoms are well controlled on current regime. Denies any problems with insomnia, chest pain, palpitations, or SOB.   ? ? ? ? ? ?ROS ? ?  ?Objective:  ?  ? ?BP 139/72   Pulse 64   Ht 5' 8.5" (1.74 m)   Wt (!) 350 lb (158.8 kg)   SpO2 95%   BMI 52.44 kg/m?  ? ? ?Physical Exam ?Constitutional:   ?   Appearance: He is well-developed.  ?HENT:  ?   Head: Normocephalic and atraumatic.  ?Cardiovascular:  ?   Rate and Rhythm: Normal rate and regular rhythm.  ?   Heart sounds: Normal heart sounds.  ?Pulmonary:  ?   Effort: Pulmonary effort is normal.  ?   Breath sounds: Normal breath sounds.  ?Musculoskeletal:     ?   General: No swelling.  ?Skin: ?   General: Skin is warm and dry.  ?Neurological:  ?   Mental Status: He is alert and oriented to person, place, and time.  ?Psychiatric:     ?   Behavior: Behavior normal.  ? ? ? ?Results for orders placed or performed in visit on 08/07/21  ?POCT glycosylated hemoglobin (Hb A1C)  ?Result Value Ref Range  ? Hemoglobin A1C 6.0 (A) 4.0 - 5.6 %  ? HbA1c POC (<> result, manual entry)    ? HbA1c, POC (prediabetic range)    ? HbA1c, POC  (controlled diabetic range)    ? ? ? ? ?The 10-year ASCVD risk score (Arnett DK, et al., 2019) is: 21.6% ? ?  ?Assessment & Plan:  ? ?Problem List Items Addressed This Visit   ? ?  ? Cardiovascular and Mediastinum  ? ESSENTIAL HYPERTENSION, BENIGN  ?  Blood pressure little borderline today.  Discussed monitoring at home over the next couple of weeks.  Optimally systolic pressure needs to be under 130.  To have room to adjust his lisinopril if needed. ? ?  ?  ? Relevant Orders  ? Testosterone Total,Free,Bio, Males  ? Lipid Panel w/reflex Direct LDL  ? COMPLETE METABOLIC PANEL WITH GFR  ? CBC  ? Uric acid  ? Urine Microalbumin w/creat. ratio  ? Atrial fibrillation (HCC) - Primary  ?  Asymptomatic. ? ?  ?  ? Relevant Orders  ? Testosterone Total,Free,Bio, Males  ? Lipid Panel w/reflex Direct LDL  ? COMPLETE METABOLIC PANEL WITH GFR  ? CBC  ? Uric acid  ? Urine Microalbumin w/creat. ratio  ?  ? Endocrine  ? Hypogonadism in male  ? Relevant Orders  ? Testosterone Total,Free,Bio, Males  ? Lipid Panel w/reflex Direct LDL  ? COMPLETE METABOLIC  PANEL WITH GFR  ? CBC  ? Uric acid  ? Urine Microalbumin w/creat. ratio  ? PSA  ? Controlled diabetes mellitus type 2 with complications (HCC)  ?  Discussed options.  His A1c honestly looks phenomenal today but he really feels like the Victoza was helping to control appetite and has gained a fair amount of weight since coming off of it.  He would like to restart the medication he feels like he would do better with the daily injection versus weekly.  We did discuss that some of the other medications can be just a little bit more powerful so it certainly an option.  For now we will restart Victoza and then plan to follow-up in 4 months. ? ?  ?  ? Relevant Medications  ? liraglutide (VICTOZA) 18 MG/3ML SOPN  ? Other Relevant Orders  ? POCT glycosylated hemoglobin (Hb A1C) (Completed)  ? Testosterone Total,Free,Bio, Males  ? Lipid Panel w/reflex Direct LDL  ? COMPLETE METABOLIC PANEL WITH  GFR  ? CBC  ? Uric acid  ? Urine Microalbumin w/creat. ratio  ?  ? Other  ? Attention deficit disorder (ADD) without hyperactivity  ?  Well controlled. Continue current regimen. Follow up in  6 mo  ? ?  ?  ? Relevant Medications  ? amphetamine-dextroamphetamine (ADDERALL) 30 MG tablet (Start on 09/06/2021)  ? amphetamine-dextroamphetamine (ADDERALL) 30 MG tablet  ? amphetamine-dextroamphetamine (ADDERALL) 30 MG tablet (Start on 10/06/2021)  ? ?Other Visit Diagnoses   ? ? Screening for prostate cancer      ? Relevant Orders  ? PSA  ? ?  ? ? ?Return in about 4 months (around 12/08/2021) for Wellness Exam.  ? ? ?Nani Gasser, MD ? ?

## 2021-08-07 NOTE — Assessment & Plan Note (Signed)
Well controlled. Continue current regimen. Follow up in  6 mo  

## 2021-08-13 ENCOUNTER — Other Ambulatory Visit: Payer: Self-pay | Admitting: Family Medicine

## 2021-08-13 ENCOUNTER — Other Ambulatory Visit: Payer: Self-pay | Admitting: *Deleted

## 2021-08-13 DIAGNOSIS — G4709 Other insomnia: Secondary | ICD-10-CM

## 2021-08-13 MED ORDER — ZOLPIDEM TARTRATE ER 12.5 MG PO TBCR
12.5000 mg | EXTENDED_RELEASE_TABLET | Freq: Every day | ORAL | 0 refills | Status: AC
Start: 2021-08-13 — End: ?

## 2021-08-20 DIAGNOSIS — R071 Chest pain on breathing: Secondary | ICD-10-CM | POA: Diagnosis not present

## 2021-08-20 DIAGNOSIS — R7989 Other specified abnormal findings of blood chemistry: Secondary | ICD-10-CM | POA: Diagnosis not present

## 2021-08-20 DIAGNOSIS — R590 Localized enlarged lymph nodes: Secondary | ICD-10-CM | POA: Diagnosis not present

## 2021-08-20 DIAGNOSIS — G4733 Obstructive sleep apnea (adult) (pediatric): Secondary | ICD-10-CM | POA: Diagnosis not present

## 2021-08-20 DIAGNOSIS — E119 Type 2 diabetes mellitus without complications: Secondary | ICD-10-CM | POA: Diagnosis not present

## 2021-08-20 DIAGNOSIS — R0781 Pleurodynia: Secondary | ICD-10-CM | POA: Diagnosis not present

## 2021-08-20 DIAGNOSIS — Z743 Need for continuous supervision: Secondary | ICD-10-CM | POA: Diagnosis not present

## 2021-08-20 DIAGNOSIS — Z20822 Contact with and (suspected) exposure to covid-19: Secondary | ICD-10-CM | POA: Diagnosis not present

## 2021-08-20 DIAGNOSIS — R069 Unspecified abnormalities of breathing: Secondary | ICD-10-CM | POA: Diagnosis not present

## 2021-08-20 DIAGNOSIS — J168 Pneumonia due to other specified infectious organisms: Secondary | ICD-10-CM | POA: Diagnosis not present

## 2021-08-20 DIAGNOSIS — J984 Other disorders of lung: Secondary | ICD-10-CM | POA: Diagnosis not present

## 2021-08-20 DIAGNOSIS — R059 Cough, unspecified: Secondary | ICD-10-CM | POA: Diagnosis not present

## 2021-08-21 ENCOUNTER — Telehealth: Payer: Self-pay | Admitting: General Practice

## 2021-08-21 MED ORDER — ALBUTEROL SULFATE HFA 108 (90 BASE) MCG/ACT IN AERS: 2.0000 | INHALATION_SPRAY | Freq: Four times a day (QID) | RESPIRATORY_TRACT | 0 refills | Status: DC | PRN

## 2021-08-21 NOTE — Telephone Encounter (Signed)
Meds ordered this encounter  °Medications  ° albuterol (VENTOLIN HFA) 108 (90 Base) MCG/ACT inhaler  °  Sig: Inhale 2 puffs into the lungs every 6 (six) hours as needed for wheezing or shortness of breath.  °  Dispense:  18 g  °  Refill:  0  ° ° °

## 2021-08-21 NOTE — Telephone Encounter (Signed)
Transition Care Management Follow-up Telephone Call ?Date of discharge and from where: 08/20/21 from [facility] ?How have you been since you were released from the hospital? Patient was diagnosed with pneumonia. He still feels pretty bad and had a hard time sleeping. Breathing is ok. He does not have a pulse oximeter at home but is getting one hopefully today. He was not given any inhalers or decongestant. He has started his antibiotics yesterday. He is requesting an inhaler. I advised him that I would ask the PCP. Patient is aware to return back to the E.R. if he has shortness of breath, chest pain, difficulty breathing or chest pressure.  ?Any questions or concerns? No ? ?Items Reviewed: ?Did the pt receive and understand the discharge instructions provided? Yes  ?Medications obtained and verified? No  ?Other? No  ?Any new allergies since your discharge? No  ?Dietary orders reviewed? Yes ?Do you have support at home? Yes  ? ?Home Care and Equipment/Supplies: ?Were home health services ordered? no ? ?Functional Questionnaire: (I = Independent and D = Dependent) ?ADLs: I ? ?Bathing/Dressing- I ? ?Meal Prep- I ? ?Eating- I ? ?Maintaining continence- I ? ?Transferring/Ambulation- I ? ?Managing Meds- I ? ?Follow up appointments reviewed: ? ?PCP Hospital f/u appt confirmed? Yes  Scheduled to see Nani Gasser, MDon 08/28/21 @ 420pm. ?Specialist Hospital f/u appt confirmed? No   ?Are transportation arrangements needed? No  ?If their condition worsens, is the pt aware to call PCP or go to the Emergency Dept.? Yes ?Was the patient provided with contact information for the PCP's office or ED? Yes ?Was to pt encouraged to call back with questions or concerns? Yes  ?

## 2021-08-21 NOTE — Telephone Encounter (Signed)
Pt advised.

## 2021-08-21 NOTE — Telephone Encounter (Signed)
Patient was called for his transition of care call after his hospital visit. He was diagnosed with pneumonia and was prescribed antibiotics for five days but was not given an inhaler. He is requesting if there was anyway, he could be prescribed one. He feels that he would benefit from it. ?

## 2021-08-23 NOTE — Telephone Encounter (Signed)
Spoke with [facility]. Gave patient the phone number to contact pharmacy to initiate shipment.

## 2021-08-28 ENCOUNTER — Telehealth: Payer: Self-pay | Admitting: Neurology

## 2021-08-28 ENCOUNTER — Inpatient Hospital Stay: Payer: 59 | Admitting: Family Medicine

## 2021-08-28 NOTE — Telephone Encounter (Signed)
Patient left vm asking if we have received medication for patient to get injection so he can schedule his appt. Medication not received yet.

## 2021-08-28 NOTE — Telephone Encounter (Signed)
Please call patient once we get medication in.

## 2021-09-04 ENCOUNTER — Inpatient Hospital Stay: Payer: 59 | Admitting: Family Medicine

## 2021-09-07 NOTE — Telephone Encounter (Signed)
Medication was rec'd in office and patient was scheduled.

## 2021-09-19 ENCOUNTER — Other Ambulatory Visit: Payer: Self-pay | Admitting: Family Medicine

## 2021-09-26 ENCOUNTER — Ambulatory Visit (INDEPENDENT_AMBULATORY_CARE_PROVIDER_SITE_OTHER): Payer: 59

## 2021-09-26 ENCOUNTER — Ambulatory Visit: Payer: 59 | Admitting: Sports Medicine

## 2021-09-26 DIAGNOSIS — M170 Bilateral primary osteoarthritis of knee: Secondary | ICD-10-CM

## 2021-09-26 DIAGNOSIS — M7062 Trochanteric bursitis, left hip: Secondary | ICD-10-CM | POA: Diagnosis not present

## 2021-09-26 MED ORDER — PREGABALIN 100 MG PO CAPS: 100.0000 mg | ORAL_CAPSULE | Freq: Two times a day (BID) | ORAL | 3 refills | Status: AC

## 2021-09-26 NOTE — Assessment & Plan Note (Signed)
Euflexxa 1 of 3 both knees, return in 1 week for #2 of 3.

## 2021-09-26 NOTE — Assessment & Plan Note (Signed)
New onset pain left lateral hip, tender to palpation over the greater trochanter. Adding x-rays, home conditioning, Lyrica, we will discontinue his gabapentin. Return to see me for this in about 2 weeks or 3 weeks and if not starting to get better we will consider an injection.

## 2021-09-26 NOTE — Progress Notes (Signed)
    Procedures performed today:    Procedure: Real-time Ultrasound Guided injection of the left knee Device: Samsung HS60  Verbal informed consent obtained.  Time-out conducted.  Noted no overlying erythema, induration, or other signs of local infection.  Skin prepped in a sterile fashion.  Local anesthesia: Topical Ethyl chloride.  With sterile technique and under real time ultrasound guidance: Mild effusion noted, Euflexxa injected easily into the knee through a 22-gauge needle. Advised to call if fevers/chills, erythema, induration, drainage, or persistent bleeding.  Images permanently stored and available for review in PACS.  Impression: Technically successful ultrasound guided injection.  Procedure: Real-time Ultrasound Guided injection of the right knee Device: Samsung HS60  Verbal informed consent obtained.  Time-out conducted.  Noted no overlying erythema, induration, or other signs of local infection.  Skin prepped in a sterile fashion.  Local anesthesia: Topical Ethyl chloride.  With sterile technique and under real time ultrasound guidance: Mild effusion noted, Euflexxa injected easily into the knee through a 22-gauge needle. Advised to call if fevers/chills, erythema, induration, drainage, or persistent bleeding.  Images permanently stored and available for review in PACS.  Impression: Technically successful ultrasound guided injection.  Independent interpretation of notes and tests performed by another provider:   None.  Brief History, Exam, Impression, and Recommendations:    Primary osteoarthritis and CPPD of both knees Euflexxa 1 of 3 both knees, return in 1 week for #2 of 3.  Greater trochanteric bursitis, left New onset pain left lateral hip, tender to palpation over the greater trochanter. Adding x-rays, home conditioning, Lyrica, we will discontinue his gabapentin. Return to see me for this in about 2 weeks or 3 weeks and if not starting to get better we  will consider an injection.    ___________________________________________ Ihor Austin. Benjamin Stain, M.D., ABFM., CAQSM. Primary Care and Sports Medicine [employer] [facility]  Adjunct Instructor of Family Medicine  [employer]

## 2021-10-03 ENCOUNTER — Ambulatory Visit (INDEPENDENT_AMBULATORY_CARE_PROVIDER_SITE_OTHER): Payer: 59

## 2021-10-03 ENCOUNTER — Ambulatory Visit (INDEPENDENT_AMBULATORY_CARE_PROVIDER_SITE_OTHER): Payer: 59 | Admitting: Sports Medicine

## 2021-10-03 DIAGNOSIS — M170 Bilateral primary osteoarthritis of knee: Secondary | ICD-10-CM

## 2021-10-03 MED ORDER — TIZANIDINE HCL 4 MG PO TABS: 4.0000 mg | ORAL_TABLET | Freq: Two times a day (BID) | ORAL | 3 refills | Status: AC

## 2021-10-03 NOTE — Assessment & Plan Note (Addendum)
Patient requesting steroid injection today, last done 2 months ago, Euflexxa 2 of 3 and steroid injected into both knees, return in 1 week for #3 of 3. No additional steroid injections for at least 6 months.

## 2021-10-03 NOTE — Progress Notes (Signed)
    Procedures performed today:    Procedure: Real-time Ultrasound Guided injection of the left knee Device: Samsung HS60  Verbal informed consent obtained.  Time-out conducted.  Noted no overlying erythema, induration, or other signs of local infection.  Skin prepped in a sterile fashion.  Local anesthesia: Topical Ethyl chloride.  With sterile technique and under real time ultrasound guidance: Mild effusion noted, 1 cc kenalog 40, 2 cc lidocaine, 2 cc bupivacaine injected, syringe switched and Euflexxa injected easily into the knee through a 22-gauge needle. Advised to call if fevers/chills, erythema, induration, drainage, or persistent bleeding.  Images permanently stored and available for review in PACS.  Impression: Technically successful ultrasound guided injection.   Procedure: Real-time Ultrasound Guided injection of the right knee Device: Samsung HS60  Verbal informed consent obtained.  Time-out conducted.  Noted no overlying erythema, induration, or other signs of local infection.  Skin prepped in a sterile fashion.  Local anesthesia: Topical Ethyl chloride.  With sterile technique and under real time ultrasound guidance: Mild effusion noted, 1 cc kenalog 40, 2 cc lidocaine, 2 cc bupivacaine injected, syringe switched and Euflexxa injected easily into the knee through a 22-gauge needle. Advised to call if fevers/chills, erythema, induration, drainage, or persistent bleeding.  Images permanently stored and available for review in PACS.  Impression: Technically successful ultrasound guided injection.  Independent interpretation of notes and tests performed by another provider:   None.  Brief History, Exam, Impression, and Recommendations:    Primary osteoarthritis and CPPD of both knees Patient requesting steroid injection today, last done 2 months ago, Euflexxa 2 of 3 and steroid injected into both knees, return in 1 week for #3 of 3. No additional steroid injections for  at least 6 months.    ____________________________________________ Rodney Bright. Benjamin Stain, M.D., ABFM., CAQSM., AME. Primary Care and Sports Medicine [place] [facility]  Adjunct Professor of Family Medicine  [employer]  Restaurant manager, fast food

## 2021-10-10 ENCOUNTER — Ambulatory Visit (INDEPENDENT_AMBULATORY_CARE_PROVIDER_SITE_OTHER): Payer: 59

## 2021-10-10 ENCOUNTER — Ambulatory Visit (INDEPENDENT_AMBULATORY_CARE_PROVIDER_SITE_OTHER): Payer: 59 | Admitting: Sports Medicine

## 2021-10-10 VITALS — BP 118/80 | HR 66

## 2021-10-10 DIAGNOSIS — I480 Paroxysmal atrial fibrillation: Secondary | ICD-10-CM

## 2021-10-10 DIAGNOSIS — D72829 Elevated white blood cell count, unspecified: Secondary | ICD-10-CM | POA: Diagnosis not present

## 2021-10-10 DIAGNOSIS — M170 Bilateral primary osteoarthritis of knee: Secondary | ICD-10-CM | POA: Diagnosis not present

## 2021-10-10 DIAGNOSIS — R071 Chest pain on breathing: Secondary | ICD-10-CM

## 2021-10-10 NOTE — Progress Notes (Addendum)
    Procedures performed today:    Procedure: Real-time Ultrasound Guided injection of the left knee Device: Samsung HS60  Verbal informed consent obtained.  Time-out conducted.  Noted no overlying erythema, induration, or other signs of local infection.  Skin prepped in a sterile fashion.  Local anesthesia: Topical Ethyl chloride.  With sterile technique and under real time ultrasound guidance: Effusion noted, a syringe of Euflexxa injected easily into the suprapatellar recess. Completed without difficulty  Advised to call if fevers/chills, erythema, induration, drainage, or persistent bleeding.  Images permanently stored and available for review in PACS.  Impression: Technically successful ultrasound guided injection.  Procedure: Real-time Ultrasound Guided injection of the right knee Device: Samsung HS60  Verbal informed consent obtained.  Time-out conducted.  Noted no overlying erythema, induration, or other signs of local infection.  Skin prepped in a sterile fashion.  Local anesthesia: Topical Ethyl chloride.  With sterile technique and under real time ultrasound guidance: Effusion noted, a syringe of Euflexxa injected easily into the suprapatellar recess. Completed without difficulty  Advised to call if fevers/chills, erythema, induration, drainage, or persistent bleeding.  Images permanently stored and available for review in PACS.  Impression: Technically successful ultrasound guided injection.  Independent interpretation of notes and tests performed by another provider:   None.  Brief History, Exam, Impression, and Recommendations:    Primary osteoarthritis and CPPD of both knees Euflexxa 3 of 3 both knees, we did a steroid injection at the last visit as well. No additional steroid injections for at least 6 months.  Atrial fibrillation (HCC) Rodney Bright has also been having some dizziness/lightheadedness, particularly on standing. He does have history of atrial  fibrillation, currently treated with amiodarone. No chest pain currently. Mild shortness of breath on Valsalva. Lungs are clear on exam, he is irregularly irregular with auscultation and palpation of his pulse. Blood pressure is a little bit low for him today, pulse rate in the 60s. We will hold his lisinopril and he can liberalize salt in his diet for maybe a week until he can see his cardiologist. We will get some labs including CBC, CMP, D-dimer, TSH.  Leukocytosis Labs do show significant abnormalities, white blood cell count is elevated to 20,000, elevated D-dimer, acute on chronic worsening of renal function, looks like he was sent to the ED over the evening, CT angio negative for PE.  I spent 30 minutes of total time managing this patient today, this includes chart review, face to face, and non-face to face time.  ____________________________________________ Rodney Bright. Benjamin Stain, M.D., ABFM., CAQSM., AME. Primary Care and Sports Medicine [place] [facility]  Adjunct Professor of Family Medicine  [employer]  Restaurant manager, fast food

## 2021-10-10 NOTE — Assessment & Plan Note (Signed)
Euflexxa 3 of 3 both knees, we did a steroid injection at the last visit as well. No additional steroid injections for at least 6 months.

## 2021-10-10 NOTE — Assessment & Plan Note (Signed)
Rodney Bright has also been having some dizziness/lightheadedness, particularly on standing. He does have history of atrial fibrillation, currently treated with amiodarone. No chest pain currently. Mild shortness of breath on Valsalva. Lungs are clear on exam, he is irregularly irregular with auscultation and palpation of his pulse. Blood pressure is a little bit low for him today, pulse rate in the 60s. We will hold his lisinopril and he can liberalize salt in his diet for maybe a week until he can see his cardiologist. We will get some labs including CBC, CMP, D-dimer, TSH.

## 2021-10-10 NOTE — Patient Instructions (Signed)
Follow-up with your cardiologist in a week or 2, hold lisinopril for now.  Liberalize salt in diet.

## 2021-10-11 ENCOUNTER — Emergency Department (HOSPITAL_BASED_OUTPATIENT_CLINIC_OR_DEPARTMENT_OTHER)
Admission: EM | Admit: 2021-10-11 | Discharge: 2021-10-11 | Disposition: A | Discharge status: Home / Self Care | Payer: 59 | Attending: Emergency Medicine | Admitting: Emergency Medicine

## 2021-10-11 ENCOUNTER — Telehealth: Payer: Self-pay | Admitting: Physician Assistant

## 2021-10-11 ENCOUNTER — Encounter (HOSPITAL_BASED_OUTPATIENT_CLINIC_OR_DEPARTMENT_OTHER): Payer: Self-pay | Admitting: Emergency Medicine

## 2021-10-11 ENCOUNTER — Emergency Department (HOSPITAL_BASED_OUTPATIENT_CLINIC_OR_DEPARTMENT_OTHER): Payer: 59

## 2021-10-11 ENCOUNTER — Other Ambulatory Visit: Payer: Self-pay

## 2021-10-11 DIAGNOSIS — Z79899 Other long term (current) drug therapy: Secondary | ICD-10-CM | POA: Diagnosis not present

## 2021-10-11 DIAGNOSIS — F1193 Opioid use, unspecified with withdrawal: Secondary | ICD-10-CM | POA: Diagnosis not present

## 2021-10-11 DIAGNOSIS — R0602 Shortness of breath: Secondary | ICD-10-CM | POA: Insufficient documentation

## 2021-10-11 DIAGNOSIS — J9811 Atelectasis: Secondary | ICD-10-CM | POA: Diagnosis not present

## 2021-10-11 DIAGNOSIS — Z7982 Long term (current) use of aspirin: Secondary | ICD-10-CM | POA: Diagnosis not present

## 2021-10-11 DIAGNOSIS — R7989 Other specified abnormal findings of blood chemistry: Secondary | ICD-10-CM | POA: Insufficient documentation

## 2021-10-11 DIAGNOSIS — N179 Acute kidney failure, unspecified: Secondary | ICD-10-CM | POA: Insufficient documentation

## 2021-10-11 DIAGNOSIS — R69 Illness, unspecified: Secondary | ICD-10-CM | POA: Diagnosis not present

## 2021-10-11 DIAGNOSIS — R0600 Dyspnea, unspecified: Secondary | ICD-10-CM

## 2021-10-11 DIAGNOSIS — D72829 Elevated white blood cell count, unspecified: Secondary | ICD-10-CM | POA: Insufficient documentation

## 2021-10-11 LAB — COMPLETE METABOLIC PANEL WITH GFR
AG Ratio: 1.6 (calc) (ref 1.0–2.5)
ALT: 30 U/L (ref 9–46)
AST: 22 U/L (ref 10–35)
Albumin: 4.6 g/dL (ref 3.6–5.1)
Alkaline phosphatase (APISO): 100 U/L (ref 35–144)
BUN/Creatinine Ratio: 24 (calc) — ABNORMAL HIGH (ref 6–22)
BUN: 48 mg/dL — ABNORMAL HIGH (ref 7–25)
CO2: 20 mmol/L (ref 20–32)
Calcium: 10.2 mg/dL (ref 8.6–10.3)
Chloride: 102 mmol/L (ref 98–110)
Creat: 2.02 mg/dL — ABNORMAL HIGH (ref 0.70–1.35)
Globulin: 2.9 g/dL (calc) (ref 1.9–3.7)
Glucose, Bld: 256 mg/dL — ABNORMAL HIGH (ref 65–99)
Potassium: 4.7 mmol/L (ref 3.5–5.3)
Sodium: 136 mmol/L (ref 135–146)
Total Bilirubin: 1 mg/dL (ref 0.2–1.2)
Total Protein: 7.5 g/dL (ref 6.1–8.1)
eGFR: 36 mL/min/{1.73_m2} — ABNORMAL LOW (ref >=60)

## 2021-10-11 LAB — CBC
HCT: 53 % — ABNORMAL HIGH (ref 38.5–50.0)
Hemoglobin: 17 g/dL (ref 13.2–17.1)
MCH: 28.9 pg (ref 27.0–33.0)
MCHC: 32.1 g/dL (ref 32.0–36.0)
MCV: 90 fL (ref 80.0–100.0)
MPV: 11.4 fL (ref 7.5–12.5)
Platelets: 356 10*3/uL (ref 140–400)
RBC: 5.89 10*6/uL — ABNORMAL HIGH (ref 4.20–5.80)
RDW: 14.5 % (ref 11.0–15.0)
WBC: 20 10*3/uL — ABNORMAL HIGH (ref 3.8–10.8)

## 2021-10-11 LAB — BASIC METABOLIC PANEL
Anion gap: 9 (ref 5–15)
BUN: 41 mg/dL — ABNORMAL HIGH (ref 8–23)
CO2: 20 mmol/L — ABNORMAL LOW (ref 22–32)
Calcium: 8.6 mg/dL — ABNORMAL LOW (ref 8.9–10.3)
Chloride: 106 mmol/L (ref 98–111)
Creatinine, Ser: 2.02 mg/dL — ABNORMAL HIGH (ref 0.61–1.24)
GFR, Estimated: 36 mL/min — ABNORMAL LOW (ref >60)
Glucose, Bld: 221 mg/dL — ABNORMAL HIGH (ref 70–99)
Potassium: 4.5 mmol/L (ref 3.5–5.1)
Sodium: 135 mmol/L (ref 135–145)

## 2021-10-11 LAB — TSH: TSH: 3.12 mIU/L (ref 0.40–4.50)

## 2021-10-11 LAB — D-DIMER, QUANTITATIVE: D-Dimer, Quant: 1.02 mcg/mL FEU — ABNORMAL HIGH (ref <0.50)

## 2021-10-11 MED ORDER — SODIUM CHLORIDE 0.9 % IV BOLUS
1000.0000 mL | Freq: Once | INTRAVENOUS | Status: AC
Start: 2021-10-11 — End: 2021-10-11
  Administered 2021-10-11: 1000 mL via INTRAVENOUS

## 2021-10-11 MED ORDER — IOHEXOL 350 MG/ML SOLN
100.0000 mL | Freq: Once | INTRAVENOUS | Status: AC | PRN
  Administered 2021-10-11: 80 mL via INTRAVENOUS

## 2021-10-11 MED ORDER — OXYCODONE-ACETAMINOPHEN 5-325 MG PO TABS
2.0000 | ORAL_TABLET | Freq: Once | ORAL | Status: AC
  Administered 2021-10-11: 2 via ORAL
  Filled 2021-10-11: qty 2

## 2021-10-11 NOTE — Addendum Note (Signed)
Addended by: Monica Becton on: 10/11/2021 08:57 AM   Modules accepted: Orders

## 2021-10-11 NOTE — Discharge Instructions (Signed)
Try and keep yourself hydrated.  Follow-up with your doctors.  Your kidney function will need to be followed.

## 2021-10-11 NOTE — Addendum Note (Signed)
Addended by: Monica Becton on: 10/11/2021 09:13 AM   Modules accepted: Orders

## 2021-10-11 NOTE — Telephone Encounter (Signed)
Called patient with Critical Lab result. Discussed elevated d-dimer, elevated WBC, decreased kidney function. Pt does need CTA to rule out PE. Due to acute kidney injury pt needs IV fluids as well. Pt was told to go to Emergency Department with [facility] so they could see labs and notes. Pt did discuss that he lost his oxycodone about Friday of last week and has had withdrawal symptoms since. He is drinking Gatorade for hydration. No worsening shortness of breath or chest pain. He does not feel like he is having any irregular heart beat at this time. He is not on anti-coagulation.   Pt understood instructions and stated he was going to get ready and go to ED.

## 2021-10-11 NOTE — Assessment & Plan Note (Addendum)
Labs do show significant abnormalities, white blood cell count is elevated to 20,000, elevated D-dimer, acute on chronic worsening of renal function, looks like he was sent to the ED over the evening, CT angio negative for PE.

## 2021-10-11 NOTE — ED Provider Notes (Signed)
[facility] EMERGENCY DEPARTMENT Provider Note   CSN: [id number] Arrival date & time: 10/11/21  0973     History  Chief Complaint  Patient presents with   Irregular Heart Beat    Rodney Bright is a 61 y.o. male.  HPI Patient sent in by sports medicine.  Has increasing shortness of breath.  History of atrial fibrillation.  For the last 4 to 5 days has been feeling worse.  Now having difficulty walking up stairs.  History of atrial fibrillation paroxysmal and states he thinks that he is in A-fib now.  Seen at the clinic yesterday had somewhat increasing creatinine.  Also D-dimer positive.  Had knee injections a few days ago.  States all this started after he went into withdrawal off his oxycodone.  Was on 15 mg oxycodone and lost the bottle.  Has been in withdrawal for around 7 days now.  States he cannot get the medicines until Sunday with today being Thursday.  Has had chills.  Nausea.  Decreased oral intake.  Had some diarrhea.  Did have a steroid injection recently.    Home Medications Prior to Admission medications   Medication Sig Start Date End Date Taking? Authorizing Provider  acetaminophen (TYLENOL) 650 MG CR tablet Take 1 tablet (650 mg total) by mouth every 8 (eight) hours as needed for pain. 07/30/19   Silverio Decamp, MD  albuterol (VENTOLIN HFA) 108 (90 Base) MCG/ACT inhaler TAKE 2 PUFFS BY MOUTH EVERY 6 HOURS AS NEEDED FOR WHEEZE OR SHORTNESS OF BREATH 09/19/21   Hali Marry, MD  alfuzosin (UROXATRAL) 10 MG 24 hr tablet Take 10 mg by mouth daily. 12/13/16   [provider]  allopurinol (ZYLOPRIM) 100 MG tablet Take 1 tablet (100 mg total) by mouth daily. 01/09/21   Terrilyn Saver, NP  AMBULATORY NON FORMULARY MEDICATION Medication Name: CPAP machine with nasal mask and supplies set to 17.5 cm water pressure.  No humidifier.  Sent to [facility].  See sleep study from 2009 attached.  Dx: OSA. 06/05/15   Hali Marry, MD  AMBULATORY NON  FORMULARY MEDICATION Medication Name: Cyclobenzaprine 2% gabapentin 3% Baclofen 2%  Diclofenac 3% Qty# 120 ml Sig: apply small amount topically to effected area 2-3 times a day as directed Fax to [phone] 09/09/19   Silverio Decamp, MD  amiodarone (PACERONE) 200 MG tablet take 1 tablet(s) by mouth daily 06/18/21   Lelon Perla, MD  amphetamine-dextroamphetamine (ADDERALL) 30 MG tablet Take 1 tablet by mouth 2 (two) times daily. 09/06/21   Hali Marry, MD  amphetamine-dextroamphetamine (ADDERALL) 30 MG tablet Take 1 tablet by mouth 2 (two) times daily. 08/07/21   Hali Marry, MD  amphetamine-dextroamphetamine (ADDERALL) 30 MG tablet Take 1 tablet by mouth 2 (two) times daily. 10/06/21   Hali Marry, MD  aspirin EC 81 MG tablet Take 1 tablet (81 mg total) by mouth daily. 05/22/16   Lelon Perla, MD  blood glucose meter kit and supplies KIT Dispense based on patient and insurance preference. Use up to four times daily as directed. (FOR ICD-9 250.00, 250.01). 04/30/16   Hali Marry, MD  buPROPion (WELLBUTRIN XL) 150 MG 24 hr tablet Take 1 tablet (150 mg total) by mouth daily. Take with the 337m. 07/20/21 07/20/22  AMerian Capron MD  buPROPion (WELLBUTRIN XL) 300 MG 24 hr tablet Take 1 tablet (300 mg total) by mouth daily. 07/20/21   AMerian Capron MD  diltiazem (CARDIZEM CD) 240 MG  24 hr capsule Take 1 capsule (240 mg total) by mouth daily. 06/18/21   Lelon Perla, MD  EASY COMFORT PEN NEEDLES 31G X 8 MM MISC use once a day with victoza 04/04/18   [provider]  EASY PLUS II GLUCOSE TEST test strip use to test blood sugar once daily 03/09/18   Hali Marry, MD  escitalopram (LEXAPRO) 20 MG tablet Take twice a day 07/20/21   Merian Capron, MD  indomethacin (INDOCIN) 50 MG capsule 1 capsule p.o. twice daily as needed for pain 02/21/21   Silverio Decamp, MD  liraglutide (VICTOZA) 18 MG/3ML SOPN Inject 0.6 mg into the skin daily  for 7 days, THEN 1.2 mg daily for 7 days, THEN 1.8 mg daily for 7 days, THEN 2.4 mg daily for 7 days. Start 0.477m SQ once a day for 7 days, then increase to 1.2377monce a day. 08/07/21 09/04/21  MeHali MarryMD  lisinopril (ZESTRIL) 10 MG tablet Take 1 tablet (10 mg total) by mouth daily. Labs for further refills 06/18/21   CrLelon PerlaMD  [facility]/0.1ML LIQD nasal spray kit Inject solution in to one nostril for suspected overdose. Repeat in 3-5 minutes if necessary in the other nostril. CALL 911 06/26/17   [provider]  oxyCODONE (ROXICODONE) 15 MG immediate release tablet Take 1 tablet (15 mg total) by mouth every 4 (four) hours as needed for pain. 05/15/20   ThSilverio DecampMD  pravastatin (PRAVACHOL) 40 MG tablet Take 1 tablet (40 mg total) by mouth daily. 06/18/21   CrLelon PerlaMD  pregabalin (LYRICA) 100 MG capsule Take 1 capsule (100 mg total) by mouth 2 (two) times daily. 09/26/21   ThSilverio DecampMD  Sodium Hyaluronate (EUFLEXXA) 20 MG/2ML SOSY Inject 77m74mnto each knee once weekly for 3 weeks. 08/03/21   TheSilverio DecampD  Testosterone 10 MG/ACT (2%) GEL PLACE 3 PUMP ONTO THE SKIN DAILY. 06/13/21   MetHali MarryD  tiZANidine (ZANAFLEX) 4 MG tablet Take 1 tablet (4 mg total) by mouth in the morning and at bedtime. 10/03/21   TheSilverio DecampD  zolpidem (AMBIEN CR) 12.5 MG CR tablet Take 1 tablet (12.5 mg total) by mouth at bedtime. 08/13/21   MetHali MarryD      Allergies    Colchicine, Prunus persica, and Peach flavor    Review of Systems   Review of Systems  Physical Exam Updated Vital Signs BP 102/76   Pulse 60   Temp 98.1 F (36.7 C)   Resp (!) 9   Ht _0  (1.727 m)   Wt (!) 145.6 kg   SpO2 100%   BMI 48.81 kg/m  Physical Exam Vitals and nursing note reviewed.  Constitutional:      Appearance: He is obese.  HENT:     Head: Normocephalic.  Eyes:     Pupils: Pupils are equal, round, and  reactive to light.  Cardiovascular:     Rate and Rhythm: Normal rate and regular rhythm.  Pulmonary:     Breath sounds: No wheezing or rhonchi.  Abdominal:     Tenderness: There is no abdominal tenderness.  Musculoskeletal:     Left lower leg: No edema.  Skin:    Capillary Refill: Capillary refill takes less than 2 seconds.  Neurological:     Mental Status: He is alert and oriented to person, place, and time.     ED Results /  Procedures / Treatments   Labs (all labs ordered are listed, but only abnormal results are displayed) Labs Reviewed  BASIC METABOLIC PANEL - Abnormal; Notable for the following components:      Result Value   CO2 20 (*)    Glucose, Bld 221 (*)    BUN 41 (*)    Creatinine, Ser 2.02 (*)    Calcium 8.6 (*)    GFR, Estimated 36 (*)    All other components within normal limits    EKG EKG Interpretation  Date/Time:  Thursday October 11 2021 06:42:30 EDT Ventricular Rate:  60 PR Interval:  172 QRS Duration: 119 QT Interval:  426 QTC Calculation: 426 R Axis:   -71 Text Interpretation: Sinus rhythm Left anterior fascicular block LVH with secondary repolarization abnormality Probable anterior infarct, age indeterminate Confirmed by Davonna Belling [id number]) on 10/11/2021 7:03:02 AM  Radiology CT Angio Chest PE W and/or Wo Contrast  Result Date: 10/11/2021 CLINICAL DATA:  Pulmonary embolism (PE) suspected, positive D-dimer EXAM: CT ANGIOGRAPHY CHEST WITH CONTRAST TECHNIQUE: Multidetector CT imaging of the chest was performed using the standard protocol during bolus administration of intravenous contrast. Multiplanar CT image reconstructions and MIPs were obtained to evaluate the vascular anatomy. RADIATION DOSE REDUCTION: This exam was performed according to the departmental dose-optimization program which includes automated exposure control, adjustment of the mA and/or kV according to patient size and/or use of iterative reconstruction technique. CONTRAST:  52m  OMNIPAQUE IOHEXOL 350 MG/ML SOLN COMPARISON:  None Available. FINDINGS: Cardiovascular: Satisfactory opacification of the pulmonary arteries to the segmental level. No evidence of pulmonary embolism. Right sided aortic arch. Normal heart size. No pericardial effusion. Mediastinum/Nodes: No enlarged nodes. Included thyroid is unremarkable. Lungs/Pleura: Patchy bibasilar atelectasis. No pleural effusion or pneumothorax. Upper Abdomen: No acute abnormality. Musculoskeletal: Degenerative changes of the included spine. Review of the MIP images confirms the above findings. IMPRESSION: No evidence of acute pulmonary embolism. Mild bibasilar atelectasis. Electronically Signed   By: PMacy MisM.D.   On: 10/11/2021 09:03   [place]ITED JOINT SPACE STRUCTURES LOW BILAT  Result Date: 10/10/2021 Procedure: Real-time Ultrasound Guided injection of the left knee Device: Samsung [id number] Verbal informed consent obtained. Time-out conducted. Noted no overlying erythema, induration, or other signs of local infection. Skin prepped in a sterile fashion. Local anesthesia: Topical Ethyl chloride. With sterile technique and under real time ultrasound guidance: Effusion noted, a syringe of Euflexxa injected easily into the suprapatellar recess. Completed without difficulty Advised to call if fevers/chills, erythema, induration, drainage, or persistent bleeding. Images permanently stored and available for review in PACS. Impression: Technically successful ultrasound guided injection.   Procedure: Real-time Ultrasound Guided injection of the right knee Device: Samsung [id number] Verbal informed consent obtained. Time-out conducted. Noted no overlying erythema, induration, or other signs of local infection. Skin prepped in a sterile fashion. Local anesthesia: Topical Ethyl chloride. With sterile technique and under real time ultrasound guidance: Effusion noted, a syringe of Euflexxa injected easily into the suprapatellar recess. Completed without  difficulty Advised to call if fevers/chills, erythema, induration, drainage, or persistent bleeding. Images permanently stored and available for review in PACS. Impression: Technically successful ultrasound guided injection.   Procedures Procedures    Medications Ordered in ED Medications  oxyCODONE-acetaminophen (PERCOCET/ROXICET) 5-325 MG per tablet 2 tablet (2 tablets Oral Given 10/11/21 0731)  sodium chloride 0.9 % bolus 1,000 mL (0 mLs Intravenous Stopped 10/11/21 0853)  iohexol (OMNIPAQUE) 350 MG/ML injection 100 mL (80 mLs Intravenous Contrast Given 10/11/21 0834)  ED Course/ Medical Decision Making/ A&P                           Medical Decision Making Amount and/or Complexity of Data Reviewed Labs: ordered. Radiology: ordered.  Risk Prescription drug management.   Patient with shortness of breath and fatigue.  Began after going into opiate withdrawal.  Seen at PCP.  Has increasing creatinine.  Mildly hypotensive here.  Fluid bolus to be given.  Infection felt less likely.  White count is elevated but I think this is more likely secondary to the steroid injection.  Had positive D-dimer as an outpatient.  Sent in for CT scan.  Will hydrate to help with the creatinine and the mildly low blood pressure.  Will recheck basic metabolic to check on renal function.  Also will give some oxycodone at this point to help with the withdrawal but will not prescribe as an outpatient. I reviewed the sports medicine note.  Creatinine stable at 2.  Fluid bolus given.  Not in A-fib.  Feeling somewhat better after fluids.  States he still felt a little dizzy.  I think likely component of dehydration with the opiate withdrawal has been going through.  Given fluid and feels somewhat better.  CT scan done independently interpreted and shows no pulmonary embolism.  White count elevated yesterday but I think likely second to the steroids.  Severe infection felt less likely.  Not in atrial fibrillation.   Appears stable for discharge.  Outpatient follow-up as needed.        Final Clinical Impression(s) / ED Diagnoses Final diagnoses:  Dyspnea, unspecified type  Opioid withdrawal [facility])    Rx / DC Orders ED Discharge Orders     None         Davonna Belling, MD 10/11/21 [phone]

## 2021-10-11 NOTE — ED Notes (Signed)
Patient transported to CT 

## 2021-10-11 NOTE — ED Triage Notes (Signed)
SOB and Afib X 4 days states feels the best today. PCP called with abnormal labs told to come to ED for re-eval.

## 2021-10-15 ENCOUNTER — Telehealth: Payer: Self-pay | Admitting: General Practice

## 2021-10-15 DIAGNOSIS — S299XXA Unspecified injury of thorax, initial encounter: Secondary | ICD-10-CM | POA: Diagnosis not present

## 2021-10-15 DIAGNOSIS — M47814 Spondylosis without myelopathy or radiculopathy, thoracic region: Secondary | ICD-10-CM | POA: Diagnosis not present

## 2021-10-15 DIAGNOSIS — S20212A Contusion of left front wall of thorax, initial encounter: Secondary | ICD-10-CM | POA: Diagnosis not present

## 2021-10-15 DIAGNOSIS — M5134 Other intervertebral disc degeneration, thoracic region: Secondary | ICD-10-CM | POA: Diagnosis not present

## 2021-10-15 NOTE — Telephone Encounter (Signed)
Transition Care Management Unsuccessful Follow-up Telephone Call  Date of discharge and from where:  10/11/21 from [facility]  Attempts:  1st Attempt  Reason for unsuccessful TCM follow-up call:  Left voice message

## 2021-10-16 ENCOUNTER — Telehealth: Payer: Self-pay | Admitting: General Practice

## 2021-10-16 NOTE — Telephone Encounter (Signed)
Transition Care Management Follow-up Telephone Call Date of discharge and from where: 10/15/21 from [facility] How have you been since you were released from the hospital? Patient is still in a lot of pain in his back. He said that he had a fall and loss of consciousness but no scan was done at the hospital. He sounds concerned about his pain. Any questions or concerns? No  Items Reviewed: Did the pt receive and understand the discharge instructions provided? Yes  Medications obtained and verified? No  Other? No  Any new allergies since your discharge? No  Dietary orders reviewed? Yes Do you have support at home? Yes   Home Care and Equipment/Supplies: Were home health services ordered? no  Functional Questionnaire: (I = Independent and D = Dependent) ADLs: I  Bathing/Dressing- I  Meal Prep- I  Eating- I  Maintaining continence- I  Transferring/Ambulation- I  Managing Meds- I  Follow up appointments reviewed:  PCP Hospital f/u appt confirmed? Yes  Scheduled to see Dr. Linford Arnold on 10/18/21 @ 1540. Specialist Hospital f/u appt confirmed? No   Are transportation arrangements needed? No  If their condition worsens, is the pt aware to call PCP or go to the Emergency Dept.? Yes Was the patient provided with contact information for the PCP's office or ED? Yes Was to pt encouraged to call back with questions or concerns? Yes

## 2021-10-16 NOTE — Telephone Encounter (Signed)
Transition Care Management Follow-up Telephone Call Date of discharge and from where: 10/11/21 from [facility] How have you been since you were released from the hospital? Patient stated he is doing much better. However, he has had a fall and had to go back to the hospital yesterday. Any questions or concerns? No  Items Reviewed: Did the pt receive and understand the discharge instructions provided? Yes  Medications obtained and verified? No  Other? No  Any new allergies since your discharge? No  Dietary orders reviewed? Yes Do you have support at home? Yes   Home Care and Equipment/Supplies: Were home health services ordered? no  Functional Questionnaire: (I = Independent and D = Dependent) ADLs: I  Bathing/Dressing- I  Meal Prep- I  Eating- I  Maintaining continence- I  Transferring/Ambulation- I  Managing Meds- I  Follow up appointments reviewed:  PCP Hospital f/u appt confirmed? Yes  Scheduled to see Dr. Linford Arnold  on 07/13/3 @ 1540. Specialist Hospital f/u appt confirmed? No   Are transportation arrangements needed? No  If their condition worsens, is the pt aware to call PCP or go to the Emergency Dept.? Yes Was the patient provided with contact information for the PCP's office or ED? Yes Was to pt encouraged to call back with questions or concerns? Yes

## 2021-10-16 NOTE — Telephone Encounter (Signed)
Transition Care Management Unsuccessful Follow-up Telephone Call  Date of discharge and from where:  10/11/21 from [facility]  Attempts:  2nd Attempt  Reason for unsuccessful TCM follow-up call:  Left voice message

## 2021-10-18 ENCOUNTER — Ambulatory Visit (INDEPENDENT_AMBULATORY_CARE_PROVIDER_SITE_OTHER): Payer: 59 | Admitting: Family Medicine

## 2021-10-18 ENCOUNTER — Encounter: Payer: Self-pay | Admitting: Family Medicine

## 2021-10-18 ENCOUNTER — Ambulatory Visit (INDEPENDENT_AMBULATORY_CARE_PROVIDER_SITE_OTHER): Payer: 59

## 2021-10-18 VITALS — BP 136/85 | HR 68 | Ht 68.5 in | Wt 333.0 lb

## 2021-10-18 DIAGNOSIS — M546 Pain in thoracic spine: Secondary | ICD-10-CM

## 2021-10-18 DIAGNOSIS — S0990XA Unspecified injury of head, initial encounter: Secondary | ICD-10-CM | POA: Diagnosis not present

## 2021-10-18 DIAGNOSIS — R109 Unspecified abdominal pain: Secondary | ICD-10-CM

## 2021-10-18 DIAGNOSIS — M25552 Pain in left hip: Secondary | ICD-10-CM | POA: Diagnosis not present

## 2021-10-18 DIAGNOSIS — I10 Essential (primary) hypertension: Secondary | ICD-10-CM | POA: Diagnosis not present

## 2021-10-18 DIAGNOSIS — E291 Testicular hypofunction: Secondary | ICD-10-CM

## 2021-10-18 DIAGNOSIS — E118 Type 2 diabetes mellitus with unspecified complications: Secondary | ICD-10-CM

## 2021-10-18 DIAGNOSIS — Z794 Long term (current) use of insulin: Secondary | ICD-10-CM

## 2021-10-18 DIAGNOSIS — N179 Acute kidney failure, unspecified: Secondary | ICD-10-CM

## 2021-10-18 DIAGNOSIS — M7062 Trochanteric bursitis, left hip: Secondary | ICD-10-CM | POA: Diagnosis not present

## 2021-10-18 DIAGNOSIS — M545 Low back pain, unspecified: Secondary | ICD-10-CM | POA: Diagnosis not present

## 2021-10-18 MED ORDER — MELOXICAM 15 MG PO TABS: 15.0000 mg | ORAL_TABLET | Freq: Every day | ORAL | 1 refills | Status: AC

## 2021-10-18 MED ORDER — MELOXICAM 15 MG PO TABS: 15.0000 mg | ORAL_TABLET | Freq: Every day | ORAL | 1 refills | Status: DC

## 2021-10-18 NOTE — Addendum Note (Signed)
Addended by: Nani Gasser D on: 10/18/2021 05:37 PM   Modules accepted: Orders

## 2021-10-18 NOTE — Assessment & Plan Note (Signed)
Looks good today 

## 2021-10-18 NOTE — Assessment & Plan Note (Signed)
I am quite concerned that his renal function is still elevated but he was a little dehydrated that day.  He says he has been drinking a lot of water since then and blood pressure looks good today.  So really like for him to get his blood work either tomorrow or Monday.  He was seen late in the day today so we were unable to draw it today.

## 2021-10-18 NOTE — Progress Notes (Signed)
Pt reports that he is in a lot of pain. He was seen by his pain doctor on Sunday but was back at the ED on Monday.   He states that he is having trouble breathing especially if he attempts to take a deep breath.   Pt also mentioned that he hasn't been taking the Adderall 30 mg x 3-4 months due to shortage, Victoza x 4-5 months (PA issues?), said that Dr. Karie Schwalbe told him to stop the Lisinopril because of the A-fib and his BP dropping.

## 2021-10-18 NOTE — Progress Notes (Addendum)
Established Patient Office Visit  Subjective   Patient ID: Rodney Bright, male    DOB: [birth year]  Age: 61 y.o. MRN: [id number]  Chief Complaint  Patient presents with   Hospitalization Follow-up    HPI  He is here today for hospital follow-up.  He was seen initially in the emergency department on May 15 after fever for 4 days and sudden onset rib pain.  He ended up being diagnosed with right middle lobe pneumonia.  Blood cell count was elevated at 13,000.  He was discharged home on amoxicillin and they did recommend follow-up chest x-ray to make sure that the pneumonia had cleared to rule out underlying malignancy.  Went back to the emergency department on July 6 for increased shortness of breath.  He had tried to stop his narcotics.  They felt like some of his symptoms were probably related to the withdrawal though he did have an elevated D-dimer and was hypotensive.  He was given IV fluids.  Slight bump in serum creatinine as well.  Negative CT angio to rule out pulmonary embolism and no residual pneumonia noted.  He was discharged home.  He then fell at home, tripping over his cat, on July 9th and went to the emergency room the following day (10th)  complaining of left-sided rib pain and back pain.  He did hit his head as well. Significant rib fracture was ruled out and he was discharged home.  He says after he fell he did have loss of consciousness for probably about 30 minutes.  He was concerned that they did not scan his head.  Diabetes - no hypoglycemic events. No wounds or sores that are not healing well. No increased thirst or urination. Checking glucose at home. Taking medications as prescribed without any side effects.  Follow-up acute kidney injury-last serum creatinine 7 days ago it was 2.0 which is up from his baseline of 1.6.  Off adderral right now bc of backorder.    ROS    Objective:     BP 136/85   Pulse 68   Ht 5' 8.5" (1.74 m)   Wt (!) 333 lb (151 kg)    SpO2 94%   BMI 49.90 kg/m    Physical Exam Constitutional:      Appearance: He is well-developed.  HENT:     Head: Normocephalic and atraumatic.  Cardiovascular:     Rate and Rhythm: Normal rate and regular rhythm.     Heart sounds: Normal heart sounds.  Pulmonary:     Effort: Pulmonary effort is normal.     Breath sounds: Normal breath sounds.  Skin:    General: Skin is warm and dry.     Comments: He does have a large bruise on his left low back just at the bottom edge of the ribs and above the hip.  He is tender over the lower thoracic and lumbar spine.  There is a small cut on the skin in the middle of the bruise.  Neurological:     Mental Status: He is alert and oriented to person, place, and time.  Psychiatric:        Behavior: Behavior normal.      No results found for any visits on 10/18/21.    The 10-year ASCVD risk score (Arnett DK, et al., 2019) is: 20.9%    Assessment & Plan:   Problem List Items Addressed This Visit       Cardiovascular and Mediastinum   ESSENTIAL HYPERTENSION, BENIGN  Looks good today.          Endocrine   Hypogonadism in male    Would like to have checked since going to lab tomorrow.       Relevant Orders   Testosterone Total,Free,Bio, Males   Controlled diabetes mellitus type 2 with complications (HCC)     Genitourinary   AKI (acute kidney injury) (HCC) - Primary    I am quite concerned that his renal function is still elevated but he was a little dehydrated that day.  He says he has been drinking a lot of water since then and blood pressure looks good today.  So really like for him to get his blood work either tomorrow or Monday.  He was seen late in the day today so we were unable to draw it today.      Relevant Orders   Hepatitis C antibody   HIV Antibody (routine testing w rflx)   Urine Microscopic   Microalbumin / creatinine urine ratio   Protein Electrophoresis, with Total Protein and Reflex to IFE, Urine    Protein electrophoresis, serum   ANA   C-reactive protein   Sedimentation rate   CBC with Differential/Platelet   Other Visit Diagnoses     Lumbar pain       Relevant Medications   meloxicam (MOBIC) 15 MG tablet   Other Relevant Orders   DG Lumbar Spine Complete   DG Thoracic Spine 2 View   Left flank pain       Relevant Orders   DG Lumbar Spine Complete   DG Thoracic Spine 2 View   Injury of head, initial encounter          Left-sided low back pain with radiculopathy to the navel-he does have a contusion just over the soft area between the ribs and the hip bone and distal rib area.  We discussed getting repeat films of his thoracic and lumbar spine today especially with the radicular pain.  We will for pain control he is currently on oxycodone and he has been taking his gabapentin at night he says that he feels like the gabapentin is more helpful than the Lyrica.  He says it does help him at least get some rest but he is in severe pain.  We discussed adding an anti-inflammatory to his regimen.  He also still has quite a bit of the pregabalin left so we discussed taking that first thing in the morning and then taking his gabapentin at night for extra pain control and if he still feels that his pain is uncontrolled encouraged him to call his pain management provider and let them know.  They should be able to see the ED notes and our notes today.  And if he still continues to have severe pain then he may need to return to the emergency department.  Shortness of breath-it is really shortness of breath with a deep breath.  This is not a typical for a possible rib fracture.  We discussed using heat or ice whichever feels better.   Return if symptoms worsen or fail to improve.   I spent 45 minutes on the day of the encounter to include pre-visit record review, face-to-face time with the patient and post visit ordering of test.   Nani Gasser, MD

## 2021-10-18 NOTE — Assessment & Plan Note (Signed)
Would like to have checked since going to lab tomorrow.

## 2021-10-22 NOTE — Progress Notes (Signed)
Bill, no sign of fracture in the low back again just a lot of arthritis and degenerative disc disease.  Hopefully you are starting to feel some better.

## 2021-10-22 NOTE — Progress Notes (Signed)
No sign of fracture in the upper back just a lot of arthritis, and degenerative disks

## 2021-11-06 ENCOUNTER — Telehealth: Payer: Self-pay

## 2021-11-06 NOTE — Telephone Encounter (Signed)
Routing to covering provider -   Paramedic Morrie Sheldon with [facility] called after hours requesting to speak with patient's provider. Per Paramedic, she needs patient's death certificate signed. Contact info is [phone].

## 2021-11-06 NOTE — Telephone Encounter (Signed)
Yes I got a call in the middle of the night for this, I just certified his death on [address]

## 2021-11-06 DEATH — deceased

## 2021-11-07 NOTE — Telephone Encounter (Signed)
Was not able to actually change the certified time of death on death certificate however I did add an addendum to the comments section on the death certificate addending the time of death at 515PM.

## 2021-11-07 NOTE — Telephone Encounter (Signed)
Harriett Sine with [facility] called to request the time of death on the certificate be changed to 515p which is when the EMS pronounced.

## 2021-11-08 NOTE — Telephone Encounter (Signed)
Spoke with Harriett Sine, she stated that it would need to be uncertified, change the time, then recertify the certificate.

## 2021-11-09 NOTE — Telephone Encounter (Signed)
All done

## 2021-11-12 NOTE — Telephone Encounter (Signed)
Fixed.

## 2021-11-12 NOTE — Telephone Encounter (Signed)
Per Rodney Bright at [facility], the word decades is spelled wrong and it must be changed before they can submit the death certificate.

## 2021-12-01 ENCOUNTER — Other Ambulatory Visit: Payer: Self-pay | Admitting: Cardiology

## 2021-12-12 ENCOUNTER — Encounter: Payer: 59 | Admitting: Family Medicine

## 2022-01-25 ENCOUNTER — Telehealth (HOSPITAL_COMMUNITY): Payer: 59 | Admitting: Psychiatry
